# Patient Record
Sex: Male | Born: 1960
Health system: Southern US, Community
[De-identification: ages and names within clinical notes are randomized; demographics above are authoritative.]

## PROBLEM LIST (undated history)

## (undated) DIAGNOSIS — I2699 Other pulmonary embolism without acute cor pulmonale: Secondary | ICD-10-CM

## (undated) DIAGNOSIS — E119 Type 2 diabetes mellitus without complications: Secondary | ICD-10-CM

## (undated) DIAGNOSIS — M179 Osteoarthritis of knee, unspecified: Secondary | ICD-10-CM

## (undated) DIAGNOSIS — M1710 Unilateral primary osteoarthritis, unspecified knee: Secondary | ICD-10-CM

## (undated) DIAGNOSIS — Z8614 Personal history of Methicillin resistant Staphylococcus aureus infection: Secondary | ICD-10-CM

## (undated) DIAGNOSIS — I10 Essential (primary) hypertension: Secondary | ICD-10-CM

## (undated) DIAGNOSIS — R143 Flatulence: Secondary | ICD-10-CM

## (undated) DIAGNOSIS — K219 Gastro-esophageal reflux disease without esophagitis: Secondary | ICD-10-CM

## (undated) DIAGNOSIS — B20 Human immunodeficiency virus [HIV] disease: Secondary | ICD-10-CM

## (undated) DIAGNOSIS — E785 Hyperlipidemia, unspecified: Secondary | ICD-10-CM

## (undated) DIAGNOSIS — I809 Phlebitis and thrombophlebitis of unspecified site: Secondary | ICD-10-CM

## (undated) DIAGNOSIS — Z86718 Personal history of other venous thrombosis and embolism: Secondary | ICD-10-CM

## (undated) DIAGNOSIS — Z21 Asymptomatic human immunodeficiency virus [HIV] infection status: Secondary | ICD-10-CM

## (undated) DIAGNOSIS — I82409 Acute embolism and thrombosis of unspecified deep veins of unspecified lower extremity: Secondary | ICD-10-CM

## (undated) HISTORY — DX: Unilateral primary osteoarthritis, unspecified knee: M17.10

## (undated) HISTORY — PX: ENDOVENOUS ABLATION SAPHENOUS VEIN W/ LASER: SUR449

## (undated) HISTORY — DX: Osteoarthritis of knee, unspecified: M17.9

## (undated) HISTORY — DX: Flatulence: R14.3

## (undated) HISTORY — DX: Acute embolism and thrombosis of unspecified deep veins of unspecified lower extremity: I82.409

## (undated) HISTORY — DX: Asymptomatic human immunodeficiency virus (hiv) infection status: Z21

## (undated) HISTORY — DX: Hyperlipidemia, unspecified: E78.5

## (undated) HISTORY — DX: Essential (primary) hypertension: I10

## (undated) HISTORY — DX: Personal history of Methicillin resistant Staphylococcus aureus infection: Z86.14

## (undated) HISTORY — DX: Personal history of other venous thrombosis and embolism: Z86.718

## (undated) HISTORY — PX: VEIN LIGATION AND STRIPPING: SHX2653

## (undated) HISTORY — DX: Gastro-esophageal reflux disease without esophagitis: K21.9

## (undated) HISTORY — DX: Human immunodeficiency virus (HIV) disease: B20

## (undated) HISTORY — DX: Phlebitis and thrombophlebitis of unspecified site: I80.9

---

## 1996-11-02 ENCOUNTER — Encounter (INDEPENDENT_AMBULATORY_CARE_PROVIDER_SITE_OTHER): Payer: Self-pay | Admitting: *Deleted

## 1996-11-02 LAB — CONVERTED CEMR LAB
CD4 Count: 180 microliters
CD4 T Cell Abs: 180

## 1996-12-03 ENCOUNTER — Encounter (INDEPENDENT_AMBULATORY_CARE_PROVIDER_SITE_OTHER): Payer: Self-pay | Admitting: Infectious Diseases

## 1998-02-01 ENCOUNTER — Encounter: Admission: RE | Admit: 1998-02-01 | Discharge: 1998-02-01 | Payer: Self-pay | Admitting: Infectious Diseases

## 1998-02-15 ENCOUNTER — Encounter: Admission: RE | Admit: 1998-02-15 | Discharge: 1998-02-15 | Payer: Self-pay | Admitting: Infectious Diseases

## 1998-04-14 ENCOUNTER — Encounter: Admission: RE | Admit: 1998-04-14 | Discharge: 1998-04-14 | Payer: Self-pay | Admitting: *Deleted

## 1998-05-26 ENCOUNTER — Encounter: Admission: RE | Admit: 1998-05-26 | Discharge: 1998-05-26 | Payer: Self-pay | Admitting: Infectious Diseases

## 1998-06-09 ENCOUNTER — Encounter: Admission: RE | Admit: 1998-06-09 | Discharge: 1998-06-09 | Payer: Self-pay | Admitting: Infectious Diseases

## 1998-07-19 ENCOUNTER — Ambulatory Visit (HOSPITAL_COMMUNITY): Admission: RE | Admit: 1998-07-19 | Discharge: 1998-07-19 | Payer: Self-pay | Admitting: Infectious Diseases

## 1998-07-19 ENCOUNTER — Encounter: Admission: RE | Admit: 1998-07-19 | Discharge: 1998-07-19 | Payer: Self-pay | Admitting: Infectious Diseases

## 1998-09-08 ENCOUNTER — Encounter: Admission: RE | Admit: 1998-09-08 | Discharge: 1998-09-08 | Payer: Self-pay | Admitting: Infectious Diseases

## 1998-09-22 ENCOUNTER — Encounter: Admission: RE | Admit: 1998-09-22 | Discharge: 1998-09-22 | Payer: Self-pay | Admitting: Infectious Diseases

## 1998-11-08 ENCOUNTER — Encounter: Admission: RE | Admit: 1998-11-08 | Discharge: 1998-11-08 | Payer: Self-pay | Admitting: Infectious Diseases

## 1998-12-27 ENCOUNTER — Ambulatory Visit (HOSPITAL_COMMUNITY): Admission: RE | Admit: 1998-12-27 | Discharge: 1998-12-27 | Payer: Self-pay | Admitting: Infectious Diseases

## 1999-01-10 ENCOUNTER — Encounter: Admission: RE | Admit: 1999-01-10 | Discharge: 1999-01-10 | Payer: Self-pay | Admitting: Infectious Diseases

## 1999-03-14 ENCOUNTER — Ambulatory Visit (HOSPITAL_COMMUNITY): Admission: RE | Admit: 1999-03-14 | Discharge: 1999-03-14 | Payer: Self-pay | Admitting: Hematology and Oncology

## 1999-03-28 ENCOUNTER — Encounter: Admission: RE | Admit: 1999-03-28 | Discharge: 1999-03-28 | Payer: Self-pay | Admitting: Infectious Diseases

## 1999-06-13 ENCOUNTER — Encounter: Admission: RE | Admit: 1999-06-13 | Discharge: 1999-06-13 | Payer: Self-pay | Admitting: Internal Medicine

## 1999-06-13 ENCOUNTER — Ambulatory Visit (HOSPITAL_COMMUNITY): Admission: RE | Admit: 1999-06-13 | Discharge: 1999-06-13 | Payer: Self-pay | Admitting: Infectious Diseases

## 1999-06-27 ENCOUNTER — Encounter: Admission: RE | Admit: 1999-06-27 | Discharge: 1999-06-27 | Payer: Self-pay | Admitting: Infectious Diseases

## 1999-09-05 ENCOUNTER — Ambulatory Visit (HOSPITAL_COMMUNITY): Admission: RE | Admit: 1999-09-05 | Discharge: 1999-09-05 | Payer: Self-pay | Admitting: Infectious Diseases

## 1999-09-05 ENCOUNTER — Encounter: Admission: RE | Admit: 1999-09-05 | Discharge: 1999-09-05 | Payer: Self-pay | Admitting: Infectious Diseases

## 1999-09-21 ENCOUNTER — Encounter: Admission: RE | Admit: 1999-09-21 | Discharge: 1999-09-21 | Payer: Self-pay | Admitting: Infectious Diseases

## 1999-12-02 ENCOUNTER — Ambulatory Visit (HOSPITAL_COMMUNITY): Admission: RE | Admit: 1999-12-02 | Discharge: 1999-12-02 | Payer: Self-pay | Admitting: Infectious Diseases

## 1999-12-02 ENCOUNTER — Encounter: Admission: RE | Admit: 1999-12-02 | Discharge: 1999-12-02 | Payer: Self-pay | Admitting: Infectious Diseases

## 1999-12-19 ENCOUNTER — Encounter: Admission: RE | Admit: 1999-12-19 | Discharge: 1999-12-19 | Payer: Self-pay | Admitting: Infectious Diseases

## 2000-01-04 ENCOUNTER — Encounter: Admission: RE | Admit: 2000-01-04 | Discharge: 2000-01-04 | Payer: Self-pay | Admitting: Infectious Diseases

## 2000-02-06 ENCOUNTER — Ambulatory Visit (HOSPITAL_COMMUNITY): Admission: RE | Admit: 2000-02-06 | Discharge: 2000-02-06 | Payer: Self-pay | Admitting: Infectious Diseases

## 2000-02-20 ENCOUNTER — Encounter: Admission: RE | Admit: 2000-02-20 | Discharge: 2000-02-20 | Payer: Self-pay | Admitting: Infectious Diseases

## 2000-05-11 ENCOUNTER — Ambulatory Visit (HOSPITAL_COMMUNITY): Admission: RE | Admit: 2000-05-11 | Discharge: 2000-05-11 | Payer: Self-pay | Admitting: Infectious Diseases

## 2000-05-11 ENCOUNTER — Encounter: Admission: RE | Admit: 2000-05-11 | Discharge: 2000-05-11 | Payer: Self-pay | Admitting: Infectious Diseases

## 2000-05-28 ENCOUNTER — Encounter: Admission: RE | Admit: 2000-05-28 | Discharge: 2000-05-28 | Payer: Self-pay | Admitting: Infectious Diseases

## 2000-08-06 ENCOUNTER — Ambulatory Visit (HOSPITAL_COMMUNITY): Admission: RE | Admit: 2000-08-06 | Discharge: 2000-08-06 | Payer: Self-pay | Admitting: Infectious Diseases

## 2000-08-06 ENCOUNTER — Encounter: Admission: RE | Admit: 2000-08-06 | Discharge: 2000-08-06 | Payer: Self-pay | Admitting: Infectious Diseases

## 2000-08-20 ENCOUNTER — Encounter: Admission: RE | Admit: 2000-08-20 | Discharge: 2000-08-20 | Payer: Self-pay | Admitting: Infectious Diseases

## 2000-12-24 ENCOUNTER — Ambulatory Visit (HOSPITAL_COMMUNITY): Admission: RE | Admit: 2000-12-24 | Discharge: 2000-12-24 | Payer: Self-pay | Admitting: Infectious Diseases

## 2000-12-24 ENCOUNTER — Encounter: Admission: RE | Admit: 2000-12-24 | Discharge: 2000-12-24 | Payer: Self-pay | Admitting: Infectious Diseases

## 2001-01-14 ENCOUNTER — Encounter: Admission: RE | Admit: 2001-01-14 | Discharge: 2001-01-14 | Payer: Self-pay | Admitting: Infectious Diseases

## 2001-05-20 ENCOUNTER — Encounter: Admission: RE | Admit: 2001-05-20 | Discharge: 2001-05-20 | Payer: Self-pay | Admitting: Infectious Diseases

## 2001-05-20 ENCOUNTER — Ambulatory Visit (HOSPITAL_COMMUNITY): Admission: RE | Admit: 2001-05-20 | Discharge: 2001-05-20 | Payer: Self-pay | Admitting: Infectious Diseases

## 2001-06-10 ENCOUNTER — Encounter: Admission: RE | Admit: 2001-06-10 | Discharge: 2001-06-10 | Payer: Self-pay | Admitting: Infectious Diseases

## 2001-09-03 ENCOUNTER — Ambulatory Visit (HOSPITAL_COMMUNITY): Admission: RE | Admit: 2001-09-03 | Discharge: 2001-09-03 | Payer: Self-pay | Admitting: Infectious Diseases

## 2001-09-20 ENCOUNTER — Encounter: Admission: RE | Admit: 2001-09-20 | Discharge: 2001-09-20 | Payer: Self-pay | Admitting: Infectious Diseases

## 2001-12-10 ENCOUNTER — Encounter: Admission: RE | Admit: 2001-12-10 | Discharge: 2001-12-10 | Payer: Self-pay | Admitting: Internal Medicine

## 2001-12-10 ENCOUNTER — Ambulatory Visit (HOSPITAL_COMMUNITY): Admission: RE | Admit: 2001-12-10 | Discharge: 2001-12-10 | Payer: Self-pay | Admitting: Infectious Diseases

## 2001-12-23 ENCOUNTER — Encounter: Admission: RE | Admit: 2001-12-23 | Discharge: 2001-12-23 | Payer: Self-pay | Admitting: Infectious Diseases

## 2002-02-28 ENCOUNTER — Encounter: Admission: RE | Admit: 2002-02-28 | Discharge: 2002-02-28 | Payer: Self-pay | Admitting: Infectious Diseases

## 2002-03-28 ENCOUNTER — Ambulatory Visit (HOSPITAL_COMMUNITY): Admission: RE | Admit: 2002-03-28 | Discharge: 2002-03-28 | Payer: Self-pay | Admitting: Infectious Diseases

## 2002-03-28 ENCOUNTER — Encounter: Admission: RE | Admit: 2002-03-28 | Discharge: 2002-03-28 | Payer: Self-pay | Admitting: Infectious Diseases

## 2002-04-21 ENCOUNTER — Encounter: Admission: RE | Admit: 2002-04-21 | Discharge: 2002-04-21 | Payer: Self-pay | Admitting: Infectious Diseases

## 2002-07-14 ENCOUNTER — Ambulatory Visit (HOSPITAL_COMMUNITY): Admission: RE | Admit: 2002-07-14 | Discharge: 2002-07-14 | Payer: Self-pay | Admitting: Infectious Diseases

## 2002-07-14 ENCOUNTER — Encounter: Admission: RE | Admit: 2002-07-14 | Discharge: 2002-07-14 | Payer: Self-pay | Admitting: Infectious Diseases

## 2002-07-28 ENCOUNTER — Encounter: Admission: RE | Admit: 2002-07-28 | Discharge: 2002-07-28 | Payer: Self-pay | Admitting: Infectious Diseases

## 2002-11-18 ENCOUNTER — Encounter: Admission: RE | Admit: 2002-11-18 | Discharge: 2002-11-18 | Payer: Self-pay | Admitting: Infectious Diseases

## 2002-12-01 ENCOUNTER — Encounter: Admission: RE | Admit: 2002-12-01 | Discharge: 2002-12-01 | Payer: Self-pay | Admitting: Infectious Diseases

## 2003-03-31 ENCOUNTER — Ambulatory Visit (HOSPITAL_COMMUNITY): Admission: RE | Admit: 2003-03-31 | Discharge: 2003-03-31 | Payer: Self-pay | Admitting: Infectious Diseases

## 2003-03-31 ENCOUNTER — Encounter (INDEPENDENT_AMBULATORY_CARE_PROVIDER_SITE_OTHER): Payer: Self-pay | Admitting: Infectious Diseases

## 2003-03-31 ENCOUNTER — Encounter: Admission: RE | Admit: 2003-03-31 | Discharge: 2003-03-31 | Payer: Self-pay | Admitting: Infectious Diseases

## 2003-04-15 ENCOUNTER — Encounter: Admission: RE | Admit: 2003-04-15 | Discharge: 2003-04-15 | Payer: Self-pay | Admitting: Infectious Diseases

## 2003-07-14 ENCOUNTER — Ambulatory Visit (HOSPITAL_COMMUNITY): Admission: RE | Admit: 2003-07-14 | Discharge: 2003-07-14 | Payer: Self-pay | Admitting: Infectious Diseases

## 2003-07-14 ENCOUNTER — Encounter: Admission: RE | Admit: 2003-07-14 | Discharge: 2003-07-14 | Payer: Self-pay | Admitting: Infectious Diseases

## 2003-07-14 ENCOUNTER — Encounter (INDEPENDENT_AMBULATORY_CARE_PROVIDER_SITE_OTHER): Payer: Self-pay | Admitting: Infectious Diseases

## 2003-07-28 ENCOUNTER — Encounter: Admission: RE | Admit: 2003-07-28 | Discharge: 2003-07-28 | Payer: Self-pay | Admitting: Infectious Diseases

## 2003-10-26 ENCOUNTER — Encounter: Admission: RE | Admit: 2003-10-26 | Discharge: 2003-10-26 | Payer: Self-pay | Admitting: Infectious Diseases

## 2003-11-16 ENCOUNTER — Encounter: Admission: RE | Admit: 2003-11-16 | Discharge: 2003-11-16 | Payer: Self-pay | Admitting: Infectious Diseases

## 2004-02-18 ENCOUNTER — Encounter: Admission: RE | Admit: 2004-02-18 | Discharge: 2004-02-18 | Payer: Self-pay

## 2004-03-15 ENCOUNTER — Ambulatory Visit (HOSPITAL_COMMUNITY): Admission: RE | Admit: 2004-03-15 | Discharge: 2004-03-15 | Payer: Self-pay | Admitting: Infectious Diseases

## 2004-03-15 ENCOUNTER — Encounter: Admission: RE | Admit: 2004-03-15 | Discharge: 2004-03-15 | Payer: Self-pay | Admitting: Infectious Diseases

## 2004-04-11 ENCOUNTER — Encounter: Admission: RE | Admit: 2004-04-11 | Discharge: 2004-04-11 | Payer: Self-pay | Admitting: Infectious Diseases

## 2004-04-14 ENCOUNTER — Encounter: Admission: RE | Admit: 2004-04-14 | Discharge: 2004-04-14 | Payer: Self-pay | Admitting: Infectious Diseases

## 2004-04-21 ENCOUNTER — Encounter: Admission: RE | Admit: 2004-04-21 | Discharge: 2004-04-21 | Payer: Self-pay | Admitting: Diagnostic Radiology

## 2004-05-12 ENCOUNTER — Encounter: Admission: RE | Admit: 2004-05-12 | Discharge: 2004-05-12 | Payer: Self-pay | Admitting: Diagnostic Radiology

## 2004-07-14 ENCOUNTER — Encounter: Admission: RE | Admit: 2004-07-14 | Discharge: 2004-07-14 | Payer: Self-pay

## 2004-08-09 ENCOUNTER — Ambulatory Visit: Payer: Self-pay | Admitting: Infectious Diseases

## 2004-08-09 ENCOUNTER — Ambulatory Visit (HOSPITAL_COMMUNITY): Admission: RE | Admit: 2004-08-09 | Discharge: 2004-08-09 | Payer: Self-pay | Admitting: Infectious Diseases

## 2004-09-07 ENCOUNTER — Ambulatory Visit: Payer: Self-pay | Admitting: Infectious Diseases

## 2004-11-03 ENCOUNTER — Encounter: Admission: RE | Admit: 2004-11-03 | Discharge: 2004-11-03 | Payer: Self-pay

## 2005-01-12 ENCOUNTER — Ambulatory Visit (HOSPITAL_COMMUNITY): Admission: RE | Admit: 2005-01-12 | Discharge: 2005-01-12 | Payer: Self-pay | Admitting: Infectious Diseases

## 2005-01-12 ENCOUNTER — Ambulatory Visit: Payer: Self-pay | Admitting: Infectious Diseases

## 2005-01-30 ENCOUNTER — Ambulatory Visit: Payer: Self-pay | Admitting: Infectious Diseases

## 2005-05-01 ENCOUNTER — Ambulatory Visit (HOSPITAL_COMMUNITY): Admission: RE | Admit: 2005-05-01 | Discharge: 2005-05-01 | Payer: Self-pay | Admitting: Infectious Diseases

## 2005-05-01 ENCOUNTER — Ambulatory Visit: Payer: Self-pay | Admitting: Infectious Diseases

## 2005-05-29 ENCOUNTER — Ambulatory Visit: Payer: Self-pay | Admitting: Infectious Diseases

## 2005-08-28 ENCOUNTER — Ambulatory Visit (HOSPITAL_COMMUNITY): Admission: RE | Admit: 2005-08-28 | Discharge: 2005-08-28 | Payer: Self-pay | Admitting: Infectious Diseases

## 2005-08-28 ENCOUNTER — Encounter (INDEPENDENT_AMBULATORY_CARE_PROVIDER_SITE_OTHER): Payer: Self-pay | Admitting: *Deleted

## 2005-08-28 ENCOUNTER — Ambulatory Visit: Payer: Self-pay | Admitting: Infectious Diseases

## 2005-08-28 LAB — CONVERTED CEMR LAB: CD4 Count: 560 microliters

## 2005-09-04 ENCOUNTER — Ambulatory Visit: Payer: Self-pay | Admitting: Infectious Diseases

## 2005-12-15 ENCOUNTER — Encounter (INDEPENDENT_AMBULATORY_CARE_PROVIDER_SITE_OTHER): Payer: Self-pay | Admitting: *Deleted

## 2005-12-15 ENCOUNTER — Ambulatory Visit: Payer: Self-pay | Admitting: Infectious Diseases

## 2005-12-15 ENCOUNTER — Encounter: Admission: RE | Admit: 2005-12-15 | Discharge: 2005-12-15 | Payer: Self-pay | Admitting: Infectious Diseases

## 2005-12-15 LAB — CONVERTED CEMR LAB
CD4 Count: 540 microliters
HIV 1 RNA Quant: 49 copies/mL

## 2006-01-01 ENCOUNTER — Ambulatory Visit: Payer: Self-pay | Admitting: Infectious Diseases

## 2006-05-22 ENCOUNTER — Encounter (INDEPENDENT_AMBULATORY_CARE_PROVIDER_SITE_OTHER): Payer: Self-pay | Admitting: *Deleted

## 2006-05-22 ENCOUNTER — Encounter: Admission: RE | Admit: 2006-05-22 | Discharge: 2006-05-22 | Payer: Self-pay | Admitting: Infectious Diseases

## 2006-05-22 ENCOUNTER — Ambulatory Visit: Payer: Self-pay | Admitting: Infectious Diseases

## 2006-05-22 LAB — CONVERTED CEMR LAB: HIV 1 RNA Quant: 58 copies/mL

## 2006-06-11 ENCOUNTER — Ambulatory Visit: Payer: Self-pay | Admitting: Infectious Diseases

## 2006-09-15 ENCOUNTER — Emergency Department (HOSPITAL_COMMUNITY): Admission: EM | Admit: 2006-09-15 | Discharge: 2006-09-15 | Payer: Self-pay | Admitting: Emergency Medicine

## 2006-09-18 ENCOUNTER — Ambulatory Visit: Payer: Self-pay | Admitting: Infectious Diseases

## 2006-10-01 ENCOUNTER — Encounter: Admission: RE | Admit: 2006-10-01 | Discharge: 2006-10-01 | Payer: Self-pay | Admitting: Infectious Diseases

## 2006-10-01 ENCOUNTER — Encounter (INDEPENDENT_AMBULATORY_CARE_PROVIDER_SITE_OTHER): Payer: Self-pay | Admitting: *Deleted

## 2006-10-01 ENCOUNTER — Encounter (INDEPENDENT_AMBULATORY_CARE_PROVIDER_SITE_OTHER): Payer: Self-pay | Admitting: Infectious Diseases

## 2006-10-01 ENCOUNTER — Ambulatory Visit: Payer: Self-pay | Admitting: Internal Medicine

## 2006-10-01 LAB — CONVERTED CEMR LAB
CD4 Count: 640 microliters
HIV 1 RNA Quant: 49 copies/mL

## 2006-10-05 ENCOUNTER — Encounter (INDEPENDENT_AMBULATORY_CARE_PROVIDER_SITE_OTHER): Payer: Self-pay | Admitting: Infectious Diseases

## 2006-10-15 ENCOUNTER — Ambulatory Visit: Payer: Self-pay | Admitting: Infectious Diseases

## 2006-11-26 ENCOUNTER — Encounter (INDEPENDENT_AMBULATORY_CARE_PROVIDER_SITE_OTHER): Payer: Self-pay | Admitting: *Deleted

## 2006-11-26 LAB — CONVERTED CEMR LAB

## 2006-12-09 ENCOUNTER — Encounter (INDEPENDENT_AMBULATORY_CARE_PROVIDER_SITE_OTHER): Payer: Self-pay | Admitting: *Deleted

## 2007-01-31 ENCOUNTER — Ambulatory Visit: Payer: Self-pay | Admitting: Infectious Diseases

## 2007-01-31 ENCOUNTER — Encounter: Admission: RE | Admit: 2007-01-31 | Discharge: 2007-01-31 | Payer: Self-pay | Admitting: Infectious Diseases

## 2007-01-31 LAB — CONVERTED CEMR LAB
ALT: 46 units/L (ref 0–53)
AST: 35 units/L (ref 0–37)
Albumin: 4.3 g/dL (ref 3.5–5.2)
Alkaline Phosphatase: 137 units/L — ABNORMAL HIGH (ref 39–117)
BUN: 18 mg/dL (ref 6–23)
Basophils Absolute: 0 10*3/uL (ref 0.0–0.1)
Basophils Relative: 0 % (ref 0–1)
Bilirubin Urine: NEGATIVE
CD4 Count: 760 microliters
CO2: 27 meq/L (ref 19–32)
Calcium: 9 mg/dL (ref 8.4–10.5)
Chloride: 103 meq/L (ref 96–112)
Cholesterol: 197 mg/dL (ref 0–200)
Creatinine, Ser: 0.97 mg/dL (ref 0.40–1.50)
Eosinophils Absolute: 0.2 10*3/uL (ref 0.0–0.7)
Eosinophils Relative: 3 % (ref 0–5)
Glucose, Bld: 102 mg/dL — ABNORMAL HIGH (ref 70–99)
HCT: 42.1 % (ref 39.0–52.0)
HDL: 40 mg/dL (ref >39)
HIV 1 RNA Quant: <50 copies/mL (ref <50)
HIV-1 RNA Quant, Log: <1.7 (ref <1.70)
Hemoglobin, Urine: NEGATIVE
Hemoglobin: 13.9 g/dL (ref 13.0–17.0)
Ketones, ur: NEGATIVE mg/dL
LDL Cholesterol: 134 mg/dL — ABNORMAL HIGH (ref 0–99)
Leukocytes, UA: NEGATIVE
Lymphocytes Relative: 46 % (ref 12–46)
Lymphs Abs: 2.3 10*3/uL (ref 0.7–3.3)
MCHC: 33 g/dL (ref 30.0–36.0)
MCV: 84.2 fL (ref 78.0–100.0)
Monocytes Absolute: 0.3 10*3/uL (ref 0.2–0.7)
Monocytes Relative: 6 % (ref 3–11)
Neutro Abs: 2.2 10*3/uL (ref 1.7–7.7)
Neutrophils Relative %: 44 % (ref 43–77)
Nitrite: NEGATIVE
Platelets: 223 10*3/uL (ref 150–400)
Potassium: 4.2 meq/L (ref 3.5–5.3)
Protein, ur: NEGATIVE mg/dL
RBC: 5 M/uL (ref 4.22–5.81)
RDW: 13.5 % (ref 11.5–14.0)
Sodium: 139 meq/L (ref 135–145)
Specific Gravity, Urine: 1.012 (ref 1.005–1.03)
Total Bilirubin: 0.3 mg/dL (ref 0.3–1.2)
Total CHOL/HDL Ratio: 4.9
Total Protein: 6.4 g/dL (ref 6.0–8.3)
Triglycerides: 115 mg/dL (ref <150)
Urine Glucose: NEGATIVE mg/dL
Urobilinogen, UA: 0.2 (ref 0.0–1.0)
VLDL: 23 mg/dL (ref 0–40)
WBC: 5 10*3/uL (ref 4.0–10.5)
pH: 7 (ref 5.0–8.0)

## 2007-02-05 ENCOUNTER — Telehealth (INDEPENDENT_AMBULATORY_CARE_PROVIDER_SITE_OTHER): Payer: Self-pay | Admitting: Infectious Diseases

## 2007-03-01 DIAGNOSIS — K219 Gastro-esophageal reflux disease without esophagitis: Secondary | ICD-10-CM

## 2007-03-01 DIAGNOSIS — B20 Human immunodeficiency virus [HIV] disease: Secondary | ICD-10-CM

## 2007-03-01 DIAGNOSIS — I10 Essential (primary) hypertension: Secondary | ICD-10-CM

## 2007-03-04 ENCOUNTER — Ambulatory Visit: Payer: Self-pay | Admitting: Infectious Diseases

## 2007-05-16 ENCOUNTER — Telehealth: Payer: Self-pay | Admitting: Internal Medicine

## 2007-05-17 ENCOUNTER — Ambulatory Visit: Payer: Self-pay | Admitting: Internal Medicine

## 2007-05-21 ENCOUNTER — Telehealth: Payer: Self-pay | Admitting: Internal Medicine

## 2007-06-24 ENCOUNTER — Ambulatory Visit: Payer: Self-pay | Admitting: Infectious Disease

## 2007-06-24 ENCOUNTER — Encounter: Admission: RE | Admit: 2007-06-24 | Discharge: 2007-06-24 | Payer: Self-pay | Admitting: Infectious Disease

## 2007-06-24 LAB — CONVERTED CEMR LAB
ALT: 35 units/L (ref 0–53)
AST: 21 units/L (ref 0–37)
Albumin: 4.5 g/dL (ref 3.5–5.2)
Alkaline Phosphatase: 158 units/L — ABNORMAL HIGH (ref 39–117)
BUN: 20 mg/dL (ref 6–23)
Basophils Absolute: 0 10*3/uL (ref 0.0–0.1)
Basophils Relative: 1 % (ref 0–1)
CO2: 26 meq/L (ref 19–32)
Calcium: 9.4 mg/dL (ref 8.4–10.5)
Chloride: 104 meq/L (ref 96–112)
Creatinine, Ser: 0.99 mg/dL (ref 0.40–1.50)
Eosinophils Absolute: 0.1 10*3/uL (ref 0.0–0.7)
Eosinophils Relative: 3 % (ref 0–5)
Glucose, Bld: 99 mg/dL (ref 70–99)
HCT: 46.2 % (ref 39.0–52.0)
HIV 1 RNA Quant: <50 copies/mL (ref <50)
HIV-1 RNA Quant, Log: <1.7 (ref <1.70)
Hemoglobin: 15 g/dL (ref 13.0–17.0)
Lymphocytes Relative: 37 % (ref 12–46)
Lymphs Abs: 1.8 10*3/uL (ref 0.7–3.3)
MCHC: 32.5 g/dL (ref 30.0–36.0)
MCV: 85.6 fL (ref 78.0–100.0)
Monocytes Absolute: 0.4 10*3/uL (ref 0.2–0.7)
Monocytes Relative: 7 % (ref 3–11)
Neutro Abs: 2.6 10*3/uL (ref 1.7–7.7)
Neutrophils Relative %: 53 % (ref 43–77)
Platelets: 206 10*3/uL (ref 150–400)
Potassium: 4.4 meq/L (ref 3.5–5.3)
RBC: 5.4 M/uL (ref 4.22–5.81)
RDW: 13.8 % (ref 11.5–14.0)
Sodium: 141 meq/L (ref 135–145)
Total Bilirubin: 0.3 mg/dL (ref 0.3–1.2)
Total Protein: 7 g/dL (ref 6.0–8.3)
WBC: 4.9 10*3/uL (ref 4.0–10.5)

## 2007-06-26 ENCOUNTER — Encounter (INDEPENDENT_AMBULATORY_CARE_PROVIDER_SITE_OTHER): Payer: Self-pay | Admitting: Infectious Diseases

## 2007-07-03 DIAGNOSIS — Z86718 Personal history of other venous thrombosis and embolism: Secondary | ICD-10-CM

## 2007-07-03 HISTORY — DX: Personal history of other venous thrombosis and embolism: Z86.718

## 2007-07-08 ENCOUNTER — Ambulatory Visit (HOSPITAL_COMMUNITY): Admission: RE | Admit: 2007-07-08 | Discharge: 2007-07-08 | Payer: Self-pay | Admitting: Infectious Disease

## 2007-07-08 ENCOUNTER — Ambulatory Visit: Payer: Self-pay | Admitting: Infectious Disease

## 2007-07-08 ENCOUNTER — Encounter: Payer: Self-pay | Admitting: Infectious Disease

## 2007-07-08 ENCOUNTER — Ambulatory Visit: Payer: Self-pay | Admitting: *Deleted

## 2007-07-08 DIAGNOSIS — I82409 Acute embolism and thrombosis of unspecified deep veins of unspecified lower extremity: Secondary | ICD-10-CM | POA: Insufficient documentation

## 2007-07-11 ENCOUNTER — Ambulatory Visit: Payer: Self-pay | Admitting: Infectious Disease

## 2007-07-11 LAB — CONVERTED CEMR LAB: INR: 2.6

## 2007-07-12 ENCOUNTER — Telehealth: Payer: Self-pay | Admitting: Infectious Disease

## 2007-07-15 ENCOUNTER — Ambulatory Visit: Payer: Self-pay | Admitting: Infectious Diseases

## 2007-07-15 ENCOUNTER — Encounter: Payer: Self-pay | Admitting: Infectious Disease

## 2007-07-15 LAB — CONVERTED CEMR LAB
ALT: 129 units/L — ABNORMAL HIGH (ref 0–53)
AST: 47 units/L — ABNORMAL HIGH (ref 0–37)
Albumin: 4.5 g/dL (ref 3.5–5.2)
Alkaline Phosphatase: 146 units/L — ABNORMAL HIGH (ref 39–117)
BUN: 16 mg/dL (ref 6–23)
CO2: 27 meq/L (ref 19–32)
Calcium: 9.2 mg/dL (ref 8.4–10.5)
Chloride: 104 meq/L (ref 96–112)
Creatinine, Ser: 0.93 mg/dL (ref 0.40–1.50)
Glucose, Bld: 100 mg/dL — ABNORMAL HIGH (ref 70–99)
INR: 5.9
Potassium: 4.1 meq/L (ref 3.5–5.3)
Sodium: 141 meq/L (ref 135–145)
Total Bilirubin: 0.3 mg/dL (ref 0.3–1.2)
Total Protein: 7.1 g/dL (ref 6.0–8.3)

## 2007-07-22 ENCOUNTER — Ambulatory Visit: Payer: Self-pay | Admitting: Infectious Diseases

## 2007-07-22 LAB — CONVERTED CEMR LAB: INR: 2.6

## 2007-07-24 ENCOUNTER — Ambulatory Visit: Payer: Self-pay | Admitting: Infectious Disease

## 2007-07-29 ENCOUNTER — Ambulatory Visit: Payer: Self-pay | Admitting: Infectious Diseases

## 2007-07-29 LAB — CONVERTED CEMR LAB: INR: 2.6

## 2007-08-12 ENCOUNTER — Ambulatory Visit: Payer: Self-pay | Admitting: Internal Medicine

## 2007-08-26 ENCOUNTER — Ambulatory Visit: Payer: Self-pay | Admitting: Internal Medicine

## 2007-08-26 LAB — CONVERTED CEMR LAB: INR: 2.4

## 2007-09-16 ENCOUNTER — Ambulatory Visit: Payer: Self-pay | Admitting: Internal Medicine

## 2007-09-16 LAB — CONVERTED CEMR LAB: INR: 2.1

## 2007-10-02 ENCOUNTER — Encounter (INDEPENDENT_AMBULATORY_CARE_PROVIDER_SITE_OTHER): Payer: Self-pay | Admitting: *Deleted

## 2007-10-14 ENCOUNTER — Ambulatory Visit: Payer: Self-pay | Admitting: Internal Medicine

## 2007-10-14 LAB — CONVERTED CEMR LAB: INR: 4.3

## 2007-11-11 ENCOUNTER — Ambulatory Visit: Payer: Self-pay | Admitting: Hospitalist

## 2007-11-11 ENCOUNTER — Encounter: Admission: RE | Admit: 2007-11-11 | Discharge: 2007-11-11 | Payer: Self-pay | Admitting: Infectious Disease

## 2007-11-11 ENCOUNTER — Ambulatory Visit: Payer: Self-pay | Admitting: Infectious Disease

## 2007-11-11 LAB — CONVERTED CEMR LAB
ALT: 46 units/L (ref 0–53)
AST: 25 units/L (ref 0–37)
Albumin: 4.6 g/dL (ref 3.5–5.2)
Alkaline Phosphatase: 131 units/L — ABNORMAL HIGH (ref 39–117)
BUN: 25 mg/dL — ABNORMAL HIGH (ref 6–23)
Basophils Absolute: 0.1 10*3/uL (ref 0.0–0.1)
Basophils Relative: 1 % (ref 0–1)
CO2: 23 meq/L (ref 19–32)
Calcium: 9.8 mg/dL (ref 8.4–10.5)
Chloride: 104 meq/L (ref 96–112)
Cholesterol: 269 mg/dL — ABNORMAL HIGH (ref 0–200)
Creatinine, Ser: 1.1 mg/dL (ref 0.40–1.50)
Eosinophils Absolute: 0.2 10*3/uL (ref 0.0–0.7)
Eosinophils Relative: 3 % (ref 0–5)
Glucose, Bld: 174 mg/dL — ABNORMAL HIGH (ref 70–99)
HCT: 45 % (ref 39.0–52.0)
HDL: 49 mg/dL (ref >39)
HIV 1 RNA Quant: <50 copies/mL (ref <50)
HIV-1 RNA Quant, Log: <1.7 (ref <1.70)
Hemoglobin: 15 g/dL (ref 13.0–17.0)
INR: 2
LDL Cholesterol: 182 mg/dL — ABNORMAL HIGH (ref 0–99)
Lymphocytes Relative: 40 % (ref 12–46)
Lymphs Abs: 2.1 10*3/uL (ref 0.7–4.0)
MCHC: 33.3 g/dL (ref 30.0–36.0)
MCV: 82.1 fL (ref 78.0–100.0)
Monocytes Absolute: 0.5 10*3/uL (ref 0.1–1.0)
Monocytes Relative: 9 % (ref 3–12)
Neutro Abs: 2.5 10*3/uL (ref 1.7–7.7)
Neutrophils Relative %: 47 % (ref 43–77)
Platelets: 215 10*3/uL (ref 150–400)
Potassium: 4.4 meq/L (ref 3.5–5.3)
RBC: 5.48 M/uL (ref 4.22–5.81)
RDW: 13.5 % (ref 11.5–15.5)
Sodium: 139 meq/L (ref 135–145)
Total Bilirubin: 0.3 mg/dL (ref 0.3–1.2)
Total CHOL/HDL Ratio: 5.5
Total Protein: 7.2 g/dL (ref 6.0–8.3)
Triglycerides: 189 mg/dL — ABNORMAL HIGH (ref <150)
VLDL: 38 mg/dL (ref 0–40)
WBC: 5.4 10*3/uL (ref 4.0–10.5)

## 2007-11-13 ENCOUNTER — Encounter (INDEPENDENT_AMBULATORY_CARE_PROVIDER_SITE_OTHER): Payer: Self-pay | Admitting: *Deleted

## 2007-11-25 ENCOUNTER — Ambulatory Visit: Payer: Self-pay | Admitting: Infectious Disease

## 2007-11-25 DIAGNOSIS — H0019 Chalazion unspecified eye, unspecified eyelid: Secondary | ICD-10-CM | POA: Insufficient documentation

## 2007-11-25 LAB — CONVERTED CEMR LAB

## 2007-12-09 ENCOUNTER — Ambulatory Visit: Payer: Self-pay | Admitting: Infectious Disease

## 2007-12-09 ENCOUNTER — Ambulatory Visit: Payer: Self-pay | Admitting: Infectious Diseases

## 2007-12-09 ENCOUNTER — Ambulatory Visit (HOSPITAL_COMMUNITY): Admission: RE | Admit: 2007-12-09 | Discharge: 2007-12-09 | Payer: Self-pay | Admitting: Infectious Disease

## 2007-12-09 LAB — CONVERTED CEMR LAB: INR: 1.6

## 2007-12-23 ENCOUNTER — Ambulatory Visit: Payer: Self-pay | Admitting: *Deleted

## 2007-12-23 LAB — CONVERTED CEMR LAB: INR: 4.6

## 2008-01-06 ENCOUNTER — Ambulatory Visit: Payer: Self-pay | Admitting: Internal Medicine

## 2008-01-06 LAB — CONVERTED CEMR LAB: INR: 3.7

## 2008-02-20 ENCOUNTER — Ambulatory Visit: Payer: Self-pay | Admitting: Internal Medicine

## 2008-02-20 LAB — CONVERTED CEMR LAB: INR: 2.7

## 2008-03-05 ENCOUNTER — Ambulatory Visit: Payer: Self-pay | Admitting: Infectious Disease

## 2008-03-05 ENCOUNTER — Encounter: Admission: RE | Admit: 2008-03-05 | Discharge: 2008-03-05 | Payer: Self-pay | Admitting: Infectious Disease

## 2008-03-05 LAB — CONVERTED CEMR LAB
ALT: 51 units/L (ref 0–53)
Alkaline Phosphatase: 132 units/L — ABNORMAL HIGH (ref 39–117)
Basophils Absolute: 0 10*3/uL (ref 0.0–0.1)
Eosinophils Absolute: 0.2 10*3/uL (ref 0.0–0.7)
Eosinophils Relative: 4 % (ref 0–5)
HCT: 42.8 % (ref 39.0–52.0)
Lymphocytes Relative: 35 % (ref 12–46)
MCV: 82.9 fL (ref 78.0–100.0)
Neutrophils Relative %: 53 % (ref 43–77)
Platelets: 189 10*3/uL (ref 150–400)
Potassium: 4.3 meq/L (ref 3.5–5.3)
RDW: 13.8 % (ref 11.5–15.5)
Sodium: 139 meq/L (ref 135–145)
Total Bilirubin: 0.3 mg/dL (ref 0.3–1.2)
Total Protein: 7.1 g/dL (ref 6.0–8.3)

## 2008-03-16 ENCOUNTER — Ambulatory Visit: Payer: Self-pay | Admitting: *Deleted

## 2008-03-19 ENCOUNTER — Ambulatory Visit: Payer: Self-pay | Admitting: Infectious Disease

## 2008-03-19 LAB — CONVERTED CEMR LAB
Chlamydia, Swab/Urine, PCR: NEGATIVE
GC Probe Amp, Urine: NEGATIVE

## 2008-04-13 ENCOUNTER — Ambulatory Visit: Payer: Self-pay | Admitting: Infectious Diseases

## 2008-04-13 LAB — CONVERTED CEMR LAB: INR: 2.1

## 2008-04-15 ENCOUNTER — Encounter: Payer: Self-pay | Admitting: Infectious Disease

## 2008-05-11 ENCOUNTER — Ambulatory Visit: Payer: Self-pay | Admitting: Internal Medicine

## 2008-05-11 LAB — CONVERTED CEMR LAB

## 2008-05-15 ENCOUNTER — Encounter: Payer: Self-pay | Admitting: Infectious Disease

## 2008-06-02 DIAGNOSIS — E119 Type 2 diabetes mellitus without complications: Secondary | ICD-10-CM

## 2008-06-02 HISTORY — DX: Type 2 diabetes mellitus without complications: E11.9

## 2008-06-16 ENCOUNTER — Ambulatory Visit: Payer: Self-pay | Admitting: Internal Medicine

## 2008-06-25 ENCOUNTER — Ambulatory Visit: Payer: Self-pay | Admitting: Infectious Disease

## 2008-06-25 LAB — CONVERTED CEMR LAB: HIV-1 RNA Quant, Log: <1.7 (ref <1.70)

## 2008-06-26 ENCOUNTER — Emergency Department (HOSPITAL_COMMUNITY): Admission: EM | Admit: 2008-06-26 | Discharge: 2008-06-26 | Payer: Self-pay | Admitting: Emergency Medicine

## 2008-06-26 ENCOUNTER — Encounter (INDEPENDENT_AMBULATORY_CARE_PROVIDER_SITE_OTHER): Payer: Self-pay | Admitting: *Deleted

## 2008-06-26 LAB — CONVERTED CEMR LAB: C-Peptide: 1.41 ng/mL

## 2008-06-29 ENCOUNTER — Ambulatory Visit: Payer: Self-pay | Admitting: Internal Medicine

## 2008-06-29 ENCOUNTER — Encounter (INDEPENDENT_AMBULATORY_CARE_PROVIDER_SITE_OTHER): Payer: Self-pay | Admitting: *Deleted

## 2008-06-30 LAB — CONVERTED CEMR LAB
BUN: 19 mg/dL (ref 6–23)
CO2: 25 meq/L (ref 19–32)
Cholesterol: 235 mg/dL — ABNORMAL HIGH (ref 0–200)
Creatinine, Ser: 1.21 mg/dL (ref 0.40–1.50)
Glucose, Bld: 443 mg/dL — ABNORMAL HIGH (ref 70–99)
Total Bilirubin: 0.4 mg/dL (ref 0.3–1.2)
Total CHOL/HDL Ratio: 5.5
Total Protein: 6.8 g/dL (ref 6.0–8.3)
Triglycerides: 163 mg/dL — ABNORMAL HIGH (ref <150)
VLDL: 33 mg/dL (ref 0–40)

## 2008-07-03 ENCOUNTER — Encounter: Payer: Self-pay | Admitting: Infectious Disease

## 2008-07-03 ENCOUNTER — Telehealth (INDEPENDENT_AMBULATORY_CARE_PROVIDER_SITE_OTHER): Payer: Self-pay | Admitting: *Deleted

## 2008-07-03 ENCOUNTER — Ambulatory Visit: Payer: Self-pay | Admitting: Internal Medicine

## 2008-07-03 DIAGNOSIS — E1140 Type 2 diabetes mellitus with diabetic neuropathy, unspecified: Secondary | ICD-10-CM

## 2008-07-03 LAB — CONVERTED CEMR LAB: Blood Glucose, Home Monitor: 4 mg/dL

## 2008-07-09 ENCOUNTER — Ambulatory Visit: Payer: Self-pay | Admitting: Infectious Disease

## 2008-07-09 LAB — CONVERTED CEMR LAB: Creatinine, Urine: 163.3 mg/dL

## 2008-07-10 ENCOUNTER — Ambulatory Visit: Payer: Self-pay | Admitting: Infectious Disease

## 2008-07-10 LAB — CONVERTED CEMR LAB
BUN: 18 mg/dL (ref 6–23)
Chloride: 103 meq/L (ref 96–112)
Creatinine, Ser: 1.1 mg/dL (ref 0.40–1.50)

## 2008-07-13 ENCOUNTER — Encounter: Payer: Self-pay | Admitting: Pharmacist

## 2008-07-13 ENCOUNTER — Ambulatory Visit: Payer: Self-pay | Admitting: Internal Medicine

## 2008-07-13 LAB — CONVERTED CEMR LAB: INR: 2

## 2008-07-14 ENCOUNTER — Encounter: Payer: Self-pay | Admitting: Infectious Disease

## 2008-07-15 ENCOUNTER — Telehealth (INDEPENDENT_AMBULATORY_CARE_PROVIDER_SITE_OTHER): Payer: Self-pay | Admitting: *Deleted

## 2008-07-16 ENCOUNTER — Encounter: Payer: Self-pay | Admitting: Infectious Disease

## 2008-07-17 ENCOUNTER — Encounter: Payer: Self-pay | Admitting: Infectious Disease

## 2008-07-17 ENCOUNTER — Ambulatory Visit: Payer: Self-pay | Admitting: Internal Medicine

## 2008-07-24 ENCOUNTER — Ambulatory Visit: Payer: Self-pay | Admitting: Internal Medicine

## 2008-08-04 ENCOUNTER — Ambulatory Visit: Payer: Self-pay | Admitting: Internal Medicine

## 2008-08-05 ENCOUNTER — Telehealth (INDEPENDENT_AMBULATORY_CARE_PROVIDER_SITE_OTHER): Payer: Self-pay | Admitting: *Deleted

## 2008-08-18 ENCOUNTER — Encounter: Payer: Self-pay | Admitting: Infectious Disease

## 2008-08-18 ENCOUNTER — Ambulatory Visit: Payer: Self-pay | Admitting: Internal Medicine

## 2008-08-18 LAB — CONVERTED CEMR LAB
Albumin: 4.3 g/dL (ref 3.5–5.2)
CO2: 25 meq/L (ref 19–32)
Calcium: 9 mg/dL (ref 8.4–10.5)
Chloride: 104 meq/L (ref 96–112)
Cholesterol: 198 mg/dL (ref 0–200)
Glucose, Bld: 95 mg/dL (ref 70–99)
Potassium: 4.8 meq/L (ref 3.5–5.3)
Sodium: 140 meq/L (ref 135–145)
Total Protein: 6.7 g/dL (ref 6.0–8.3)
Triglycerides: 108 mg/dL (ref <150)

## 2008-09-04 ENCOUNTER — Encounter: Payer: Self-pay | Admitting: Infectious Disease

## 2008-09-04 ENCOUNTER — Ambulatory Visit: Payer: Self-pay | Admitting: Internal Medicine

## 2008-09-08 ENCOUNTER — Telehealth (INDEPENDENT_AMBULATORY_CARE_PROVIDER_SITE_OTHER): Payer: Self-pay | Admitting: *Deleted

## 2008-09-09 ENCOUNTER — Encounter (INDEPENDENT_AMBULATORY_CARE_PROVIDER_SITE_OTHER): Payer: Self-pay | Admitting: *Deleted

## 2008-09-28 ENCOUNTER — Encounter: Payer: Self-pay | Admitting: Infectious Disease

## 2008-10-05 ENCOUNTER — Ambulatory Visit: Payer: Self-pay | Admitting: Internal Medicine

## 2008-10-05 ENCOUNTER — Ambulatory Visit: Payer: Self-pay | Admitting: Infectious Disease

## 2008-10-05 LAB — CONVERTED CEMR LAB: HIV 1 RNA Quant: 116 copies/mL — ABNORMAL HIGH (ref <48)

## 2008-10-06 ENCOUNTER — Encounter: Payer: Self-pay | Admitting: Infectious Disease

## 2008-10-06 ENCOUNTER — Ambulatory Visit: Payer: Self-pay | Admitting: Infectious Disease

## 2008-10-06 ENCOUNTER — Ambulatory Visit: Admission: RE | Admit: 2008-10-06 | Discharge: 2008-10-06 | Payer: Self-pay | Admitting: Infectious Disease

## 2008-10-06 ENCOUNTER — Ambulatory Visit: Payer: Self-pay | Admitting: Surgery

## 2008-10-06 LAB — CONVERTED CEMR LAB
AST: 30 units/L (ref 0–37)
Alkaline Phosphatase: 99 units/L (ref 39–117)
BUN: 23 mg/dL (ref 6–23)
Basophils Relative: 1 % (ref 0–1)
Calcium: 9.4 mg/dL (ref 8.4–10.5)
Chloride: 102 meq/L (ref 96–112)
Creatinine, Ser: 1.2 mg/dL (ref 0.40–1.50)
Eosinophils Absolute: 0.1 10*3/uL (ref 0.0–0.7)
Glucose, Bld: 107 mg/dL — ABNORMAL HIGH (ref 70–99)
HDL: 44 mg/dL (ref >39)
Hemoglobin: 16.1 g/dL (ref 13.0–17.0)
Lymphs Abs: 2.1 10*3/uL (ref 0.7–4.0)
MCHC: 33.8 g/dL (ref 30.0–36.0)
MCV: 79.6 fL (ref 78.0–100.0)
Monocytes Absolute: 0.5 10*3/uL (ref 0.1–1.0)
Monocytes Relative: 9 % (ref 3–12)
RBC: 5.99 M/uL — ABNORMAL HIGH (ref 4.22–5.81)
Total CHOL/HDL Ratio: 5.1
Triglycerides: 140 mg/dL (ref <150)

## 2008-10-09 ENCOUNTER — Ambulatory Visit: Payer: Self-pay | Admitting: Internal Medicine

## 2008-10-09 DIAGNOSIS — R252 Cramp and spasm: Secondary | ICD-10-CM | POA: Insufficient documentation

## 2008-10-12 ENCOUNTER — Encounter: Payer: Self-pay | Admitting: Infectious Disease

## 2008-10-19 ENCOUNTER — Ambulatory Visit: Payer: Self-pay | Admitting: Infectious Disease

## 2008-11-05 ENCOUNTER — Encounter (INDEPENDENT_AMBULATORY_CARE_PROVIDER_SITE_OTHER): Payer: Self-pay | Admitting: *Deleted

## 2009-01-12 ENCOUNTER — Telehealth (INDEPENDENT_AMBULATORY_CARE_PROVIDER_SITE_OTHER): Payer: Self-pay | Admitting: Internal Medicine

## 2009-01-12 ENCOUNTER — Telehealth (INDEPENDENT_AMBULATORY_CARE_PROVIDER_SITE_OTHER): Payer: Self-pay | Admitting: *Deleted

## 2009-02-02 ENCOUNTER — Ambulatory Visit: Payer: Self-pay | Admitting: Internal Medicine

## 2009-02-02 ENCOUNTER — Encounter: Payer: Self-pay | Admitting: Infectious Disease

## 2009-02-02 ENCOUNTER — Encounter (INDEPENDENT_AMBULATORY_CARE_PROVIDER_SITE_OTHER): Payer: Self-pay | Admitting: Licensed Clinical Social Worker

## 2009-02-02 LAB — CONVERTED CEMR LAB
ALT: 78 units/L — ABNORMAL HIGH (ref 0–53)
Albumin: 4.4 g/dL (ref 3.5–5.2)
Basophils Absolute: 0 10*3/uL (ref 0.0–0.1)
Basophils Relative: 1 % (ref 0–1)
CO2: 22 meq/L (ref 19–32)
Calcium: 9.1 mg/dL (ref 8.4–10.5)
Chloride: 103 meq/L (ref 96–112)
GFR calc Af Amer: >60 mL/min (ref >60)
GFR calc non Af Amer: >60 mL/min (ref >60)
Glucose, Bld: 114 mg/dL — ABNORMAL HIGH (ref 70–99)
HIV 1 RNA Quant: <48 copies/mL (ref <48)
HIV-1 RNA Quant, Log: <1.68 (ref <1.68)
Lymphocytes Relative: 38 % (ref 12–46)
MCHC: 34.5 g/dL (ref 30.0–36.0)
Neutro Abs: 2.5 10*3/uL (ref 1.7–7.7)
Neutrophils Relative %: 50 % (ref 43–77)
Platelets: 200 10*3/uL (ref 150–400)
RDW: 13.8 % (ref 11.5–15.5)
Sodium: 138 meq/L (ref 135–145)
Total Bilirubin: 0.3 mg/dL (ref 0.3–1.2)
Total Protein: 6.9 g/dL (ref 6.0–8.3)

## 2009-02-15 ENCOUNTER — Ambulatory Visit: Payer: Self-pay | Admitting: Infectious Disease

## 2009-02-15 LAB — CONVERTED CEMR LAB
Cholesterol, target level: 200 mg/dL
LDL Goal: 100 mg/dL

## 2009-03-09 ENCOUNTER — Telehealth: Payer: Self-pay | Admitting: *Deleted

## 2009-03-12 ENCOUNTER — Encounter: Payer: Self-pay | Admitting: Infectious Disease

## 2009-03-12 ENCOUNTER — Ambulatory Visit: Payer: Self-pay | Admitting: Internal Medicine

## 2009-03-12 LAB — CONVERTED CEMR LAB
Alkaline Phosphatase: 87 units/L (ref 39–117)
Blood Glucose, Fingerstick: 81
Cholesterol: 177 mg/dL (ref 0–200)
GFR calc non Af Amer: >60 mL/min (ref >60)
Glucose, Bld: 84 mg/dL (ref 70–99)
Microalb Creat Ratio: 8.7 mg/g (ref 0.0–30.0)
Sodium: 139 meq/L (ref 135–145)
Total Bilirubin: 0.3 mg/dL (ref 0.3–1.2)
Total CHOL/HDL Ratio: 3.5
Total Protein: 7 g/dL (ref 6.0–8.3)

## 2009-03-16 ENCOUNTER — Encounter: Payer: Self-pay | Admitting: Infectious Disease

## 2009-06-08 ENCOUNTER — Ambulatory Visit: Payer: Self-pay | Admitting: Infectious Disease

## 2009-06-08 LAB — CONVERTED CEMR LAB
Albumin: 4.3 g/dL (ref 3.5–5.2)
Alkaline Phosphatase: 96 units/L (ref 39–117)
BUN: 18 mg/dL (ref 6–23)
CO2: 24 meq/L (ref 19–32)
Calcium: 9 mg/dL (ref 8.4–10.5)
Chloride: 106 meq/L (ref 96–112)
Cholesterol: 175 mg/dL (ref 0–200)
Glucose, Bld: 66 mg/dL — ABNORMAL LOW (ref 70–99)
Hemoglobin: 14.5 g/dL (ref 13.0–17.0)
Lymphocytes Relative: 48 % — ABNORMAL HIGH (ref 12–46)
Lymphs Abs: 2.4 10*3/uL (ref 0.7–4.0)
Monocytes Absolute: 0.5 10*3/uL (ref 0.1–1.0)
Monocytes Relative: 9 % (ref 3–12)
Neutro Abs: 1.9 10*3/uL (ref 1.7–7.7)
Potassium: 4.2 meq/L (ref 3.5–5.3)
RBC: 5.28 M/uL (ref 4.22–5.81)
Total CHOL/HDL Ratio: 3.8
Triglycerides: 66 mg/dL (ref <150)
VLDL: 13 mg/dL (ref 0–40)
WBC: 4.9 10*3/uL (ref 4.0–10.5)

## 2009-06-22 ENCOUNTER — Ambulatory Visit: Payer: Self-pay | Admitting: Infectious Disease

## 2009-06-22 LAB — CONVERTED CEMR LAB: GC Probe Amp, Urine: NEGATIVE

## 2009-07-19 ENCOUNTER — Encounter: Payer: Self-pay | Admitting: Infectious Disease

## 2009-07-27 ENCOUNTER — Encounter (INDEPENDENT_AMBULATORY_CARE_PROVIDER_SITE_OTHER): Payer: Self-pay | Admitting: *Deleted

## 2009-07-27 ENCOUNTER — Ambulatory Visit: Payer: Self-pay | Admitting: Internal Medicine

## 2009-07-27 LAB — CONVERTED CEMR LAB
Blood Glucose, Fingerstick: 132
Hgb A1c MFr Bld: 6.1 %

## 2009-11-20 ENCOUNTER — Encounter: Payer: Self-pay | Admitting: Infectious Disease

## 2009-11-29 ENCOUNTER — Ambulatory Visit: Payer: Self-pay | Admitting: Infectious Disease

## 2009-11-29 LAB — CONVERTED CEMR LAB
ALT: 62 units/L — ABNORMAL HIGH (ref 0–53)
AST: 57 units/L — ABNORMAL HIGH (ref 0–37)
Albumin: 4.2 g/dL (ref 3.5–5.2)
BUN: 20 mg/dL (ref 6–23)
Calcium: 9.3 mg/dL (ref 8.4–10.5)
Chloride: 105 meq/L (ref 96–112)
Eosinophils Relative: 3 % (ref 0–5)
HCT: 43.7 % (ref 39.0–52.0)
HIV 1 RNA Quant: 56 copies/mL — ABNORMAL HIGH (ref <48)
HIV-1 RNA Quant, Log: 1.75 — ABNORMAL HIGH (ref <1.68)
Hemoglobin: 14.7 g/dL (ref 13.0–17.0)
Lymphocytes Relative: 43 % (ref 12–46)
Lymphs Abs: 2.3 10*3/uL (ref 0.7–4.0)
Monocytes Relative: 10 % (ref 3–12)
Platelets: 219 10*3/uL (ref 150–400)
Potassium: 4.2 meq/L (ref 3.5–5.3)
RBC: 5.36 M/uL (ref 4.22–5.81)
WBC: 5.4 10*3/uL (ref 4.0–10.5)

## 2009-12-13 ENCOUNTER — Ambulatory Visit: Payer: Self-pay | Admitting: Vascular Surgery

## 2009-12-13 ENCOUNTER — Ambulatory Visit: Payer: Self-pay | Admitting: Infectious Disease

## 2009-12-13 ENCOUNTER — Telehealth (INDEPENDENT_AMBULATORY_CARE_PROVIDER_SITE_OTHER): Payer: Self-pay | Admitting: Internal Medicine

## 2009-12-13 ENCOUNTER — Encounter: Payer: Self-pay | Admitting: Infectious Disease

## 2009-12-13 ENCOUNTER — Ambulatory Visit: Payer: Self-pay | Admitting: Infectious Diseases

## 2009-12-13 ENCOUNTER — Ambulatory Visit (HOSPITAL_COMMUNITY): Admission: RE | Admit: 2009-12-13 | Discharge: 2009-12-13 | Payer: Self-pay | Admitting: Infectious Disease

## 2009-12-13 DIAGNOSIS — H6120 Impacted cerumen, unspecified ear: Secondary | ICD-10-CM | POA: Insufficient documentation

## 2009-12-13 DIAGNOSIS — R609 Edema, unspecified: Secondary | ICD-10-CM

## 2009-12-16 ENCOUNTER — Encounter (INDEPENDENT_AMBULATORY_CARE_PROVIDER_SITE_OTHER): Payer: Self-pay | Admitting: *Deleted

## 2009-12-17 ENCOUNTER — Encounter: Payer: Self-pay | Admitting: Infectious Disease

## 2009-12-17 ENCOUNTER — Telehealth (INDEPENDENT_AMBULATORY_CARE_PROVIDER_SITE_OTHER): Payer: Self-pay | Admitting: *Deleted

## 2009-12-20 ENCOUNTER — Ambulatory Visit: Payer: Self-pay | Admitting: Infectious Disease

## 2009-12-20 LAB — CONVERTED CEMR LAB
Potassium: 4.2 meq/L (ref 3.5–5.3)
Sodium: 138 meq/L (ref 135–145)

## 2010-01-13 ENCOUNTER — Ambulatory Visit: Payer: Self-pay | Admitting: Internal Medicine

## 2010-01-13 LAB — CONVERTED CEMR LAB
Blood Glucose, Fingerstick: 145
Hgb A1c MFr Bld: 6.2 %

## 2010-03-13 ENCOUNTER — Encounter: Payer: Self-pay | Admitting: Internal Medicine

## 2010-03-21 ENCOUNTER — Telehealth (INDEPENDENT_AMBULATORY_CARE_PROVIDER_SITE_OTHER): Payer: Self-pay | Admitting: *Deleted

## 2010-03-23 ENCOUNTER — Ambulatory Visit (HOSPITAL_COMMUNITY): Admission: RE | Admit: 2010-03-23 | Discharge: 2010-03-23 | Payer: Self-pay | Admitting: Internal Medicine

## 2010-03-23 ENCOUNTER — Ambulatory Visit: Payer: Self-pay | Admitting: Internal Medicine

## 2010-03-23 LAB — CONVERTED CEMR LAB

## 2010-04-11 ENCOUNTER — Telehealth: Payer: Self-pay | Admitting: Internal Medicine

## 2010-04-11 ENCOUNTER — Ambulatory Visit: Payer: Self-pay | Admitting: Infectious Disease

## 2010-04-11 ENCOUNTER — Encounter: Payer: Self-pay | Admitting: Internal Medicine

## 2010-04-11 ENCOUNTER — Ambulatory Visit: Payer: Self-pay | Admitting: Internal Medicine

## 2010-04-11 LAB — CONVERTED CEMR LAB: HIV-1 RNA Quant, Log: <1.68 (ref <1.68)

## 2010-04-25 ENCOUNTER — Ambulatory Visit: Payer: Self-pay | Admitting: Infectious Disease

## 2010-04-28 LAB — CONVERTED CEMR LAB
ALT: 55 units/L — ABNORMAL HIGH (ref 0–53)
Albumin: 4.6 g/dL (ref 3.5–5.2)
Alkaline Phosphatase: 110 units/L (ref 39–117)
CO2: 27 meq/L (ref 19–32)
Eosinophils Absolute: 0.2 10*3/uL (ref 0.0–0.7)
Glucose, Bld: 97 mg/dL (ref 70–99)
LDL Cholesterol: 114 mg/dL — ABNORMAL HIGH (ref 0–99)
Lymphocytes Relative: 44 % (ref 12–46)
Lymphs Abs: 1.9 10*3/uL (ref 0.7–4.0)
Neutrophils Relative %: 43 % (ref 43–77)
Platelets: 225 10*3/uL (ref 150–400)
Potassium: 4.5 meq/L (ref 3.5–5.3)
Sodium: 140 meq/L (ref 135–145)
Total Protein: 6.9 g/dL (ref 6.0–8.3)
Triglycerides: 81 mg/dL (ref <150)
WBC: 4.3 10*3/uL (ref 4.0–10.5)

## 2010-07-19 ENCOUNTER — Encounter: Payer: Self-pay | Admitting: Internal Medicine

## 2010-07-21 ENCOUNTER — Ambulatory Visit: Payer: Self-pay | Admitting: Internal Medicine

## 2010-07-21 LAB — CONVERTED CEMR LAB
Blood Glucose, Fingerstick: 154
Hgb A1c MFr Bld: 6.4 %

## 2010-07-25 ENCOUNTER — Encounter: Payer: Self-pay | Admitting: Infectious Disease

## 2010-08-23 ENCOUNTER — Encounter (INDEPENDENT_AMBULATORY_CARE_PROVIDER_SITE_OTHER): Payer: Self-pay | Admitting: *Deleted

## 2010-10-13 ENCOUNTER — Encounter: Payer: Self-pay | Admitting: Infectious Disease

## 2010-10-30 LAB — CONVERTED CEMR LAB
ALT: 54 units/L — ABNORMAL HIGH (ref 0–53)
AST: 30 units/L (ref 0–37)
Albumin: 4.5 g/dL (ref 3.5–5.2)
Alkaline Phosphatase: 135 units/L — ABNORMAL HIGH (ref 39–117)
BUN: 17 mg/dL (ref 6–23)
Basophils Absolute: 0 10*3/uL (ref 0.0–0.1)
Basophils Relative: 1 % (ref 0–1)
CO2: 27 meq/L (ref 19–32)
Calcium: 9.5 mg/dL (ref 8.4–10.5)
Chloride: 104 meq/L (ref 96–112)
Creatinine, Ser: 1 mg/dL (ref 0.40–1.50)
Eosinophils Relative: 2 % (ref 0–5)
Glucose, Bld: 103 mg/dL — ABNORMAL HIGH (ref 70–99)
HCT: 44.1 % (ref 39.0–52.0)
HIV 1 RNA Quant: <50 copies/mL (ref <50)
HIV-1 RNA Quant, Log: <1.7 (ref <1.70)
Hemoglobin: 15.3 g/dL (ref 13.0–17.0)
Lymphocytes Relative: 36 % (ref 12–46)
Lymphs Abs: 1.9 10*3/uL (ref 0.7–3.3)
MCHC: 34.7 g/dL (ref 30.0–36.0)
MCV: 80.9 fL (ref 78.0–100.0)
Monocytes Absolute: 0.4 10*3/uL (ref 0.2–0.7)
Monocytes Relative: 7 % (ref 3–11)
Neutro Abs: 3 10*3/uL (ref 1.7–7.7)
Neutrophils Relative %: 55 % (ref 43–77)
Platelets: 225 10*3/uL (ref 150–400)
Potassium: 4.4 meq/L (ref 3.5–5.3)
RBC: 5.45 M/uL (ref 4.22–5.81)
RDW: 13.1 % (ref 11.5–14.0)
Sodium: 140 meq/L (ref 135–145)
Total Bilirubin: 0.3 mg/dL (ref 0.3–1.2)
Total Protein: 7.2 g/dL (ref 6.0–8.3)
WBC: 5.5 10*3/uL (ref 4.0–10.5)

## 2010-11-01 NOTE — Assessment & Plan Note (Signed)
Summary: DIABETES- TEACHING/CFB   Vital Signs:  Patient profile:   50 year old male Weight:      222.6 pounds BMI:     28.30 Is Patient Diabetic? Yes Did you bring your meter with you today? Yes   Allergies: 1)  ! Augmentin   Complete Medication List: 1)  Triamterene-hctz 75-50 Mg Tabs (Triamterene-hctz) .... 1/2 pill daily 2)  Verapamil Hcl Cr 240 Mg Tbcr (Verapamil hcl) .... Take 1 tablet by mouth once a day 3)  Lisinopril 10 Mg Tabs (Lisinopril) .... Take 1 tablet by mouth once a day 4)  Lancets Fine 28g Misc (Lancets) .... Use to check blood sugars three times daily 5)  Accu-chek Aviva Strp (Glucose blood) .... Use to test blood glucose 4x daily 6)  Accu-chek Softclix Lancets Misc (Lancets) .... Use to test your blood sugar 4x daily 7)  Novolog Flexpen 100 Unit/ml Soln (Insulin aspart) .... Inject mealtime and snacks doses 5 units 15 minutes before meals at 7am, 12 noon, 6 pm and 12 midnight 8)  Pen Needles 31g X 8 Mm Misc (Insulin pen needle) .... Use to inject insulin 4 times daily 9)  Atripla 600-200-300 Mg Tabs (Efavirenz-emtricitab-tenofovir) .... Take 1 tablet by mouth once a day 10)  Anacin 81 Mg Tbec (Aspirin) .... Take 1 tablet by mouth once a day 11)  Fish Oil Concentrate 1000 Mg Caps (Omega-3 fatty acids) .... Take 1 tablet by mouth two times a day 12)  Lantus Solostar 100 Unit/ml Soln (Insulin glargine) .... Inject 23 units subcutaneously once daily 13)  Lipitor 20 Mg Tabs (Atorvastatin calcium) .... Take 1 tablet by mouth once a day 14)  Keto-diastix Strp (Urine glucose-ketones test) .... 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 No Anti-platelet Meds? Yes Comments: see meter download: testing  4 times a day, excellent CBGs with 96% in target, 4 % hypoglycemic- average is 93.4% testing oly before meals, latest A1C is 5.8% at [facility] per [employer]. had increased his insulin to 8-10 units with meals 4 times a day, food recall only reveals  Current Insulin Use   Rapid/Short Insulin Type:Novolog Breakfast Dose: has been 8-10 to reduce  to 5 units  Lunch Dose:had been 8-10, will reduce to 6 units Dinner Dose: had been  8-10, to reduce this to 10 units  Bedtime Dose:had been 8-10, to reduce this to 5 units  Long Acting  Insulin Type:Lantus  Bedtime Dose: 23 units   Insulin to Carb Ratio: 1 unit:10g carb Correction Factor: 1 unit:40mg /dl Meals Coverage: new meal coverage per [employer] to try and test 2 hours afterwards to see if they are appropriate:  Breakfast- 5 units,  lunch 6 units, dinner 10 units, midnight - 5 units for a total fo 26 units a day instead fo the 32-40 he has been taking  Monitoring Self monitoring blood glucose 4 times a day Name of Meter  Accucheck Aviva Measures urine ketones? No  Recent Episodes of: Requiring Help from another person  Hyperglycemia : No Hypoglycemia: Yes Severe Hypoglycemia : No   Wears Medical I.D. Yes Carrys Food for Low Blood sugar Yes Can you tell if your blood sugar is low? Yes   Estimated /Usual Carb  Intake Breakfast # of Carbs/Grams bagel counts as 30grams- suggested he check label,  juice, silk milk- 6 oz, egg and cheese Lunch # of Carbs/Grams sandwich, chips, water  Dinner # of Carbs/Grams 2 cups rice or starch, baked meal, vegetables, bread, diet soda Other # of Carbs/Grams leftovers from dinner - about half of what he has for dinner  Nutrition assessment ETOH : Yes Amount per day: on occassion- discussed incorporating into meal plan safely today What do you look at?                                                                                                                 include this onb ed today- pateint to reveiw and keep records at home na demail  them- test 6x/day before nad 2 hours after for 3 days and include this on food record if possible  Activity Limitations  Inadequate physical activity Would you  say you are physically active: Yes Diabetes Disease Process  Discussed today  Medications State insulin adjustment guidelines: Demonstrates competency    Nutritional Management  Monitoring  Complications State the causes- signs and symptoms and prevention of hypoglycemia: Demonstrates competency   Explain proper treatment of hypoglycemia: Demonstrates competency    Exercise  Lifestyle changes:Goal setting and Problem solving Identify lifestyle behaviors that need to change: Demonstrates competency   Identify risk factors that interfere with health: Demonstrates competency   Develop strategies to reduce risk factors: Demonstrates competency   Diabetes Management Education Done: 12/13/2009    BEHAVIORAL GOAL FOLLOW UP Utilizing medications if for therapeutic effectiveness: decrease mealtime insulin doses as discussed to prevent hypoglycemia and more weight gain Monitoring blood glucose levels daily: check 2 hours after meals for 3 days      will obtain most recent A1C from [facility] so we do not have to repeat.   Diabetes Self Management Support: clinic  and significant other  Follow-up:  6 months or annually per patient desire.

## 2010-11-01 NOTE — Miscellaneous (Signed)
Summary: RW Update  Clinical Lists Changes  Observations: Added new observation of HIV STATUS: CDC-defined AIDS (11/20/2009 10:31)
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
## 2010-11-01 NOTE — Letter (Signed)
Summary: ACC-CHEK  ACC-CHEK   Imported By: Margie Billet 12/15/2009 10:39:20  _____________________________________________________________________  External Attachment:    Type:   Image     Comment:   External Document

## 2010-11-01 NOTE — Progress Notes (Signed)
Summary: new Novolog doses and diabetes testing supplies/dmr  Phone Note Outgoing Call   Call placed by: Jamison Neighbor RD,CDE,  December 13, 2009 3:21 PM Summary of Call: new Novolog doses: 5 units with breakfast, 6 units with Lunch, 10 units with dinner nad 5 units with midnight meal, may adjust acording to post meal check  for a total of 26-35 units/day. also desires to check 6x/day for more precise Novolog dosing  Follow-up for Phone Call        That is fine for now but hopefully once he gets more comfortable w/ his new dose, he can do less frequent checks. Follow-up by: Joaquin Courts  MD,  December 14, 2009 1:57 PM    New/Updated Medications: ACCU-CHEK AVIVA  STRP (GLUCOSE BLOOD) use to test blood glucose 4x-6x daily-dispense 3 months ACCU-CHEK SOFTCLIX LANCETS  MISC (LANCETS) use to test your blood sugar 4x-6x daily NOVOLOG FLEXPEN 100 UNIT/ML SOLN (INSULIN ASPART) 5 units with breakfast, 6 units with Lunch, 10 units with dinner nad 5 units with midnight meal, may adjust acording to post meal check  for a total of 26-35 units/day- Prescriptions: ACCU-CHEK SOFTCLIX LANCETS  MISC (LANCETS) use to test your blood sugar 4x-6x daily  #600 x 3   Entered by:   Jamison Neighbor RD,CDE   Authorized by:   Joaquin Courts  MD   Signed by:   Joaquin Courts  MD on 12/14/2009   Method used:   Electronically to        [facility]* (retail)       [address]       [address] Shipping/mailing       [place], [place]  [address]       Ph: [phone]       Fax: [phone]   RxID:   [id number] ACCU-CHEK AVIVA  STRP (GLUCOSE BLOOD) use to test blood glucose 4x-6x daily-dispense 3 months  #600 x 3   Entered and Authorized by:   Joaquin Courts  MD   Signed by:   Joaquin Courts  MD on 12/14/2009   Method used:   Electronically to        [facility]* (retail)       [address]       [address] Shipping/mailing       [place], [place]  [address]       Ph:  [phone]       Fax: [phone]   RxID:   [id number]

## 2010-11-01 NOTE — Letter (Signed)
Summary: Case Mgt.  Case Mgt.   Imported By: Florinda Marker 05/23/2010 09:25:31  _____________________________________________________________________  External Attachment:    Type:   Image     Comment:   External Document

## 2010-11-01 NOTE — Progress Notes (Signed)
Summary: Diabetes refills/dmr  Phone Note Outgoing Call   Call placed by: Jamison Neighbor RD,CDE,  April 11, 2010 10:11 AM Summary of Call: Patient request 3 month refills be sent to [facility] pharmacy on diabetes supplies and medications    Prescriptions: LANTUS SOLOSTAR 100 UNIT/ML SOLN (INSULIN GLARGINE) inject 23 units subcutaneously once daily Brand medically necessary #2 boxes x 4   Entered by:   Jamison Neighbor RD,CDE   Authorized by:   Laren Everts MD   Signed by:   Laren Everts MD on 04/12/2010   Method used:   Electronically to        [facility]* (retail)       [address]       [address] Shipping/mailing       [place], [place]  [address]       Ph: [phone]       Fax: [phone]   RxID:   [id number] PEN NEEDLES 31G X 8 MM MISC (INSULIN PEN NEEDLE) use to inject insulin 4 times daily  #4 boxes x 4   Entered and Authorized by:   Laren Everts MD   Signed by:   Laren Everts MD on 04/12/2010   Method used:   Electronically to        [facility]* (retail)       [address]       [address] Shipping/mailing       [place], [place]  [address]       Ph: [phone]       Fax: [phone]   RxID:   [id number] NOVOLOG FLEXPEN 100 UNIT/ML SOLN (INSULIN ASPART) 5 units with breakfast, 5 units with Lunch, 5 units with dinner and 5 units with midnight meal, may adjust acording to post meal check  for a total of 26-35 units/day-  #2 x 3   Entered and Authorized by:   Laren Everts MD   Signed by:   Laren Everts MD on 04/12/2010   Method used:   Faxed to ...       [facility]* (retail)       [address]       [address] Shipping/mailing       [place], [place]  [address]       Ph: [phone]       Fax: [phone]   RxID:   [id number] ACCU-CHEK AVIVA  STRP (GLUCOSE BLOOD) use to test blood glucose 4x-6x daily-dispense 3 months   #600 x 4   Entered and Authorized by:   Laren Everts MD   Signed by:   Laren Everts MD on 04/12/2010   Method used:   Electronically to        [facility]* (retail)       [address]       [address] Shipping/mailing       [place], [place]  [address]       Ph: [phone]       Fax: [phone]   RxID:   [id number] ACCU-CHEK SOFTCLIX LANCETS  MISC (LANCETS) use to test your blood sugar 4x-6x daily  #600 x 4   Entered by:   Jamison Neighbor RD,CDE   Authorized by:   Laren Everts MD   Signed by:   Laren Everts MD on 04/12/2010   Method used:   Electronically to  [facility]* (retail)       [address]       [address] Shipping/mailing       [place], [place]  [address]       Ph: [phone]       Fax: [phone]   RxID:   [id number] LANCETS FINE 28G  MISC (LANCETS) use to check blood sugars three times daily  #300 x 4   Entered and Authorized by:   Laren Everts MD   Signed by:   Laren Everts MD on 04/12/2010   Method used:   Electronically to        [facility]* (retail)       [address]       [address] Shipping/mailing       [place], [place]  [address]       Ph: [phone]       Fax: [phone]   RxID:   [id number]   Appended Document: Diabetes refills/dmr emailed patient and informed him  that diabetes prescriptions have been sent.

## 2010-11-01 NOTE — Letter (Signed)
Summary: BLOOD GLUCOSE 03-13-2010-04-11-2010  BLOOD GLUCOSE 03-13-2010-04-11-2010   Imported By: Margie Billet 04/11/2010 11:14:20  _____________________________________________________________________  External Attachment:    Type:   Image     Comment:   External Document

## 2010-11-01 NOTE — Progress Notes (Signed)
Summary: phone/gg    Phone Note Call from Patient   Caller: Patient Summary of Call: Pt called stating  yesterday was out in yard and pulled  a muscle in back while lifting branches.  He is taking IBU with relief but pain returns. He is requesting muscle relaxent.  He denies any radiation of pain.  I told him he needs to be seen.  Scheduled for Wed. but pt advised to be evauluted if any changes in Sx. Initial call taken by: Merrie Roof RN,  March 21, 2010 10:03 AM  Follow-up for Phone Call        Agree with your rec for Wed appt.  Pls advice pt to place ice for 20 min Q HR.  Can use combo of tylenol and ibuprofen for discomfort.  Use this combo for three days only until appt.   Follow-up by: Blanch Media MD,  March 21, 2010 10:40 AM  Additional Follow-up for Phone Call Additional follow up Details #1::        Pt informed and voices understanding Additional Follow-up by: Merrie Roof RN,  March 21, 2010 10:44 AM

## 2010-11-01 NOTE — Assessment & Plan Note (Signed)
Summary: CHECKUP/SB.   Vital Signs:  Patient profile:   50 year old male Height:      74.5 inches (189.23 cm) Weight:      215.6 pounds (98.00 kg) BMI:     27.41 Temp:     97.6 degrees F (36.44 degrees C) oral Pulse rate:   75 / minute BP sitting:   132 / 85  (right arm)  Vitals Entered By: Stanton Kidney Ditzler RN (July 21, 2010 9:26 AM) Is Patient Diabetic? Yes Did you bring your meter with you today? No Pain Assessment Patient in pain? no      Nutritional Status BMI of 25 - 29 = overweight Nutritional Status Detail appetite good CBG Result 154  Have you ever been in a relationship where you felt threatened, hurt or afraid?denies   Does patient need assistance? Functional Status Self care Ambulation Normal Comments Ck-up and met Dr Cena Benton.   Primary Care Provider:  Laren Everts MD   History of Present Illness: 50 yr old man with pmhx as described below comes to the clinic for follow up. Patient has no complains.   Had Colonoscopy about a year ago and it was normal. Diabetic eye exam done 2 days ago; reports that exam was normal.   Depression History:      The patient denies a depressed mood most of the day and a diminished interest in his usual daily activities.         Preventive Screening-Counseling & Management  Alcohol-Tobacco     Alcohol type: occassional     Smoking Status: quit     Smoking Cessation Counseling: yes     Packs/Day: 5 ciggs     Year Quit: 2008     Passive Smoke Exposure: no  Caffeine-Diet-Exercise     Caffeine use/day: 0     Does Patient Exercise: yes     Type of exercise: walking     Times/week: 3-4  Problems Prior to Update: 1)  Back Pain  (ICD-724.5) 2)  Cerumen Impaction, Right  (ICD-380.4) 3)  Edema Leg  (ICD-782.3) 4)  Leg Cramps, Nocturnal  (ICD-729.82) 5)  Preventive Health Care  (ICD-V70.0) 6)  Degenerative Joint Disease, Knee  (ICD-715.96) 7)  Hyperlipidemia  (ICD-272.4) 8)  Dm  (ICD-250.00) 9)  Chalazion   (ICD-373.2) 10)  Dvt  (ICD-453.40) 11)  Mrsa Infection,probable  (ICD-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 1/2 Pill Daily 2)  Verapamil Hcl Cr 240 Mg Tbcr (Verapamil Hcl) .... Take 1 Tablet By Mouth Once A Day 3)  Lancets Fine 28g  Misc (Lancets) .... Use To Check Blood Sugars Three Times Daily 4)  Accu-Chek Aviva  Strp (Glucose Blood) .... Use To Test Blood Glucose 4x-6x Daily-Dispense 3 Months 5)  Accu-Chek Softclix Lancets  Misc (Lancets) .... Use To Test Your Blood Sugar 4x-6x Daily 6)  Novolog Flexpen 100 Unit/ml Soln (Insulin Aspart) .... 5 Units With Breakfast, 5 Units With Lunch, 5 Units With Dinner and 5 Units With Midnight Meal, May Adjust Acording To Post Meal Check  For A Total of 26-35 Units/day- 7)  Pen Needles 31g X 8 Mm Misc (Insulin Pen Needle) .... Use To Inject Insulin 4 Times Daily 8)  Atripla 600-200-300 Mg Tabs (Efavirenz-Emtricitab-Tenofovir) .... Take 1 Tablet By Mouth Once A Day 9)  Anacin 81 Mg Tbec (Aspirin) .... Take 1 Tablet By Mouth Once  A Day 10)  Fish Oil Concentrate 1000 Mg Caps (Omega-3 Fatty Acids) .... Take 1 Tablet By Mouth Two Times A Day 11)  Lantus Solostar 100 Unit/ml Soln (Insulin Glargine) .... Inject 23 Units Subcutaneously Once Daily 12)  Keto-Diastix  Strp (Urine Glucose-Ketones Test) .... Use To Check Ketones When Blood Sugar Higher Than 240 Mg/dl, Call If Ever Moderate To Large 13)  Lisinopril 20 Mg Tabs (Lisinopril) .... Take 1 Tablet By Mouth Once A Day 14)  Flexeril 10 Mg Tabs (Cyclobenzaprine Hcl) .... Take 1 Tablet 3 Times Per Day As Needed For Pain 15)  Lidoderm 5 % Ptch (Lidocaine) .... Apply Every 12 Hours As Needed For Pain 16)  Crestor 20 Mg Tabs (Rosuvastatin Calcium) .... Take 1 Tablet By Mouth Once A Day (To Replace Lipitor)  Current Medications (verified): 1)  Triamterene-Hctz 75-50 Mg  Tabs (Triamterene-Hctz) .... 1/2 Pill Daily 2)  Verapamil Hcl Cr 240 Mg Tbcr (Verapamil Hcl) .... Take 1 Tablet By Mouth Once A Day 3)  Lancets Fine 28g  Misc (Lancets) .... Use To Check Blood Sugars Three Times Daily 4)  Accu-Chek Aviva  Strp (Glucose Blood) .... Use To Test Blood Glucose 4x-6x Daily-Dispense 3 Months 5)  Accu-Chek Softclix Lancets  Misc (Lancets) .... Use To Test Your Blood Sugar 4x-6x Daily 6)  Novolog Flexpen 100 Unit/ml Soln (Insulin Aspart) .... 5 Units With Breakfast, 5 Units With Lunch, 5 Units With Dinner and 5 Units With Midnight Meal, May Adjust Acording To Post Meal Check  For A Total of 26-35 Units/day- 7)  Pen Needles 31g X 8 Mm Misc (Insulin Pen Needle) .... Use To Inject Insulin 4 Times Daily 8)  Atripla 600-200-300 Mg Tabs (Efavirenz-Emtricitab-Tenofovir) .... Take 1 Tablet By Mouth Once A Day 9)  Anacin 81 Mg Tbec (Aspirin) .... Take 1 Tablet By Mouth Once A Day 10)  Fish Oil Concentrate 1000 Mg Caps (Omega-3 Fatty Acids) .... Take 1 Tablet By Mouth Two Times A Day 11)  Lantus Solostar 100 Unit/ml Soln (Insulin Glargine) .... Inject 23 Units Subcutaneously Once Daily 12)  Keto-Diastix  Strp (Urine Glucose-Ketones Test) .... Use To Check Ketones When Blood Sugar Higher Than 240 Mg/dl, Call If Ever Moderate To Large 13)  Lisinopril 20 Mg Tabs (Lisinopril) .... Take 1 Tablet By Mouth Once A Day 14)  Flexeril 10 Mg Tabs (Cyclobenzaprine Hcl) .... Take 1 Tablet 3 Times Per Day As Needed For Pain 15)  Lidoderm 5 % Ptch (Lidocaine) .... Apply Every 12 Hours As Needed For Pain 16)  Crestor 20 Mg Tabs (Rosuvastatin Calcium) .... Take 1 Tablet By Mouth Once A Day (To Replace Lipitor)  Allergies: 1)  ! 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    Risk Factors: Caffeine Use: 0 (07/21/2010) Exercise: yes (07/21/2010)  Risk Factors: Smoking Status: quit (07/21/2010) Packs/Day: 5 ciggs (07/21/2010) Passive Smoke Exposure: no (07/21/2010)  Family History: Reviewed history from 03/19/2008 and no changes required. no early cad  Social History: Reviewed history from 03/12/2009 and no changes required.  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    Review of Systems  The patient denies fever, chest pain, dyspnea on exertion, hemoptysis, abdominal pain, melena, hematochezia, hematuria, muscle weakness, and difficulty walking.    Physical Exam  General:  NAD Mouth:  MMM Neck:  no LAD Lungs:  CTA bilaterally, normal resp effort.  Heart:  RRR, no m/r/g.  Abdomen:  +BS's, soft, NT and ND.   Msk:  normal ROM.   Extremities:  No edema Neurologic:  alert & oriented X3, strength normal in all extremities, and gait normal.     Impression & Recommendations:  Problem # 1:  DM (ICD-250.00) Foot exam performed today. Continue current regimen. Diabetic Eye exam done 2 days ago.   His updated medication list for this problem includes:    Novolog Flexpen 100 Unit/ml Soln (Insulin aspart) .Marland KitchenMarland KitchenMarland KitchenMarland Kitchen 5 units with breakfast, 5 units with lunch, 5 units with dinner and 5 units with midnight meal, may adjust acording to post meal check  for a total of 26-35 units/day-    Anacin 81 Mg Tbec (Aspirin) .Marland Kitchen... Take 1 tablet by mouth once a day    Lantus Solostar 100 Unit/ml Soln (Insulin glargine) ..... Inject 23 units  subcutaneously once daily    Lisinopril 20 Mg Tabs (Lisinopril) .Marland Kitchen... Take 1 tablet by mouth once a day  Orders: T- Capillary Blood Glucose ([address]) T-Hgb A1C (in-house) (60454UJ)  Labs Reviewed: Creat: 1.07 (04/11/2010)     Last Eye Exam: No diabetic retinopathy.    (07/19/2009) Reviewed HgBA1c results: 6.4 (07/21/2010)  6.3 (04/25/2010)  Problem # 2:  HYPERLIPIDEMIA (ICD-272.4) Continue current regimen. Recheck FLP in 6 months and reasses need to increase Crestor as LDL not at goal <100.  His updated medication list for this problem includes:    Crestor 20 Mg Tabs (Rosuvastatin calcium) .Marland Kitchen... Take 1 tablet by mouth once a day (to replace lipitor)  Labs Reviewed: SGOT: 42 (04/11/2010)   SGPT: 55 (04/11/2010)  Lipid Goals: Chol Goal: 200 (02/15/2009)   HDL Goal: 40 (02/15/2009)   LDL Goal: 100 (02/15/2009)   TG Goal: 150 (02/15/2009)  Prior 10 Yr Risk Heart Disease: 14 % (04/25/2010)   HDL:45 (04/11/2010), 45 (11/29/2009)  LDL:114 (04/11/2010), 114 (11/29/2009)  Chol:175 (04/11/2010), 174 (11/29/2009)  Trig:81 (04/11/2010), 76 (11/29/2009)  Problem # 3:  HYPERTENSION (ICD-401.9) Continue current regimen.   His updated medication list for this problem includes:    Triamterene-hctz 75-50 Mg Tabs (Triamterene-hctz) .Marland Kitchen... 1/2 pill daily    Verapamil Hcl Cr 240 Mg Tbcr (Verapamil hcl) .Marland Kitchen... Take 1 tablet by mouth once a day    Lisinopril 20 Mg Tabs (Lisinopril) .Marland Kitchen... Take 1 tablet by mouth once a day  BP today: 132/85 Prior BP: 129/86 (04/25/2010)  Prior 10 Yr Risk Heart Disease: 14 % (04/25/2010)  Labs Reviewed: K+: 4.5 (04/11/2010) Creat: : 1.07 (04/11/2010)   Chol: 175 (04/11/2010)   HDL: 45 (04/11/2010)   LDL: 114 (04/11/2010)   TG: 81 (04/11/2010)  Problem # 4:  HIV DISEASE (ICD-042) Per ID.  Complete Medication List: 1)  Triamterene-hctz 75-50 Mg Tabs (Triamterene-hctz) .... 1/2 pill daily 2)  Verapamil Hcl Cr 240 Mg Tbcr (Verapamil hcl) .... Take 1 tablet by mouth  once a day 3)  Lancets Fine 28g Misc (Lancets) .... Use to check blood sugars three times daily 4)  Accu-chek Aviva Strp (Glucose blood) .... Use to test blood glucose 4x-6x daily-dispense  3 months 5)  Accu-chek Softclix Lancets Misc (Lancets) .... Use to test your blood sugar 4x-6x daily 6)  Novolog Flexpen 100 Unit/ml Soln (Insulin aspart) .... 5 units with breakfast, 5 units with lunch, 5 units with dinner and 5 units with midnight meal, may adjust acording to post meal check  for a total of 26-35 units/day- 7)  Pen Needles 31g X 8 Mm Misc (Insulin pen needle) .... Use to inject insulin 4 times daily 8)  Atripla 600-200-300 Mg Tabs (Efavirenz-emtricitab-tenofovir) .... Take 1 tablet by mouth once a day 9)  Anacin 81 Mg Tbec (Aspirin) .... Take 1 tablet by mouth once a day 10)  Fish Oil Concentrate 1000 Mg Caps (Omega-3 fatty acids) .... Take 1 tablet by mouth two times a day 11)  Lantus Solostar 100 Unit/ml Soln (Insulin glargine) .... Inject 23 units subcutaneously once daily 12)  Keto-diastix Strp (Urine glucose-ketones test) .... Use to check ketones when blood sugar higher than 240 mg/dl, call if ever moderate to large 13)  Lisinopril 20 Mg Tabs (Lisinopril) .... Take 1 tablet by mouth once a day 14)  Flexeril 10 Mg Tabs (Cyclobenzaprine hcl) .... Take 1 tablet 3 times per day as needed for pain 15)  Lidoderm 5 % Ptch (Lidocaine) .... Apply every 12 hours as needed for pain 16)  Crestor 20 Mg Tabs (Rosuvastatin calcium) .... Take 1 tablet by mouth once a day (to replace lipitor)  Patient Instructions: 1)  Please schedule a follow-up appointment in 6 months. 2)  Take all medication as directed.    Orders Added: 1)  T- Capillary Blood Glucose [82948] 2)  T-Hgb A1C (in-house) [83036QW] 3)  Est. Patient Level III [[address]]    Prevention & Chronic Care Immunizations   Influenza vaccine: Fluvax Non-MCR  (07/08/2007)   Influenza vaccine deferral: Not indicated  (03/23/2010)   Influenza  vaccine due: 06/02/2010    Tetanus booster: Not documented   Td booster deferral: Not indicated  (03/23/2010)    Pneumococcal vaccine: Historical  (10/09/2006)  Colorectal Screening   Hemoccult: Not documented   Hemoccult action/deferral: Not indicated  (03/23/2010)    Colonoscopy: Not documented   Colonoscopy action/deferral: Not indicated  (03/23/2010)  Other Screening   PSA: Not documented   PSA action/deferral: Not indicated  (03/23/2010)   Smoking status: quit  (07/21/2010)  Diabetes Mellitus   HgbA1C: 6.4  (07/21/2010)   HgbA1C action/deferral: Ordered  (07/27/2009)    Eye exam: No diabetic retinopathy.     (07/19/2009)   Diabetic eye exam action/deferral: Ophthalmology referral  (07/21/2010)   Eye exam due: 08/2010    Foot exam: yes  (04/11/2010)   Foot exam action/deferral: Do today   High risk foot: No  (04/11/2010)   Foot care education: Done  (01/13/2010)   Foot exam due: 04/12/2011    Urine microalbumin/creatinine ratio: 8.7  (03/12/2009)    Diabetes flowsheet reviewed?: Yes   Progress toward A1C goal: At goal  Lipids   Total Cholesterol: 175  (04/11/2010)   LDL: 114  (04/11/2010)   LDL Direct: Not documented   HDL: 45  (04/11/2010)   Triglycerides: 81  (04/11/2010)    SGOT (AST): 42  (04/11/2010)   SGPT (ALT): 55  (04/11/2010)   Alkaline phosphatase: 110  (04/11/2010)   Total bilirubin: 0.4  (04/11/2010)    Lipid flowsheet reviewed?: Yes   Progress toward LDL goal: Unchanged  Hypertension   Last Blood Pressure: 132 / 85  (07/21/2010)   Serum creatinine: 1.07  (  04/11/2010)   Serum potassium 4.5  (04/11/2010)    Hypertension flowsheet reviewed?: Yes   Progress toward BP goal: Unchanged  Self-Management Support :   Personal Goals (by the next clinic visit) :     Personal A1C goal: 7  (01/13/2010)     Personal blood pressure goal: 130/80  (01/13/2010)     Personal LDL goal: 100  (01/13/2010)    Patient will work on the following items  until the next clinic visit to reach self-care goals:     Medications and monitoring: take my medicines every day, check my blood sugar, check my blood pressure, bring all of my medications to every visit, weigh myself weekly, examine my feet every day  (07/21/2010)     Eating: drink diet soda or water instead of juice or soda, eat more vegetables, use fresh or frozen vegetables, eat foods that are low in salt, eat baked foods instead of fried foods, eat fruit for snacks and desserts, limit or avoid alcohol  (07/21/2010)     Activity: take a 30 minute walk every day, park at the far end of the parking lot  (07/21/2010)    Diabetes self-management support: Written self-care plan, Education handout, Resources for patients handout  (07/21/2010)   Diabetes care plan printed   Diabetes education handout printed   Last diabetes self-management training by diabetes educator: 04/04/2010   Last medical nutrition therapy: 10/05/2008    Hypertension self-management support: Written self-care plan, Education handout, Resources for patients handout  (07/21/2010)   Hypertension self-care plan printed.   Hypertension education handout printed    Lipid self-management support: Written self-care plan, Education handout, Resources for patients handout  (07/21/2010)   Lipid self-care plan printed.   Lipid education handout printed      Resource handout printed.   Nursing Instructions:     Laboratory Results   Blood Tests   Date/Time Received: July 21, 2010 9:39 AM  Date/Time Reported: Burke Keels  July 21, 2010 9:39 AM   HGBA1C: 6.4%   (Normal Range: Non-Diabetic - 3-6%   Control Diabetic - 6-8%) CBG Random:: 154mg /dL      Last LDL:                                                 114 (04/11/2010 7:35:00 PM)      Diabetic Foot Exam Last Podiatry Exam Date: 01/13/2010 Comments: Pt left before cking feet - worked [employer]  last night. Stanton Kidney Ditzler RN

## 2010-11-01 NOTE — Miscellaneous (Signed)
  Clinical Lists Changes 

## 2010-11-01 NOTE — Assessment & Plan Note (Signed)
Summary: CHECKUP/SB.   Vital Signs:  Patient profile:   50 year old male Height:      74.5 inches (189.23 cm) Weight:      217.2 pounds (98.73 kg) BMI:     27.61 Temp:     97.1 degrees F oral Pulse rate:   68 / minute BP sitting:   123 / 78  (right arm)  Vitals Entered By: Chinita Pester RN (January 13, 2010 8:39 AM) CC: F/U visit;no c/o's. Is Patient Diabetic? Yes Did you bring your meter with you today? Yes Pain Assessment Patient in pain? no      Nutritional Status BMI of 25 - 29 = overweight CBG Result 145  Have you ever been in a relationship where you felt threatened, hurt or afraid?No   Does patient need assistance? Functional Status Self care Ambulation Normal   Diabetic Foot Exam Last Podiatry Exam Date: 01/13/2010  Foot Inspection Is there a history of a foot ulcer?              No Is there a foot ulcer now?              No Can the patient see the bottom of their feet?          Yes Are the shoes appropriate in style and fit?          Yes Is there swelling or an abnormal foot shape?          No Are the toenails long?                No Are the toenails thick?                No Are the toenails ingrown?              No Is there heavy callous build-up?              No  Diabetic Foot Care Education Patient educated on appropriate care of diabetic feet.  Pulse Check          Right Foot          Left Foot Dorsalis Pedis:        normal            normal    10-g (5.07) Semmes-Weinstein Monofilament Test Performed by: Chinita Pester RN          Right Foot          Left Foot Visual Inspection     normal          Test Control      normal         normal Site 1         normal         normal Site 2         normal         normal Site 3         normal         normal Site 4         normal         normal Site 5         normal         normal Site 6         normal         normal Site 7         normal  normal Site 8         normal         normal Site 9          normal         normal   Primary Care Provider:  Paulette Blanch Dam MD  CC:  F/U visit;no c/o's..  History of Present Illness: Pt is 50 yo male w/ past med hx below here for routine f/u.  He is overall doing very well.  He is having surgery on his R eyelid soon.  He is pleased with how well his diabetes seems to be doing.  He was having some lows before but has adjusted his insulin and doing better.  His blood pressure and HLD meds were increased at his last visist and he tolerated this without any problems.  Depression History:      The patient denies a depressed mood most of the day and a diminished interest in his usual daily activities.         Preventive Screening-Counseling & Management  Alcohol-Tobacco     Alcohol type: occassional     Smoking Status: quit     Smoking Cessation Counseling: yes     Packs/Day: 5 ciggs     Year Quit: 2008     Passive Smoke Exposure: no  Caffeine-Diet-Exercise     Caffeine use/day: 0     Does Patient Exercise: yes     Type of exercise: walking     Times/week: 3-4  Current Medications (verified): 1)  Triamterene-Hctz 75-50 Mg Tabs (Triamterene-Hctz) .... 1/2 Pill Daily 2)  Verapamil Hcl Cr 240 Mg Tbcr (Verapamil Hcl) .... Take 1 Tablet By Mouth Once A Day 3)  Lancets Fine 28g  Misc (Lancets) .... Use To Check Blood Sugars Three Times Daily 4)  Accu-Chek Aviva  Strp (Glucose Blood) .... Use To Test Blood Glucose 4x-6x Daily-Dispense 3 Months 5)  Accu-Chek Softclix Lancets  Misc (Lancets) .... Use To Test Your Blood Sugar 4x-6x Daily 6)  Novolog Flexpen 100 Unit/ml Soln (Insulin Aspart) .... 5 Units With Breakfast, 5 Units With Lunch, 5 Units With Dinner and 5 Units With Midnight Meal, May Adjust Acording To Post Meal Check  For A Total of 26-35 Units/day- 7)  Pen Needles 31g X 8 Mm Misc (Insulin Pen Needle) .... Use To Inject Insulin 4 Times Daily 8)  Atripla 600-200-300 Mg Tabs (Efavirenz-Emtricitab-Tenofovir) .... Take 1 Tablet By Mouth Once A  Day 9)  Anacin 81 Mg Tbec (Aspirin) .... Take 1 Tablet By Mouth Once A Day 10)  Fish Oil Concentrate 1000 Mg Caps (Omega-3 Fatty Acids) .... Take 1 Tablet By Mouth Two Times A Day 11)  Lantus Solostar 100 Unit/ml Soln (Insulin Glargine) .... Inject 23 Units Subcutaneously Once Daily 12)  Keto-Diastix  Strp (Urine Glucose-Ketones Test) .... Use To Check Ketones When Blood Sugar Higher Than 240 Mg/dl, Call If Ever Moderate To Large 13)  Lisinopril 20 Mg Tabs (Lisinopril) .... Take 1 Tablet By Mouth Once A Day 14)  Lipitor 40 Mg Tabs (Atorvastatin Calcium) .... Take 1 Tablet By Mouth Once A Day  Allergies (verified): 1)  ! 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    Risk Factors: Smoking Status: quit (01/13/2010) Packs/Day: 5 ciggs (01/13/2010) Passive Smoke Exposure: no (01/13/2010)  Social History: Reviewed history from 03/12/2009 and no changes required. 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    Review of Systems       as per hpi.   Physical Exam  General:  alert, oriented, appropriately groomed, appears his stated age.  Eyes:  anicteric, R eye lid w/ chalazion. Mouth:  MMM, fair dentition.  Lungs:  CTA bilaterally, normal resp effort.  Heart:  RRR, no m/r/g.  Abdomen:  +BS's, soft, NT and ND.  Quarter sized ecchymosis in the R lower quadrant at site of insulin injections.  Pulses:  2+  DP pulses bilaterally.  Extremities:  1-2 + pitting edema of the R LE-chronic, w/ chronic varicosities and venous stasis changes bilaterally.  Neurologic:  gait normal.  Cervical Nodes:  No lymphadenopathy noted Axillary Nodes:  No palpable lymphadenopathy Psych:  mood euthymic.   Impression & Recommendations:  Problem # 1:  DM (ICD-250.00) A1c at goal and he is doing a great job. Cont ACE I, lipids increased recently for goal LDL < 100 and will recheck at his f/u visit, up to date on eye exam and foot exam.  His updated medication list for this problem includes:    Novolog Flexpen 100 Unit/ml Soln (Insulin aspart) .Marland KitchenMarland KitchenMarland KitchenMarland Kitchen 5 units with breakfast, 5 units with lunch, 5 units with dinner and 5 units with midnight meal, may adjust acording to post meal check  for a total of 26-35 units/day-    Anacin 81 Mg Tbec (Aspirin) .Marland Kitchen... Take 1 tablet by mouth once a day    Lantus Solostar 100 Unit/ml Soln (Insulin glargine) ..... Inject 23 units subcutaneously once daily    Lisinopril 20 Mg Tabs (Lisinopril) .Marland Kitchen... Take 1 tablet by mouth once a day  Orders: T- Capillary Blood Glucose (16109) T-Hgb A1C (in-house) ([id number])  Labs Reviewed: Creat: 1.07 (12/20/2009)     Last Eye Exam: No diabetic retinopathy.    (07/19/2009) Reviewed HgBA1c results: 6.2 (01/13/2010)  6.1 (07/27/2009)  Problem # 2:  SPECIAL SCREENING FOR MALIGNANT NEOPLASMS COLON (ICD-V76.51) Referral placed, pt willing to go.  Orders: Gastroenterology Referral (GI)  Problem # 3:  HYPERLIPIDEMIA (ICD-272.4) Lipitor recently increased, f/u repeat LFT's and lipids at next visit.  His updated medication list for this problem includes:    Lipitor 40 Mg Tabs (Atorvastatin calcium) .Marland Kitchen... Take 1 tablet by mouth once a day  Labs Reviewed: SGOT: 57 (11/29/2009)   SGPT: 62 (11/29/2009)  Lipid Goals: Chol Goal: 200 (02/15/2009)   HDL Goal: 40 (02/15/2009)   LDL Goal: 100 (02/15/2009)   TG Goal: 150 (02/15/2009)  Prior 10 Yr Risk  Heart Disease: 11 % (06/22/2009)   HDL:45 (11/29/2009), 46 (06/08/2009)  LDL:114 (11/29/2009), 116 (06/08/2009)  Chol:174 (11/29/2009), 175 (06/08/2009)  Trig:76 (11/29/2009), 66 (06/08/2009)  Problem # 4:  CHALAZION (ICD-373.2) Surgery scheduled, not obviously infected at this time.   Problem # 5:  HIV DISEASE (ICD-042) Very well controlled, follows w/ Dr. Daiva Eves.  Problem # 6:  HYPERTENSION (ICD-401.9) ACE I recently increased, BP better.  Repeat K/cr WNL.  His updated medication list for this problem includes:    Triamterene-hctz 75-50 Mg Tabs (Triamterene-hctz) .Marland Kitchen... 1/2 pill daily    Verapamil  Hcl Cr 240 Mg Tbcr (Verapamil hcl) .Marland Kitchen... Take 1 tablet by mouth once a day    Lisinopril 20 Mg Tabs (Lisinopril) .Marland Kitchen... Take 1 tablet by mouth once a day  Complete Medication List: 1)  Triamterene-hctz 75-50 Mg Tabs (Triamterene-hctz) .... 1/2 pill daily 2)  Verapamil Hcl Cr 240 Mg Tbcr (Verapamil hcl) .... Take 1 tablet by mouth once a day 3)  Lancets Fine 28g Misc (Lancets) .... Use to check blood sugars three times daily 4)  Accu-chek Aviva Strp (Glucose blood) .... Use to test blood glucose 4x-6x daily-dispense 3 months 5)  Accu-chek Softclix Lancets Misc (Lancets) .... Use to test your blood sugar 4x-6x daily 6)  Novolog Flexpen 100 Unit/ml Soln (Insulin aspart) .... 5 units with breakfast, 5 units with lunch, 5 units with dinner and 5 units with midnight meal, may adjust acording to post meal check  for a total of 26-35 units/day- 7)  Pen Needles 31g X 8 Mm Misc (Insulin pen needle) .... Use to inject insulin 4 times daily 8)  Atripla 600-200-300 Mg Tabs (Efavirenz-emtricitab-tenofovir) .... Take 1 tablet by mouth once a day 9)  Anacin 81 Mg Tbec (Aspirin) .... Take 1 tablet by mouth once a day 10)  Fish Oil Concentrate 1000 Mg Caps (Omega-3 fatty acids) .... Take 1 tablet by mouth two times a day 11)  Lantus Solostar 100 Unit/ml Soln (Insulin glargine) .... Inject 23 units subcutaneously  once daily 12)  Keto-diastix Strp (Urine glucose-ketones test) .... Use to check ketones when blood sugar higher than 240 mg/dl, call if ever moderate to large 13)  Lisinopril 20 Mg Tabs (Lisinopril) .... Take 1 tablet by mouth once a day 14)  Lipitor 40 Mg Tabs (Atorvastatin calcium) .... Take 1 tablet by mouth once a day  Patient Instructions: 1)  Please make a followup appointment in 3 months to check on your diabetes.   2)  Please go to your GI appointment for a colonoscopy. 3)  Great job with your diabetes!  Prevention & Chronic Care Immunizations   Influenza vaccine: Fluvax Non-MCR  (07/08/2007)   Influenza vaccine due: 06/02/2010    Tetanus booster: Not documented    Pneumococcal vaccine: Historical  (10/09/2006)  Colorectal Screening   Hemoccult: Not documented    Colonoscopy: Not documented   Colonoscopy action/deferral: GI referral  (01/13/2010)  Other Screening   PSA: Not documented   PSA action/deferral: Discussion deferred  (01/13/2010)   Smoking status: quit  (01/13/2010)  Diabetes Mellitus   HgbA1C: 6.2  (01/13/2010)   HgbA1C action/deferral: Ordered  (07/27/2009)    Eye exam: No diabetic retinopathy.     (07/19/2009)   Eye exam due: 08/2010    Foot exam: yes  (03/12/2009)   Foot exam action/deferral: Do today   High risk foot: Not documented   Foot care education: Done  (01/13/2010)    Urine microalbumin/creatinine ratio: 8.7  (03/12/2009)    Diabetes flowsheet reviewed?: Yes   Progress toward A1C goal: At goal  Lipids   Total Cholesterol: 174  (11/29/2009)   LDL: 114  (11/29/2009)   LDL Direct: Not documented   HDL: 45  (11/29/2009)   Triglycerides: 76  (11/29/2009)    SGOT (AST): 57  (11/29/2009)   SGPT (ALT): 62  (11/29/2009)   Alkaline phosphatase: 99  (11/29/2009)   Total bilirubin: 0.3  (11/29/2009)    Lipid flowsheet reviewed?: Yes   Progress toward LDL goal: Unchanged  Hypertension   Last Blood Pressure: 123 / 78   (  01/13/2010)   Serum creatinine: 1.07  (12/20/2009)   Serum potassium 4.2  (12/20/2009)    Hypertension flowsheet reviewed?: Yes   Progress toward BP goal: At goal  Self-Management Support :   Personal Goals (by the next clinic visit) :     Personal A1C goal: 7  (01/13/2010)     Personal blood pressure goal: 130/80  (01/13/2010)     Personal LDL goal: 100  (01/13/2010)    Patient will work on the following items until the next clinic visit to reach self-care goals:     Medications and monitoring: bring all of my medications to every visit, examine my feet every day  (01/13/2010)     Eating: eat more vegetables, use fresh or frozen vegetables, eat foods that are low in salt, eat baked foods instead of fried foods  (01/13/2010)     Activity: take a 30 minute walk every day  (01/13/2010)    Diabetes self-management support: Education handout, Resources for patients handout, Written self-care plan  (01/13/2010)   Diabetes care plan printed   Diabetes education handout printed   Last diabetes self-management training by diabetes educator: 12/13/2009   Last medical nutrition therapy: 10/05/2008    Hypertension self-management support: Education handout, Resources for patients handout, Written self-care plan  (01/13/2010)   Hypertension self-care plan printed.   Hypertension education handout printed    Lipid self-management support: Education handout, Resources for patients handout, Written self-care plan  (01/13/2010)   Lipid self-care plan printed.   Lipid education handout printed      Resource handout printed.   Nursing Instructions: GI referral for screening colonoscopy (see order) Diabetic foot exam today     Laboratory Results   Blood Tests   Date/Time Received: .Alric Quan  January 13, 2010 9:03 AM  Date/Time Reported: .Alric Quan  January 13, 2010 9:04 AM   HGBA1C: 6.2%   (Normal Range: Non-Diabetic - 3-6%   Control Diabetic - 6-8%) CBG Random:: 145mg /dL

## 2010-11-01 NOTE — Assessment & Plan Note (Signed)
Summary: EST-CK/FU/MEDS/CFB   Primary Provider:  Paulette Blanch Dam MD  CC:  follow-up visit, c/o pressure and clogged feeling right ear, right leg swelling "does not feel right", and pt concerned of clot.  History of Present Illness: 50 yo AA male with HIV here for followup with his partner. His HIV is well controlled with vl of 56 and cd4 above 800 on recent check.   He has the following specific complaints today:  #1 acute worsening of swelling in his right leg where he had prior extensive DVT. He has noticied increased swelling pain in this leg. He has no dyspnea on exertion, no dizziness or lightheadedness.  #2 complains of fullness in his right ear which has been rx with augmentin course but which has recurred and been going on for 4-5 months. He denies fevers, chills, nausea or otorrhea  #3 He complains of gaining weight especially around his mid-section and requests counselling re losing this extra weight and adipose tissue.  #4 he wonders if his lipitor could be changed to zocor and I explained he would not likely meet goal for LDL less than 100 much less so less than 70 with zocor  Current Medications (verified): 1)  Triamterene-Hctz 75-50 Mg Tabs (Triamterene-Hctz) .... 1/2 Pill Daily 2)  Verapamil Hcl Cr 240 Mg Tbcr (Verapamil Hcl) .... Take 1 Tablet By Mouth Once A Day 3)  Lancets Fine 28g  Misc (Lancets) .... Use To Check Blood Sugars Three Times Daily 4)  Accu-Chek Aviva  Strp (Glucose Blood) .... Use To Test Blood Glucose 4x Daily 5)  Accu-Chek Softclix Lancets  Misc (Lancets) .... Use To Test Your Blood Sugar 4x Daily 6)  Novolog Flexpen 100 Unit/ml Soln (Insulin Aspart) .... Inject Mealtime and Snacks Doses 5 Units 15 Minutes Before Meals At 7am, 12 Noon, 6 Pm and 12 Midnight 7)  Pen Needles 31g X 8 Mm Misc (Insulin Pen Needle) .... Use To Inject Insulin 4 Times Daily 8)  Atripla 600-200-300 Mg Tabs (Efavirenz-Emtricitab-Tenofovir) .... Take 1 Tablet By Mouth Once A  Day 9)  Anacin 81 Mg Tbec (Aspirin) .... Take 1 Tablet By Mouth Once A Day 10)  Fish Oil Concentrate 1000 Mg Caps (Omega-3 Fatty Acids) .... Take 1 Tablet By Mouth Two Times A Day 11)  Lantus Solostar 100 Unit/ml Soln (Insulin Glargine) .... Inject 23 Units Subcutaneously Once Daily 12)  Keto-Diastix  Strp (Urine Glucose-Ketones Test) .... Use To Check Ketones When Blood Sugar Higher Than 240 Mg/dl, Call If Ever Moderate To Large 13)  Lisinopril 20 Mg Tabs (Lisinopril) .... Take 1 Tablet By Mouth Once A Day 14)  Lipitor 40 Mg Tabs (Atorvastatin Calcium) .... Take 1 Tablet By Mouth Once A Day  Allergies: 1)  ! Augmentin   Preventive Screening-Counseling & Management  Alcohol-Tobacco     Alcohol type: occassional     Smoking Status: quit     Smoking Cessation Counseling: yes     Packs/Day: 5 ciggs     Year Quit: 2008     Passive Smoke Exposure: no  Caffeine-Diet-Exercise     Caffeine use/day: 0     Does Patient Exercise: yes     Type of exercise: walking     Times/week: 3-4   Current Allergies (reviewed today): ! 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    Risk Factors: Caffeine Use: 0 (12/13/2009) Exercise: yes (12/13/2009)  Risk Factors: Smoking Status: quit (12/13/2009) Packs/Day: 5 ciggs (12/13/2009) Passive Smoke Exposure: no (12/13/2009)  Review of Systems  The patient denies anorexia, fever,  weight loss, weight gain, vision loss, decreased hearing, hoarseness, chest pain, syncope, dyspnea on exertion, peripheral edema, prolonged cough, headaches, hemoptysis, abdominal pain, melena, hematochezia, severe indigestion/heartburn, hematuria, incontinence, genital sores, muscle weakness, suspicious skin lesions, transient blindness, difficulty walking, depression, unusual weight change, abnormal bleeding, and enlarged lymph nodes.    Vital Signs:  Patient profile:   50 year old male Height:      74.5 inches (189.23 cm) Weight:      222.2 pounds (101.00 kg) BMI:     28.25 Temp:     97.6 degrees F (36.44 degrees C) oral Pulse rate:   61 / minute BP sitting:   147 / 82  (left arm)  Vitals Entered By: Wendall Mola CMA Duncan Dull) (December 13, 2009 9:43 AM) CC: follow-up visit, c/o pressure and clogged feeling right ear, right leg swelling "does not feel right", pt concerned of clot Is Patient Diabetic? Yes Did you bring your meter with you today? Yes Pain Assessment Patient in pain? yes     Location: right leg Intensity: 3 Type: aching Onset of pain  Intermittent Nutritional Status BMI of 25 - 29 = overweight Nutritional Status Detail appetite "too good"  Does patient need assistance? Functional Status Self care Ambulation Normal Comments no missed doses of meds per patient   Physical Exam  General:  alert and well-developed.   Head:  normocephalic and atraumatic.   Eyes:  chlazion on both upper and lower right eyelids, no purulence, exudate on eyes themselves Ears:  right ear with wax present copious, TM is fairly clear behind the large amt of wax, left ear TM clear no pain with manipulating tragus either ear Nose:  no external deformity and no external erythema.   Mouth:  pharynx pink and moist, no erythema, and no exudates.   Neck:  supple and full ROM.   Lungs:  normal respiratory effort, no crackles, and no wheezes.   Heart:  normal rate, regular rhythm, no murmur,  no gallop, and no rub.   Abdomen:  soft, non-tender, normal bowel sounds, no distention, and no masses.   Msk:  normal ROM.   Extremities:  right leg with 2+ edema tenderness to palpation of the calf Neurologic:  alert & oriented X3.  gait normal.   Skin:  no rashes and no petechiae.   Psych:  Oriented X3.  normally interactive and good eye contact.          Medication Adherence: 12/13/2009   Adherence to medications reviewed with patient. Counseling to provide adequate adherence provided   Prevention For Positives: 12/13/2009   Safe sex practices discussed with patient. Condoms offered.   Education Materials Provided: 12/13/2009 Safe sex practices discussed with patient. Condoms offered.                          Impression & Recommendations:  Problem # 1:  HIV DISEASE (ICD-042)  Very well controlled.  Diagnostics Reviewed:  HIV: CDC-defined  AIDS (11/20/2009)   CD4: 830 (11/30/2009)   WBC: 5.4 (11/29/2009)   Hgb: 14.7 (11/29/2009)   HCT: 43.7 (11/29/2009)   Platelets: 219 (11/29/2009) HIV-1 RNA: 56 (11/29/2009)   HBSAg: No (11/26/2006)  Orders: Est. Patient Level V (20254)  Problem # 2:  CERUMEN IMPACTION, RIGHT (ICD-380.4) Assessment: New had our RNs irrigate. Use antihistamines, decongestants. If persists worsend may need retreatment and referral to ENT Orders: Cerumen Impaction Removal (27062) Est. Patient Level V (37628)  Problem # 3:  EDEMA LEG (ICD-782.3) Assessment: New I was VERY worried for recurrence of DVT. Fortunately the STAT doppler that was ordered came back negative for DVT and negative for bakers cyst. Manage this symptomatcally. His updated medication list for this problem includes:    Triamterene-hctz 75-50 Mg Tabs (Triamterene-hctz) .Marland Kitchen... 1/2 pill daily  Orders: Vascular Other (Vascular other) Est. Patient Level V (31517)  Problem # 4:  DVT (ICD-453.40) didnt have today. If he has anothe one I would put him on lifelong  coumadin Orders: Vascular Other (Vascular other) Est. Patient Level V (61607)  Problem # 5:  HYPERTENSION (ICD-401.9) Assessment: Comment Only Was not at goal. Because I could intensify regmen without difficulty I doubled his lisinopril, will bring back in 1 wk to rehcheck bmp The following medications were removed from the medication list:    Lisinopril 10 Mg Tabs (Lisinopril) .Marland Kitchen... Take 1 tablet by mouth once a day His updated medication list for this problem includes:    Triamterene-hctz 75-50 Mg Tabs (Triamterene-hctz) .Marland Kitchen... 1/2 pill daily    Verapamil Hcl Cr 240 Mg Tbcr (Verapamil hcl) .Marland Kitchen... Take 1 tablet by mouth once a day    Lisinopril 20 Mg Tabs (Lisinopril) .Marland Kitchen... Take 1 tablet by mouth once a day  Orders: Est. Patient Level V (99215)Future Orders: T-Basic Metabolic Panel [id number]) ... 12/20/2009  BP today: 147/82 Prior BP: 128/81 (07/27/2009)  Prior 10 Yr Risk Heart Disease: 11 % (06/22/2009)  Labs Reviewed: K+: 4.2 (11/29/2009) Creat: : 1.05 (11/29/2009)   Chol: 174 (11/29/2009)   HDL: 45 (11/29/2009)   LDL: 114 (11/29/2009)   TG: 76 (11/29/2009)  Problem # 6:  HYPERLIPIDEMIA (ICD-272.4) Assessment: Comment Only INcreased his lipitor to 20mg  The following medications were removed from the medication list:    Lipitor 20 Mg Tabs (Atorvastatin calcium) .Marland Kitchen... Take 1 tablet by mouth once a day His updated medication list for this problem includes:    Lipitor 40 Mg Tabs (Atorvastatin calcium) .Marland Kitchen... Take 1 tablet by mouth once a day  Orders: Est. Patient Level V (99215)Future Orders: T-Lipid Profile ([id number]) ... 06/11/2010  Problem # 7:  CHALAZION (ICD-373.2)  He is apparently going to need further surgery again.  Orders: Est. Patient Level V (93818)  Problem # 8:  DM (ICD-250.00) Well controlled and met with Jamison Neighbor today as well. He wishes to lose weight and counselled him re diet and exercise. [name] has class for this as well. The following  medications were removed from the medication list:    Lisinopril 10 Mg Tabs (Lisinopril) .Marland Kitchen... Take 1 tablet by mouth once a day His updated medication list for this problem includes:    Novolog Flexpen 100 Unit/ml Soln (Insulin aspart) ..... Inject mealtime and snacks doses 5 units 15 minutes before meals at 7am, 12 noon, 6 pm and 12 midnight    Anacin 81 Mg Tbec (Aspirin) .Marland Kitchen... Take 1 tablet by mouth once a day    Lantus Solostar 100 Unit/ml Soln (Insulin glargine) ..... Inject 23 units  subcutaneously once daily    Lisinopril 20 Mg Tabs (Lisinopril) .Marland Kitchen... Take 1 tablet by mouth once a day  Orders: Est. Patient Level V (56433)  Medications Added to Medication List This Visit: 1)  Lisinopril 20 Mg Tabs (Lisinopril) .... Take 1 tablet by mouth once a day 2)  Lipitor 40 Mg Tabs (Atorvastatin calcium) .... Take 1 tablet by mouth once a day  Other Orders: Future Orders: T-CD4SP ([facility]) (CD4SP) ... 06/11/2010 T-HIV Viral Load [id number]) ... 06/11/2010 T-CBC w/Diff ([id number]) ... 06/11/2010 T-Comprehensive Metabolic Panel [id number]) ... 06/11/2010  Patient Instructions: 1)  We need stat dopplers of your right leg 2)  IF you have another DVT we will need to bring you back to clinic today for lovenox and coumadin 3)  Make appt with DR. Wilson 4)  Rehceck labs (bmp) in one week   Prescriptions: TRIAMTERENE-HCTZ 75-50 MG TABS (TRIAMTERENE-HCTZ) 1/2 pill daily  #45 x 4   Entered and Authorized by:   Acey Lav MD   Signed by:   Paulette Blanch Dam MD on 12/13/2009   Method used:   Electronically to        [facility]* (retail)       [address]       [address] Shipping/mailing       [place], [place]  [address]       Ph: [phone]       Fax: [phone]   RxID:   [id number] VERAPAMIL HCL CR 240 MG TBCR (VERAPAMIL HCL) Take 1 tablet by mouth once a day  #90 x 4   Entered and Authorized by:   Acey Lav MD   Signed by:   Paulette Blanch Dam MD on 12/13/2009   Method used:   Electronically to        [facility]* (retail)       [address]       [address] Shipping/mailing       [place], [place]  [address]       Ph: [phone]       Fax: [phone]   RxID:   [id number] LANCETS FINE 28G  MISC (LANCETS) use to check blood sugars three times daily  #300 x 4   Entered and Authorized by:   Acey Lav MD   Signed by:   Paulette Blanch Dam MD on 12/13/2009   Method used:   Electronically to        [facility]* (retail)       [address]       [address] Shipping/mailing       [place], [place]  [address]       Ph: [phone]       Fax: [phone]   RxID:   [id number] ACCU-CHEK AVIVA  STRP (GLUCOSE BLOOD) use to test blood glucose 4x daily  #450 x 4   Entered and Authorized by:   Acey Lav MD   Signed by:   Paulette Blanch Dam MD on 12/13/2009   Method used:   Electronically to        [facility]* (retail)       [address]       [address] Shipping/mailing       [place], [place]  [address]       Ph: [phone]       Fax: [phone]  RxID:   [id number] ACCU-CHEK SOFTCLIX LANCETS  MISC (LANCETS) use to test your blood sugar 4x daily  #400 x 4   Entered and Authorized by:   Acey Lav MD   Signed by:   Paulette Blanch Dam MD on 12/13/2009   Method used:   Electronically to        [facility]* (retail)       [address]       [address] Shipping/mailing       [place], [place]  [address]       Ph: [phone]       Fax: [phone]   RxID:   [id number] NOVOLOG FLEXPEN 100 UNIT/ML SOLN (INSULIN ASPART) inject mealtime and snacks doses 5 units 15 minutes before meals at 7Am, 12 noon, 6 Pm and 12 midnight  #9boxes x 4   Entered and Authorized by:   Acey Lav MD   Signed by:   Paulette Blanch Dam MD on 12/13/2009   Method used:   Electronically to        [facility]* (retail)       [address]       [address] Shipping/mailing       [place], [place]  [address]       Ph: [phone]       Fax: [phone]   RxID:   [id number] PEN NEEDLES 31G X 8 MM MISC (INSULIN PEN NEEDLE) use to inject insulin 4 times daily  #4 boxes x 4   Entered and Authorized by:   Acey Lav MD   Signed by:   Paulette Blanch Dam MD on 12/13/2009   Method used:   Electronically to        [facility]* (retail)       [address]       [address] Shipping/mailing       [place], [place]  [address]       Ph: [phone]       Fax: [phone]   RxID:   [id number] ATRIPLA 600-200-300 MG TABS (EFAVIRENZ-EMTRICITAB-TENOFOVIR) Take 1 tablet by mouth once a day  #90 x 3   Entered and Authorized by:   Acey Lav MD   Signed by:   Paulette Blanch Dam MD on 12/13/2009   Method used:   Electronically to        [facility]* (retail)       [address]       [address] Shipping/mailing       [place], [place]  [address]       Ph: [phone]       Fax: [phone]   RxID:   [id number] ANACIN 81 MG TBEC (ASPIRIN) Take 1 tablet by mouth once a day  #90 x 4   Entered and Authorized by:   Acey Lav MD   Signed by:   Paulette Blanch Dam MD on 12/13/2009   Method used:   Electronically to        [facility]* (retail)       [address]       [address] [place]mailing       [place], [place]  [address]       Ph: [phone]       Fax: [phone]   RxID:   [id number] FISH OIL CONCENTRATE 1000 MG  CAPS (OMEGA-3 FATTY ACIDS) Take 1 tablet by mouth two times a day  #30 x 11   Entered and Authorized by:   Acey Lav MD   Signed by:   Paulette Blanch Dam MD on 12/13/2009   Method used:   Electronically to        [facility]* (retail)       [address]       [address] Shipping/mailing       [place], [place]  [address]       Ph: [phone]        Fax: [phone]   RxID:   [id number] LANTUS SOLOSTAR 100 UNIT/ML SOLN (INSULIN GLARGINE) inject 23 units subcutaneously once daily Brand medically necessary #2 boxes x 4   Entered and Authorized by:   Acey Lav MD   Signed by:   Paulette Blanch Dam MD on 12/13/2009   Method used:   Electronically to        [facility]* (retail)       [address]       [address] Shipping/mailing       [place], [place]  [address]       Ph: [phone]       Fax: [phone]   RxID:   [id number] LIPITOR 40 MG TABS (ATORVASTATIN CALCIUM) Take 1 tablet by mouth once a day  #30 x 11   Entered and Authorized by:   Acey Lav MD   Signed by:   Paulette Blanch Dam MD on 12/13/2009   Method used:   Electronically to        [facility]* (retail)       [address]       [address] Shipping/mailing       [place], [place]  [address]       Ph: [phone]       Fax: [phone]   RxID:   [id number] LISINOPRIL 20 MG TABS (LISINOPRIL) Take 1 tablet by mouth once a day  #30 x 11   Entered and Authorized by:   Acey Lav MD   Signed by:   Paulette Blanch Dam MD on 12/13/2009   Method used:   Electronically to        [facility]* (retail)       [address]       [address] Shipping/mailing       [place], [place]  [address]       Ph: [phone]       Fax: [phone]   RxID:   [id number]  Process Orders Check Orders Results:     [facility]: ABN not required for this insurance Tests Sent for requisitioning (December 13, 2009 1:53 PM):     06/11/2010: [facility] -- T-HIV Viral Load [phone] (signed)     06/11/2010: [facility] -- T-CBC w/Diff [[id number]] (signed)     06/11/2010: [facility] -- T-Comprehensive Metabolic Panel [[id number]] (signed)     06/11/2010: [facility] -- T-Lipid Profile [id number]  (signed)     12/20/2009: [facility] -- T-Basic Metabolic Panel [id number] (signed)

## 2010-11-01 NOTE — Assessment & Plan Note (Signed)
Summary: dm teaching/ds   Vital Signs:  Patient profile:   50 year old male Weight:      219.2 pounds BMI:     27.87 Is Patient Diabetic? Yes Did you bring your meter with you today? Yes   Allergies: 1)  ! Augmentin  Diabetes Management Exam:    Foot Exam (with socks and/or shoes not present):       Sensory-Monofilament:          Left foot: normal          Right foot: normal   Complete Medication List: 1)  Triamterene-hctz 75-50 Mg Tabs (Triamterene-hctz) .... 1/2 pill daily 2)  Verapamil Hcl Cr 240 Mg Tbcr (Verapamil hcl) .... Take 1 tablet by mouth once a day 3)  Lancets Fine 28g Misc (Lancets) .... Use to check blood sugars three times daily 4)  Accu-chek Aviva Strp (Glucose blood) .... Use to test blood glucose 4x-6x daily-dispense 3 months 5)  Accu-chek Softclix Lancets Misc (Lancets) .... Use to test your blood sugar 4x-6x daily 6)  Novolog Flexpen 100 Unit/ml Soln (Insulin aspart) .... 5 units with breakfast, 5 units with lunch, 5 units with dinner and 5 units with midnight meal, may adjust acording to post meal check  for a total of 26-35 units/day- 7)  Pen Needles 31g X 8 Mm Misc (Insulin pen needle) .... Use to inject insulin 4 times daily 8)  Atripla 600-200-300 Mg Tabs (Efavirenz-emtricitab-tenofovir) .... Take 1 tablet by mouth once a day 9)  Anacin 81 Mg Tbec (Aspirin) .... Take 1 tablet by mouth once a day 10)  Fish Oil Concentrate 1000 Mg Caps (Omega-3 fatty acids) .... Take 1 tablet by mouth two times a day 11)  Lantus Solostar 100 Unit/ml Soln (Insulin glargine) .... Inject 23 units subcutaneously once daily 12)  Keto-diastix Strp (Urine glucose-ketones test) .... Use to check ketones when blood sugar higher than 240 mg/dl, call if ever moderate to large 13)  Lisinopril 20 Mg Tabs (Lisinopril) .... Take 1 tablet by mouth once a day 14)  Lipitor 40 Mg Tabs (Atorvastatin calcium) .... Take 1 tablet by mouth once a day 15)  Flexeril 10 Mg Tabs (Cyclobenzaprine hcl)  .... Take 1 tablet 3 times per day as needed for pain 16)  Lidoderm 5 % Ptch (Lidocaine) .... Apply every 12 hours as needed for pain  Other Orders: DSMT(Medicare) Individual, 30 Minutes (G0108)  Last LDL:                                                 114 (11/29/2009 6:50:00 PM)        Diabetic Foot Exam Last Podiatry Exam Date: 01/13/2010 Foot Inspection Is there a history of a foot ulcer?              No Is there a foot ulcer now?              No Can the patient see the bottom of their feet?          Yes Are the shoes appropriate in style and fit?          Yes Is there swelling or an abnormal foot shape?          No Are the toenails long?  No Are the toenails thick?                No Are the toenails ingrown?              No Is there heavy callous build-up?              No Is there a claw toe deformity?                          No Is there elevated skin temperature?            No Is there limited ankle dorsiflexion?            No Is there foot or ankle muscle weakness?            No Do you have pain in calf while walking?           No      Pulse Check          Right Foot          Left Foot Posterior Tibial:        couldn't detect            present Dorsalis Pedis:        couldn't detect            present Comments: Lower right leg swells sometimes from removaal of veins High Risk Feet? No Set Next Diabetic Foot Exam here: 04/12/2011   10-g (5.07) Semmes-Weinstein Monofilament Test Performed by: Jamison Neighbor RD          Right Foot          Left Foot Visual Inspection     normal           normal Test Control      normal         normal Site 1         normal         normal Site 2         normal         normal Site 3         normal         normal Site 4         normal         normal Site 5         normal         normal Site 6         normal         normal Site 7         normal         normal  Impression      normal         normal   Diabetes Self  Management Training  PCP: Laren Everts MD Referring MD: Paulette Blanch Dam MD Date diagnosed with diabetes: 06/26/2008 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 No Anti-platelet Meds? Yes Comments: meter downloaded- excellent control: testing 3-4 times a day, aerage is 107, range 73 to 225 with 21 standard deviation, using 5 units Novolog 4x/day before meals and one big snack, 23 units lantus one time a day     Monitoring Self monitoring blood glucose 4 times a day Name of  Meter  Accucheck Aviva Measures urine ketones? No  Recent Episodes of: Requiring Help from another person  Hyperglycemia : No Hypoglycemia: No Severe Hypoglycemia : No   Wears Medical I.D. Yes Car[employer] for Low Blood sugar Yes Can you tell if your blood sugar is low? Yes  Last Physician foot exam:  04/11/2010 Next foot exam due: 04/12/2011  Would you  say you are physically active: Yes Diabetes Disease Process  Discussed today  Medications State diabetes medication adjustments for sick days: Demonstrates competency    Nutritional Management  Monitoring  Complications State the causes-signs and symptoms and prevention of Hyperglycemia: Demonstrates competency   Explain proper treatment of hyperglycemia: Demonstrates competency   State the causes- signs and symptoms and prevention of hypoglycemia: Demonstrates competency   Explain proper treatment of hypoglycemia: Demonstrates competency   State the principles of skin-dental and foot care: Demonstrates competency   Describe symptoms of skin and foot problems and describe foot exam: Demonstrates competency   State when to seek medical advice and treatment: Demonstrates competency    Exercise Diabetes Management Education Done: 04/04/2010         request 3 month supply prescriptions per patient preference. Can decrease A1C testing to every 6 months= due 10/12-patient is fine with this. LDL 3-6 months after he has been supplementing with soluble fiber=10/11 to 1/12.   Diabetes Self Management Support: clinic and significant other  Follow-up:  6 -12 months per patient desire.

## 2010-11-01 NOTE — Miscellaneous (Signed)
Summary: clinical update/ryan white  Clinical Lists Changes  Observations: Added new observation of PCTFPL: 321.70  (12/16/2009 12:16) Added new observation of YEARLYEXPEN: 4000  (12/16/2009 12:16) Added new observation of HOUSEINCOME: 54098  (12/16/2009 12:16) Added new observation of FINASSESSDT: 11/29/2009  (12/16/2009 12:16)

## 2010-11-01 NOTE — Assessment & Plan Note (Signed)
Summary: F/U OV/VS   Visit Type:  Follow-up Primary Provider:  Laren Everts MD  CC:  hiv.  History of Present Illness: 50 yo AA pt of mine with perfect virological suppression and immune reconstitution on [place]. He has additional comorbid problems of diabetes, HTN, hyperlipidemia. He had spine films done for back pain which were unrevealing and he has since had resolution of his back pain. He asks for hga1c today and is getting eye exam this fall. He is not at goal for LDL with re to DM and we discussed options of changing to raltegravir and truvada vs chaging his statin.  Hypertension History:      Positive major cardiovascular risk factors include male age 61 years old or older, diabetes, hyperlipidemia, and hypertension.  Negative major cardiovascular risk factors include non-tobacco-user status.        Further assessment for target organ damage reveals no history of ASHD, stroke/TIA, or peripheral vascular disease.    Lipid Management History:      Positive NCEP/ATP III risk factors include male age 33 years old or older, diabetes, and hypertension.  Negative NCEP/ATP III risk factors include non-tobacco-user status, no ASHD (atherosclerotic heart disease), no prior stroke/TIA, no peripheral vascular disease, and no history of aortic aneurysm.    Problems Prior to Update: 1)  Back Pain  (ICD-724.5) 2)  Cerumen Impaction, Right  (ICD-380.4) 3)  Edema Leg  (ICD-782.3) 4)  Leg Cramps, Nocturnal  (ICD-729.82) 5)  Preventive Health Care  (ICD-V70.0) 6)  Degenerative Joint Disease, Knee  (ICD-715.96) 7)  Hyperlipidemia  (ICD-272.4) 8)  Dm  (ICD-250.00) 9)  Chalazion  (ICD-373.2) 10)  Dvt  (ICD-453.40) 11)  Mrsa Infection,probable  (ICD-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 1/2 Pill Daily 2)  Verapamil Hcl Cr 240 Mg Tbcr (Verapamil  Hcl) .... Take 1 Tablet By Mouth Once A Day 3)  Lancets Fine 28g  Misc (Lancets) .... Use To Check Blood Sugars Three Times Daily 4)  Accu-Chek Aviva  Strp (Glucose Blood) .... Use To Test Blood Glucose 4x-6x Daily-Dispense 3 Months 5)  Accu-Chek Softclix Lancets  Misc (Lancets) .... Use To Test Your Blood Sugar 4x-6x Daily 6)  Novolog Flexpen 100 Unit/ml Soln (Insulin Aspart) .... 5 Units With Breakfast, 5 Units With Lunch, 5 Units With Dinner and 5 Units With Midnight Meal, May Adjust Acording To Post Meal Check  For A Total of 26-35 Units/day- 7)  Pen Needles 31g X 8 Mm Misc (Insulin Pen Needle) .... Use To Inject Insulin 4 Times Daily 8)  Atripla 600-200-300 Mg Tabs (Efavirenz-Emtricitab-Tenofovir) .... Take 1 Tablet By Mouth Once A Day 9)  Anacin 81 Mg Tbec (Aspirin) .... Take 1 Tablet By Mouth Once A Day 10)  Fish Oil Concentrate 1000 Mg Caps (Omega-3 Fatty Acids) .... Take 1 Tablet By Mouth Two Times A Day 11)  Lantus Solostar 100 Unit/ml Soln (Insulin Glargine) .... Inject 23 Units Subcutaneously Once Daily 12)  Keto-Diastix  Strp (Urine Glucose-Ketones Test) .... Use To Check Ketones When Blood Sugar Higher Than 240 Mg/dl, Call If Ever Moderate To Large 13)  Lisinopril 20 Mg Tabs (Lisinopril) .... Take 1 Tablet By Mouth Once A Day 14)  Lipitor 40 Mg Tabs (Atorvastatin Calcium) .... Take 1 Tablet By Mouth Once A Day 15)  Flexeril 10 Mg Tabs (Cyclobenzaprine Hcl) .... Take 1 Tablet 3 Times Per  Day As Needed For Pain 16)  Lidoderm 5 % Ptch (Lidocaine) .... Apply Every 12 Hours As Needed For Pain  Current Medications (verified): 1)  Triamterene-Hctz 75-50 Mg Tabs (Triamterene-Hctz) .... 1/2 Pill Daily 2)  Verapamil Hcl Cr 240 Mg Tbcr (Verapamil Hcl) .... Take 1 Tablet By Mouth Once A Day 3)  Lancets Fine 28g  Misc (Lancets) .... Use To Check Blood Sugars Three Times Daily 4)  Accu-Chek Aviva  Strp (Glucose Blood) .... Use To Test Blood Glucose 4x-6x Daily-Dispense 3 Months 5)  Accu-Chek  Softclix Lancets  Misc (Lancets) .... Use To Test Your Blood Sugar 4x-6x Daily 6)  Novolog Flexpen 100 Unit/ml Soln (Insulin Aspart) .... 5 Units With Breakfast, 5 Units With Lunch, 5 Units With Dinner and 5 Units With Midnight Meal, May Adjust Acording To Post Meal Check  For A Total of 26-35 Units/day- 7)  Pen Needles 31g X 8 Mm Misc (Insulin Pen Needle) .... Use To Inject Insulin 4 Times Daily 8)  Atripla 600-200-300 Mg Tabs (Efavirenz-Emtricitab-Tenofovir) .... Take 1 Tablet By Mouth Once A Day 9)  Anacin 81 Mg Tbec (Aspirin) .... Take 1 Tablet By Mouth Once A Day 10)  Fish Oil Concentrate 1000 Mg Caps (Omega-3 Fatty Acids) .... Take 1 Tablet By Mouth Two Times A Day 11)  Lantus Solostar 100 Unit/ml Soln (Insulin Glargine) .... Inject 23 Units Subcutaneously Once Daily 12)  Keto-Diastix  Strp (Urine Glucose-Ketones Test) .... Use To Check Ketones When Blood Sugar Higher Than 240 Mg/dl, Call If Ever Moderate To Large 13)  Lisinopril 20 Mg Tabs (Lisinopril) .... Take 1 Tablet By Mouth Once A Day 14)  Flexeril 10 Mg Tabs (Cyclobenzaprine Hcl) .... Take 1 Tablet 3 Times Per Day As Needed For Pain 15)  Lidoderm 5 % Ptch (Lidocaine) .... Apply Every 12 Hours As Needed For Pain 16)  Crestor 20 Mg Tabs (Rosuvastatin Calcium) .... Take 1 Tablet By Mouth Once A Day (To Replace Lipitor)  Allergies (verified): 1)  ! Augmentin     Current Allergies (reviewed today): ! 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    Risk Factors: Caffeine Use: 0 (03/23/2010) Exercise: yes (03/23/2010)  Risk Factors: Smoking Status: quit (03/23/2010) Packs/Day: 5 ciggs (03/23/2010) Passive Smoke Exposure: no (03/23/2010)  Review of Systems  The patient denies anorexia, fever, weight loss, weight gain, vision loss, decreased hearing, hoarseness, chest pain, syncope, dyspnea on exertion, peripheral edema, prolonged cough, headaches, hemoptysis, abdominal pain, melena, hematochezia, severe indigestion/heartburn, hematuria, incontinence, genital sores, muscle weakness, suspicious skin lesions, transient blindness, difficulty walking, depression, unusual weight change, abnormal bleeding, and enlarged lymph nodes.    Vital Signs:  Patient profile:   50 year old male Height:      74.5 inches (189.23 cm) Weight:      215 pounds (97.73 kg) BMI:     27.33 Temp:     98.5 degrees F (36.94 degrees C) oral Pulse rate:   80 / minute BP sitting:   129 / 86  (left arm)  Vitals  Entered By: Starleen Arms CMA (April 25, 2010 8:59 AM) CC: hiv Is Patient Diabetic? Yes Did you bring your meter with you today? Yes Pain Assessment Patient in pain? no      Nutritional Status BMI of 25 - 29 = overweight  Does patient need assistance? Functional Status Self care Ambulation Normal    Physical Exam  General:  alert and well-developed.   Head:  normocephalic and atraumatic.   Eyes:  vision grossly intact, pupils equal, pupils round, and pupils reactive to light.   Ears:  no external deformities.   Nose:  no external deformity.   Mouth:  pharynx pink and moist, no erythema, and no exudates.   Lungs:  normal respiratory effort, no crackles, and no wheezes.   Heart:  normal rate, regular rhythm, no murmur, no gallop, and no rub.   Abdomen:  soft, non-tender, normal bowel sounds, no distention, and no masses.   Msk:  normal ROM.     Extremities:  right leg with 2+ edema , left 1+ Neurologic:  alert & oriented X3.  gait normal.   Skin:  no rashes and no petechiae.   Psych:  Oriented X3.  normally interactive and good eye contact.     Impression & Recommendations:  Problem # 1:  HIV DISEASE (ICD-042) Assessment Improved  Superb control! 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 Patient Level IV (97026)  Problem # 2:  HYPERLIPIDEMIA (ICD-272.4)  will not achieve goal with lipitor 40 or in 80 based on ability of tihs drug to lower lipids. Will change him to crestor 20mg  and then he will likely need titraiton up to 40mg . We could as mentioned change his ARV to raltegravir and truvada as this would be the most lipid neutral ARV option for him but will hold off on this maneuver for now The following medications were removed from the medicaation list:l     Lipitor 40 Mg Tabs (Atorvastatin calcium) .Marland Kitchen... Take 1 tablet by mouth once a day His updated medication list for this problem includes:    Crestor 20 Mg Tabs (Rosuvastatin calcium) .Marland Kitchen... Take 1 tablet by mouth once a day (to replace lipitor)  Orders: Est. Patient Level IV (37858)  Problem # 3:  EDEMA LEG (ICD-782.3)  stable His updated medication list for this problem includes:    Triamterene-hctz 75-50 Mg Tabs (Triamterene-hctz) .Marland Kitchen... 1/2 pill daily  Orders: Est. Patient Level IV (85027)  Problem # 4:  DM (ICD-250.00) excellent job with compliance, following in [facility] and with Jamison Neighbor closely His updated medication list for this problem includes:    Novolog Flexpen 100 Unit/ml Soln (Insulin aspart) .Marland KitchenMarland KitchenMarland KitchenMarland Kitchen 5 units with breakfast, 5 units with lunch, 5 units with dinner and 5 units with midnight meal, may adjust acording to  post meal check  for a total of 26-35 units/day-    Anacin 81 Mg Tbec (Aspirin) .Marland Kitchen... Take 1 tablet by mouth once a day    Lantus Solostar 100 Unit/ml Soln (Insulin glargine) ..... Inject 23 units subcutaneously once daily    Lisinopril 20 Mg Tabs (Lisinopril) .Marland Kitchen... Take 1 tablet by mouth once a day  Orders: T-Hgb A1C (in-house) (74128NO) Est. Patient Level IV (67672)  Problem # 5:  HYPERTENSION (ICD-401.9)  Reasonable control His updated medication list for this problem includes:    Triamterene-hctz 75-50 Mg Tabs (Triamterene-hctz) .Marland Kitchen... 1/2 pill daily    Verapamil Hcl Cr 240 Mg Tbcr (Verapamil hcl) .Marland Kitchen... Take 1 tablet by mouth once a day    Lisinopril 20 Mg Tabs (Lisinopril) .Marland Kitchen... Take 1 tablet by mouth once a day  BP today: 129/86 Prior BP: 117/67 (03/23/2010)  10 Yr Risk Heart Disease: 14 % Prior 10 Yr Risk Heart Disease: 11 % (06/22/2009)  Labs Reviewed: K+: 4.2 (12/20/2009) Creat: : 1.07 (12/20/2009)   Chol: 174 (11/29/2009)   HDL: 45 (11/29/2009)   LDL: 114 (11/29/2009)   TG: 76 (11/29/2009)  Orders: Est. Patient Level IV (16109)  Medications Added to Medication List This Visit: 1)  Crestor 20 Mg Tabs (Rosuvastatin calcium) .... Take 1 tablet by mouth once a day (to replace lipitor)  Other Orders: T-Lipid Profile ([id number]) Future Orders: T-CD4SP ([facility]) (CD4SP) ... 04/20/2011 T-HIV Viral Load [phone]) ... 04/20/2011 T-CBC w/Diff ([id number]) ... 04/20/2011 T-Comprehensive Metabolic Panel [phone]) ... 04/20/2011  Hypertension Assessment/Plan:      The patient's hypertensive risk group is category C: Target organ damage and/or diabetes.  His calculated 10 year risk of coronary heart disease is 14 %.  Today's blood pressure is 129/86.  His blood pressure goal is < 130/80.  Lipid Assessment/Plan:      Based on NCEP/ATP III, the patient's risk factor category is "history of diabetes".  The patient's lipid goals are as follows: Total cholesterol goal is  200; LDL cholesterol goal is 100; HDL cholesterol goal is 40; Triglyceride goal is 150.  His LDL cholesterol goal has not been met.       Patient Instructions: 1)  rtc to  clinic in one year 2)  come back fasting for your labs 3)  I will change you to crestor from lipitor and will call this in Prescriptions: CRESTOR 20 MG TABS (ROSUVASTATIN CALCIUM) Take 1 tablet by mouth once a day (to replace lipitor)  #30 x 11   Entered and Authorized by:   Acey Lav MD   Signed by:   Paulette Blanch Dam MD on 04/25/2010   Method used:   Electronically to        [facility]* (retail)       [address]       [address] Shipping/mailing       [place], [place]  [address]       Ph: [phone]       Fax: [phone]   RxID:   [id number]   Appended Document: F/U OV/VS  Laboratory Results   Blood Tests   Date/Time Received: sign Mariea Clonts  April 25, 2010 10:42 AM   Date/Time Reported: Mariea Clonts  April 25, 2010 10:42 AM   HGBA1C: 6.3%   (Normal Range: Non-Diabetic - 3-6%   Control Diabetic - 6-8%)

## 2010-11-01 NOTE — Assessment & Plan Note (Signed)
Summary: FU OV/VS   Vital Signs:  Patient Profile:   50 Years Old Male Height:     74.5 inches (189.23 cm) Weight:      212.0 pounds (96.36 kg) BMI:     26.95 Temp:     97.1 degrees F (36.17 degrees C) oral Pulse rate:   62 / minute BP sitting:   119 / 72  (right arm)  Pt. in pain?   no  Vitals Entered By: Stanton Kidney Ditzler RN (October 09, 2008 1:22 PM)              Is Patient Diabetic? Yes Did you bring your meter with you today? Yes Nutritional Status BMI of 25 - 29 = overweight Nutritional Status Detail appetite good  Have you ever been in a relationship where you felt threatened, hurt or afraid?denies   Does patient need assistance? Functional Status Self care Ambulation Normal Comments Pt prefers not to do CBG - just ate.     PCP:  Acey Lav MD  Chief Complaint:  FU from 10/03/08 severe right leg cramp and passed out. Doing better.Marland Kitchen  History of Present Illness: Pt is a very pleasant 50 yo male w/ past medical history of  Allergic rhinitis GERD HIV disease Hypertension Reduced sexual drive Probably MRSA skin infection, 12/07 Superficial thrombophlebitis Vein stripping Deep venous thrombosis-diagnosed 10/08 Chalazion Diabetes Mellitus-diagnosed 9/09 w/ initial a1c of 9.9    here accompanied by his partner here for f/u on diabetes and evaluation of leg cramps and syncope.  About two weeks ago, he had a leg cramp in bed and darted up out of bed b/c he says that when he gets them, they feel better when he walks around.  He got up quickly and then fell back onto the bed and then the floor and his partner noted he lost consciousness for about 2-3 seconds.  He thought he may have shaken his arms but not loss of bowel, bladder fxn and no tongue biting.  He had no neurological symptoms afterwards and was not confused.  He has cramps regularly in his legs, esp the leg he had venous stripping on since his surgery but has never lost consciousness before.      Prior  Medications Reviewed Using: Patient Recall  Prior Medication List:  TRIAMTERENE-HCTZ 75-50 MG TABS (TRIAMTERENE-HCTZ) 1/2 pill daily VERAPAMIL HCL CR 240 MG TBCR (VERAPAMIL HCL) Take 1 tablet by mouth once a day LISINOPRIL 10 MG  TABS (LISINOPRIL) Take 1 tablet by mouth once a day LANTUS 100 UNIT/ML SOLN (INSULIN GLARGINE) inject 23 units subcutaneously once daily LANCETS FINE 28G  MISC (LANCETS) use to check blood sugars three times daily ACCU-CHEK AVIVA  STRP (GLUCOSE BLOOD) use to test blood glucose 4x daily ACCU-CHEK SOFTCLIX LANCETS  MISC (LANCETS) use to test your blood sugar 4x daily PRAVACHOL 20 MG TABS (PRAVASTATIN SODIUM) Take one pill by mouth at bedtime. NOVOLOG FLEXPEN 100 UNIT/ML SOLN (INSULIN ASPART) inject mealtime and snacks doses 5 units 15 minutes before meals at 7Am, 12 noon, 6 Pm and 12 midnight PEN NEEDLES 31G X 8 MM MISC (INSULIN PEN NEEDLE)  ATRIPLA 600-200-300 MG TABS (EFAVIRENZ-EMTRICITAB-TENOFOVIR) Take 1 tablet by mouth once a day   Current Allergies (reviewed today): ! AUGMENTIN  Past Medical History:    Reviewed history from 08/04/2008 and no changes required:       Allergic rhinitis       GERD       HIV disease  Hypertension       Reduced sexual drive       Probably MRSA skin infection, 12/07       Superficial thrombophlebitis       Vein stripping       Deep venous thrombosis-diagnosed 10/08       Chalazion       Diabetes Mellitus-diagnosed 9/09 w/ initial a1c of 9.9   Past Surgical History:    Reviewed history from 07/08/2007 and no changes required:       Vein stripping   Social History:    Reviewed history from 03/19/2008 and no changes required:       stopped smoking   Risk Factors: Tobacco use:  quit    Year quit:  oct 2008 Passive smoke exposure:  no Drug use:  no HIV high-risk behavior:  yes Caffeine use:  0 drinks per day Alcohol use:  yes    Type:  occassional Exercise:  yes    Times per week:  3-4    Type:   walking Seatbelt use:  100 %   Review of Systems       As per HPI.     Physical Exam  General:     Alert, pleasant, well groomed, no distress. Eyes:     Anicteric, mildly injected conjunctiva bilaterally.  Lungs:     Normal respiratory effort, chest expands symmetrically. Lungs are clear to auscultation, no crackles or wheezes. Heart:     Normal rate and regular rhythm. S1 and S2 normal without gallop, murmur, click, rub or other extra sounds. Abdomen:     +BS's, soft, NT, and ND. Extremities:     R LE more edematous than the L(chronic) and venous stasis changes and varicosities noted bilaterally.  No ulcers or abnormal areas on the feet.  Neurologic:     alert & oriented X3, cranial nerves II-XII intact, strength normal in all extremities, sensation intact to light touch, and gait normal.   Psych:     Mood euthymic.    Impression & Recommendations:  Problem # 1:  SYNCOPE (ICD-780.2) Given his hx of darting up out of bed from slumber b/c of a leg cramp, feel like it was likely related to orthostatic changes.  No hx to suggest seizure.  Doubt arrhythmia or primary neurological event given hx. CBC, CMET and D dimer since event all negative.   Instructed pt to call if he has anymore similar events.    Problem # 2:  LEG CRAMPS, NOCTURNAL (ICD-729.82) Lytes recently checked and normal.  May be related to diabetes.  Could also be restless leg syndrome.  Instructed pt to do stretching exercises before bed and take benadryl before going to sleep.  If this doesn't work, could consider requip trial to help.  Pt notes this does not feel like his previous DVT and his d dimer was neg.  Problem # 3:  HYPERTENSION (ICD-401.9) BP well controlled.  Pt would like to see if he can go off some of his meds so will d/c verapamil and titrate up lisinopril as needed to see if he can get adequate control w/ just two meds.  Pt will f/u in two weeks after d/c'g verapamil.  His updated medication list  for this problem includes:    Triamterene-hctz 75-50 Mg Tabs (Triamterene-hctz) .Marland Kitchen... 1/2 pill daily    Verapamil Hcl Cr 240 Mg Tbcr (Verapamil hcl) .Marland Kitchen... Take 1 tablet by mouth once a day    Lisinopril 10 Mg Tabs (Lisinopril) .Marland KitchenMarland KitchenMarland KitchenMarland Kitchen  Take 1 tablet by mouth once a day   Problem # 4:  DM (ICD-250.00) Pt doing a great job!  Following regularly w/ [name] and doing well.  Up to date on a1c, urine microalb/cr ratio and lipids. Titrating up statin for goal LDL < 70.  Up to date on eye exam.   BP at goal and as above.  Cont current tx plan.  His updated medication list for this problem includes:    Lisinopril 10 Mg Tabs (Lisinopril) .Marland Kitchen... Take 1 tablet by mouth once a day    Lantus 100 Unit/ml Soln (Insulin glargine) ..... Inject 23 units subcutaneously once daily    Novolog Flexpen 100 Unit/ml Soln (Insulin aspart) ..... Inject mealtime and snacks doses 5 units 15 minutes before meals at 7am, 12 noon, 6 pm and 12 midnight    Anacin 81 Mg Tbec (Aspirin) .Marland Kitchen... Take 1 tablet by mouth once a day  Labs Reviewed: HgBA1c: 6.9 (10/05/2008)   Creat: 1.20 (10/05/2008)   Microalbumin: 5.53 (07/03/2008)  Last Eye Exam: No diabetic retinopathy.   Exam b y [facility] [place]  (07/17/2008)   Problem # 5:  HYPERLIPIDEMIA (ICD-272.4) Pt eating lots of bacon and eggs.  Discussed healthier options.  Increasing pravastatin to 40 w/ goal LDL < 70.  His updated medication list for this problem includes:    Pravachol 40 Mg Tabs (Pravastatin sodium) .Marland Kitchen... Take 1 tablet by mouth once a day  Labs Reviewed: Chol: 225 (10/05/2008)   HDL: 44 (10/05/2008)   LDL: 153 (10/05/2008)   TG: 140 (10/05/2008) SGOT: 30 (10/05/2008)   SGPT: 58 (10/05/2008)   Problem # 6:  PREVENTIVE HEALTH CARE (ICD-V70.0) Colonoscopy next year. Up to date on vaccinations.  Complete Medication List: 1)  Triamterene-hctz 75-50 Mg Tabs (Triamterene-hctz) .... 1/2 pill daily 2)  Verapamil Hcl Cr 240 Mg Tbcr (Verapamil hcl) .... Take 1 tablet by mouth  once a day 3)  Lisinopril 10 Mg Tabs (Lisinopril) .... Take 1 tablet by mouth once a day 4)  Lantus 100 Unit/ml Soln (Insulin glargine) .... Inject 23 units subcutaneously once daily 5)  Lancets Fine 28g Misc (Lancets) .... Use to check blood sugars three times daily 6)  Accu-chek Aviva Strp (Glucose blood) .... Use to test blood glucose 4x daily 7)  Accu-chek Softclix Lancets Misc (Lancets) .... Use to test your blood sugar 4x daily 8)  Pravachol 40 Mg Tabs (Pravastatin sodium) .... Take 1 tablet by mouth once a day 9)  Novolog Flexpen 100 Unit/ml Soln (Insulin aspart) .... Inject mealtime and snacks doses 5 units 15 minutes before meals at 7am, 12 noon, 6 pm and 12 midnight 10)  Pen Needles 31g X 8 Mm Misc (Insulin pen needle) 11)  Atripla 600-200-300 Mg Tabs (Efavirenz-emtricitab-tenofovir) .... Take 1 tablet by mouth once a day 12)  Anacin 81 Mg Tbec (Aspirin) .... Take 1 tablet by mouth once a day 13)  Fish Oil Concentrate 1000 Mg Caps (Omega-3 fatty acids) .... Take 1 tablet by mouth two times a day   Patient Instructions: 1)  Please make a followup appointment in three months. 2)  Please increase pravastatin to two pills a day. 3)  It is ok to take benadryl before bedtime and stretch to see if this helps with your leg cramps. 4)  Great job with your diabetes! 5)  Call if you need anything sooner.   Prescriptions: PRAVACHOL 40 MG TABS (PRAVASTATIN SODIUM) Take 1 tablet by mouth once a day  #90 x 2  Entered and Authorized by:   Joaquin Courts  MD   Signed by:   Joaquin Courts  MD on 10/09/2008   Method used:   Print then Give to Patient   RxID:   [id number] ANACIN 81 MG TBEC (ASPIRIN) Take 1 tablet by mouth once a day  #30 x 6   Entered and Authorized by:   Joaquin Courts  MD   Signed by:   Joaquin Courts  MD on 10/09/2008   Method used:   Print then Give to Patient   RxID:   [id number]

## 2010-11-01 NOTE — Consult Note (Signed)
Summary: [facility]  [facility]   Imported By: Louretta Parma 07/26/2010 16:44:52  _____________________________________________________________________  External Attachment:    Type:   Image     Comment:   External Document  Appended Document: [facility] No evidence of Diabetic Retinopathy.  Appended Document: [facility]   Diabetic Eye Exam  Procedure date:  07/19/2010  Findings:      No diabetic retinopathy.     Procedures Next Due Date:    Diabetic Eye Exam: 08/2011   Diabetic Eye Exam  Procedure date:  07/19/2010  Findings:      No diabetic retinopathy.     Procedures Next Due Date:    Diabetic Eye Exam: 08/2011

## 2010-11-01 NOTE — Progress Notes (Signed)
Summary: emailed CBgs and response/dmr  Phone Note Other Incoming   Summary of Call: email from patient and response form CDE. Updated Novolog doses per his email. Initial call taken by: Jamison Neighbor RD,CDE,  December 17, 2009 4:51 PM    New/Updated Medications: NOVOLOG FLEXPEN 100 UNIT/ML SOLN (INSULIN ASPART) 5 units with breakfast, 5 units with Lunch, 5 units with dinner and 5 units with midnight meal, may adjust acording to post meal check  for a total of 26-35 units/day-  Great information and good job getting this done! Excellent blood sugars  Dr. Andrey Campanile looked at these as well- hope you don't mind. We both think that because most of your after meal sugars are < 50 points higher than your pre-meal sugars that 5 units is okay if you are not having lows. We will officially change you to 5 units four times a day for now if that is okay?  That being said, I wouldn't feel like I was doing my job helping you if I didn't point out the 4 meals below the difference is <30 ( ? too much insulin or not enough food)  and 3 meals the difference is > 50 points (? too much food or not enough insulin) and ask some questions. (feel free to ignore them- your prerogative! J)   Do you remember what you ate at the meals I bolded below and underlined ?  If so, can you identify what  you may have eaten that raised sugar > 50 points?  What do you want to do about it? Nothing, Change food, medicine or physical activity?   If you had a clear trend such as > 50 points everyday at breakfast (currently only 2 of 3 are > 50) then I would suggest 6 units at that meal.    Some meals I think the 5 may have been too much insulin when you didn't go up at least 30 points like at (4 meals- dinner x2 and lunch x1 and midnight x1))   Carb counting is a bit more work, but using it to self adjust your own doses up and down according to the carbs you are going to eat rather than keeping a set dose may help with weight/insulin  skill/ blood sugars in the future.  What do you think?  If you want to work on this more now or anytime, I suggest  you set up another appointment.   Also, Dr. Andrey Campanile mentioned that she needs to see you and asked me to ask you to make an appointment.  [name]  From: Kerney Elbe [[email or web address]] Sent: Friday, December 17, 2009 7:42 AMTo: Victory Dakin, DonnaSubject: Blood glucose results before meals and 2 to 3 hours after meals. Taking 5 units of insuline.     March 15th~ Breakfast 87/150 (67)Lunch 90/173 (83)Dinner 132/133(1)Midnight 92/115~~  March 16th~Breakfast85/157(73)~ Lunch96/100(4)~ Dinner79/ 81(2)~ Midnight90/ 145~~~  March 17th~Breakfast87/120~Lunch93/141~~ Dinner99/ 144~Midnight73

## 2010-11-04 NOTE — Letter (Signed)
Summary: [place]: [facility]  [place]: [facility]   Imported By: Florinda Marker 08/02/2010 10:42:04  _____________________________________________________________________  External Attachment:    Type:   Image     Comment:   External Document

## 2010-11-12 ENCOUNTER — Encounter: Payer: Self-pay | Admitting: Infectious Disease

## 2010-11-23 NOTE — Medication Information (Signed)
Summary: Tax adviser   Imported By: Florinda Marker 11/16/2010 10:17:59  _____________________________________________________________________  External Attachment:    Type:   Image     Comment:   External Document

## 2010-11-24 ENCOUNTER — Ambulatory Visit (HOSPITAL_COMMUNITY)
Admission: RE | Admit: 2010-11-24 | Discharge: 2010-11-24 | Disposition: A | Discharge status: Home / Self Care | Payer: 59 | Source: Ambulatory Visit | Attending: Emergency Medicine | Admitting: Emergency Medicine

## 2010-11-24 ENCOUNTER — Emergency Department (HOSPITAL_COMMUNITY)
Admission: EM | Admit: 2010-11-24 | Discharge: 2010-11-24 | Disposition: A | Discharge status: Home / Self Care | Payer: 59 | Attending: Emergency Medicine | Admitting: Emergency Medicine

## 2010-11-24 ENCOUNTER — Ambulatory Visit (INDEPENDENT_AMBULATORY_CARE_PROVIDER_SITE_OTHER): Payer: 59 | Admitting: Internal Medicine

## 2010-11-24 ENCOUNTER — Encounter: Payer: Self-pay | Admitting: Internal Medicine

## 2010-11-24 ENCOUNTER — Telehealth: Payer: Self-pay | Admitting: *Deleted

## 2010-11-24 VITALS — BP 121/81 | HR 56 | Temp 98.2°F | Ht 74.0 in | Wt 219.5 lb

## 2010-11-24 DIAGNOSIS — E7800 Pure hypercholesterolemia, unspecified: Secondary | ICD-10-CM | POA: Insufficient documentation

## 2010-11-24 DIAGNOSIS — E119 Type 2 diabetes mellitus without complications: Secondary | ICD-10-CM | POA: Insufficient documentation

## 2010-11-24 DIAGNOSIS — L03119 Cellulitis of unspecified part of limb: Secondary | ICD-10-CM

## 2010-11-24 DIAGNOSIS — M7989 Other specified soft tissue disorders: Secondary | ICD-10-CM | POA: Insufficient documentation

## 2010-11-24 DIAGNOSIS — L02419 Cutaneous abscess of limb, unspecified: Secondary | ICD-10-CM

## 2010-11-24 DIAGNOSIS — L988 Other specified disorders of the skin and subcutaneous tissue: Secondary | ICD-10-CM | POA: Insufficient documentation

## 2010-11-24 DIAGNOSIS — I10 Essential (primary) hypertension: Secondary | ICD-10-CM | POA: Insufficient documentation

## 2010-11-24 DIAGNOSIS — Y929 Unspecified place or not applicable: Secondary | ICD-10-CM | POA: Insufficient documentation

## 2010-11-24 DIAGNOSIS — Z86718 Personal history of other venous thrombosis and embolism: Secondary | ICD-10-CM | POA: Insufficient documentation

## 2010-11-24 DIAGNOSIS — Z79899 Other long term (current) drug therapy: Secondary | ICD-10-CM | POA: Insufficient documentation

## 2010-11-24 DIAGNOSIS — X58XXXA Exposure to other specified factors, initial encounter: Secondary | ICD-10-CM | POA: Insufficient documentation

## 2010-11-24 DIAGNOSIS — Z21 Asymptomatic human immunodeficiency virus [HIV] infection status: Secondary | ICD-10-CM | POA: Insufficient documentation

## 2010-11-24 DIAGNOSIS — M79609 Pain in unspecified limb: Secondary | ICD-10-CM | POA: Insufficient documentation

## 2010-11-24 DIAGNOSIS — IMO0002 Reserved for concepts with insufficient information to code with codable children: Secondary | ICD-10-CM | POA: Insufficient documentation

## 2010-11-24 MED ORDER — DOXYCYCLINE HYCLATE 50 MG PO CAPS: 100.0000 mg | ORAL_CAPSULE | Freq: Two times a day (BID) | ORAL | Status: AC

## 2010-11-24 NOTE — Patient Instructions (Signed)
Please make sure to keep your legs elevated at all times to expedite the healing process. Stay hydrated by drinking lots of fluids. Let us know if you have any questions or concerns.

## 2010-11-24 NOTE — Telephone Encounter (Signed)
Vascular lab called, pt had been seen in ED for leg pain, he was sent to vas after his visit to ED, when the vas scan was negative the ED md was called and she was told to send him to ed for another ED visit, she called to see if he could bypass ED and be seen in clinic today, i spoke w/ dr Coralee Pesa, she spoke w/ dr Narda Bonds and pt will be seen this am.

## 2010-11-24 NOTE — Progress Notes (Signed)
  Subjective:    Patient ID: Trevor Fischer, male    DOB: 01-16-1961, 50 y.o.   MRN: [id number]  HPI  Patient is a 50 year old man with HIV being followed at the ID clinic, also has a history of DVT that was provoked secondary to smoking for which she was on Coumadin for one year. Who is presenting today with left medial thigh redness and pain. The patient was evaluated at the emergency department a day prior and had a left lower extremity Doppler done that was negative for DVT, superficial thrombosis or any Baker's cyst. However the patient continues to report left thigh redness that he believes is spreading as well as increased swelling and pain which he describes as a burning sensation. Patient denies any interval history of fever, chills, sweats or knowledge of trauma or insect bite to the area. patient works as a Best boy at [employer] and knows that his job in involves a lot of moving around and pushing heavy equipment.  Review of Systems  Constitutional: Negative for fever and chills.  Respiratory: Negative for shortness of breath.   Cardiovascular: Negative for chest pain and palpitations.  Gastrointestinal: Negative for nausea and vomiting.  Genitourinary: Negative for dysuria.  Neurological: Negative for weakness.       Objective:   Physical Exam  Constitutional: He is oriented to person, place, and time. He appears well-developed and well-nourished. No distress.  Cardiovascular: Normal rate, regular rhythm and normal heart sounds.  Exam reveals no gallop and no friction rub.   No murmur heard. Pulmonary/Chest: Effort normal and breath sounds normal. No respiratory distress. He has no wheezes. He has no rales.  Abdominal: Soft. Bowel sounds are normal. There is no tenderness.  Musculoskeletal: Normal range of motion.       The left medial thigh has an area of about 4cm across that is flat, slightly red, warm and tender to touch. It spreads to just above the back of his  knee. There are no rashes or vesicles present. He does have widespread superficial varicose veins. The entire left thigh appears significantly swollen when compared to the right.  Neurological: He is alert and oriented to person, place, and time.  Psychiatric: He has a normal mood and affect.          Assessment & Plan:

## 2010-11-24 NOTE — Assessment & Plan Note (Signed)
The medial left thigh is swollen and has an area of redness and very warm and very tender to touch. There is no obvious area of trauma or but there is a questionable area of possible insect bite just adjacent to the area of questionable cellulitis. Given that the patient is symptomatic and is in quite a bit of discomfort as well as with obvious signs of inflammation, it's possible that this is a cellulitis from an unknown insect bite.  -  Doxycycline to be taken over a 7 day period -  Patient instructed to keep his left thigh elevated at all times over the next 2 days and encourage bedrest as well. -  Patient encouraged to stay hydrated -  For pain the patient is instructed that he could take ibuprofen to reduce the inflammation and not to exceed 600 mg 4 times daily and to take with food at all times.

## 2010-12-08 NOTE — Medication Information (Signed)
Summary: [facility]:  [facility] Pharmacy:   Imported By: Florinda Marker 11/30/2010 15:52:42  _____________________________________________________________________  External Attachment:    Type:   Image     Comment:   External Document

## 2010-12-14 LAB — GLUCOSE, CAPILLARY: Glucose-Capillary: 154 mg/dL — ABNORMAL HIGH (ref 70–99)

## 2010-12-20 ENCOUNTER — Encounter: Payer: Self-pay | Admitting: Internal Medicine

## 2010-12-20 ENCOUNTER — Telehealth: Payer: Self-pay | Admitting: *Deleted

## 2010-12-20 ENCOUNTER — Ambulatory Visit (INDEPENDENT_AMBULATORY_CARE_PROVIDER_SITE_OTHER): Payer: 59 | Admitting: Internal Medicine

## 2010-12-20 DIAGNOSIS — E119 Type 2 diabetes mellitus without complications: Secondary | ICD-10-CM

## 2010-12-20 DIAGNOSIS — L02419 Cutaneous abscess of limb, unspecified: Secondary | ICD-10-CM

## 2010-12-20 DIAGNOSIS — L03119 Cellulitis of unspecified part of limb: Secondary | ICD-10-CM

## 2010-12-20 LAB — GLUCOSE, CAPILLARY: Glucose-Capillary: 106 mg/dL — ABNORMAL HIGH (ref 70–99)

## 2010-12-20 LAB — POCT GLYCOSYLATED HEMOGLOBIN (HGB A1C): Hemoglobin A1C: 6.2

## 2010-12-20 MED ORDER — CLINDAMYCIN HCL 300 MG PO CAPS: 300.0000 mg | ORAL_CAPSULE | Freq: Three times a day (TID) | ORAL | Status: DC

## 2010-12-20 NOTE — Telephone Encounter (Signed)
Pt calls to report that L thigh area is worse than at previous visit, it is more red, swollen and painful at 4-5/10. i spoke w/ dr Cena Benton and dr Aundria Rud, pt will be here at 1315 and will be seen by dr Cena Benton or possibly one of the attendings, pt is agreeable and understands he may have to wait. [facility] is notified to have pt placed on dr vega's schedule.

## 2010-12-20 NOTE — Assessment & Plan Note (Signed)
Unresolved cellulitis. No evidence to suggest necrotizing fasciitis. Patient will be instructed to start taking clindamycin 300 mg three times a day for 7 days. Patient was instructed to take probiotics as there is an increased incidence of C. Diff colitis with clindamycin. Close follow up in one week. Instructed to call clinic or go to the ED if symptoms worsened.

## 2010-12-20 NOTE — Patient Instructions (Signed)
Make a follow up appointment in 1 week. Start taking probiotics while using antibiotics.

## 2010-12-20 NOTE — Progress Notes (Signed)
  Subjective:    Patient ID: Trevor Fischer, male    DOB: 23-Aug-1961, 50 y.o.   MRN: [id number]  HPI  50 yr old man with  Past Medical History  Diagnosis Date  . 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 Hyperlipidemia   . Degenerative joint disease of knee   . HIV infection   . Hypertension   . GERD (gastroesophageal reflux disease)   . Hx MRSA infection   . Allergic rhinitis   . Superficial thrombophlebitis   . Diabetes mellitus 9/09    comes to the clinic complaining left thigh redness, and warm. Patient was seen in the end of February for left thigh cellulitis because of history of DVT, lower extremity dopplers where done which excluded DVT. Patient was placed on doxycycline for 10 days. Reports that cellulitis improved but never went away completely. He completed his course of antibiotics about 2 weeks ago. Reports that yesterday his thigh started to get warm, red, and swollen. Patient reports that pain is mostly felt on palpation and described to be 5/10 intensity. Denies fever/chills, shortness of breath, chest pain, palpitations, diaphoresis, n/v/d, or abdominal pain.  Review of Systems  [all other systems reviewed and are negative       Objective:   Physical Exam  [vitalsreviewed. Constitutional: He is oriented to person, place, and time. He appears well-developed and well-nourished. No distress.  Cardiovascular: Normal rate, regular rhythm and normal heart sounds.  Exam reveals no gallop and no friction rub.   No murmur heard. Pulmonary/Chest: Effort normal and breath sounds normal. No respiratory distress. He has no wheezes. He has no rales.  Abdominal: Soft. Bowel sounds are normal. There is no tenderness.  Musculoskeletal: Normal range of motion.       The left medial thigh has an area of about 3cm across that is flat, red, warm and tender to touch.There are no rashes or vesicles present. He does have widespread superficial varicose veins. The left medial thigh  appears swollen when compared to the right. No crepitus noted.  Neurological: He is alert and oriented to person, place, and time.  Psychiatric: He has a normal mood and affect.          Assessment & Plan:

## 2010-12-21 LAB — GLUCOSE, CAPILLARY: Glucose-Capillary: 146 mg/dL — ABNORMAL HIGH (ref 70–99)

## 2010-12-23 LAB — T-HELPER CELL (CD4) - (RCID CLINIC ONLY): CD4 % Helper T Cell: 37 % (ref 33–55)

## 2010-12-27 ENCOUNTER — Ambulatory Visit (INDEPENDENT_AMBULATORY_CARE_PROVIDER_SITE_OTHER): Payer: 59 | Admitting: Internal Medicine

## 2010-12-27 ENCOUNTER — Encounter: Payer: Self-pay | Admitting: Internal Medicine

## 2010-12-27 VITALS — BP 123/71 | HR 65 | Temp 98.0°F | Ht 74.0 in | Wt 218.2 lb

## 2010-12-27 DIAGNOSIS — E119 Type 2 diabetes mellitus without complications: Secondary | ICD-10-CM

## 2010-12-27 DIAGNOSIS — L03119 Cellulitis of unspecified part of limb: Secondary | ICD-10-CM

## 2010-12-27 DIAGNOSIS — L02419 Cutaneous abscess of limb, unspecified: Secondary | ICD-10-CM

## 2010-12-27 MED ORDER — CLINDAMYCIN HCL 300 MG PO CAPS: 300.0000 mg | ORAL_CAPSULE | Freq: Three times a day (TID) | ORAL | Status: AC

## 2010-12-27 NOTE — Patient Instructions (Signed)
Make a follow up appointment in 2 weeks. Continue taking Clindamycin for 5 days. Take all other medication as directed.

## 2010-12-29 ENCOUNTER — Encounter: Payer: 59 | Admitting: Internal Medicine

## 2011-01-02 NOTE — Assessment & Plan Note (Signed)
Improved but has not resolved. Will continue clindamycin for total of 10 days. No evidence on exam to suggest necrotizing fasciitis. Will follow up.

## 2011-01-02 NOTE — Progress Notes (Signed)
  Subjective:    Patient ID: Trevor Fischer, male    DOB: 1960-11-11, 50 y.o.   MRN: [id number]  HPI  50 yr old man with  Past Medical History  Diagnosis Date  . 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 Hyperlipidemia   . Degenerative joint disease of knee   . HIV infection   . Hypertension   . GERD (gastroesophageal reflux disease)   . Hx MRSA infection   . Allergic rhinitis   . Superficial thrombophlebitis   . Diabetes mellitus 9/09   comes to the clinic for follow up of left thigh cellulitis. Patient reports that redness and pain have improved but erythema has not completely resolved. Denies increased pain, fever/chills, chest pain, palpitations, lower extremity swelling, or shortness of breath.   Review of Systems  All other systems reviewed and are negative.       Objective:   Physical Exam  Vitals reviewed. Constitutional: He is oriented to person, place, and time. He appears well-developed and well-nourished. No distress.  Cardiovascular: Normal rate, regular rhythm and normal heart sounds.  Exam reveals no gallop and no friction rub.   No murmur heard. Pulmonary/Chest: Effort normal and breath sounds normal. No respiratory distress. He has no wheezes. He has no rales.  Abdominal: Soft. Bowel sounds are normal. There is no tenderness.  Musculoskeletal: Normal range of motion.       The left medial thigh has an area of about 1cm across that is flat, red, warm and tender to touch.There are no rashes or vesicles present. He does have widespread superficial varicose veins. No crepitus noted.  Neurological: He is alert and oriented to person, place, and time.  Psychiatric: He has a normal mood and affect.          Assessment & Plan:

## 2011-01-06 LAB — T-HELPER CELL (CD4) - (RCID CLINIC ONLY): CD4 % Helper T Cell: 38 % (ref 33–55)

## 2011-01-09 ENCOUNTER — Other Ambulatory Visit: Payer: Self-pay | Admitting: Infectious Disease

## 2011-01-09 DIAGNOSIS — B20 Human immunodeficiency virus [HIV] disease: Secondary | ICD-10-CM

## 2011-01-09 DIAGNOSIS — E119 Type 2 diabetes mellitus without complications: Secondary | ICD-10-CM

## 2011-01-09 LAB — GLUCOSE, CAPILLARY: Glucose-Capillary: 81 mg/dL (ref 70–99)

## 2011-01-10 LAB — T-HELPER CELL (CD4) - (RCID CLINIC ONLY): CD4 T Cell Abs: 620 uL (ref 400–2700)

## 2011-01-11 ENCOUNTER — Ambulatory Visit (INDEPENDENT_AMBULATORY_CARE_PROVIDER_SITE_OTHER): Payer: 59 | Admitting: Internal Medicine

## 2011-01-11 ENCOUNTER — Encounter: Payer: Self-pay | Admitting: Internal Medicine

## 2011-01-11 DIAGNOSIS — E119 Type 2 diabetes mellitus without complications: Secondary | ICD-10-CM

## 2011-01-11 DIAGNOSIS — L03119 Cellulitis of unspecified part of limb: Secondary | ICD-10-CM

## 2011-01-11 NOTE — Assessment & Plan Note (Signed)
Resolved. No signs of infection at this time.

## 2011-01-11 NOTE — Assessment & Plan Note (Signed)
Well-controlled.  Continue current regimen. 

## 2011-01-11 NOTE — Progress Notes (Signed)
  Subjective:    Patient ID: Trevor Fischer, male    DOB: August 14, 1961, 50 y.o.   MRN: [id number]  HPI 50 year old male here for followup. History of HIV disease, diabetes, hypertension, he had suffered from cellulitis of thigh. He was given antibiotics for that. Here for followup and reports that he has no other symptoms. The cellulitis is completely resolved.   Review of Systems  All other systems reviewed and are negative.       Objective:   Physical Exam BP 121/84  Pulse 71  Temp(Src) 98.4 F (36.9 C) (Oral)  Ht 6\' 2"  (1.88 m)  Wt 220 lb 8 oz (100.018 kg)  BMI 28.31 kg/m2  General Appearance:    Alert, cooperative, no distress, appears stated age  Head:    Normocephalic, without obvious abnormality, atraumatic  Eyes:    PERRL, conjunctiva/corneas clear, EOM's intact, fundi    benign, both eyes       Ears:    Normal TM's and external ear canals, both ears  Nose:   Nares normal, septum midline, mucosa normal, no drainage   or sinus tenderness  Throat:   Lips, mucosa, and tongue normal; teeth and gums normal  Neck:   Supple, symmetrical, trachea midline, no adenopathy;       thyroid:  No enlargement/tenderness/nodules; no carotid   bruit or JVD  Back:     Symmetric, no curvature, ROM normal, no CVA tenderness  Lungs:     Clear to auscultation bilaterally, respirations unlabored  Chest wall:    No tenderness or deformity  Heart:    Regular rate and rhythm, S1 and S2 normal, no murmur, rub   or gallop  Abdomen:     Soft, non-tender, bowel sounds active all four quadrants,    no masses, no organomegaly  Extremities:   Extremities normal, atraumatic, no cyanosis or edema  Pulses:   2+ and symmetric all extremities  Skin:   Skin color, texture, turgor normal, no rashes or lesions  Lymph nodes:   Cervical, supraclavicular, and axillary nodes normal  Neurologic:   CNII-XII intact. Normal strength, sensation and reflexes      throughout          Assessment & Plan:

## 2011-01-12 ENCOUNTER — Other Ambulatory Visit: Payer: Self-pay | Admitting: Infectious Disease

## 2011-01-12 DIAGNOSIS — I10 Essential (primary) hypertension: Secondary | ICD-10-CM

## 2011-01-16 ENCOUNTER — Other Ambulatory Visit: Payer: Self-pay | Admitting: Infectious Disease

## 2011-01-16 DIAGNOSIS — E785 Hyperlipidemia, unspecified: Secondary | ICD-10-CM

## 2011-01-16 LAB — T-HELPER CELL (CD4) - (RCID CLINIC ONLY): CD4 % Helper T Cell: 37 % (ref 33–55)

## 2011-01-19 ENCOUNTER — Encounter: Payer: Self-pay | Admitting: Internal Medicine

## 2011-02-13 ENCOUNTER — Other Ambulatory Visit: Payer: Self-pay | Admitting: Infectious Disease

## 2011-02-13 DIAGNOSIS — I10 Essential (primary) hypertension: Secondary | ICD-10-CM

## 2011-02-20 ENCOUNTER — Telehealth: Payer: Self-pay | Admitting: Infectious Disease

## 2011-02-20 NOTE — Telephone Encounter (Signed)
Tamika can you find out when Kerney Elbe had his flu shot? 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 If you find the date can you update his immunization to reflect this? Thanks

## 2011-02-21 NOTE — Telephone Encounter (Signed)
He had it done on 07/2010 with [facility] and I put it in his historical list

## 2011-03-02 ENCOUNTER — Other Ambulatory Visit: Payer: Self-pay | Admitting: *Deleted

## 2011-03-02 DIAGNOSIS — B20 Human immunodeficiency virus [HIV] disease: Secondary | ICD-10-CM

## 2011-03-02 MED ORDER — ASPIRIN 81 MG PO TABS: 81.0000 mg | ORAL_TABLET | Freq: Every day | ORAL | Status: DC

## 2011-03-09 ENCOUNTER — Encounter: Payer: Self-pay | Admitting: Internal Medicine

## 2011-03-09 ENCOUNTER — Ambulatory Visit (INDEPENDENT_AMBULATORY_CARE_PROVIDER_SITE_OTHER): Payer: 59 | Admitting: Internal Medicine

## 2011-03-09 DIAGNOSIS — E785 Hyperlipidemia, unspecified: Secondary | ICD-10-CM

## 2011-03-09 DIAGNOSIS — B20 Human immunodeficiency virus [HIV] disease: Secondary | ICD-10-CM

## 2011-03-09 DIAGNOSIS — I10 Essential (primary) hypertension: Secondary | ICD-10-CM

## 2011-03-09 DIAGNOSIS — E119 Type 2 diabetes mellitus without complications: Secondary | ICD-10-CM

## 2011-03-09 DIAGNOSIS — I809 Phlebitis and thrombophlebitis of unspecified site: Secondary | ICD-10-CM | POA: Insufficient documentation

## 2011-03-09 LAB — LIPID PANEL
Cholesterol: 166 mg/dL (ref 0–200)
Total CHOL/HDL Ratio: 3.9 Ratio
Triglycerides: 89 mg/dL (ref <150)

## 2011-03-09 LAB — COMPREHENSIVE METABOLIC PANEL
AST: 65 U/L — ABNORMAL HIGH (ref 0–37)
Albumin: 4.5 g/dL (ref 3.5–5.2)
Alkaline Phosphatase: 114 U/L (ref 39–117)
BUN: 20 mg/dL (ref 6–23)
Calcium: 8.9 mg/dL (ref 8.4–10.5)
Chloride: 101 mEq/L (ref 96–112)
Glucose, Bld: 93 mg/dL (ref 70–99)
Potassium: 4.2 mEq/L (ref 3.5–5.3)
Total Bilirubin: 0.4 mg/dL (ref 0.3–1.2)

## 2011-03-09 LAB — GLUCOSE, CAPILLARY: Glucose-Capillary: 98 mg/dL (ref 70–99)

## 2011-03-09 MED ORDER — NAPROXEN 500 MG PO TABS: 500.0000 mg | ORAL_TABLET | Freq: Two times a day (BID) | ORAL | Status: AC

## 2011-03-09 NOTE — Patient Instructions (Signed)
Follow up in 2 months. Use compression stocking, elevated left leg, use warm compresses.

## 2011-03-09 NOTE — Assessment & Plan Note (Addendum)
Recurrent. Will have him elevated left leg, use compression stockings, warm compresses, and NSAIDS. Due to recurrence will referr to Vascular surgery for possible vein excision. Instructed to return to clinic if symptoms worsened as there is a small association with DVT. No evidence to suggest DVT at this time.

## 2011-03-10 ENCOUNTER — Encounter: Payer: Self-pay | Admitting: Internal Medicine

## 2011-03-10 NOTE — Progress Notes (Signed)
  Subjective:    Patient ID: Trevor Fischer, male    DOB: Feb 28, 1961, 50 y.o.   MRN: [id number]  HPI  50 yr old man with  Past Medical History  Diagnosis Date  . 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 Hyperlipidemia   . Degenerative joint disease of knee   . HIV infection   . Hypertension   . GERD (gastroesophageal reflux disease)   . Hx MRSA infection   . Allergic rhinitis   . Superficial thrombophlebitis   . Diabetes mellitus 9/09   comes to the clinic for regular check up of Hypertension, Diabetes and Hyperlipidemia. Patient reports to be taking all medication as directed.  Complains about having pain along one of his veins on the left lower extremity. Patient has been having this problem recurrently for the last 6 months. Denies any calf pain, redness, or swelling. Area is tender to touch and located below the knee. Patient has taken ibuprofen which is helping. Denies chest pain, shortness of breath, palpitations, diaphoresis, fever/chills, trauma, immobility, recent surgery, n/v, diarrhea.   Review of Systems  All other systems reviewed and are negative.       Objective:   Physical Exam Vitals reviewed. Constitutional: He is oriented to person, place, and time. He appears well-developed and well-nourished. No distress.  Cardiovascular: Normal rate, regular rhythm and normal heart sounds.  Exam reveals no gallop and no friction rub.   No murmur heard. Pulmonary/Chest: Effort normal and breath sounds normal. No respiratory distress. He has no wheezes. He has no rales.  Abdominal: Soft. Bowel sounds are normal. There is no tenderness.  Musculoskeletal: Normal range of motion. No calf pain, or swelling bilaterally. Tenderness to palpation cordlike area on mid tibial region of left leg. Area of tenderness spans about 2 inches, no erythema or increased warmth. Tenderness to palpation on left lateral ankle measuring about 1 in, no erythema or increased warmth. He does have  widespread superficial varicose veins.  Neurological: He is alert and oriented to person, place, and time.  Psychiatric: He has a normal mood and affect.         Assessment & Plan:

## 2011-03-10 NOTE — Assessment & Plan Note (Signed)
Stable. Continue current regimen. Check renal function. 

## 2011-03-10 NOTE — Assessment & Plan Note (Signed)
Per ID 

## 2011-03-10 NOTE — Assessment & Plan Note (Addendum)
At goal. Continue current regimen. Diabetic foot exam done today.

## 2011-03-10 NOTE — Assessment & Plan Note (Signed)
At goal. Continue current regimen. 

## 2011-03-13 NOTE — Telephone Encounter (Signed)
Thanks Tamika. 

## 2011-03-20 ENCOUNTER — Encounter (INDEPENDENT_AMBULATORY_CARE_PROVIDER_SITE_OTHER): Payer: 59 | Admitting: Vascular Surgery

## 2011-03-20 ENCOUNTER — Encounter: Payer: Self-pay | Admitting: Vascular Surgery

## 2011-03-20 DIAGNOSIS — I8000 Phlebitis and thrombophlebitis of superficial vessels of unspecified lower extremity: Secondary | ICD-10-CM

## 2011-03-20 NOTE — Consult Note (Signed)
NEW PATIENT CONSULTATION  Trevor Fischer, Trevor Fischer DOB:  [birth year]                                       03/20/2011 CHART#:[id number]  The patient is a 50 year old male with a history of having a laser ablation procedure done on his right lower extremity about 10 years ago at [facility].  He has a remote history of some type of clot in his left leg in the past, which did require Coumadin therapy for 1 year, but he does not know for sure that this was a DVT.  Recently he has been having episodes of superficial thrombophlebitis where he develops pain and a dark streak in his leg on the left side between the knee and the ankle.  He also has aching, throbbing, and burning discomfort and has been wearing a long-leg elastic compression stocking (20 mm - 30 mm) prescribed by Dr. Cena Benton a few weeks ago.  This has improved his symptoms but not relieved them completely.  He has no history of stasis ulcers, bleeding, or other complications.  He does state that symptoms are worsening.  CHRONIC MEDICAL PROBLEMS: 1. Hyperlipidemia. 2. A history of HIV infection. 3. Hypertension. 4. GERD. 5. Diabetes mellitus type 1. 6. Negative for coronary artery disease, COPD or stroke.  SOCIAL HISTORY:  He is single, works as a Agricultural engineer.  Does not use tobacco, quit 5 years ago.  Does not use alcohol.  FAMILY HISTORY:  Positive for diabetes in his father, renal failure in his father.  Negative for stroke for coronary artery disease.  REVIEW OF SYSTEMS:  Positive for his lower extremity symptoms as noted above.  Denies any chest pain, dyspnea on exertion, PND, orthopnea.  No claudication.  Denies any GI or GU symptoms.  All systems are negative in complete review of systems.  PHYSICAL EXAM:  Blood pressure 136/75, heart rate 68, respirations 16. General:  He is a well-developed, well-nourished male in no apparent distress, alert and oriented x3.  HEENT:  Exam normal for  age.  EOMs intact.  Lungs:  Clear to auscultation.  No rhonchi or wheezing. Cardiovascular:  Regular rhythm.  No murmurs.  Carotid pulses 3+ no bruits.  Abdomen:  Soft, nontender with no masses.  Musculoskeletal: Exam is free of major deformities.  Neurologic:  Normal.  Skin:  Exam reveals superficial varicosities in the left leg in the pretibial region and also in the popliteal fossa.  He has some reticular and spider veins in the medial and lateral ankle areas where some of his discomfort has occurred.  He has 1+ edema.  Right leg is free of varicosities.  This patient appears to have venous insufficiency of the left leg with a history of recurrent thrombophlebitis.  Schedule him for 3 months of long-leg elastic compression stockings (20 mm - 30 mm gradient) as well as elevation and ibuprofen, and in 3 months when he returns, will obtain a formal venous duplex exam to look for reflux in the left leg.  He did have a venous study performed in February at [facility] which revealed no DVT but it was not a complete reflux study.    Quita Skye Hart [place], M.D. Electronically Signed  JDL/MEDQ  D:  03/20/2011  T:  03/20/2011  Job:  [id number]  cc:   Danne Harbor, MD

## 2011-04-10 ENCOUNTER — Other Ambulatory Visit: Payer: Self-pay | Admitting: Infectious Disease

## 2011-05-04 ENCOUNTER — Encounter: Payer: Self-pay | Admitting: Vascular Surgery

## 2011-05-23 ENCOUNTER — Encounter: Payer: Self-pay | Admitting: Vascular Surgery

## 2011-05-25 ENCOUNTER — Encounter: Payer: Self-pay | Admitting: Vascular Surgery

## 2011-06-12 ENCOUNTER — Other Ambulatory Visit (INDEPENDENT_AMBULATORY_CARE_PROVIDER_SITE_OTHER): Payer: 59

## 2011-06-12 DIAGNOSIS — B20 Human immunodeficiency virus [HIV] disease: Secondary | ICD-10-CM

## 2011-06-12 DIAGNOSIS — E785 Hyperlipidemia, unspecified: Secondary | ICD-10-CM

## 2011-06-12 DIAGNOSIS — E119 Type 2 diabetes mellitus without complications: Secondary | ICD-10-CM

## 2011-06-13 ENCOUNTER — Other Ambulatory Visit: Payer: Self-pay | Admitting: *Deleted

## 2011-06-13 LAB — CBC WITH DIFFERENTIAL/PLATELET
Basophils Absolute: 0 10*3/uL (ref 0.0–0.1)
Basophils Relative: 1 % (ref 0–1)
MCHC: 32.8 g/dL (ref 30.0–36.0)
Neutro Abs: 3 10*3/uL (ref 1.7–7.7)
Neutrophils Relative %: 59 % (ref 43–77)
RDW: 13.4 % (ref 11.5–15.5)

## 2011-06-13 LAB — COMPREHENSIVE METABOLIC PANEL
ALT: 58 U/L — ABNORMAL HIGH (ref 0–53)
AST: 34 U/L (ref 0–37)
Albumin: 4.4 g/dL (ref 3.5–5.2)
BUN: 22 mg/dL (ref 6–23)
Calcium: 9.4 mg/dL (ref 8.4–10.5)
Chloride: 102 mEq/L (ref 96–112)
Potassium: 4.3 mEq/L (ref 3.5–5.3)

## 2011-06-13 LAB — HEMOGLOBIN A1C: Mean Plasma Glucose: 146 mg/dL — ABNORMAL HIGH (ref <117)

## 2011-06-13 LAB — T-HELPER CELL (CD4) - (RCID CLINIC ONLY): CD4 T Cell Abs: 480 uL (ref 400–2700)

## 2011-06-13 LAB — HIV-1 RNA QUANT-NO REFLEX-BLD
HIV 1 RNA Quant: <20 copies/mL (ref <20)
HIV-1 RNA Quant, Log: <1.3 {Log} (ref <1.30)

## 2011-06-13 MED ORDER — INSULIN GLARGINE 100 UNIT/ML ~~LOC~~ SOLN: 23.0000 [IU] | Freq: Every day | SUBCUTANEOUS | Status: DC

## 2011-06-13 MED ORDER — GLUCOSE BLOOD VI STRP: ORAL_STRIP | Status: DC

## 2011-06-13 NOTE — Telephone Encounter (Signed)
Request is for accu-chek aviva test strips

## 2011-06-19 ENCOUNTER — Ambulatory Visit: Payer: 59 | Admitting: Vascular Surgery

## 2011-06-26 ENCOUNTER — Encounter: Payer: Self-pay | Admitting: Infectious Disease

## 2011-06-26 ENCOUNTER — Encounter: Payer: Self-pay | Admitting: Vascular Surgery

## 2011-06-26 ENCOUNTER — Ambulatory Visit (INDEPENDENT_AMBULATORY_CARE_PROVIDER_SITE_OTHER): Payer: 59 | Admitting: Infectious Disease

## 2011-06-26 VITALS — BP 132/83 | HR 77 | Temp 98.5°F | Ht 74.0 in | Wt 217.0 lb

## 2011-06-26 DIAGNOSIS — B20 Human immunodeficiency virus [HIV] disease: Secondary | ICD-10-CM

## 2011-06-26 DIAGNOSIS — E119 Type 2 diabetes mellitus without complications: Secondary | ICD-10-CM

## 2011-06-26 DIAGNOSIS — E785 Hyperlipidemia, unspecified: Secondary | ICD-10-CM

## 2011-06-26 MED ORDER — ROSUVASTATIN CALCIUM 40 MG PO TABS: 40.0000 mg | ORAL_TABLET | Freq: Every day | ORAL | Status: DC

## 2011-06-26 NOTE — Assessment & Plan Note (Signed)
Continue to followup in [facility] and with Jamison Neighbor

## 2011-06-26 NOTE — Progress Notes (Signed)
  Subjective:    Patient ID: Trevor Fischer, male    DOB: 1961-02-21, 50 y.o.   MRN: [id number]  HPI  50 yo [place] American male with HIV perfectly suppressed on atripla also with diabetes returns for followup in clinic.  He is doing well. He is accompanied by his partner who was also a patient in clinic today.  We addressed several concnerns today  #1 lipodystrophy. I am not eager to rx egrifta due to his being diabetic and potential for increasing IGF and furthermore because this is not a permanent solution to this issue. I instead encourage diet (low carbohydrate) and exercise  #2 Hyperlipidemia: He is not at goal and so therefore will increase the statin  Otherwise he is doing well We spent greater than 45 minutes with Cristal Deer including greater than 50% of time in face to face counselling.  Review of Systems  Constitutional: Negative for fever, chills, diaphoresis, activity change, appetite change, fatigue and unexpected weight change.  HENT: Negative for congestion, sore throat, rhinorrhea, sneezing, trouble swallowing and sinus pressure.   Eyes: Negative for photophobia and visual disturbance.  Respiratory: Negative for cough, chest tightness, shortness of breath, wheezing and stridor.   Cardiovascular: Negative for chest pain, palpitations and leg swelling.  Gastrointestinal: Negative for nausea, vomiting, abdominal pain, diarrhea, constipation, blood in stool, abdominal distention and anal bleeding.  Genitourinary: Negative for dysuria, hematuria, flank pain and difficulty urinating.  Musculoskeletal: Negative for myalgias, back pain, joint swelling, arthralgias and gait problem.  Skin: Negative for color change, pallor, rash and wound.  Neurological: Negative for dizziness, tremors, weakness and light-headedness.  Hematological: Negative for adenopathy. Does not bruise/bleed easily.  Psychiatric/Behavioral: Negative for behavioral problems, confusion, sleep disturbance,  dysphoric mood, decreased concentration and agitation.       Objective:   Physical Exam  Constitutional: He is oriented to person, place, and time. He appears well-developed and well-nourished. No distress.  HENT:  Head: Normocephalic and atraumatic.  Mouth/Throat: Oropharynx is clear and moist. No oropharyngeal exudate.  Eyes: Conjunctivae and EOM are normal. Pupils are equal, round, and reactive to light. No scleral icterus.  Neck: Normal range of motion. Neck supple. No JVD present.  Cardiovascular: Normal rate, regular rhythm and normal heart sounds.  Exam reveals no gallop and no friction rub.   No murmur heard. Pulmonary/Chest: Effort normal and breath sounds normal. No respiratory distress. He has no wheezes. He has no rales. He exhibits no tenderness.  Abdominal: He exhibits no distension and no mass. There is no tenderness. There is no rebound and no guarding.  Musculoskeletal: He exhibits no edema and no tenderness.  Lymphadenopathy:    He has no cervical adenopathy.  Neurological: He is alert and oriented to person, place, and time. He has normal reflexes. He exhibits normal muscle tone. Coordination normal.  Skin: Skin is warm and dry. He is not diaphoretic. No erythema. No pallor.  Psychiatric: He has a normal mood and affect. His behavior is normal. Judgment and thought content normal.          Assessment & Plan:  HIV DISEASE Continue atripla. Stribild is another viable option for him  DM Continue to followup in [facility] and with Jamison Neighbor  HYPERLIPIDEMIA Increase crestor

## 2011-06-26 NOTE — Assessment & Plan Note (Signed)
Continue atripla. Stribild is another viable option for him

## 2011-06-26 NOTE — Assessment & Plan Note (Signed)
Increase crestor

## 2011-06-27 ENCOUNTER — Ambulatory Visit (INDEPENDENT_AMBULATORY_CARE_PROVIDER_SITE_OTHER): Payer: 59 | Admitting: Vascular Surgery

## 2011-06-27 ENCOUNTER — Encounter (INDEPENDENT_AMBULATORY_CARE_PROVIDER_SITE_OTHER): Payer: 59 | Admitting: *Deleted

## 2011-06-27 ENCOUNTER — Encounter: Payer: Self-pay | Admitting: Vascular Surgery

## 2011-06-27 VITALS — BP 134/87 | HR 67 | Resp 20 | Ht 74.0 in | Wt 217.0 lb

## 2011-06-27 DIAGNOSIS — I83893 Varicose veins of bilateral lower extremities with other complications: Secondary | ICD-10-CM

## 2011-06-27 DIAGNOSIS — I8310 Varicose veins of unspecified lower extremity with inflammation: Secondary | ICD-10-CM

## 2011-06-27 NOTE — Progress Notes (Signed)
3 month fu vv. Duplex today.

## 2011-06-27 NOTE — Progress Notes (Signed)
Subjective:     Patient ID: Trevor Fischer, male   DOB: 10-Jan-1961, 50 y.o.   MRN: [id number]  HPI this 50 year old male patient returns today for further followup regarding his saphenous insufficiency of the left leg. He has a history of thrombophlebitis in the left leg between the knee and ankle aching throbbing and burning discomfort. He has a remote history of thrombophlebitis in the right leg and underwent laser ablation of the right great saphenous vein about 10 years ago. He has tried long elastic compression stockings (20 mm-30 mm) as well as elevation and ibuprofen without success. He continues to be symptomatic.  Today I ordered a lower extremity venous duplex exam of the left leg which are reviewed and interpreted. Has no DVT in the left leg. He has gross reflux in the left great saphenous vein from the junction to the knee supplying the area of GSv thrombosed between the knee and ankle. Next   Review of Systems     Objective:   Physical Exam blood pressure 134 bradycardia 7 heart rate 67 respirations 20 General he is alert and oriented x3 no apparent distress Left lower extremity exam reveals diffuse spider and reticular veins from the knee to the ankle with 1+ edema. He has a very prominent varicosity across the patella. He has evidence of previous superficial thrombophlebitis in the left great saphenous vein from the knee to the ankle.     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

## 2011-06-28 ENCOUNTER — Encounter: Payer: 59 | Admitting: Internal Medicine

## 2011-06-29 LAB — T-HELPER CELL (CD4) - (RCID CLINIC ONLY): CD4 % Helper T Cell: 39

## 2011-07-03 LAB — BASIC METABOLIC PANEL
CO2: 27
Chloride: 101
Creatinine, Ser: 1.04
GFR calc Af Amer: >60

## 2011-07-03 LAB — URINALYSIS, ROUTINE W REFLEX MICROSCOPIC
Bilirubin Urine: NEGATIVE
Glucose, UA: >1000 — AB
Hgb urine dipstick: NEGATIVE
Ketones, ur: NEGATIVE
Leukocytes, UA: NEGATIVE
Protein, ur: NEGATIVE
pH: 7.5

## 2011-07-03 LAB — GLUCOSE, CAPILLARY: Glucose-Capillary: 303 — ABNORMAL HIGH

## 2011-07-03 LAB — T-HELPER CELL (CD4) - (RCID CLINIC ONLY): CD4 T Cell Abs: 690

## 2011-07-03 LAB — INSULIN, RANDOM: Insulin: 5

## 2011-07-03 NOTE — Procedures (Unsigned)
LOWER EXTREMITY VENOUS REFLUX EXAM  INDICATION:  Left varicose vein.  EXAM:  Using color-flow imaging and pulse Doppler spectral analysis, the left common femoral, superficial femoral, popliteal, posterior tibial, greater and lesser saphenous veins are evaluated.  There is evidence suggesting deep venous insufficiency in the left lower extremity at the level of the common femoral vein.  The left saphenofemoral junction is not competent with reflux of >500 milliseconds. The left GSV is not competent with Reflux of >543milliseconds with the caliber as described below.  The left proximal short saphenous vein demonstrates competency.  GSV Diameter (used if found to be incompetent only)                                           Right    Left Proximal Greater Saphenous Vein           cm       0.62 cm Proximal-to-mid-thigh                     cm       0.36 cm Mid thigh                                 cm       0.38 cm Mid-distal thigh                          cm       cm Distal thigh                              cm       0.30 cm Knee                                      cm       0.36 cm  IMPRESSION: 1. The left greater saphenous vein is not competent with reflux     >532milliseconds. 2. The left greater saphenous vein is not tortuous. 3. The deep venous system is not competent with Reflux of     >544milliseconds at the level of the common femoral vein. 4. The left lesser saphenous vein is competent. 5. Thrombus noted in the proximal to mid left small saphenous vein. 6. Thrombus noted in the left great saphenous vein from the knee to     the ankle.        ___________________________________________ Quita Skye. Hart Rochester, M.D.  EM/MEDQ  D:  06/27/2011  T:  06/27/2011  Job:  [id number]

## 2011-07-11 ENCOUNTER — Other Ambulatory Visit: Payer: Self-pay | Admitting: *Deleted

## 2011-07-11 DIAGNOSIS — I83893 Varicose veins of bilateral lower extremities with other complications: Secondary | ICD-10-CM

## 2011-07-13 LAB — T-HELPER CELL (CD4) - (RCID CLINIC ONLY): CD4 T Cell Abs: 580

## 2011-07-17 ENCOUNTER — Encounter: Payer: Self-pay | Admitting: Vascular Surgery

## 2011-07-17 ENCOUNTER — Ambulatory Visit (INDEPENDENT_AMBULATORY_CARE_PROVIDER_SITE_OTHER): Payer: 59 | Admitting: Vascular Surgery

## 2011-07-17 VITALS — BP 133/84 | HR 60 | Resp 16 | Ht 74.0 in | Wt 218.0 lb

## 2011-07-17 DIAGNOSIS — I83893 Varicose veins of bilateral lower extremities with other complications: Secondary | ICD-10-CM

## 2011-07-17 NOTE — Progress Notes (Signed)
Subjective:     Patient ID: Trevor Fischer, male   DOB: [birth year], 50 y.o.   MRN: [id number]  HPI this 50 year old male patient had a history of recurrent thrombophlebitis in the left leg below the knee over the last few years. He had aching throbbing and burning discomfort from venous hypertension due to valvular incompetence with bulging varicosities. Today he underwent laser ablation of the left great saphenous vein under local tumescent anesthesia. Initially the guidewire was inserted in the proximal calf into the GSV but it would only traversed the vein up to the mid to proximal third of the thigh. There was an area of partial obstruction in this area which would not allow the wire to traverse it. He therefore had 2 laser ablation procedures--- #1 the proximal G. SVG to the saphenofemoral junction using 690 J #2 the distal GSV to the mid to proximal thigh using 1406 J. This was all done under local tumescent anesthesia. He then had between 10 and 20 stab phlebectomy of secondary varicosities in the thigh and calf and pretibial areas as well as the ankle. He tolerated these procedures well. A local compression dressing from the foot to the groin was then applied .  Review of Systems     Objective:   Physical Exam blood pressure 133 range for heart rate 60 respirations 16     Assessment:    successful completion of 2 laser ablation procedures left GSV. #1 proximal GSV----#2 distal GSV----#310-20 stab phlebectomy left leg    Plan:    return in one week for venous duplex exam to confirm closure of left great saphenous vein Long-leg elastic compression stockings for 48 hours and then daytime only for 2 weeks Elevate leg as necessary Ibuprofen 200 mg 3 tablets 3 times a day for 7 day

## 2011-07-18 ENCOUNTER — Telehealth: Payer: Self-pay | Admitting: *Deleted

## 2011-07-18 NOTE — Telephone Encounter (Signed)
Mr. Tapp states he is doing extremely well.  No complaints of pain, swelling, or bleeding/oozing form left leg.  Reminded him to wear compression dressing for 48 hours and then compression hose daytime for 2 weeks. Reminded him of follow up appointment with Dr. Hart Rochester and ultrasound on 07-24-2011.  Rankin, Neena Rhymes

## 2011-07-19 ENCOUNTER — Telehealth: Payer: Self-pay | Admitting: Vascular Surgery

## 2011-07-19 ENCOUNTER — Other Ambulatory Visit: Payer: Self-pay | Admitting: *Deleted

## 2011-07-19 ENCOUNTER — Telehealth: Payer: Self-pay | Admitting: *Deleted

## 2011-07-19 NOTE — Telephone Encounter (Signed)
Spoke with Desma Mcgregor RN on 07-19-2011 at [phone] ext. [phone] regarding updating original UMR/Care Management  Authorization # [id number].  Desma Mcgregor added CPT code 21308 to original authorization # [id number] with dates of service 07-07-11--08-06-2011.  Nelson Julson, Neena Rhymes

## 2011-07-24 ENCOUNTER — Encounter: Payer: Self-pay | Admitting: Internal Medicine

## 2011-07-24 ENCOUNTER — Ambulatory Visit (INDEPENDENT_AMBULATORY_CARE_PROVIDER_SITE_OTHER): Payer: 59 | Admitting: Vascular Surgery

## 2011-07-24 ENCOUNTER — Ambulatory Visit (INDEPENDENT_AMBULATORY_CARE_PROVIDER_SITE_OTHER): Payer: 59 | Admitting: Pharmacist

## 2011-07-24 ENCOUNTER — Encounter: Payer: Self-pay | Admitting: Vascular Surgery

## 2011-07-24 ENCOUNTER — Ambulatory Visit (INDEPENDENT_AMBULATORY_CARE_PROVIDER_SITE_OTHER): Payer: 59 | Admitting: Internal Medicine

## 2011-07-24 ENCOUNTER — Other Ambulatory Visit: Payer: Self-pay | Admitting: *Deleted

## 2011-07-24 VITALS — BP 149/89 | HR 57 | Resp 16 | Ht 74.0 in | Wt 219.8 lb

## 2011-07-24 VITALS — BP 121/78 | HR 59 | Temp 97.5°F | Ht 74.0 in | Wt 224.6 lb

## 2011-07-24 DIAGNOSIS — I83893 Varicose veins of bilateral lower extremities with other complications: Secondary | ICD-10-CM

## 2011-07-24 DIAGNOSIS — I82409 Acute embolism and thrombosis of unspecified deep veins of unspecified lower extremity: Secondary | ICD-10-CM

## 2011-07-24 DIAGNOSIS — E119 Type 2 diabetes mellitus without complications: Secondary | ICD-10-CM

## 2011-07-24 DIAGNOSIS — I824Z9 Acute embolism and thrombosis of unspecified deep veins of unspecified distal lower extremity: Secondary | ICD-10-CM

## 2011-07-24 DIAGNOSIS — Z48812 Encounter for surgical aftercare following surgery on the circulatory system: Secondary | ICD-10-CM

## 2011-07-24 DIAGNOSIS — R252 Cramp and spasm: Secondary | ICD-10-CM

## 2011-07-24 DIAGNOSIS — Z23 Encounter for immunization: Secondary | ICD-10-CM

## 2011-07-24 DIAGNOSIS — I803 Phlebitis and thrombophlebitis of lower extremities, unspecified: Secondary | ICD-10-CM

## 2011-07-24 DIAGNOSIS — I10 Essential (primary) hypertension: Secondary | ICD-10-CM

## 2011-07-24 DIAGNOSIS — Z7901 Long term (current) use of anticoagulants: Secondary | ICD-10-CM | POA: Insufficient documentation

## 2011-07-24 NOTE — Assessment & Plan Note (Signed)
Lab Results  Component Value Date   NA 137 06/12/2011   K 4.3 06/12/2011   CL 102 06/12/2011   CO2 28 06/12/2011   BUN 22 06/12/2011   CREATININE 1.08 06/12/2011   CREATININE 1.07 04/11/2010    BP Readings from Last 3 Encounters:  07/24/11 121/78  07/24/11 149/89  07/17/11 133/84    Assessment: Hypertension control:  controlled  Progress toward goals:  at goal Barriers to meeting goals:  no barriers identified  Plan: Hypertension treatment:  Continue current medications.

## 2011-07-24 NOTE — Patient Instructions (Signed)
Patient instructed to take medications as defined in the Anti-coagulation Track section of this encounter.  Patient instructed to take today's dose.  Patient verbalized understanding of these instructions.    

## 2011-07-24 NOTE — Assessment & Plan Note (Signed)
Acute DVT in gastrocnemius veins left leg-no involvement of popliteal or tibial veins   Plan:    3 months of anti-coagulation with Coumadin.  Keep INR between 2 and 2.5.  Continue long-leg elastic compression stocking and return to see Dr. Hart Rochester in 3 months for venous duplex exam  Hold ASA for now, while on coumadin. Patient has no known hx of CAD/MI.

## 2011-07-24 NOTE — Progress Notes (Signed)
Subjective:     Patient ID: Trevor Fischer, male   DOB: 1960-11-21, 50 y.o.   MRN: [id number]  HPI this 50 year old male returns 1 week post laser ablation of the left great saphenous vein with multiple stab phlebectomy appear he denies any swelling over the last week. He had some mild to moderate discomfort along the course of the great saphenous vein which has rapidly improved he has worn his long-leg elastic compression stocking. He has had no chest pain, shortness of breath, hemoptysis, dyspnea on exertion, or other pulmonary symptoms.  Review of Systems     Objective:   Physical Exam blood pressure 149 of brain and heart rate 57 respirations 16 Left leg has 3+ femoral dorsalis pedis pulse palpable Stab phlebectomy sites have healed nicely He has mild tenderness along the course of the great saphenous vein to the mid calf level There is no distal edema.  Today I ordered a venous duplex exam which I reviewed and interpreted. He has no DVT in the common femoral plan or main tibial veins. There is evidence of DVT in 2 sets of gastrocnemius veins in the popliteal fossa area and right saphenous vein is totally occluded with no clot at the saphenofemoral junction.    Assessment:    successful laser ablation left great saphenous vein with multiple stab phlebectomy Acute DVT in gastrocnemius veins left leg-no involvement of popliteal or tibial veins    Plan:     3 months of anti-coagulation with Coumadin. Will notify count outpatient clinic to follow this to keep INR between 2 and 2.5. Continue long-leg elastic compression stocking and return to see me in 3 months with venous duplex exam

## 2011-07-24 NOTE — Patient Instructions (Signed)
Please take Warfarin as directed, and you can hold the aspirin until you're off warfarin.  Please follow up for regular INR checks. Please take all other medications as prescribed.

## 2011-07-24 NOTE — Progress Notes (Signed)
  Subjective:    Patient ID: Trevor Fischer, male    DOB: 1961-07-23, 50 y.o.   MRN: [id number]  HPI  [name] is a 50 year old male with pmh significant for HIV (last 15 years, well-controlled), HLD, HTN, and DM. He saw Dr. Hart Fischer where he returned for 1 week post-op visit where he had laser ablation of the left great saphenous vein with multiple stab phlebectomy. He denies any swelling over the last week. He had some mild to moderate discomfort along the course of the great saphenous vein which has rapidly improved he has worn his long-leg elastic compression stocking. He has had no chest pain, shortness of breath, hemoptysis, dyspnea on exertion, or other pulmonary symptoms.  Started on Warfarin and instructed to take for 3 months.   Review of Systems  All other systems reviewed and are negative.       Objective:   Physical Exam  Constitutional: He is oriented to person, place, and time. He appears well-developed.  HENT:  Head: Normocephalic.  Eyes: Pupils are equal, round, and reactive to light.  Neck: Normal range of motion. Neck supple.  Cardiovascular: Normal rate and regular rhythm.   Pulmonary/Chest: Effort normal.  Abdominal: Soft. Bowel sounds are normal.  Musculoskeletal: Normal range of motion.  Neurological: He is alert and oriented to person, place, and time.  Psychiatric: He has a normal mood and affect. His behavior is normal.          Assessment & Plan:

## 2011-07-24 NOTE — Progress Notes (Signed)
Anti-Coagulation Progress Note  Trevor Fischer is a 50 y.o. male who is currently on an anti-coagulation regimen.    RECENT RESULTS: Recent results are below, the most recent result is correlated with a dose of having just commenced warfarin TODAY. Patient comes to [facility] having seen Dr. Jerilee Field with new diagnosis of thrombophlebitis of left lower leg isolated to gastrocnemius muscle area. Dr. Hart Rochester has stipulated an INR of 2.0 - 2.5 for THREE MONTHS DURATION. No bridging required due to it being a superficial thrombophlebitis episode caused by KNOWN PROVOKING CAUSE (ablation of veins).  Lab Results  Component Value Date   INR 2.0 07/13/2008   INR 2.3 06/16/2008   INR 2.3 05/11/2008    ANTI-COAG DOSE:   Latest dosing instructions   Total Sun Mon Tue Wed Thu Fri Sat   50 7.5 mg 5 mg 7.5 mg 7.5 mg 7.5 mg 7.5 mg 7.5 mg    (5 mg1.5) (5 mg1) (5 mg1.5) (5 mg1.5) (5 mg1.5) (5 mg1.5) (5 mg1.5)         ANTICOAG SUMMARY: Anticoagulation Episode Summary              Current INR goal 2.0-2.5 Next INR check 07/31/2011   INR from last check 2.0 (07/13/2008)     Weekly max dose (mg)  Target end date 10/24/2011   Indications Thrombophlebitis leg, Encounter for long-term (current) use of anticoagulants   INR check location [facility] Preferred lab    Send INR reminders to    Comments Per request of Dr. Babette Relic INR 2.0 - 2.5 for THREE MONTHS DURATION ONLY for this known provoking cause of thrombphlebitis (NOT DVT) per doppler exam. Accounting for NO LMWH bridge therapy.            ANTICOAG TODAY: Anticoagulation Summary as of 07/24/2011              INR goal 2.0-2.5     Selected INR 2.0 (07/13/2008) Next INR check 07/31/2011   Weekly max dose (mg)  Target end date 10/24/2011   Indications Thrombophlebitis leg, Encounter for long-term (current) use of anticoagulants    Anticoagulation Episode Summary              INR check location [facility] Preferred  lab    Send INR reminders to    Comments Per request of Dr. Babette Relic INR 2.0 - 2.5 for THREE MONTHS DURATION ONLY for this known provoking cause of thrombphlebitis (NOT DVT) per doppler exam. Accounting for NO LMWH bridge therapy.            PATIENT INSTRUCTIONS: Patient Instructions  Patient instructed to take medications as defined in the Anti-coagulation Track section of this encounter.  Patient instructed to take today's dose.  Patient verbalized understanding of these instructions.        FOLLOW-UP Return in 7 days (on 07/31/2011) for Follow up INR.  Hulen Luster, III Pharm.D., CACP

## 2011-07-31 ENCOUNTER — Ambulatory Visit (INDEPENDENT_AMBULATORY_CARE_PROVIDER_SITE_OTHER): Payer: 59 | Admitting: Pharmacist

## 2011-07-31 DIAGNOSIS — I803 Phlebitis and thrombophlebitis of lower extremities, unspecified: Secondary | ICD-10-CM

## 2011-07-31 DIAGNOSIS — Z7901 Long term (current) use of anticoagulants: Secondary | ICD-10-CM

## 2011-07-31 DIAGNOSIS — I809 Phlebitis and thrombophlebitis of unspecified site: Secondary | ICD-10-CM

## 2011-07-31 LAB — POCT INR: INR: 2.3

## 2011-07-31 NOTE — Patient Instructions (Signed)
Patient instructed to take medications as defined in the Anti-coagulation Track section of this encounter.  Patient instructed to take today's dose.  Patient verbalized understanding of these instructions.    

## 2011-07-31 NOTE — Procedures (Unsigned)
DUPLEX DEEP VENOUS EXAM - LOWER EXTREMITY  INDICATION:  Varicose veins, followup endovenous laser ablation.  HISTORY:  Edema:  Yes. Trauma/Surgery:  Endovenous laser ablation of the left great saphenous vein on 07/17/2011. Pain:  Yes. PE:  No. Previous DVT:  No. Anticoagulants:  No. Other:  DUPLEX EXAM:               CFV   SFV   PopV  PTV    GSV               R  L  R  L  R  L  R   L  R  L Thrombosis    o  o     o     o      o     + Spontaneous   +  +     +     +      +     o Phasic        +  +     +     +      +     o Augmentation  +  +     +     +      +     o Compressible  +  +     +     +      +     o Competent     +  +     o     o      +  Legend:  + - yes  o - no  p - partial  D - decreased  IMPRESSION: 1. Acute deep venous thrombosis involving 2 sets of gastrocnemius     veins in the left popliteal fossa/proximal calf segment. 2. Remainder of the left lower extremity deep venous system appears     patent. 3. The left great saphenous vein presents with good post ablation     results the length of the great saphenous vein ablated. 4. Superficial thrombus present involving the left lesser saphenous     vein from the distal thigh to the mid/distal calf segment. 5. Good compressibility and spontaneous phasic flow present involving     the right common femoral vein.   _____________________________ Trevor Fischer. Hart Rochester, M.D.  SH/MEDQ  D:  07/24/2011  T:  07/24/2011  Job:  [id number]

## 2011-07-31 NOTE — Progress Notes (Signed)
Anti-Coagulation Progress Note  Trevor Fischer is a 50 y.o. male who is currently on an anti-coagulation regimen.    RECENT RESULTS: Recent results are below, the most recent result is correlated with a dose of 50 mg. per week: Lab Results  Component Value Date   INR 2.3 07/31/2011   INR 2.0 07/13/2008   INR 2.3 06/16/2008    ANTI-COAG DOSE:   Latest dosing instructions   Total Sun Mon Tue Wed Thu Fri Sat   52.5 7.5 mg 7.5 mg 7.5 mg 7.5 mg 7.5 mg 7.5 mg 7.5 mg    (5 mg1.5) (5 mg1.5) (5 mg1.5) (5 mg1.5) (5 mg1.5) (5 mg1.5) (5 mg1.5)         ANTICOAG SUMMARY: Anticoagulation Episode Summary              Current INR goal 2.0-2.5 Next INR check 08/14/2011   INR from last check 2.3 (07/31/2011)     Weekly max dose (mg)  Target end date 10/24/2011   Indications Thrombophlebitis/phlebitis, superficial, Encounter for long-term (current) use of anticoagulants   INR check location [facility] Preferred lab    Send INR reminders to    Comments Sent from [facility] Dr. Jerilee Field on 22-OCT-12 with diagnosis of superficial thrombphlebitis confirmed in the vascular suite of [facility]. Dr. Hart Rochester stipulated 3 months duration of VKA because of the nature of the VTE--which is consistent with current guidelines published in CHEST. He also stipulated INR range of 2.0 - 2.5.            ANTICOAG TODAY: Anticoagulation Summary as of 07/31/2011              INR goal 2.0-2.5     Selected INR 2.3 (07/31/2011) Next INR check 08/14/2011   Weekly max dose (mg)  Target end date 10/24/2011   Indications Thrombophlebitis/phlebitis, superficial, Encounter for long-term (current) use of anticoagulants    Anticoagulation Episode Summary              INR check location [facility] Preferred lab    Send INR reminders to    Comments Sent from [facility] Dr. Jerilee Field on 22-OCT-12 with diagnosis of superficial thrombphlebitis confirmed in the vascular suite of [facility]. Dr. Hart Rochester  stipulated 3 months duration of VKA because of the nature of the VTE--which is consistent with current guidelines published in CHEST. He also stipulated INR range of 2.0 - 2.5.            PATIENT INSTRUCTIONS: Patient Instructions  Patient instructed to take medications as defined in the Anti-coagulation Track section of this encounter.  Patient instructed to take today's dose.  Patient verbalized understanding of these instructions.        FOLLOW-UP Return in 2 weeks (on 08/14/2011) for Follow up INR.  Hulen Luster, III Pharm.D., CACP

## 2011-08-11 ENCOUNTER — Encounter: Payer: Self-pay | Admitting: Internal Medicine

## 2011-08-11 ENCOUNTER — Ambulatory Visit (INDEPENDENT_AMBULATORY_CARE_PROVIDER_SITE_OTHER): Payer: 59 | Admitting: Internal Medicine

## 2011-08-11 ENCOUNTER — Other Ambulatory Visit: Payer: Self-pay | Admitting: Internal Medicine

## 2011-08-11 ENCOUNTER — Other Ambulatory Visit: Payer: Self-pay | Admitting: *Deleted

## 2011-08-11 VITALS — BP 112/70 | HR 61 | Temp 98.6°F | Ht 74.0 in | Wt 223.4 lb

## 2011-08-11 DIAGNOSIS — E119 Type 2 diabetes mellitus without complications: Secondary | ICD-10-CM

## 2011-08-11 DIAGNOSIS — I82409 Acute embolism and thrombosis of unspecified deep veins of unspecified lower extremity: Secondary | ICD-10-CM

## 2011-08-11 LAB — GLUCOSE, CAPILLARY: Glucose-Capillary: 94 mg/dL (ref 70–99)

## 2011-08-11 MED ORDER — WARFARIN SODIUM 5 MG PO TABS: 7.5000 mg | ORAL_TABLET | Freq: Every day | ORAL | Status: DC

## 2011-08-11 NOTE — Assessment & Plan Note (Signed)
Pt returns to clinic today to obtain refill of Coumadin.  He takes 7.mg everyday per Dr. Alexandria Lodge.  He has no complaints on exam today.  Site of laser ablation to left great saphenous vein with multiple stab phlebectomy is well held without tenderness on palpation.  Pt to return to clinic in 4 months after f/u with Dr. Hart Rochester.

## 2011-08-11 NOTE — Telephone Encounter (Signed)
Please check pt's pharmacy

## 2011-08-11 NOTE — Progress Notes (Signed)
  Subjective:    Patient ID: Trevor Fischer, male    DOB: 09-14-1961, 50 y.o.   MRN: [id number]  HPI Presents to clinic for refill of coumadin.  No complaints today.  Currently on 7.5 mg daily of Coumadin with management and INR checks per Dr. Alexandria Lodge.   Review of Systems As per HPI    Objective:   Physical Exam  Constitutional: He is oriented to person, place, and time. He appears well-developed and well-nourished. No distress.  HENT:  Head: Normocephalic and atraumatic.  Musculoskeletal: Normal range of motion. He exhibits no edema and no tenderness.       Legs: Neurological: He is alert and oriented to person, place, and time.  Skin: Skin is warm and dry.          Assessment & Plan:

## 2011-08-11 NOTE — Patient Instructions (Addendum)
It was nice to meet you today.  Please continue your coumadin as prescribed per Dr. Alexandria Lodge and return for INR checks as scheduled.  Refills of the coumadin have been sent to your pharmacy.  Return to see me in 4 months after your appointment with Dr. Hart Rochester.  As a reminder, please schedule an appointment with Dr. Daiva Eves as discussed.

## 2011-08-14 ENCOUNTER — Ambulatory Visit (INDEPENDENT_AMBULATORY_CARE_PROVIDER_SITE_OTHER): Payer: 59 | Admitting: Pharmacist

## 2011-08-14 DIAGNOSIS — Z7901 Long term (current) use of anticoagulants: Secondary | ICD-10-CM

## 2011-08-14 DIAGNOSIS — I809 Phlebitis and thrombophlebitis of unspecified site: Secondary | ICD-10-CM

## 2011-08-14 DIAGNOSIS — I803 Phlebitis and thrombophlebitis of lower extremities, unspecified: Secondary | ICD-10-CM

## 2011-08-14 LAB — POCT INR: INR: 2.7

## 2011-08-14 MED ORDER — WARFARIN SODIUM 5 MG PO TABS: 5.0000 mg | ORAL_TABLET | Freq: Every day | ORAL | Status: DC

## 2011-08-14 NOTE — Progress Notes (Signed)
Anti-Coagulation Progress Note  Trevor Fischer is a 50 y.o. male who is currently on an anti-coagulation regimen.    RECENT RESULTS: Recent results are below, the most recent result is correlated with a dose of 45 mg. per week: Lab Results  Component Value Date   INR 2.70 08/14/2011   INR 2.3 07/31/2011   INR 2.0 07/13/2008    ANTI-COAG DOSE:   Latest dosing instructions   Total Sun Mon Tue Wed Thu Fri Sat   47.5 7.5 mg 7.5 mg 5 mg 7.5 mg 7.5 mg 5 mg 7.5 mg    (5 mg1.5) (5 mg1.5) (5 mg1) (5 mg1.5) (5 mg1.5) (5 mg1) (5 mg1.5)         ANTICOAG SUMMARY: Anticoagulation Episode Summary              Current INR goal 2.0-2.5 Next INR check 08/28/2011   INR from last check 2.70! (08/14/2011)     Weekly max dose (mg)  Target end date 10/24/2011   Indications Thrombophlebitis/phlebitis, superficial, Encounter for long-term (current) use of anticoagulants   INR check location [facility] Preferred lab    Send INR reminders to    Comments Sent from [facility] Dr. Jerilee Field on 22-OCT-12 with diagnosis of superficial thrombphlebitis confirmed in the vascular suite of [facility]. Dr. Hart Rochester stipulated 3 months duration of VKA because of the nature of the VTE--which is consistent with current guidelines published in CHEST. He also stipulated INR range of 2.0 - 2.5.            ANTICOAG TODAY: Anticoagulation Summary as of 08/14/2011              INR goal 2.0-2.5     Selected INR 2.70! (08/14/2011) Next INR check 08/28/2011   Weekly max dose (mg)  Target end date 10/24/2011   Indications Thrombophlebitis/phlebitis, superficial, Encounter for long-term (current) use of anticoagulants    Anticoagulation Episode Summary              INR check location [facility] Preferred lab    Send INR reminders to    Comments Sent from [facility] Dr. Jerilee Field on 22-OCT-12 with diagnosis of superficial thrombphlebitis confirmed in the vascular suite of [facility]. Dr. Hart Rochester  stipulated 3 months duration of VKA because of the nature of the VTE--which is consistent with current guidelines published in CHEST. He also stipulated INR range of 2.0 - 2.5.            PATIENT INSTRUCTIONS: Patient Instructions  Patient instructed to take medications as defined in the Anti-coagulation Track section of this encounter.  Patient instructed to take today's dose.  Patient verbalized understanding of these instructions.        FOLLOW-UP Return in 2 weeks (on 08/28/2011) for Follow up INR.  Hulen Luster, III Pharm.D., CACP

## 2011-08-14 NOTE — Patient Instructions (Signed)
Patient instructed to take medications as defined in the Anti-coagulation Track section of this encounter.  Patient instructed to take today's dose.  Patient verbalized understanding of these instructions.    

## 2011-08-28 ENCOUNTER — Ambulatory Visit (INDEPENDENT_AMBULATORY_CARE_PROVIDER_SITE_OTHER): Payer: 59 | Admitting: Pharmacist

## 2011-08-28 DIAGNOSIS — I803 Phlebitis and thrombophlebitis of lower extremities, unspecified: Secondary | ICD-10-CM

## 2011-08-28 DIAGNOSIS — I809 Phlebitis and thrombophlebitis of unspecified site: Secondary | ICD-10-CM

## 2011-08-28 DIAGNOSIS — Z7901 Long term (current) use of anticoagulants: Secondary | ICD-10-CM

## 2011-08-28 LAB — POCT INR: INR: 2

## 2011-08-28 NOTE — Progress Notes (Signed)
Anti-Coagulation Progress Note  Trevor Fischer is a 50 y.o. male who is currently on an anti-coagulation regimen.    RECENT RESULTS: Recent results are below, the most recent result is correlated with a dose of 47.5 mg. per week: Lab Results  Component Value Date   INR 2.00 08/28/2011   INR 2.70 08/14/2011   INR 2.3 07/31/2011    ANTI-COAG DOSE:   Latest dosing instructions   Total Sun Mon Tue Wed Thu Fri Sat   52.5 7.5 mg 7.5 mg 7.5 mg 7.5 mg 7.5 mg 7.5 mg 7.5 mg    (5 mg1.5) (5 mg1.5) (5 mg1.5) (5 mg1.5) (5 mg1.5) (5 mg1.5) (5 mg1.5)         ANTICOAG SUMMARY: Anticoagulation Episode Summary              Current INR goal 2.0-2.5 Next INR check 09/18/2011   INR from last check 2.00 (08/28/2011)     Weekly max dose (mg)  Target end date 10/24/2011   Indications Thrombophlebitis/phlebitis, superficial, Encounter for long-term (current) use of anticoagulants   [facility] check location [facility] Preferred lab    Send INR reminders to    Comments Sent from [facility] Dr. Jerilee Field on 22-OCT-12 with diagnosis of superficial thrombphlebitis confirmed in the vascular suite of [facility]. Dr. Hart Rochester stipulated 3 months duration of VKA because of the nature of the VTE--which is consistent with current guidelines published in CHEST. He also stipulated INR range of 2.0 - 2.5.            ANTICOAG TODAY: Anticoagulation Summary as of 08/28/2011              INR goal 2.0-2.5     Selected INR 2.00 (08/28/2011) Next INR check 09/18/2011   Weekly max dose (mg)  Target end date 10/24/2011   Indications Thrombophlebitis/phlebitis, superficial, Encounter for long-term (current) use of anticoagulants    Anticoagulation Episode Summary              INR check location [facility] Preferred lab    Send INR reminders to    Comments Sent from [facility] Dr. Jerilee Field on 22-OCT-12 with diagnosis of superficial thrombphlebitis confirmed in the vascular suite of [facility]. Dr.  Hart Rochester stipulated 3 months duration of VKA because of the nature of the VTE--which is consistent with current guidelines published in CHEST. He also stipulated INR range of 2.0 - 2.5.            PATIENT INSTRUCTIONS: Patient Instructions  Patient instructed to take medications as defined in the Anti-coagulation Track section of this encounter.  Patient instructed to take today's dose.  Patient verbalized understanding of these instructions.  Patient instructed to take 1 and 1/2 x 5mg  (7.5mg  warfarin) by mouth daily until seen at next visit on 7-Jan-13.      FOLLOW-UP Return in 3 weeks (on 09/18/2011) for Follow up INR.  Hulen Luster, III Pharm.D., CACP

## 2011-08-28 NOTE — Patient Instructions (Signed)
Patient instructed to take medications as defined in the Anti-coagulation Track section of this encounter.  Patient instructed to take today's dose.  Patient verbalized understanding of these instructions.  Patient instructed to take 1 and 1/2 x 5mg  (7.5mg  warfarin) by mouth daily until seen at next visit on 7-Jan-13.

## 2011-09-11 ENCOUNTER — Encounter: Payer: Self-pay | Admitting: Vascular Surgery

## 2011-09-18 ENCOUNTER — Ambulatory Visit (INDEPENDENT_AMBULATORY_CARE_PROVIDER_SITE_OTHER): Payer: 59 | Admitting: Pharmacist

## 2011-09-18 DIAGNOSIS — I809 Phlebitis and thrombophlebitis of unspecified site: Secondary | ICD-10-CM

## 2011-09-18 DIAGNOSIS — I803 Phlebitis and thrombophlebitis of lower extremities, unspecified: Secondary | ICD-10-CM

## 2011-09-18 DIAGNOSIS — Z7901 Long term (current) use of anticoagulants: Secondary | ICD-10-CM

## 2011-09-18 NOTE — Patient Instructions (Signed)
Patient instructed to take medications as defined in the Anti-coagulation Track section of this encounter.  Patient instructed to take today's dose.  Patient verbalized understanding of these instructions.    

## 2011-09-18 NOTE — Progress Notes (Signed)
Anti-Coagulation Progress Note  Khamani Fairley is a 50 y.o. male who is currently on an anti-coagulation regimen.    RECENT RESULTS: Recent results are below, the most recent result is correlated with a dose of 52.5 mg. per week: Lab Results  Component Value Date   INR 2.40 09/18/2011   INR 2.00 08/28/2011   INR 2.70 08/14/2011    ANTI-COAG DOSE:   Latest dosing instructions   Total Sun Mon Tue Wed Thu Fri Sat   52.5 7.5 mg 7.5 mg 7.5 mg 7.5 mg 7.5 mg 7.5 mg 7.5 mg    (5 mg1.5) (5 mg1.5) (5 mg1.5) (5 mg1.5) (5 mg1.5) (5 mg1.5) (5 mg1.5)         ANTICOAG SUMMARY: Anticoagulation Episode Summary              Current INR goal 2.0-2.5 Next INR check 10/16/2011   INR from last check 2.40 (09/18/2011)     Weekly max dose (mg)  Target end date 10/24/2011   Indications Thrombophlebitis/phlebitis, superficial, Encounter for long-term (current) use of anticoagulants   INR check location [facility] Pref[facility] lab    Send INR reminders to    Comments Sent from [facility] Dr. Jerilee Field on 22-OCT-12 with diagnosis of superficial thrombphlebitis confirmed in the vascular suite of [facility]. Dr. Hart Rochester stipulated 3 months duration of VKA because of the nature of the VTE--which is consistent with current guidelines published in CHEST. He also stipulated INR range of 2.0 - 2.5.            ANTICOAG TODAY: Anticoagulation Summary as of 09/18/2011              INR goal 2.0-2.5     Selected INR 2.40 (09/18/2011) Next INR check 10/16/2011   Weekly max dose (mg)  Target end date 10/24/2011   Indications Thrombophlebitis/phlebitis, superficial, Encounter for long-term (current) use of anticoagulants    Anticoagulation Episode Summary              INR check location [facility] Pref[facility]    Send INR reminders to    Comments Sent from [facility] Dr. Jerilee Field on 22-OCT-12 with diagnosis of superficial thrombphlebitis confirmed in the vascular suite of [facility]. Dr.  Hart Rochester stipulated 3 months duration of VKA because of the nature of the VTE--which is consistent with current guidelines published in CHEST. He also stipulated INR range of 2.0 - 2.5.            PATIENT INSTRUCTIONS: Patient Instructions  Patient instructed to take medications as defined in the Anti-coagulation Track section of this encounter.  Patient instructed to take today's dose.  Patient verbalized understanding of these instructions.        FOLLOW-UP Return in 4 weeks (on 10/16/2011) for Follow up INR.  Hulen Luster, III Pharm.D., CACP

## 2011-09-25 ENCOUNTER — Encounter (HOSPITAL_COMMUNITY): Payer: Self-pay | Admitting: *Deleted

## 2011-09-25 ENCOUNTER — Emergency Department (HOSPITAL_COMMUNITY)
Admission: EM | Admit: 2011-09-25 | Discharge: 2011-09-25 | Disposition: A | Discharge status: Home / Self Care | Payer: 59 | Attending: Emergency Medicine | Admitting: Emergency Medicine

## 2011-09-25 DIAGNOSIS — Z794 Long term (current) use of insulin: Secondary | ICD-10-CM | POA: Insufficient documentation

## 2011-09-25 DIAGNOSIS — I10 Essential (primary) hypertension: Secondary | ICD-10-CM | POA: Insufficient documentation

## 2011-09-25 DIAGNOSIS — A4902 Methicillin resistant Staphylococcus aureus infection, unspecified site: Secondary | ICD-10-CM | POA: Insufficient documentation

## 2011-09-25 DIAGNOSIS — E119 Type 2 diabetes mellitus without complications: Secondary | ICD-10-CM | POA: Insufficient documentation

## 2011-09-25 DIAGNOSIS — L989 Disorder of the skin and subcutaneous tissue, unspecified: Secondary | ICD-10-CM | POA: Insufficient documentation

## 2011-09-25 MED ORDER — CLINDAMYCIN HCL 150 MG PO CAPS: 300.0000 mg | ORAL_CAPSULE | Freq: Four times a day (QID) | ORAL | Status: AC

## 2011-09-25 MED ORDER — DOXYCYCLINE HYCLATE 100 MG PO CAPS: 100.0000 mg | ORAL_CAPSULE | Freq: Two times a day (BID) | ORAL | Status: DC

## 2011-09-25 NOTE — ED Provider Notes (Signed)
History     CSN: [id number]  Arrival date & time 09/25/11  4098   First MD Initiated Contact with Patient 09/25/11 [phone]      Chief Complaint  Patient presents with  . Wound Infection    MRSA exposure    (Consider location/radiation/quality/duration/timing/severity/associated sxs/prior treatment) The history is provided by the patient.   patient presents with rash to his left lower extremity x3 days. History of MRSA in the past and this is similar to that. Denies any fever or severe pain to the extremity. No distal numbness or tingling. Has been on doxycycline for this before in the past. No medications taken prior to arrival  Past Medical History  Diagnosis Date  . 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 Hyperlipidemia   . Degenerative joint disease of knee   . HIV infection   . Hypertension   . GERD (gastroesophageal reflux disease)   . Hx MRSA infection   . Allergic rhinitis   . Superficial thrombophlebitis   . Diabetes mellitus 9/09  . DVT (deep venous thrombosis)     Past Surgical History  Procedure Date  . Vein ligation and stripping   . Endovenous ablation saphenous vein w/ laser 07-17-2011 LEFT GRERATER SAPHENOUS VEIN AND STAB PHLEBECTOMIES   10-20   LEFT LEG    Family History  Problem Relation Age of Onset  . Diabetes Father   . Kidney disease Father   . Heart failure Father   . Hyperlipidemia Father   . Hypertension Father   . Osteoarthritis Mother     History  Substance Use Topics  . Smoking status: Former Smoker    Types: Cigarettes    Quit date: 07/02/2005  . Smokeless tobacco: Not on file  . Alcohol Use: No      Review of Systems  All other systems reviewed and are negative.    Allergies  Augmentin and YNW:GNFAOZHYQMV+HQIONGEXB+MWUXLKGMWN acid+aspartame  Home Medications   Current Outpatient Rx  Name Route Sig Dispense Refill  . ATRIPLA 600-200-300 MG PO TABS  TAKE 1 TABLET BY MOUTH ONCE A DAY 90 tablet 3  . CYCLOBENZAPRINE  HCL 10 MG PO TABS Oral Take 10 mg by mouth 3 (three) times daily as needed.      Marland Kitchen DIPHENHYDRAMINE HCL (SLEEP) 25 MG PO TABS Oral Take 25 mg by mouth at bedtime as needed.      . INSULIN GLARGINE 100 UNIT/ML [place] SOLN Subcutaneous Inject 23 Units into the skin daily. Inject 23 units under the skin once daily 10 mL 3  . LISINOPRIL 20 MG PO TABS  TAKE 1 TABLET BY MOUTH ONCE A DAY 30 tablet 11  . NAPROXEN 500 MG PO TABS Oral Take 1 tablet (500 mg total) by mouth 2 (two) times daily with a meal. 30 tablet 1  . NOVOLOG FLEXPEN 100 UNIT/ML [place] SOLN  INJECT MEALTIME AND SNACKS-5 UNITS 15 MINUTES BEFORE MEALS AT 7AM, 12 NOON, 6 PM AND 12 MIDNIGHT 21 mL 4    Please supply the pt with enough cartridges for a  ...  . FISH OIL 1000 MG PO CAPS Oral Take by mouth daily.      Marland Kitchen ROSUVASTATIN CALCIUM 40 MG PO TABS Oral Take 1 tablet (40 mg total) by mouth daily. 30 tablet 11  . TRIAMTERENE-HCTZ 75-50 MG PO TABS  TAKE 1/2 TABLET BY MOUTH ONCE DAILY 45 tablet 4  . VERAPAMIL HCL ER 240 MG PO TBCR  TAKE 1 TABLET BY MOUTH ONCE A  DAY 90 tablet 4  . WARFARIN SODIUM 5 MG PO TABS Oral Take 1.5 tablets (7.5 mg total) by mouth daily. 45 tablet 4  . GLUCOSE BLOOD VI STRP  Use as instructed 100 each 3  . INSULIN PEN NEEDLE 31G X 8 MM MISC Does not apply by Does not apply route. Use to inject insulin 4 times daily     . LANCETS 28G MISC Does not apply by Does not apply route. Use to test blood glucose 4 to 6 x daily       BP 142/83  Pulse 82  Temp(Src) 98 F (36.7 C) (Oral)  Resp 20  SpO2 98%  Physical Exam  Nursing note and vitals reviewed. Constitutional: He is oriented to person, place, and time. Vital signs are normal. He appears well-developed and well-nourished.  Non-toxic appearance. No distress.  HENT:  Head: Normocephalic and atraumatic.  Eyes: Conjunctivae, EOM and lids are normal. Pupils are equal, round, and reactive to light.  Neck: Normal range of motion. Neck supple. No tracheal deviation present. No mass  present.  Cardiovascular: Normal rate, regular rhythm and normal heart sounds.  Exam reveals no gallop.   No murmur heard. Pulmonary/Chest: Effort normal and breath sounds normal. No stridor. No respiratory distress. He has no decreased breath sounds. He has no wheezes. He has no rhonchi. He has no rales.  Abdominal: Soft. Normal appearance and bowel sounds are normal. He exhibits no distension. There is no tenderness. There is no rebound and no CVA tenderness.  Musculoskeletal: Normal range of motion. He exhibits no edema and no tenderness.       Legs: Neurological: He is alert and oriented to person, place, and time. He has normal strength. No cranial nerve deficit or sensory deficit. GCS eye subscore is 4. GCS verbal subscore is 5. GCS motor subscore is 6.  Skin: Skin is warm and dry. No abrasion and no rash noted.  Psychiatric: He has a normal mood and affect. His speech is normal and behavior is normal.    ED Course  Procedures (including critical care time)  Labs Reviewed - No data to display No results found.   No diagnosis found.    MDM  Patient to be treated for MRSA with clindamycin and he will followup with his Dr. as needed        Toy Baker, MD 09/25/11 [phone]

## 2011-09-25 NOTE — ED Notes (Signed)
Starting Friday, the pt noticed a bite like bump on his left lower leg.  Pt now has 3  Bumps on his LLL.  One anteriorly, and two posteriorly.  Pt has a hx of MRSA.

## 2011-09-28 ENCOUNTER — Telehealth: Payer: Self-pay | Admitting: *Deleted

## 2011-10-12 ENCOUNTER — Ambulatory Visit (INDEPENDENT_AMBULATORY_CARE_PROVIDER_SITE_OTHER): Payer: 59 | Admitting: Internal Medicine

## 2011-10-12 ENCOUNTER — Other Ambulatory Visit (INDEPENDENT_AMBULATORY_CARE_PROVIDER_SITE_OTHER): Payer: 59

## 2011-10-12 ENCOUNTER — Encounter: Payer: Self-pay | Admitting: *Deleted

## 2011-10-12 ENCOUNTER — Telehealth: Payer: Self-pay | Admitting: *Deleted

## 2011-10-12 VITALS — BP 134/78 | HR 71 | Temp 97.3°F | Resp 20 | Wt 221.7 lb

## 2011-10-12 DIAGNOSIS — Z113 Encounter for screening for infections with a predominantly sexual mode of transmission: Secondary | ICD-10-CM

## 2011-10-12 DIAGNOSIS — Z79899 Other long term (current) drug therapy: Secondary | ICD-10-CM

## 2011-10-12 DIAGNOSIS — B20 Human immunodeficiency virus [HIV] disease: Secondary | ICD-10-CM

## 2011-10-12 DIAGNOSIS — L0390 Cellulitis, unspecified: Secondary | ICD-10-CM

## 2011-10-12 DIAGNOSIS — I82409 Acute embolism and thrombosis of unspecified deep veins of unspecified lower extremity: Secondary | ICD-10-CM

## 2011-10-12 MED ORDER — SULFAMETHOXAZOLE-TRIMETHOPRIM 800-160 MG PO TABS: 1.0000 | ORAL_TABLET | Freq: Two times a day (BID) | ORAL | Status: AC

## 2011-10-12 NOTE — Progress Notes (Unsigned)
Mr. Broz will be worked in.

## 2011-10-12 NOTE — Progress Notes (Unsigned)
Pt walked into clinic  stating he was seen in ED on 12/24 for MRSA infection on left lower leg.  He was put on antibiotics and resolved.  He has been off antibiotics for a week and  Redness and swelling has returned to same area. Pt had labs done in [facility] today and was told to come here to be seen .  ID can not see him until next week.  Please advise

## 2011-10-12 NOTE — Assessment & Plan Note (Addendum)
Patient was evaluated in 09/25/2011 for cellulitis and was prescribed doxycycline and clindamycin for 10 days she has completed. With that he has experienced some diarrhea which has resolved after completing the therapy. Patient was noted to have new lesions. I will prescribe a 10 day course of Bactrim since doxycycline is on back order. I had a discussion with the patient and his partner about precaution and prevention. Reviewed up to date there is no role of decolonization for MRSA especially in health care worker  recolonization is reported . I recommended the patient had hygiene, avoiding sharing personal items including towels wash closes and if using the gym walking down equipment.

## 2011-10-12 NOTE — Telephone Encounter (Signed)
States he was treated a few weeks ago at urgent care for mrsa. Finished the doxy but states it has come back. Showed me spots on his leg that were red . States they itch . We do not have any appts left until Tuesday next week. He is going to his pcp at [facility] to see if she will write another rx for him. I urged him to make an appt here as well. States he has an appt in 2 weeks with md here. I apologized that we could not see him today

## 2011-10-13 LAB — COMPLETE METABOLIC PANEL WITH GFR
Alkaline Phosphatase: 106 U/L (ref 39–117)
BUN: 24 mg/dL — ABNORMAL HIGH (ref 6–23)
CO2: 29 mEq/L (ref 19–32)
Creat: 1.09 mg/dL (ref 0.50–1.35)
GFR, Est African American: >89 mL/min
GFR, Est Non African American: 78 mL/min
Glucose, Bld: 108 mg/dL — ABNORMAL HIGH (ref 70–99)
Total Bilirubin: 0.3 mg/dL (ref 0.3–1.2)

## 2011-10-13 LAB — CBC WITH DIFFERENTIAL/PLATELET
Basophils Relative: 1 % (ref 0–1)
Eosinophils Absolute: 0.3 10*3/uL (ref 0.0–0.7)
Eosinophils Relative: 7 % — ABNORMAL HIGH (ref 0–5)
Hemoglobin: 14.2 g/dL (ref 13.0–17.0)
Lymphs Abs: 1.6 10*3/uL (ref 0.7–4.0)
MCH: 27.4 pg (ref 26.0–34.0)
MCHC: 34.2 g/dL (ref 30.0–36.0)
MCV: 80.1 fL (ref 78.0–100.0)
Monocytes Relative: 9 % (ref 3–12)
Platelets: 212 10*3/uL (ref 150–400)
RBC: 5.18 MIL/uL (ref 4.22–5.81)

## 2011-10-13 LAB — LIPID PANEL
Cholesterol: 165 mg/dL (ref 0–200)
VLDL: 16 mg/dL (ref 0–40)

## 2011-10-13 LAB — T-HELPER CELL (CD4) - (RCID CLINIC ONLY)
CD4 % Helper T Cell: 34 % (ref 33–55)
CD4 T Cell Abs: 530 uL (ref 400–2700)

## 2011-10-13 LAB — GC/CHLAMYDIA PROBE AMP, URINE: Chlamydia, Swab/Urine, PCR: NEGATIVE

## 2011-10-13 NOTE — Progress Notes (Signed)
Subjective:   Patient ID: Trevor Fischer male   DOB: 1961/04/18 51 y.o.   MRN: [id number]  HPI: Trevor Fischer is a 51 y.o. male with past medical history significant as outlined below who presented to the clinic with a lesion on his left shin. The patient reported that it started a couple of days ago. It is red and mildly tender and it looked like cellulitis which she had been evaluated in December of 2012. He reports that it started similar and spread out over his leg. In the emergency room he was prescribed doxycycline and clindamycin which he completed on 10/05/2010. Due to antibiotic therapy has experienced some diarrhea which completely resolves after cleaning antibiotic therapy. Patient denies any fevers or chills. Denies any trauma, insect bite, recent travel. He works at [employer]. Patient would like to know a this will be in usual state where he would have recurrent infection on his leg or if anything to prevent infection to occur.    Past Medical History  Diagnosis Date  . 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 Hyperlipidemia   . Degenerative joint disease of knee   . HIV infection   . Hypertension   . GERD (gastroesophageal reflux disease)   . Hx MRSA infection   . Allergic rhinitis   . Superficial thrombophlebitis   . Diabetes mellitus 9/09  . DVT (deep venous thrombosis)    Patient did not bring his medication with him Current Outpatient Prescriptions  Medication Sig Dispense Refill  . ATRIPLA 600-200-300 MG per tablet TAKE 1 TABLET BY MOUTH ONCE A DAY  90 tablet  3  . cyclobenzaprine (FLEXERIL) 10 MG tablet Take 10 mg by mouth 3 (three) times daily as needed.        . diphenhydrAMINE (SOMINEX) 25 MG tablet Take 25 mg by mouth at bedtime as needed.        Marland Kitchen glucose blood test strip Use as instructed  100 each  3  . insulin glargine (LANTUS) 100 UNIT/ML injection Inject 23 Units into the skin daily. Inject 23 units under the skin once daily  10 mL   3  . Insulin Pen Needle 31G X 8 MM MISC by Does not apply route. Use to inject insulin 4 times daily       . Lancets 28G MISC by Does not apply route. Use to test blood glucose 4 to 6 x daily       . lisinopril (PRINIVIL,ZESTRIL) 20 MG tablet TAKE 1 TABLET BY MOUTH ONCE A DAY  30 tablet  11  . naproxen (NAPROSYN) 500 MG tablet Take 1 tablet (500 mg total) by mouth 2 (two) times daily with a meal.  30 tablet  1  . NOVOLOG FLEXPEN 100 UNIT/ML injection INJECT MEALTIME AND SNACKS-5 UNITS 15 MINUTES BEFORE MEALS AT 7AM, 12 NOON, 6 PM AND 12 MIDNIGHT  21 mL  4  . Omega-3 Fatty Acids (FISH OIL) 1000 MG CAPS Take by mouth daily.        . rosuvastatin (CRESTOR) 40 MG tablet Take 1 tablet (40 mg total) by mouth daily.  30 tablet  11  . sulfamethoxazole-trimethoprim (BACTRIM DS,SEPTRA DS) 800-160 MG per tablet Take 1 tablet by mouth 2 (two) times daily.  20 tablet  0  . triamterene-hydrochlorothiazide (MAXZIDE) 75-50 MG per tablet TAKE 1/2 TABLET BY MOUTH ONCE DAILY  45 tablet  4  . verapamil (CALAN-SR) 240 MG CR tablet TAKE 1 TABLET BY MOUTH ONCE A DAY  90 tablet  4   Review of Systems: Constitutional: Denies fever, chills, diaphoresis, appetite change and fatigue.  Respiratory: Denies SOB, DOE, cough, chest tightness,  and wheezing.   Cardiovascular: Denies chest pain, palpitations and leg swelling.  Gastrointestinal: Denies nausea, vomiting, abdominal pain, diarrhea, constipation Skin: rash on his left leg.     Objective:  Physical Exam: Filed Vitals:   10/12/11 1508  BP: 134/78  Pulse: 71  Temp: 97.3 F (36.3 C)  TempSrc: Oral  Resp: 20  Weight: 221 lb 11.2 oz (100.562 kg)   Constitutional: Vital signs reviewed.  Patient is a well-developed and well-nourished in no acute distress and cooperative with exam. Alert and oriented x3.  Cardiovascular: RRR, S1 normal, S2 normal, no MRG, pulses symmetric and intact bilaterally Pulmonary/Chest: CTAB, no wheezes, rales, or rhonchi Abdominal:  Soft. Non-tender, non-distended, bowel sounds are normal, Neurological: A&O x3, sensory intact to light touch bilaterally.  Skin: Left shin: 2 inch size erythematous area, warm to touch, tender to palpation. Non-fluctuant. No other lesion noted.

## 2011-10-16 ENCOUNTER — Ambulatory Visit: Payer: 59 | Admitting: Pharmacist

## 2011-10-16 LAB — HIV-1 RNA QUANT-NO REFLEX-BLD
HIV 1 RNA Quant: <20 copies/mL (ref <20)
HIV-1 RNA Quant, Log: <1.3 {Log} (ref <1.30)

## 2011-10-16 NOTE — Progress Notes (Signed)
Patient has completed requisite treatment course as stipulated by [facility] Dr. Jerilee Field. Will discontinue warfarin.

## 2011-10-20 ENCOUNTER — Encounter: Payer: Self-pay | Admitting: Vascular Surgery

## 2011-10-23 ENCOUNTER — Other Ambulatory Visit (INDEPENDENT_AMBULATORY_CARE_PROVIDER_SITE_OTHER): Payer: 59 | Admitting: *Deleted

## 2011-10-23 ENCOUNTER — Ambulatory Visit (INDEPENDENT_AMBULATORY_CARE_PROVIDER_SITE_OTHER): Payer: 59 | Admitting: Vascular Surgery

## 2011-10-23 ENCOUNTER — Encounter: Payer: Self-pay | Admitting: Vascular Surgery

## 2011-10-23 VITALS — BP 130/69 | HR 83 | Resp 20 | Ht 74.0 in | Wt 219.0 lb

## 2011-10-23 DIAGNOSIS — I83893 Varicose veins of bilateral lower extremities with other complications: Secondary | ICD-10-CM

## 2011-10-23 DIAGNOSIS — I80299 Phlebitis and thrombophlebitis of other deep vessels of unspecified lower extremity: Secondary | ICD-10-CM

## 2011-10-23 DIAGNOSIS — I824Z9 Acute embolism and thrombosis of unspecified deep veins of unspecified distal lower extremity: Secondary | ICD-10-CM

## 2011-10-23 NOTE — Progress Notes (Signed)
Subjective:     Patient ID: Trevor Fischer, male   DOB: 1961-02-11, 51 y.o.   MRN: [id number]  HPI this 51 year old male returns for further followup regarding his venous insufficiency. He underwent laser ablation of the left great saphenous vein 3 months ago and his followup duplex scan revealed some thrombus in the gastrocnemius veins of the left leg but no DVT and complete closure of the left great saphenous vein. He had previously had treatments that interventional radiology on the right leg 10 years previously. Over the past 3 months he has had no significant swelling, pain, ulceration, or other specific complaints. He did develop some MRSA cultured from the skin and a few areas of his left leg which was treated a few weeks ago his Coumadin has been discontinued about 10 days ago. He is currently not on antibiotics and having no symptoms.   Past Medical History  Diagnosis Date  . 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 Hyperlipidemia   . Degenerative joint disease of knee   . HIV infection   . Hypertension   . GERD (gastroesophageal reflux disease)   . Hx MRSA infection   . Allergic rhinitis   . Superficial thrombophlebitis   . Diabetes mellitus 9/09  . DVT (deep venous thrombosis)     History  Substance Use Topics  . Smoking status: Former Smoker -- 20 years    Types: Cigarettes    Quit date: 07/02/2005  . Smokeless tobacco: Never Used  . Alcohol Use: No    Family History  Problem Relation Age of Onset  . Diabetes Father   . Kidney disease Father   . Heart failure Father   . Hyperlipidemia Father   . Hypertension Father   . Osteoarthritis Mother     Allergies  Allergen Reactions  . Augmentin   . WUJ:[id number]+AOZHYQMVH+QIONGEXBMW Acid+Aspartame     REACTION: rash    Current outpatient prescriptions:ATRIPLA 600-200-300 MG per tablet, TAKE 1 TABLET BY MOUTH ONCE A DAY, Disp: 90 tablet, Rfl: 3;  cyclobenzaprine (FLEXERIL) 10 MG tablet, Take 10 mg by mouth 3  (three) times daily as needed.  , Disp: , Rfl: ;  diphenhydrAMINE (SOMINEX) 25 MG tablet, Take 25 mg by mouth at bedtime as needed.  , Disp: , Rfl: ;  glucose blood test strip, Use as instructed, Disp: 100 each, Rfl: 3 insulin glargine (LANTUS) 100 UNIT/ML injection, Inject 23 Units into the skin daily. Inject 23 units under the skin once daily, Disp: 10 mL, Rfl: 3;  Insulin Pen Needle 31G X 8 MM MISC, by Does not apply route. Use to inject insulin 4 times daily , Disp: , Rfl: ;  Lancets 28G MISC, by Does not apply route. Use to test blood glucose 4 to 6 x daily , Disp: , Rfl:  lisinopril (PRINIVIL,ZESTRIL) 20 MG tablet, TAKE 1 TABLET BY MOUTH ONCE A DAY, Disp: 30 tablet, Rfl: 11;  naproxen (NAPROSYN) 500 MG tablet, Take 1 tablet (500 mg total) by mouth 2 (two) times daily with a meal., Disp: 30 tablet, Rfl: 1;  NOVOLOG FLEXPEN 100 UNIT/ML injection, INJECT MEALTIME AND SNACKS-5 UNITS 15 MINUTES BEFORE MEALS AT 7AM, 12 NOON, 6 PM AND 12 MIDNIGHT, Disp: 21 mL, Rfl: 4 Omega-3 Fatty Acids (FISH OIL) 1000 MG CAPS, Take by mouth daily.  , Disp: , Rfl: ;  rosuvastatin (CRESTOR) 40 MG tablet, Take 1 tablet (40 mg total) by mouth daily., Disp: 30 tablet, Rfl: 11;  triamterene-hydrochlorothiazide (MAXZIDE) 75-50 MG per tablet,  TAKE 1/2 TABLET BY MOUTH ONCE DAILY, Disp: 45 tablet, Rfl: 4;  verapamil (CALAN-SR) 240 MG CR tablet, TAKE 1 TABLET BY MOUTH ONCE A DAY, Disp: 90 tablet, Rfl: 4 sulfamethoxazole-trimethoprim (BACTRIM DS,SEPTRA DS) 800-160 MG per tablet, Take 1 tablet by mouth 2 (two) times daily., Disp: 20 tablet, Rfl: 0  BP 130/69  Pulse 83  Resp 20  Ht 6\' 2"  (1.88 m)  Wt 219 lb (99.338 kg)  BMI 28.12 kg/m2  Body mass index is 28.12 kg/(m^2).         Review of Systems denies chest pain, dyspnea on exertion, PND, orthopnea, masses, claudication, distal edema there     Objective:   Physical Exam blood pressure 130/69 heart rate 83 respirations 20 General well-developed well-nourished male no  apparent distress alert and oriented x3 HEENT normal for age Lungs rhonchi or wheezing Cardiovascular regular rhythm no murmurs carotid pulses 3+ audible bruits Left leg feels 3+ femoral popliteal dorsalis pedis pulse palpable. There are no bulging varicosities noted. There is no distal edema or skin lesions noted.   Today I ordered a venous duplex exam of the left leg which are viewed and interpret. There is no DVT. 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 Left great saphenous vein remained totally occluded.    Assessment:     Doing well post laser ablation left great saphenous vein with multiple stab phlebectomy is with episode of some gastrocnemius thrombus 3 months ago-has been on Coumadin therapy which has now been discontinued-patient remains asymptomatic    Plan:     Return to see [name] on a when necessary basis and continue daily aspirin

## 2011-10-24 ENCOUNTER — Encounter: Payer: Self-pay | Admitting: Vascular Surgery

## 2011-10-26 ENCOUNTER — Other Ambulatory Visit: Payer: Self-pay | Admitting: *Deleted

## 2011-10-26 ENCOUNTER — Encounter: Payer: Self-pay | Admitting: Infectious Disease

## 2011-10-26 ENCOUNTER — Ambulatory Visit (INDEPENDENT_AMBULATORY_CARE_PROVIDER_SITE_OTHER): Payer: 59 | Admitting: Infectious Disease

## 2011-10-26 VITALS — BP 130/85 | HR 84 | Temp 97.9°F | Ht 74.0 in | Wt 220.0 lb

## 2011-10-26 DIAGNOSIS — B20 Human immunodeficiency virus [HIV] disease: Secondary | ICD-10-CM

## 2011-10-26 DIAGNOSIS — I10 Essential (primary) hypertension: Secondary | ICD-10-CM

## 2011-10-26 DIAGNOSIS — E785 Hyperlipidemia, unspecified: Secondary | ICD-10-CM

## 2011-10-26 MED ORDER — ETRAVIRINE 200 MG PO TABS: 400.0000 mg | ORAL_TABLET | Freq: Every day | ORAL | Status: DC

## 2011-10-26 MED ORDER — EMTRICITABINE-TENOFOVIR DF 200-300 MG PO TABS: 1.0000 | ORAL_TABLET | Freq: Every day | ORAL | Status: DC

## 2011-10-26 MED ORDER — GLUCOSE BLOOD VI STRP: ORAL_STRIP | Status: DC

## 2011-10-26 NOTE — Assessment & Plan Note (Signed)
reasonble control

## 2011-10-26 NOTE — Progress Notes (Signed)
  Subjective:    Patient ID: Trevor Fischer, male    DOB: 1961-05-22, 51 y.o.   MRN: [id number]  HPI  Trevor Fischer is a 51 y.o. male who is doing superbly well on their his regimen, atripla, with undetectable viral load and health cd4 count. He has had problems with recent apparent MRSA infeciton with "spider bite" area on distal right leg sp rx with doxy, clinda then TMP/SMX in [facility]. He has finished therapy with coumadin for DVT. He is following with [facility] re his overall medical care and in particular his DM. WE reviewed his lipids on 40mg  of crestor, I proposed change to more lipid friendly  Regimen of etravrine 400mg  daily with truvada daily.    Review of Systems  Constitutional: Negative for fever, chills, diaphoresis, activity change, appetite change, fatigue and unexpected weight change.  HENT: Negative for congestion, sore throat, rhinorrhea, sneezing, trouble swallowing and sinus pressure.   Eyes: Negative for photophobia and visual disturbance.  Respiratory: Negative for cough, chest tightness, shortness of breath, wheezing and stridor.   Cardiovascular: Negative for chest pain, palpitations and leg swelling.  Gastrointestinal: Negative for nausea, vomiting, abdominal pain, diarrhea, constipation, blood in stool, abdominal distention and anal bleeding.  Genitourinary: Negative for dysuria, hematuria, flank pain and difficulty urinating.  Musculoskeletal: Negative for myalgias, back pain, joint swelling, arthralgias and gait problem.  Skin: Negative for color change, pallor, rash and wound.  Neurological: Negative for dizziness, tremors, weakness and light-headedness.  Hematological: Negative for adenopathy. Does not bruise/bleed easily.  Psychiatric/Behavioral: Negative for behavioral problems, confusion, sleep disturbance, dysphoric mood, decreased concentration and agitation.       Objective:   Physical Exam  Constitutional: He is oriented to person, place, and time. He  appears well-developed and well-nourished. No distress.  HENT:  Head: Normocephalic and atraumatic.  Mouth/Throat: Oropharynx is clear and moist. No oropharyngeal exudate.  Eyes: Conjunctivae and EOM are normal. Pupils are equal, round, and reactive to light. No scleral icterus.  Neck: Normal range of motion. Neck supple. No JVD present.  Cardiovascular: Normal rate, regular rhythm and normal heart sounds.  Exam reveals no gallop and no friction rub.   No murmur heard. Pulmonary/Chest: Effort normal and breath sounds normal. No respiratory distress. He has no wheezes. He has no rales. He exhibits no tenderness.  Abdominal: He exhibits no distension and no mass. There is no tenderness. There is no rebound and no guarding.  Musculoskeletal: He exhibits no edema and no tenderness.  Lymphadenopathy:    He has no cervical adenopathy.  Neurological: He is alert and oriented to person, place, and time. He has normal reflexes. He exhibits normal muscle tone. Coordination normal.  Skin: Skin is warm and dry. He is not diaphoretic. No erythema. No pallor.   .pro  Psychiatric: He has a normal mood and affect. His behavior is normal. Judgment and thought content normal.          Assessment & Plan:  HIV DISEASE Will change to 400mg  of etravrine daily with truvada  HYPERLIPIDEMIA NOT at goal. I am hpoing with change in ARV we can get him below 100 on his LDL  HYPERTENSION reasonble control

## 2011-10-26 NOTE — Assessment & Plan Note (Signed)
Will change to 400mg  of etravrine daily with truvada

## 2011-10-26 NOTE — Assessment & Plan Note (Signed)
NOT at goal. I am hpoing with change in ARV we can get him below 100 on his LDL

## 2011-10-30 NOTE — Procedures (Unsigned)
DUPLEX DEEP VENOUS EXAM - LOWER EXTREMITY  INDICATION:  Calf DVT  HISTORY:  Edema:  No Trauma/Surgery:  Left great saphenous vein laser ablation on 07/17/2011 Pain:  No PE:  No Previous DVT:  History of left gastrocnemius vein thrombus in 07/2011 Anticoagulants:  Yes Other:  DUPLEX EXAM:               CFV   SFV   PopV  PTV    GSV               R  L  R  L  R  L  R   L  R  L Thrombosis    o  o     o     o      o     + Spontaneous   +  +     +     +      +     o Phasic        +  +     +     +      +     o Augmentation  +  +     +     +      +     o Compressible  +  +     +     +      +     o Competent     o  o     +     +      +     o  Legend:  + - yes  o - no  p - partial  D - decreased  IMPRESSION: 1. No evidence of acute deep or superficial vein thrombosis noted in     the left lower extremity. 2. Reflux of >500 milliseconds noted in the bilateral common femoral     veins and focally in the left saphenofemoral junction. 3. The left proximal calf lesser saphenous and gastrocnemius veins     appear partially occluded with hyperechoic debris noted in the     nondilated vessels.  This appears consistent with history of     thrombosis.   _____________________________ Trevor Fischer [place], M.D.  CH/MEDQ  D:  10/23/2011  T:  10/23/2011  Job:  [id number]

## 2012-01-10 ENCOUNTER — Other Ambulatory Visit: Payer: Self-pay | Admitting: Infectious Diseases

## 2012-01-15 ENCOUNTER — Other Ambulatory Visit: Payer: Self-pay | Admitting: *Deleted

## 2012-01-15 DIAGNOSIS — I10 Essential (primary) hypertension: Secondary | ICD-10-CM

## 2012-01-16 ENCOUNTER — Other Ambulatory Visit: Payer: Self-pay | Admitting: Licensed Clinical Social Worker

## 2012-01-16 DIAGNOSIS — I10 Essential (primary) hypertension: Secondary | ICD-10-CM

## 2012-01-16 MED ORDER — LISINOPRIL 20 MG PO TABS: 20.0000 mg | ORAL_TABLET | Freq: Every day | ORAL | Status: DC

## 2012-01-23 ENCOUNTER — Other Ambulatory Visit: Payer: Self-pay | Admitting: *Deleted

## 2012-01-23 ENCOUNTER — Other Ambulatory Visit: Payer: Self-pay | Admitting: Infectious Disease

## 2012-01-23 DIAGNOSIS — E119 Type 2 diabetes mellitus without complications: Secondary | ICD-10-CM

## 2012-01-24 MED ORDER — INSULIN ASPART 100 UNIT/ML ~~LOC~~ SOLN: 5.0000 [IU] | Freq: Three times a day (TID) | SUBCUTANEOUS | Status: DC

## 2012-03-06 ENCOUNTER — Other Ambulatory Visit: Payer: Self-pay | Admitting: Internal Medicine

## 2012-04-18 ENCOUNTER — Other Ambulatory Visit: Payer: Self-pay | Admitting: Infectious Disease

## 2012-05-07 ENCOUNTER — Other Ambulatory Visit: Payer: Self-pay | Admitting: Licensed Clinical Social Worker

## 2012-05-07 DIAGNOSIS — I10 Essential (primary) hypertension: Secondary | ICD-10-CM

## 2012-05-07 MED ORDER — TRIAMTERENE-HCTZ 75-50 MG PO TABS: 1.0000 | ORAL_TABLET | Freq: Every day | ORAL | Status: DC

## 2012-05-08 ENCOUNTER — Ambulatory Visit (INDEPENDENT_AMBULATORY_CARE_PROVIDER_SITE_OTHER): Payer: 59 | Admitting: Sports Medicine

## 2012-05-08 ENCOUNTER — Encounter: Payer: Self-pay | Admitting: Sports Medicine

## 2012-05-08 VITALS — BP 136/61 | HR 63 | Resp 18 | Wt 224.0 lb

## 2012-05-08 DIAGNOSIS — M6530 Trigger finger, unspecified finger: Secondary | ICD-10-CM

## 2012-05-08 DIAGNOSIS — R252 Cramp and spasm: Secondary | ICD-10-CM

## 2012-05-08 DIAGNOSIS — M65311 Trigger thumb, right thumb: Secondary | ICD-10-CM | POA: Insufficient documentation

## 2012-05-08 NOTE — Assessment & Plan Note (Signed)
Today I injected the flexor pollicis longus tendon sheath under ultrasound guidance. He noted immediate decrease in pain, however triggering continued as expected. I will enroll him into hand physical therapy, and I like to see him back in 3-4 weeks to see how he is doing. Should his pain and triggering recur, I would attempt a single additional injection before sending him to hand surgery.

## 2012-05-08 NOTE — Progress Notes (Signed)
Patient ID: Trevor Fischer, male   DOB: [birth year], 51 y.o.   MRN: [id number] Subjective:   CC: Right-sided trigger thumb  HPI: Trevor Fischer is an extremely pleasant patient, he is a 51 year old male who is in pain he localizes on the volar aspect of his right thumb for a long time now. Even using some oral analgesics, and trying to work with him to the range of motion, however this has not worked. He desires an injection today. The pain does not radiate, it triggers on a daily basis, there is no associated numbness, or tingling. He denies any associated trauma.  Past medical history, Surgical history, Family history, Social history, Allergies, and medications have been entered into the medical record, reviewed, and no changes needed.  Review of Systems: No fevers, chills, night sweats, weight loss, chest pain, or shortness of breath.    Objective:  General:  Well Developed, well nourished, and in no acute distress. Neuro:  Alert and oriented x3, extra-ocular muscles intact. HEENT: Normocephalic, atraumatic, pupils equal round reactive to light, neck supple, no masses, no lymphadenopathy, thyroid nonpalpable. Skin: Warm and dry, no rashes noted. Cardiac: Regular rate and rhythm, no murmurs rubs or gallops. Respiratory:  Clear to auscultation bilaterally. Not using accessory muscles, speaking in full sentences. Abdominal: Soft, nontender, nondistended, positive bowel sounds, no masses, no organomegaly. Musculoskeletal: Shoulder, elbow, wrist, hip, knee, ankle stable, and with full range of motion. Right thumb shows tenderness to palpation along the flexor pollicis longus tendon at the metacarpophalangeal joint. I can palpate a nodule and I can palpate catching at the A1 pulley.  Real-time Ultrasound Guided Injection of: Right flexor pollicis longus tendon sheath. Ultrasound guided injection is preferred based studies that show increased duration, increased effect, greater accuracy, decreased  procedural pain, increased response rate, and decreased cost with ultrasound guided versus blind injection. Verbal informed consent obtained. Time-out conducted. Noted no overlying erythema, induration, or other signs of local infection. Skin prepped in a sterile fashion. Local anesthesia: Topical Ethyl chloride. With sterile technique and under real time ultrasound guidance: Needle events to the short axis, and seen entering the tendon sheath of the flexor pollicis longus. 1/2 cc of Kenalog 40, and 1 cc of lidocaine injected easily into the sheet, which was seen distending under real-time ultrasound guidance. Completed without difficulty Pain immediately resolved suggesting accurate placement of the medication. Advised to call if fevers/chills, erythema, induration, drainage, or persistent bleeding. Images permanently stored and available for review in the ultrasound unit.  Assessment & Plan:

## 2012-05-08 NOTE — Patient Instructions (Signed)
Trigger Finger Trigger finger (digital tendinitis and stenosing tenosynovitis) is a common disorder that causes an often painful catching of the fingers or thumb. It occurs as a clicking, snapping or locking of a finger in the palm of the hand. The reason for this is that there is a problem with the tendons which flex the fingers sliding smoothly through their sheaths. The cause of this may be inflammation of the tendon and sheath, or from a thickening or nodule in the tendon. The condition may occur in any finger or a couple fingers at the same time. The cause may be overuse while doing the same activity over and over again with your hands.  Tendons are the tough cords that connect the muscles to bones. Muscles and tendons are part of the system which allows your body to move. When muscles contract in the forearm on the palm side, they pull the tendons toward the elbow and cause the fingers and thumb to bend (flex) toward the palm. These are the flexor tendons. The tendons slide through a slippery smooth membrane (synovium) which is called the tendon sheath. The sheaths have areas of tough fibrous tissues surrounding them which hold the tendons close to the bone. These are called pulleys because they work like a pulley. The first pulley is in the palm of the hand near the crease which runs across your palm. If the area of the tendon thickening is near the pulley, the tendon cannot slide smoothly through the pulley and this causes the trigger finger. The finger may lock with the finger curled or suddenly straighten out with a snap. This is more common in patients with rheumatoid arthritis and diabetes. Left untreated, the condition may get worse to the point where the finger becomes locked in flexion, like making a fist, or less commonly locked with the finger straightened out. DIAGNOSIS  Your caregiver will easily make this diagnosis on examination. TREATMENT   Splinting for 6 to 8 weeks of time may be  helpful. Use the splints as your caregiver suggests.   Heat used for twenty minutes at least four times a day followed by ice packs for twenty minutes unless directed otherwise by your caregiver may be helpful. If you find either heat or cold seems to be making the problem worse, quit using them and ask your caregiver for directions.   Cortisone injections along with splinting may speed up recovery. Several injections may be required. Cortisone may give relief after one injection.   Only take over-the-counter or prescription medicines for pain, discomfort, or fever as directed by your caregiver.   Surgery is another treatment that may be used if conservative treatments using injection and splinting does not work. Surgery can be minor without incisions (a cut does not have to be made) and can be done with a needle through the skin. No stitches are needed and most patients may return to work the same day.   Other surgical choices involve an open procedure where the surgeon opens the hand through a small incision (cut) and cuts the pulley so the tendon can again slide smoothly. Your hand will still work fine. This small operation requires stitches and the recovery will be a little longer and the incisions will need to be protected until completely healed. You may have to limit your activities for up to 6 months.   Occupational or hand therapy may be required if there is stiffness remaining in the finger.  RISKS AND COMPLICATIONS Complications are uncommon but   some problems that may occur are:  Recurrence of the trigger finger. This does not mean that the surgery was not well done. It simply means that you may have formed scar tissue following surgery that causes the problem to reoccur.   Infection which could ruin the results of the surgery and can result in a finger which is frozen and can not move normally.   Nerve injury is possible which could result in permanent numbness of one or more fingers.   CARE AFTER SURGERY  Elevate your hand above your heart and use ice as instructed.   Follow instructions regarding finger motion/exercise.   Keep the surgical wound dry for at least 48 hrs or longer if instructed.   Keep your follow-up appointments.   Return to work and normal activities as instructed.  SEEK IMMEDIATE MEDICAL CARE IF:  Your problems are getting worse or you do not obtain relief from the treatment. Document Released: 07/08/2004 Document Revised: 09/07/2011 Document Reviewed: 03/02/2009 [employer] Patient Information 2012 [employer]. 

## 2012-05-13 NOTE — Addendum Note (Signed)
Addended by: Monica Becton on: 05/13/2012 02:41 PM   Modules accepted: Orders

## 2012-05-23 ENCOUNTER — Other Ambulatory Visit: Payer: Self-pay | Admitting: Adult Health

## 2012-05-23 ENCOUNTER — Telehealth: Payer: Self-pay | Admitting: *Deleted

## 2012-05-27 ENCOUNTER — Other Ambulatory Visit: Payer: Self-pay | Admitting: Internal Medicine

## 2012-05-27 ENCOUNTER — Other Ambulatory Visit: Payer: Self-pay | Admitting: Infectious Disease

## 2012-05-27 ENCOUNTER — Other Ambulatory Visit (INDEPENDENT_AMBULATORY_CARE_PROVIDER_SITE_OTHER): Payer: 59

## 2012-05-27 DIAGNOSIS — B20 Human immunodeficiency virus [HIV] disease: Secondary | ICD-10-CM

## 2012-05-27 DIAGNOSIS — Z113 Encounter for screening for infections with a predominantly sexual mode of transmission: Secondary | ICD-10-CM

## 2012-05-27 MED ORDER — ASPIRIN 81 MG PO TABS: 81.0000 mg | ORAL_TABLET | Freq: Every day | ORAL | Status: DC

## 2012-05-27 NOTE — Telephone Encounter (Signed)
Aspirin 81 mg reordered.

## 2012-05-27 NOTE — Telephone Encounter (Signed)
Received request to fill ASA 81 mg.  Not currently on pt's med list. Please advise.

## 2012-05-27 NOTE — Telephone Encounter (Signed)
Received request to fill r

## 2012-05-28 LAB — CBC WITH DIFFERENTIAL/PLATELET
Eosinophils Absolute: 0.3 10*3/uL (ref 0.0–0.7)
Eosinophils Relative: 5 % (ref 0–5)
Hemoglobin: 14.5 g/dL (ref 13.0–17.0)
Lymphocytes Relative: 38 % (ref 12–46)
Lymphs Abs: 2.1 10*3/uL (ref 0.7–4.0)
MCH: 26.9 pg (ref 26.0–34.0)
MCV: 78.8 fL (ref 78.0–100.0)
Monocytes Relative: 8 % (ref 3–12)
Platelets: 208 10*3/uL (ref 150–400)
RBC: 5.39 MIL/uL (ref 4.22–5.81)
WBC: 5.5 10*3/uL (ref 4.0–10.5)

## 2012-05-28 LAB — COMPLETE METABOLIC PANEL WITH GFR
ALT: 35 U/L (ref 0–53)
AST: 31 U/L (ref 0–37)
Alkaline Phosphatase: 82 U/L (ref 39–117)
BUN: 23 mg/dL (ref 6–23)
Calcium: 9.9 mg/dL (ref 8.4–10.5)
Creat: 1.22 mg/dL (ref 0.50–1.35)
Total Bilirubin: 0.5 mg/dL (ref 0.3–1.2)

## 2012-05-28 LAB — HIV-1 RNA QUANT-NO REFLEX-BLD
HIV 1 RNA Quant: <20 copies/mL (ref <20)
HIV-1 RNA Quant, Log: <1.3 {Log} (ref <1.30)

## 2012-05-28 LAB — LIPID PANEL
Cholesterol: 146 mg/dL (ref 0–200)
HDL: 39 mg/dL — ABNORMAL LOW (ref >39)
Total CHOL/HDL Ratio: 3.7 Ratio
VLDL: 12 mg/dL (ref 0–40)

## 2012-05-28 LAB — T-HELPER CELL (CD4) - (RCID CLINIC ONLY): CD4 % Helper T Cell: 30 % — ABNORMAL LOW (ref 33–55)

## 2012-05-28 LAB — RPR

## 2012-05-30 ENCOUNTER — Ambulatory Visit: Payer: 59 | Attending: Sports Medicine | Admitting: *Deleted

## 2012-05-30 DIAGNOSIS — IMO0001 Reserved for inherently not codable concepts without codable children: Secondary | ICD-10-CM | POA: Insufficient documentation

## 2012-05-30 DIAGNOSIS — M25549 Pain in joints of unspecified hand: Secondary | ICD-10-CM | POA: Insufficient documentation

## 2012-06-04 ENCOUNTER — Ambulatory Visit: Payer: 59 | Attending: Sports Medicine | Admitting: Occupational Therapy

## 2012-06-04 DIAGNOSIS — IMO0001 Reserved for inherently not codable concepts without codable children: Secondary | ICD-10-CM | POA: Insufficient documentation

## 2012-06-04 DIAGNOSIS — M25549 Pain in joints of unspecified hand: Secondary | ICD-10-CM | POA: Insufficient documentation

## 2012-06-05 ENCOUNTER — Ambulatory Visit (INDEPENDENT_AMBULATORY_CARE_PROVIDER_SITE_OTHER): Payer: 59 | Admitting: Sports Medicine

## 2012-06-05 ENCOUNTER — Encounter: Payer: Self-pay | Admitting: Sports Medicine

## 2012-06-05 VITALS — BP 130/63 | HR 71 | Temp 97.1°F | Resp 16 | Wt 222.0 lb

## 2012-06-05 DIAGNOSIS — M65311 Trigger thumb, right thumb: Secondary | ICD-10-CM

## 2012-06-05 DIAGNOSIS — M6530 Trigger finger, unspecified finger: Secondary | ICD-10-CM

## 2012-06-05 NOTE — Progress Notes (Signed)
Patient ID: Shawnmichael Fischer, male   DOB: 1961/02/19, 51 y.o.   MRN: [id number] Subjective:    CC: Followup trigger finger right thumb  HPI: Trevor Fischer is a pleasant 51 year old male who I saw approximately one month ago for right-sided trigger thumb of the flexor pollicis longus tendon. I did ultrasound guided injection into the tendon sheath, and send him to hand therapy. He returns completely pain-free, and with about 80% of his triggering episodes resolved. He does desire a single additional injection in the hopes of treating the remaining 20%. Overall he is very happy with the results. Hand therapy did give him a custom molded thumb splint which he does not like to use. He would rather use a short arm thumb spica brace.  Past medical history, Surgical history, Family history, Social history, Allergies, and medications have been entered into the medical record, reviewed, and no changes needed.   Review of Systems: No fevers, chills, night sweats, weight loss, chest pain, or shortness of breath.   Objective:    General: Well Developed, well nourished, and in no acute distress.  Neuro: Alert and oriented x3, extra-ocular muscles intact.  HEENT: Normocephalic, atraumatic, pupils equal round reactive to light, neck supple, no masses, no lymphadenopathy, thyroid nonpalpable.  Skin: Warm and dry, no rashes. Musculoskeletal: Shoulder, elbow, wrist, hip, knee, ankle stable, and with full range of motion. Right thumb shows tenderness to palpation along the flexor pollicis longus tendon at the metacarpophalangeal joint. I can palpate a nodule and I can palpate catching at the A1 pulley.  Real-time Ultrasound Guided Injection of: Right flexor pollicis longus tendon sheath. Ultrasound guided injection is preferred based studies that show increased duration, increased effect, greater accuracy, decreased procedural pain, increased response rate, and decreased cost with ultrasound guided versus blind  injection. Verbal informed consent obtained. Time-out conducted. Noted no overlying erythema, induration, or other signs of local infection. Skin prepped in a sterile fashion. Local anesthesia: Topical Ethyl chloride. With sterile technique and under real time ultrasound guidance: Needle events to the short axis, and seen entering the tendon sheath of the flexor pollicis longus. 1/2 cc of Kenalog 40, and 1 cc of lidocaine injected easily into the sheet, which was seen distending under real-time ultrasound guidance. Completed without difficulty Pain immediately resolved suggesting accurate placement of the medication. Advised to call if fevers/chills, erythema, induration, drainage, or persistent bleeding. Images permanently stored and available for review in the ultrasound unit.  Impression and Recommendations:

## 2012-06-05 NOTE — Assessment & Plan Note (Signed)
I performed a single additional flexor pollicis longus tendon sheath injection under ultrasound guidance. He'll continue doing home rehabilitation, and wearing his thumb spica brace. We'll see him back in approximately 6 weeks.

## 2012-06-06 ENCOUNTER — Encounter: Payer: 59 | Admitting: Occupational Therapy

## 2012-06-10 ENCOUNTER — Encounter: Payer: Self-pay | Admitting: Infectious Disease

## 2012-06-10 ENCOUNTER — Ambulatory Visit (INDEPENDENT_AMBULATORY_CARE_PROVIDER_SITE_OTHER): Payer: 59 | Admitting: Infectious Disease

## 2012-06-10 VITALS — BP 127/77 | HR 61 | Temp 98.1°F | Ht 75.0 in | Wt 228.0 lb

## 2012-06-10 DIAGNOSIS — B20 Human immunodeficiency virus [HIV] disease: Secondary | ICD-10-CM

## 2012-06-10 DIAGNOSIS — E785 Hyperlipidemia, unspecified: Secondary | ICD-10-CM

## 2012-06-10 DIAGNOSIS — E881 Lipodystrophy, not elsewhere classified: Secondary | ICD-10-CM

## 2012-06-10 DIAGNOSIS — Z23 Encounter for immunization: Secondary | ICD-10-CM

## 2012-06-10 DIAGNOSIS — N289 Disorder of kidney and ureter, unspecified: Secondary | ICD-10-CM

## 2012-06-10 DIAGNOSIS — E119 Type 2 diabetes mellitus without complications: Secondary | ICD-10-CM

## 2012-06-10 NOTE — Assessment & Plan Note (Signed)
Followed in  Internal medicine clinic

## 2012-06-10 NOTE — Assessment & Plan Note (Signed)
Perfect control continue current regimen.

## 2012-06-10 NOTE — Assessment & Plan Note (Signed)
At goal.  

## 2012-06-10 NOTE — Assessment & Plan Note (Signed)
Serum creatinine is slightly elevated we'll check urine sodium creatinine as well as urine microalbumin creatinine ratio.

## 2012-06-10 NOTE — Assessment & Plan Note (Signed)
All check a serum testosterone level. I am willing to prescribe human growth hormone to him. There is some risk of exacerbation of diabetes and concern for risk of retinopathy. 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

## 2012-06-10 NOTE — Progress Notes (Signed)
Subjective:    Patient ID: Trevor Fischer, male    DOB: Jan 13, 1961, 51 y.o.   MRN: [id number]  HPI  Trevor Fischer is a 51 y.o. male who is doing superbly well on his  antiviral regimen, intelence and truvada  with undetectable viral load and health cd4 count. Both he and his partner are here today are concerned about their gain of body fat in her abdomen and are asking could they be treated with EGRIFTA. I reviewed Will's labs and as his creatinine had jumped up slightly to 1.2 to do some lab work for that. Otherwise he is doing relatively well. He was recently seen by sports medicine for a trigger finger. I spent greater than 45 minutes with the patient including greater than 50% of time in face to face counsel of the patient and in coordination of their care.    Review of Systems  Constitutional: Negative for fever, chills, diaphoresis, activity change, appetite change, fatigue and unexpected weight change.  HENT: Negative for congestion, sore throat, rhinorrhea, sneezing, trouble swallowing and sinus pressure.   Eyes: Negative for photophobia and visual disturbance.  Respiratory: Negative for cough, chest tightness, shortness of breath, wheezing and stridor.   Cardiovascular: Negative for chest pain, palpitations and leg swelling.  Gastrointestinal: Negative for nausea, vomiting, abdominal pain, diarrhea, constipation, blood in stool, abdominal distention and anal bleeding.  Genitourinary: Negative for dysuria, hematuria, flank pain and difficulty urinating.  Musculoskeletal: Negative for myalgias, back pain, joint swelling, arthralgias and gait problem.  Skin: Negative for color change, pallor, rash and wound.  Neurological: Negative for dizziness, tremors, weakness and light-headedness.  Hematological: Negative for adenopathy. Does not bruise/bleed easily.  Psychiatric/Behavioral: Negative for behavioral problems, confusion, disturbed wake/sleep cycle, dysphoric mood,  decreased concentration and agitation.       Objective:   Physical Exam  Constitutional: He is oriented to person, place, and time. He appears well-developed and well-nourished. No distress.  HENT:  Head: Normocephalic and atraumatic.  Mouth/Throat: Oropharynx is clear and moist. No oropharyngeal exudate.  Eyes: Conjunctivae and EOM are normal. Pupils are equal, round, and reactive to light. No scleral icterus.  Neck: Normal range of motion. Neck supple. No JVD present.  Cardiovascular: Normal rate, regular rhythm and normal heart sounds.  Exam reveals no gallop and no friction rub.   No murmur heard. Pulmonary/Chest: Effort normal and breath sounds normal. No respiratory distress. He has no wheezes. He has no rales. He exhibits no tenderness.  Abdominal: He exhibits no distension and no mass. There is no tenderness. There is no rebound and no guarding.  Musculoskeletal: He exhibits no edema and no tenderness.  Lymphadenopathy:    He has no cervical adenopathy.  Neurological: He is alert and oriented to person, place, and time. He has normal reflexes. He exhibits normal muscle tone. Coordination normal.  Skin: Skin is warm and dry. He is not diaphoretic. No erythema. No pallor.  Psychiatric: He has a normal mood and affect. His behavior is normal. Judgment and thought content normal.          Assessment & Plan:  HIV DISEASE Perfect control continue current regimen.  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 I am willing to prescribe human growth hormone to him. There is some risk of exacerbation of diabetes and concern for risk of retinopathy. 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 He is failed attempts to lose that weight through diet and exercise and he does have body fat changes highly  consistent with HIV lipodystrophy syndrome.  Renal insufficiency Serum creatinine is slightly elevated we'll check urine sodium creatinine as well as urine microalbumin creatinine ratio.  DM Followed in  Internal medicine clinic  HYPERLIPIDEMIA At goal

## 2012-06-11 LAB — URINALYSIS, ROUTINE W REFLEX MICROSCOPIC
Hgb urine dipstick: NEGATIVE
Leukocytes, UA: NEGATIVE
Nitrite: NEGATIVE
Protein, ur: NEGATIVE mg/dL
Urobilinogen, UA: 0.2 mg/dL (ref 0.0–1.0)
pH: 6.5 (ref 5.0–8.0)

## 2012-06-11 LAB — BASIC METABOLIC PANEL WITH GFR
Chloride: 103 mEq/L (ref 96–112)
Creat: 1.13 mg/dL (ref 0.50–1.35)
GFR, Est Non African American: 74 mL/min
Potassium: 4.3 mEq/L (ref 3.5–5.3)
Sodium: 138 mEq/L (ref 135–145)

## 2012-06-11 LAB — TESTOSTERONE: Testosterone: 353.48 ng/dL (ref 300–890)

## 2012-06-12 ENCOUNTER — Encounter: Payer: 59 | Admitting: Occupational Therapy

## 2012-06-14 ENCOUNTER — Encounter: Payer: 59 | Admitting: *Deleted

## 2012-06-19 ENCOUNTER — Encounter: Payer: 59 | Admitting: *Deleted

## 2012-06-21 ENCOUNTER — Encounter: Payer: 59 | Admitting: *Deleted

## 2012-06-25 ENCOUNTER — Encounter: Payer: 59 | Admitting: Occupational Therapy

## 2012-06-26 ENCOUNTER — Other Ambulatory Visit: Payer: Self-pay | Admitting: Infectious Disease

## 2012-06-27 ENCOUNTER — Encounter: Payer: 59 | Admitting: Occupational Therapy

## 2012-06-28 ENCOUNTER — Telehealth: Payer: Self-pay | Admitting: Infectious Disease

## 2012-06-28 MED ORDER — TESAMORELIN ACETATE 2 MG ~~LOC~~ SOLR: 2.0000 mg | Freq: Every day | SUBCUTANEOUS | Status: DC

## 2012-06-28 NOTE — Telephone Encounter (Signed)
Pt can have egrifta. Needs to watch blood sugars

## 2012-07-03 ENCOUNTER — Ambulatory Visit (INDEPENDENT_AMBULATORY_CARE_PROVIDER_SITE_OTHER): Payer: 59 | Admitting: Sports Medicine

## 2012-07-03 ENCOUNTER — Encounter: Payer: Self-pay | Admitting: Sports Medicine

## 2012-07-03 VITALS — BP 133/64 | HR 65 | Wt 224.0 lb

## 2012-07-03 DIAGNOSIS — M65311 Trigger thumb, right thumb: Secondary | ICD-10-CM

## 2012-07-03 DIAGNOSIS — M6530 Trigger finger, unspecified finger: Secondary | ICD-10-CM

## 2012-07-03 NOTE — Progress Notes (Signed)
Subjective:    CC: Followup right trigger thumb  HPI: Pritesh returns to see me after 2 ultrasound-guided flexor pollicis longus tendon sheath injection. The first got him approximately 80% better, and the second to him the rest of the 20%. His thumb has no pain, and no longer triggers.  Past medical history, Surgical history, Family history, Social history, Allergies, and medications have been entered into the medical record, reviewed, and no changes needed.   Review of Systems: No fevers, chills, night sweats, weight loss, chest pain, or shortness of breath.   Objective:    General: Well Developed, well nourished, and in no acute distress.  Hand is unremarkable to inspection, nodule is no longer palpable, range of motion is full at the first interphalangeal and metacarpophalangeal joints.   Impression and Recommendations:

## 2012-07-03 NOTE — Assessment & Plan Note (Signed)
Pain and mechanical symptoms completely resolved status post 2 ultrasound-guided flexor pollicis longus tendon sheath injections. He may come back to see me on an as-needed basis for this.

## 2012-07-05 ENCOUNTER — Telehealth: Payer: Self-pay | Admitting: Dietician

## 2012-07-09 NOTE — Telephone Encounter (Signed)
Patient scheduled with CDE for 08-19-12 for his annual review.

## 2012-07-15 ENCOUNTER — Telehealth: Payer: Self-pay | Admitting: Infectious Disease

## 2012-07-15 NOTE — Telephone Encounter (Signed)
Received message to call Marcy Siren about Egrifta.  Gave what information I had.  Printed application for medical necessity.  Got what information I could from Mr. Ladona Ridgel and put on application.  Will discuss with Angelique Blonder when she returns to work.

## 2012-07-24 ENCOUNTER — Telehealth: Payer: Self-pay | Admitting: *Deleted

## 2012-07-24 ENCOUNTER — Encounter: Payer: Self-pay | Admitting: *Deleted

## 2012-07-24 NOTE — Telephone Encounter (Signed)
Pt called to ask where in the process was his application for Egrifta.  Rinaldo Cloud is working on the application.

## 2012-07-24 NOTE — Telephone Encounter (Signed)
Error

## 2012-08-19 ENCOUNTER — Ambulatory Visit (INDEPENDENT_AMBULATORY_CARE_PROVIDER_SITE_OTHER): Payer: 59 | Admitting: Dietician

## 2012-08-19 ENCOUNTER — Ambulatory Visit (INDEPENDENT_AMBULATORY_CARE_PROVIDER_SITE_OTHER): Payer: 59 | Admitting: Internal Medicine

## 2012-08-19 ENCOUNTER — Encounter: Payer: Self-pay | Admitting: Internal Medicine

## 2012-08-19 VITALS — BP 124/76 | HR 76 | Temp 97.7°F | Ht 74.0 in | Wt 226.0 lb

## 2012-08-19 DIAGNOSIS — Z79899 Other long term (current) drug therapy: Secondary | ICD-10-CM

## 2012-08-19 DIAGNOSIS — B20 Human immunodeficiency virus [HIV] disease: Secondary | ICD-10-CM

## 2012-08-19 DIAGNOSIS — R609 Edema, unspecified: Secondary | ICD-10-CM

## 2012-08-19 DIAGNOSIS — E119 Type 2 diabetes mellitus without complications: Secondary | ICD-10-CM

## 2012-08-19 DIAGNOSIS — E881 Lipodystrophy, not elsewhere classified: Secondary | ICD-10-CM

## 2012-08-19 DIAGNOSIS — I10 Essential (primary) hypertension: Secondary | ICD-10-CM

## 2012-08-19 DIAGNOSIS — R5381 Other malaise: Secondary | ICD-10-CM

## 2012-08-19 DIAGNOSIS — E785 Hyperlipidemia, unspecified: Secondary | ICD-10-CM

## 2012-08-19 NOTE — Progress Notes (Signed)
  Subjective:    Patient ID: Trevor Fischer, male    DOB: Jan 26, 1961, 51 y.o.   MRN: [id number]  HPI  Pt with hx significant for well controlled HIV, lipodystrophy due to HIV, hypertension, Diabetes Mellitus and hyperlipidemia who presents for annual check up.  He is followed closely by Dr. Daiva Eves and states that he is awaiting approval for [facility] which is a new drug for excess fat in the abdomen in HIV patients. Complaints of chronic bilateral upper extremity weakness. No other complaints.   Review of Systems  Constitutional: Negative for appetite change and fatigue.  Respiratory: Negative for shortness of breath.   Cardiovascular: Negative for chest pain.  Genitourinary: Negative for dysuria.  Neurological:       Bilateral upper extremity weakness.       Objective:   Physical Exam  Constitutional: He is oriented to person, place, and time. He appears well-developed and well-nourished. No distress.  HENT:  Head: Normocephalic and atraumatic.  Eyes: Pupils are equal, round, and reactive to light.       Injected sclera bilaterally  Neck: Normal range of motion. Neck supple.  Cardiovascular: Normal rate, regular rhythm, normal heart sounds and intact distal pulses.   Pulmonary/Chest: Effort normal and breath sounds normal.  Abdominal: Soft. Bowel sounds are normal. He exhibits distension. He exhibits no mass. There is no tenderness. There is no rebound.  Musculoskeletal: He exhibits edema.       Bilateral LE edema, nontender  Neurological: He is alert and oriented to person, place, and time. He has normal strength and normal reflexes. No cranial nerve deficit or sensory deficit.  Skin: Skin is warm and dry.  Psychiatric: He has a normal mood and affect. His behavior is normal. Judgment and thought content normal.          Assessment & Plan:  1. HIV: well controlled, cont current therapy of Etravirine and Truvada per ID 2. Htn: well controlled on lisinopril 20 mg qd,  triamterene-hctz 75-50mg  qd and verapamil 240 mg qd 3. Hyperlipidemia: on Crestor and Fish Oil 4. 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 LE edema: advised to resume compression hose, pt works on his feet for many hours at night 6. HIV lipodystrophy: awaiting EGRIFTA approval, encouraged healthy diet and exercise to strengthen abdominal muscles

## 2012-08-19 NOTE — Patient Instructions (Signed)
Hopefully the EGRIFTA will be approved.  Until, then continue to eat healthy and exercise. It will help your leg edema to resume wearing the compression stockings (TED hose). We will not make any changes to your medication regimen today. Follow-up with me in 6 months.

## 2012-08-19 NOTE — Patient Instructions (Addendum)
Goal: better blood sugars and stop weight gain/ maybe get some weight loss  Plan:  1-  Limit bedtime snack to about 200 calories - write down what you eat after dinner for two nights and compare to this.  You might want to make a list for yourself of comonly eaten foods after dinner  2- You can consider checking blood sugar a few times 2 hours after dinner to know how well the amount of insulin matched your food.   3- Can try splitting Lantus in to twice a day - 12 units and 11 units  To get insuracne quote about pump : Medtronic Minimed Insulin Pump- Give them a call to get it started  [phone]  Please make a follow up appointment for 4 weeks and bring list. .  Remember SICK DAY RULES if needed.

## 2012-08-20 NOTE — Progress Notes (Signed)
Diabetes Self-Management Training (DSMT)  Follow-Up 4 Visit- last seen in 11/2009 and 04/2010  08/20/2012 Trevor Fischer, identified by name and date of birth, is a 51 y.o. male with patient unsure- C peptide was 1.41 at dx in 2009 which is low normal limits. Year of diabetes diagnosis: 2009 Other persons present: spouse/SO  ASSESSMENT Patient concerns are Glycemic control. And weight increasing  There were no vitals taken for this visit. There is no height or weight on file to calculate BMI. Lab Results  Component Value Date   LDLCALC 95 05/27/2012   Lab Results  Component Value Date   HGBA1C 7.6 08/19/2012   Family history of diabetes: No Support systems: spouse Special needs: None Prior DM Education: Yes Patients belief/attitude about diabetes: Diabetes can be controlled. Self foot exams daily: Yes Diabetes Complications: None    Medications See Medications list.  Has adequate knowledge and Is interested in learning more   Exercise Plan Doing walking for 60 minutesa day.   Self-Monitoring  Monitor: accu check aviva Frequency of testing: 4 times/day Breakfast: 150-190 Lunch: 100-150 Supper: 100-50 Bedtime: 100-150  Hyperglycemia: Yes Weekly Hypoglycemia: No   Meal Planning Limited knowledge and Interested in improving   Assessment comments: patient concerned about weight increasing in abdomen and higher than usual blood sugars. He reports that HIV meds have been changed. We discussed options today to improve for both blood sugars and weight. Attempting to obtain anti-islet cell antibody & GAD labs from centricity chart.    INDIVIDUAL DIABETES EDUCATION PLAN:  Nutrition management Medication Monitoring Goal setting _______________________________________________________________  Intervention TOPICS COVERED TODAY:  Nutrition management  Role of diet in the treatment of diabetes and the relationship between the three main macronutritents and blood  glucose control. Reviewed blood glucose goals for pre and post meals and how to evaluate the patients' food intake on their blood glucose level. Medication  Reviewed patients medication for diabetes, action, purpose, timing of dose and side effects.  PATIENTS GOALS/PLAN (copy and paste in patient instructions so patient receives a copy): 1.  Learning Objective:       State  2.  Behavioral Objective:         Nutrition: To improve blood glucose control I will limit bedtime snack to 200 calories Never 0%  Personalized Follow-Up Plan for Ongoing Self Management Support:  Doctor's Office, family, CDE visits and [place] pharmacy visits and medlink ______________________________________________________________________   Outcomes Expected outcomes: Demonstrated interest in learning.Expect positive changes in lifestyle. Self-care Barriers: None Education material provided: yes Patient to contact team via Phone if problems or questions. Time in: 1430     Time out: 1530  Future DSMT - 4 wks   Jenelle Drennon, [name]

## 2012-08-21 ENCOUNTER — Encounter: Payer: Self-pay | Admitting: Dietician

## 2012-09-06 ENCOUNTER — Ambulatory Visit (INDEPENDENT_AMBULATORY_CARE_PROVIDER_SITE_OTHER): Payer: Self-pay | Admitting: Family Medicine

## 2012-09-06 DIAGNOSIS — E119 Type 2 diabetes mellitus without complications: Secondary | ICD-10-CM

## 2012-09-06 NOTE — Progress Notes (Signed)
Patient presents today for 3 month DM follow-up as part of the employer-sponsored Link to Wellness program. Medications, insulin regimen, and glucose readings have been reviewed. I have also discussed with patient lifestyle interventions such as diet and physical activity. Details of this visit can be found in [facility] documenting program through [facility] Patient has set a series of personal goals and will follow-up in 3 months for further review of DM.  

## 2012-09-13 ENCOUNTER — Encounter: Payer: Self-pay | Admitting: Family Medicine

## 2012-09-13 NOTE — Progress Notes (Signed)
Patient ID: Trevor Fischer, male   DOB: [birth year], 51 y.o.   MRN: [id number] Reviewed and agree with this documentation and management.

## 2012-09-16 ENCOUNTER — Ambulatory Visit (INDEPENDENT_AMBULATORY_CARE_PROVIDER_SITE_OTHER): Payer: 59 | Admitting: Dietician

## 2012-09-16 ENCOUNTER — Other Ambulatory Visit: Payer: Self-pay | Admitting: Internal Medicine

## 2012-09-16 ENCOUNTER — Encounter: Payer: Self-pay | Admitting: Dietician

## 2012-09-16 VITALS — Ht 74.0 in | Wt 225.7 lb

## 2012-09-16 DIAGNOSIS — E119 Type 2 diabetes mellitus without complications: Secondary | ICD-10-CM

## 2012-09-16 MED ORDER — METFORMIN HCL 500 MG PO TABS: ORAL_TABLET | ORAL | Status: DC

## 2012-09-16 MED ORDER — INSULIN GLARGINE 100 UNIT/ML ~~LOC~~ SOLN: SUBCUTANEOUS | Status: DC

## 2012-09-16 NOTE — Patient Instructions (Addendum)
Increase metformin for the next few weeks as instructed.  Then please check a before and 2 hour after blood sugar for 3 meals a week in the next few weeks. Pleas write down what you eat for those meals.   [name] [phone]

## 2012-09-16 NOTE — Progress Notes (Signed)
Diabetes Self-Management Training (DSMT)   Visit 5- last seen in 11/2009 and 04/2010  09/16/2012 Mr. Deric Bocock, identified by name and date of birth, is a 51 y.o. male with diabetes -  Year of diabetes diagnosis: 2009 Other persons present: spouse/SO  ASSESSMENT Patient concerns are Glycemic control. And weight increasing  Height 6\' 2"  (1.88 m), weight 225 lb 11.2 oz (102.377 kg). Body mass index is 28.98 kg/(m^2). Lab Results  Component Value Date   LDLCALC 95 05/27/2012   Lab Results  Component Value Date   HGBA1C 7.6 08/19/2012     Medications See Medications list.  Has adequate knowledge and Is interested in learning more. Starting on metformin to assist with insulin sensitivity   Exercise Plan Doing walking  At work most days and and yard work for 60 minutesa day once in a while.   Self-Monitoring  Monitor: accu check aviva Frequency of testing: 4 times/day Meal 1 : 149, 175, 173,116 Meal 2: 136,   90, 147, 158 After 2: 275, 106 Meal 3 172, 120, 116,108 Hyperglycemia: Yes Weekly Hypoglycemia: No   Meal Planning Limited knowledge and Interested in improving   Assessment comments: patient kept 3 day food, insulin, CBG record: showed above blood sugars and ~1600 calories &   100-150  grams carb  INDIVIDUAL DIABETES EDUCATION PLAN:  Nutrition management Medication Monitoring Goal setting _______________________________________________________________  Intervention TOPICS COVERED TODAY:  Nutrition management  Role of diet in the treatment of diabetes and the relationship between the three main macronutritents and blood glucose control. Reviewed blood glucose goals for pre and post meals and how to evaluate the patients' food intake on their blood glucose level. Medication  Reviewed patients medication for diabetes, action, purpose, timing of dose and side effects.  PATIENTS GOALS/PLAN (copy and paste in patient instructions so patient receives a  copy): 1.  Learning Objective:       State acceptable rise in blood sugar after meal 2.  Behavioral Objective:         Nutrition: To improve blood glucose control I will limit bedtime snack to 200 calories Never 100 %  Self monitoring- I will check 3 post meal blood sugars to see if I still need therapy adjustment after metformin is increased.  25%  Personalized Follow-Up Plan for Ongoing Self Management Support:  Doctor's Office, family, CDE visits and [place] pharmacy visits and medlink ______________________________________________________________________   Outcomes Expected outcomes: Demonstrated interest in learning.Expect positive changes in lifestyle. Self-care Barriers: None Education material provided: yes Patient to contact team via Phone if problems or questions. Time in: 1330     Time out: 1400  Future DSMT - by email or phone in 2 months, then  6 months- 1 year   P[place], [name]

## 2012-09-17 ENCOUNTER — Other Ambulatory Visit: Payer: Self-pay | Admitting: Internal Medicine

## 2012-09-17 DIAGNOSIS — E119 Type 2 diabetes mellitus without complications: Secondary | ICD-10-CM

## 2012-09-17 MED ORDER — INSULIN GLARGINE 100 UNIT/ML ~~LOC~~ SOLN: 23.0000 [IU] | Freq: Every day | SUBCUTANEOUS | Status: DC

## 2012-09-17 NOTE — Progress Notes (Signed)
Patient tried splitting lantus and found it did not help blood sugars very much if at all, so he went back to 23 units once daily.

## 2012-10-14 ENCOUNTER — Telehealth: Payer: Self-pay | Admitting: Infectious Disease

## 2012-10-14 NOTE — Telephone Encounter (Signed)
Called and checked status on his application for Egrifta.  It is being reviewed by a case manager now.  Will check back next week to see if approved.

## 2012-10-18 ENCOUNTER — Telehealth: Payer: Self-pay | Admitting: Dietician

## 2012-10-18 NOTE — Telephone Encounter (Signed)
Trevor Fischer and partner have been approved for Egrifta medicine for HIV belly fat and the nurse who called told hi to contact his diabetes care team because this medicine often worsens or caused diabetes.   Suggested Trevor Fischer be started on correction insulin when he starts Egrifta and make a follow up appointment with Dr. Saverio Danker ~ 4 weeks after starting new medicine if he notes problems with blood sugars. His metformin also has room for titration. Partner reports they should be getting  The medicine in the next 2 weeks.    Will route this note to Dr. Bosie Clos for her input.

## 2012-10-30 NOTE — Telephone Encounter (Signed)
Spoke with Dr. Bosie Clos who advises patient to call if Egrifta raises blood sugars. Spoke to patient and he verbalized understanding and reports the metformin is really helping his blood sugar- his 30 day average is 116.

## 2012-11-04 ENCOUNTER — Other Ambulatory Visit: Payer: Self-pay | Admitting: *Deleted

## 2012-11-04 DIAGNOSIS — B20 Human immunodeficiency virus [HIV] disease: Secondary | ICD-10-CM

## 2012-11-04 MED ORDER — TESAMORELIN ACETATE 2 MG ~~LOC~~ SOLR: 2.0000 mg | Freq: Every day | SUBCUTANEOUS | Status: DC

## 2012-11-04 NOTE — Telephone Encounter (Signed)
Patient was approved effective 10/28/12-04/27/13 just needed to send RX to pharmacy.

## 2012-11-18 ENCOUNTER — Other Ambulatory Visit (INDEPENDENT_AMBULATORY_CARE_PROVIDER_SITE_OTHER): Payer: 59

## 2012-11-18 ENCOUNTER — Other Ambulatory Visit: Payer: Self-pay | Admitting: *Deleted

## 2012-11-18 DIAGNOSIS — B20 Human immunodeficiency virus [HIV] disease: Secondary | ICD-10-CM

## 2012-11-18 MED ORDER — ETRAVIRINE 200 MG PO TABS: 400.0000 mg | ORAL_TABLET | Freq: Every day | ORAL | Status: DC

## 2012-11-18 MED ORDER — EMTRICITABINE-TENOFOVIR DF 200-300 MG PO TABS: 1.0000 | ORAL_TABLET | Freq: Every day | ORAL | Status: DC

## 2012-11-19 LAB — COMPLETE METABOLIC PANEL WITH GFR
ALT: 27 U/L (ref 0–53)
AST: 28 U/L (ref 0–37)
Albumin: 4.4 g/dL (ref 3.5–5.2)
CO2: 25 mEq/L (ref 19–32)
Calcium: 9.3 mg/dL (ref 8.4–10.5)
Chloride: 103 mEq/L (ref 96–112)
Potassium: 4.3 mEq/L (ref 3.5–5.3)
Sodium: 137 mEq/L (ref 135–145)
Total Protein: 6.7 g/dL (ref 6.0–8.3)

## 2012-11-19 LAB — CBC WITH DIFFERENTIAL/PLATELET
Basophils Absolute: 0 10*3/uL (ref 0.0–0.1)
HCT: 41.9 % (ref 39.0–52.0)
Lymphocytes Relative: 38 % (ref 12–46)
Lymphs Abs: 2.3 10*3/uL (ref 0.7–4.0)
Neutro Abs: 3 10*3/uL (ref 1.7–7.7)
Platelets: 221 10*3/uL (ref 150–400)
RBC: 5.29 MIL/uL (ref 4.22–5.81)
RDW: 13.9 % (ref 11.5–15.5)
WBC: 6.2 10*3/uL (ref 4.0–10.5)

## 2012-11-19 LAB — RPR

## 2012-11-19 LAB — LIPID PANEL
LDL Cholesterol: 81 mg/dL (ref 0–99)
Triglycerides: 99 mg/dL (ref <150)
VLDL: 20 mg/dL (ref 0–40)

## 2012-11-19 LAB — HIV-1 RNA QUANT-NO REFLEX-BLD: HIV-1 RNA Quant, Log: <1.3 {Log} (ref <1.30)

## 2012-11-21 MED ORDER — INSULIN PEN NEEDLE 31G X 8 MM MISC: 1.0000 [IU] | Status: DC | PRN

## 2012-11-21 MED ORDER — LANCETS 28G MISC: 1.0000 [IU] | Status: DC | PRN

## 2012-11-21 MED ORDER — GLUCOSE BLOOD VI STRP: ORAL_STRIP | Status: DC

## 2012-11-29 ENCOUNTER — Other Ambulatory Visit: Payer: Self-pay | Admitting: Infectious Disease

## 2012-11-29 ENCOUNTER — Other Ambulatory Visit: Payer: Self-pay | Admitting: Internal Medicine

## 2012-12-09 ENCOUNTER — Ambulatory Visit (INDEPENDENT_AMBULATORY_CARE_PROVIDER_SITE_OTHER): Payer: Self-pay | Admitting: Family Medicine

## 2012-12-09 ENCOUNTER — Ambulatory Visit: Payer: 59 | Admitting: Infectious Disease

## 2012-12-09 DIAGNOSIS — E119 Type 2 diabetes mellitus without complications: Secondary | ICD-10-CM

## 2012-12-09 NOTE — Progress Notes (Signed)
Patient presents today for 3 month DM follow-up as part of the employer-sponsored link to [facility] program. Medications, insulin regimen, and glucose readings have been reviewed. I have also discussed with patient lifestyle interventions such as diet and exercise. Details of the visit can be found in [employer] documenting program through [employer] [facility]). Patient has set a series of personal goals and will follow-up in 3 months for further review of DM.  Patient seen by Drue Stager PharmD resident

## 2012-12-09 NOTE — Assessment & Plan Note (Signed)
DM is slightly above goal of less than 7% with an A1C of 7.6%. Patient seemed suprised with this result and wants. Encouraged patient to carefully watch BG and checking at different times to figure out when BG is high  Patient still eats a large snack/small meal before he goes to bed, but does usually limit bedtime snack to 15 g CHO. Also encouraged patient to continue physical activity   Follow up with patient in 3 months.  Patient seen by Drue Stager, PharmD Resident

## 2012-12-12 ENCOUNTER — Ambulatory Visit (INDEPENDENT_AMBULATORY_CARE_PROVIDER_SITE_OTHER): Payer: 59 | Admitting: Infectious Disease

## 2012-12-12 ENCOUNTER — Encounter: Payer: Self-pay | Admitting: Infectious Disease

## 2012-12-12 VITALS — BP 115/74 | HR 71 | Temp 98.5°F | Wt 217.1 lb

## 2012-12-12 DIAGNOSIS — B20 Human immunodeficiency virus [HIV] disease: Secondary | ICD-10-CM

## 2012-12-12 DIAGNOSIS — E119 Type 2 diabetes mellitus without complications: Secondary | ICD-10-CM

## 2012-12-12 MED ORDER — EMTRICITAB-RILPIVIR-TENOFOV DF 200-25-300 MG PO TABS: 1.0000 | ORAL_TABLET | Freq: Every day | ORAL | Status: DC

## 2012-12-12 NOTE — Progress Notes (Signed)
  Subjective:    Patient ID: Trevor Fischer, male    DOB: September 22, 1961, 52 y.o.   MRN: [id number]  HPI  Trevor Fischer is a 52 y.o. male who is doing superbly well on his  antiviral regimen, intelence and truvada  with undetectable viral load and health cd4 count.   Both he and his partner are here today again.  Apparently Trevor Fischer has had problems with diarrhea that he attributes to intelence (but he is certainly also on metformin).   I proposed change to Complera with 400-500 kcal of food with fat and avoidance completley of PPI, and no co-conmittant H2 blockers or antacids.   Rx written for 90d though he will not mak switch until late May   Review of Systems  Constitutional: Negative for fever, chills, diaphoresis, activity change, appetite change, fatigue and unexpected weight change.  HENT: Negative for congestion, sore throat, rhinorrhea, sneezing, trouble swallowing and sinus pressure.   Eyes: Negative for photophobia and visual disturbance.  Respiratory: Negative for cough, chest tightness, shortness of breath, wheezing and stridor.   Cardiovascular: Negative for chest pain, palpitations and leg swelling.  Gastrointestinal: Negative for nausea, vomiting, abdominal pain, diarrhea, constipation, blood in stool, abdominal distention and anal bleeding.  Genitourinary: Negative for dysuria, hematuria, flank pain and difficulty urinating.  Musculoskeletal: Negative for myalgias, back pain, joint swelling, arthralgias and gait problem.  Skin: Negative for color change, pallor, rash and wound.  Neurological: Negative for dizziness, tremors, weakness and light-headedness.  Hematological: Negative for adenopathy. Does not bruise/bleed easily.  Psychiatric/Behavioral: Negative for behavioral problems, confusion, sleep disturbance, dysphoric mood, decreased concentration and agitation.       Objective:   Physical Exam  Constitutional: He is oriented to person, place, and time.  He appears well-developed and well-nourished. No distress.  HENT:  Head: Normocephalic and atraumatic.  Mouth/Throat: Oropharynx is clear and moist. No oropharyngeal exudate.  Eyes: Conjunctivae and EOM are normal. Pupils are equal, round, and reactive to light. No scleral icterus.  Neck: Normal range of motion. Neck supple. No JVD present.  Cardiovascular: Normal rate, regular rhythm and normal heart sounds.  Exam reveals no gallop and no friction rub.   No murmur heard. Pulmonary/Chest: Effort normal and breath sounds normal. No respiratory distress. He has no wheezes. He has no rales. He exhibits no tenderness.  Abdominal: He exhibits no distension and no mass. There is no tenderness. There is no rebound and no guarding.  Musculoskeletal: He exhibits no edema and no tenderness.  Lymphadenopathy:    He has no cervical adenopathy.  Neurological: He is alert and oriented to person, place, and time. He has normal reflexes. He exhibits normal muscle tone. Coordination normal.  Skin: Skin is warm and dry. He is not diaphoretic. No erythema. No pallor.  Psychiatric: He has a normal mood and affect. His behavior is normal. Judgment and thought content normal.          Assessment & Plan:  HIV: he will finish 3 months of etravirine and truvada then change to complera with meal daily, recheck VL in month post change  Lipodytrophy due to HIV: egrifta supply on backorder due to manfacturer problem  DM: followed by Dr. Bosie Clos

## 2012-12-31 NOTE — Progress Notes (Signed)
Patient ID: Trevor Fischer, male   DOB: 10/22/1960, 51 y.o.   MRN: [id number] ATTENDING PHYSICIAN NOTE: I have reviewed the chart and agree with the plan as detailed above. Itzamara Casas MD Pager [phone]  

## 2013-01-07 ENCOUNTER — Other Ambulatory Visit: Payer: Self-pay | Admitting: Internal Medicine

## 2013-01-16 ENCOUNTER — Other Ambulatory Visit: Payer: Self-pay | Admitting: Internal Medicine

## 2013-02-04 ENCOUNTER — Other Ambulatory Visit: Payer: Self-pay | Admitting: Internal Medicine

## 2013-02-10 ENCOUNTER — Encounter: Payer: Self-pay | Admitting: *Deleted

## 2013-03-03 ENCOUNTER — Ambulatory Visit (INDEPENDENT_AMBULATORY_CARE_PROVIDER_SITE_OTHER): Payer: 59 | Admitting: Internal Medicine

## 2013-03-03 ENCOUNTER — Encounter: Payer: Self-pay | Admitting: Internal Medicine

## 2013-03-03 VITALS — BP 112/65 | HR 67 | Temp 97.7°F | Ht 74.0 in | Wt 220.4 lb

## 2013-03-03 DIAGNOSIS — I8390 Asymptomatic varicose veins of unspecified lower extremity: Secondary | ICD-10-CM

## 2013-03-03 DIAGNOSIS — R05 Cough: Secondary | ICD-10-CM

## 2013-03-03 DIAGNOSIS — R058 Other specified cough: Secondary | ICD-10-CM | POA: Insufficient documentation

## 2013-03-03 DIAGNOSIS — B20 Human immunodeficiency virus [HIV] disease: Secondary | ICD-10-CM

## 2013-03-03 DIAGNOSIS — E881 Lipodystrophy, not elsewhere classified: Secondary | ICD-10-CM

## 2013-03-03 DIAGNOSIS — I10 Essential (primary) hypertension: Secondary | ICD-10-CM

## 2013-03-03 DIAGNOSIS — J069 Acute upper respiratory infection, unspecified: Secondary | ICD-10-CM

## 2013-03-03 DIAGNOSIS — E119 Type 2 diabetes mellitus without complications: Secondary | ICD-10-CM

## 2013-03-03 DIAGNOSIS — I83893 Varicose veins of bilateral lower extremities with other complications: Secondary | ICD-10-CM

## 2013-03-03 LAB — GLUCOSE, CAPILLARY: Glucose-Capillary: 117 mg/dL — ABNORMAL HIGH (ref 70–99)

## 2013-03-03 LAB — POCT GLYCOSYLATED HEMOGLOBIN (HGB A1C): Hemoglobin A1C: 6.4

## 2013-03-03 MED ORDER — CHLORPHENIRAMINE MALEATE 4 MG PO TABS: 4.0000 mg | ORAL_TABLET | Freq: Two times a day (BID) | ORAL | Status: DC | PRN

## 2013-03-03 MED ORDER — GUAIFENESIN-CODEINE 100-10 MG/5ML PO SYRP: 5.0000 mL | ORAL_SOLUTION | Freq: Three times a day (TID) | ORAL | Status: DC | PRN

## 2013-03-03 NOTE — Progress Notes (Addendum)
  Subjective:    Patient ID: Trevor Fischer, male    DOB: [birth year], 52 y.o.   MRN: [id number]  HPI  Trevor Fischer is a 52 year old African American male with history significant for well-controlled HIV with undetectable viral load, diabetes mellitus type 2 with fair control hemoglobin A1c 7.6 (08/2012), lipodystrophy of the abdomen, hypertension, echo lipidemia, and varicose veins of the lower extremities. He presents today with complaints of runny nose and cough for approximately 2 weeks. States that then drainage is clear and a cough often worsens at night when he lays down. He reports that he doesn't normally have seasonal allergies and has not tried any over the counter remedies. Of note he has been participating in the [facility] and gets his diabetes managed through [facility]. Denies fever, chest pain, shortness of breath, headaches, sneezing, purulent nasal drainage or phlegm production.   Review of Systems As per history of present illness otherwise negative    Objective:   Physical Exam  Constitutional: He is oriented to person, place, and time. He appears well-developed and well-nourished. No distress.  HENT:  Head: Normocephalic and atraumatic.  Right Ear: External ear normal.  Left Ear: External ear normal.  Nose: Mucosal edema and rhinorrhea present. No sinus tenderness.  Mouth/Throat: Uvula is midline, oropharynx is clear and moist and mucous membranes are normal. No oropharyngeal exudate or posterior oropharyngeal erythema.    Clear post nasal drainage webbing noted in posterior pharynx  Eyes: EOM are normal. Pupils are equal, round, and reactive to light.  Conjunctive with mild injection bilaterally  Cardiovascular: Normal rate, regular rhythm, normal heart sounds and intact distal pulses.   No murmur heard. Pulmonary/Chest: Effort normal and breath sounds normal. No respiratory distress.  Abdominal: Soft. Bowel sounds are normal. He  exhibits no distension. There is no tenderness.  Musculoskeletal: Normal range of motion. He exhibits no edema.  Neurological: He is alert and oriented to person, place, and time.  Skin: Skin is warm and dry.  Psychiatric: He has a normal mood and affect.          Assessment & Plan:  1. Seasonal Allergies vs URI: will give a course of 1st generation anti-histamine to help dry nasal secretions which are likely causing Upper Airway Cough Syndrome.  If continues or worsens consider antibiotic for bacterial URI  2. Upper Airway Cough Syndrome: try chlorphenarime and cough syrup  3. Diabetes Mellitus: fair controlled on last HgbA1c check, [facility]; glucometer with 100% reading in range -check HgbA1c today--->6.4 -cont current regimen Metformin 500 mg bid and Lantus 23 units qd -Eye appt with Trevor Fischer in Oct 2014  4. Hypertension: controlled on verapamil 240 mg qd , triamterene-HCT 7550 qd and lisinopril 20 mg qd  5. 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

## 2013-03-03 NOTE — Patient Instructions (Signed)
General Instructions: Follow-up with the [facility] as scheduled. I have prescribed an anti-histamine for your nasal congestion and post-nasal drip.  This may be contributing to Upper Airway Cough Syndrome. I have also given you a prescription for a cough syrup.  If you develop fevers or worsening in your nasal discharge, contact the clinic.   Treatment Goals:  Goals (1 Years of Data) as of 03/03/13         As of Today 12/12/12 11/18/12 08/19/12 07/03/12     Blood Pressure    . Blood Pressure < 140/90  112/65 115/74  124/76 133/64     Result Component    . HEMOGLOBIN A1C < 7.0     7.6     . LDL CALC < 100    81        Progress Toward Treatment Goals:  Treatment Goal 03/03/2013  Hemoglobin A1C at goal  Blood pressure at goal    Self Care Goals & Plans:  Self Care Goal 03/03/2013  Manage my medications take my medicines as prescribed; bring my medications to every visit; refill my medications on time  Eat healthy foods drink diet soda or water instead of juice or soda; eat more vegetables; eat foods that are low in salt    Home Blood Glucose Monitoring 03/03/2013  Check my blood sugar 3 times a day     Care Management & Community Referrals:    Chlorpheniramine tablets What is this medicine? CHLORPHENIRAMINE (klor fen IR a meen) is an antihistamine. It is used to treat a runny nose from allergies or a cold. It is also used to treat the symptoms an allergic reaction. This medicine will not treat an infection. This medicine may be used for other purposes; ask your health care provider or pharmacist if you have questions. What should I tell my health care provider before I take this medicine? They need to know if you have any of these conditions: -glaucoma -heart disease -high blood pressure -lung or breathing disease, like asthma -pain or difficulty passing urine -prostate trouble -ulcers or other stomach problems -an unusual or allergic reaction to chlorpheniramine, other  medicines, foods, dyes, or preservatives -pregnant or trying to get pregnant -breast-feeding How should I use this medicine? Take this medicine by mouth with a full glass of water. Follow the directions on the prescription label. Take your doses at regular intervals. Do not take your medicine more often than directed. Talk to your pediatrician regarding the use of this medicine in children. While this drug may be prescribed for selected conditions, precautions do apply. Patients over 77 years old may have a stronger reaction and need a smaller dose. Overdosage: If you think you have taken too much of this medicine contact a poison control center or emergency room at once. NOTE: This medicine is only for you. Do not share this medicine with others. What if I miss a dose? If you miss a dose, take it as soon as you can. If it is almost time for your next dose, take only that dose. Do not take double or extra doses. What may interact with this medicine? -alcohol -barbiturate medicines for sleep or treating seizures -MAOIs like Carbex, Eldepryl, Marplan, Nardil, and Parnate -medicines for allergies -medicines for depression, anxiety, or psychotic disturbances -medicines for sleep -some antibiotics This list may not describe all possible interactions. Give your health care provider a list of all the medicines, herbs, non-prescription drugs, or dietary supplements you use. Also tell them  if you smoke, drink alcohol, or use illegal drugs. Some items may interact with your medicine. What should I watch for while using this medicine? Visit your doctor or health care professional for regular check ups. Tell your doctor if your symptoms do not improve or if they get worse. Your mouth may get dry. Chewing sugarless gum or sucking hard candy, and drinking plenty of water may help. Contact your doctor if the problem does not go away or is severe. This medicine may cause dry eyes and blurred vision. If you  wear contact lenses you may feel some discomfort. Lubricating drops may help. See your eye doctor if the problem does not go away or is severe. You may get drowsy or dizzy. Do not drive, use machinery, or do anything that needs mental alertness until you know how this medicine affects you. Do not stand or sit up quickly, especially if you are an older patient. This reduces the risk of dizzy or fainting spells. Alcohol may interfere with the effect of this medicine. Avoid alcoholic drinks. This medicine can make you more sensitive to the sun. Keep out of the sun. If you cannot avoid being in the sun, wear protective clothing and use sunscreen. Do not use sun lamps or tanning beds/booths. What side effects may I notice from receiving this medicine? Side effects that you should report to your doctor or health care professional as soon as possible: -allergic reactions like skin rash, itching or hives, swelling of the face, lips, or tongue -breathing problems -changes in vision -confused, agitated, nervous -fast, irregular heartbeat -feeling faint, dizzy -seizures -tremor -trouble passing urine or change in the amount of urine -unusual sweating -unusually weak or tired Side effects that usually do not require medical attention (report to your doctor or health care professional if they continue or are bothersome): -constipation or diarrhea -drowsy -dry mouth, nose, throat -headache -loss of appetite -stomach upset, vomiting -trouble sleeping This list may not describe all possible side effects. Call your doctor for medical advice about side effects. You may report side effects to FDA at 1-800-FDA-1088. Where should I keep my medicine? Keep out of the reach of children. Store at room temperature between 15 and 30 degrees C (59 and 86 degrees F). Keep container tightly closed. Throw away any unused medicine after the expiration date. NOTE: This sheet is a summary. It may not cover all possible  information. If you have questions about this medicine, talk to your doctor, pharmacist, or health care provider.  2013, [employer]vier/Gold Standard. (01/01/2008 5:37:35 PM)

## 2013-03-03 NOTE — Assessment & Plan Note (Signed)
BP Readings from Last 3 Encounters:  03/03/13 112/65  12/12/12 115/74  08/19/12 124/76    Lab Results  Component Value Date   NA 137 11/18/2012   K 4.3 11/18/2012   CREATININE 1.10 11/18/2012    Assessment: Blood pressure control: controlled Progress toward BP goal:  at goal Comments: on triamterene-HCT 7550 mg qd, verapamil 240 mg qd, lisinopril 20 mg qd  Plan: Medications:  continue current medications Educational resources provided: brochure Self management tools provided:   Other plans: cont current regimen

## 2013-03-03 NOTE — Assessment & Plan Note (Signed)
Lab Results  Component Value Date   HGBA1C 7.6 08/19/2012   HGBA1C 6.7* 06/12/2011   HGBA1C 6.2 03/09/2011     Assessment: Diabetes control: good control (HgbA1C at goal) Progress toward A1C goal:  at goal Comments: [facility]  Plan: Medications:  continue current medications Home glucose monitoring: Frequency: 3 times a day Timing:   Instruction/counseling given: reminded to get eye exam Educational resources provided: brochure Self management tools provided: copy of home glucose meter download;home glucose logbook Other plans: will check HgbA1c today, pt [facility]

## 2013-03-03 NOTE — Assessment & Plan Note (Signed)
Prescription for TED hose given today

## 2013-03-07 NOTE — Progress Notes (Signed)
TEACHING ATTENDING ADDENDUM: I discussed this case with Dr. Schooler at the time of the patient visit. I agree with the HPI, exam findings and have read the documentation provided by the resident,  and I concur with the plan of care. Please see the resident note for details of management.    

## 2013-03-14 ENCOUNTER — Ambulatory Visit (INDEPENDENT_AMBULATORY_CARE_PROVIDER_SITE_OTHER): Payer: Self-pay | Admitting: Family Medicine

## 2013-03-14 VITALS — BP 115/66 | HR 60 | Ht 74.0 in | Wt 217.4 lb

## 2013-03-14 DIAGNOSIS — E119 Type 2 diabetes mellitus without complications: Secondary | ICD-10-CM

## 2013-03-14 NOTE — Progress Notes (Signed)
Subjective:  Patient presents today for 3 month diabetes follow-up as part of the employer-sponsored [facility] program. Current diabetes regimen includes Metformin, Novolog, and Lantus. Patient also continues on daily ASA, ACEi, and statin. Most recent MD follow-up was with Dr. Bosie Clos ~ 1week prior.  He has had some changes to his ART regimen since his last visit by Dr. Algis Liming. No major health changes at this time. Overall doing well, but tired. He had allergy/sinus issues that have made him feel worn down. Dr. Bosie Clos is aware and helping him with this, he feels his new allergy medication does help.    Disease Assessments:  Diabetes:  Type of Diabetes: Type 2; Year of diagnosis 2010; MD managing Diabetes Dr. Kristie Cowman; Sees Diabetes provider 1 time a year; checks blood glucose 3-4 times a day; checks feet daily; uses glucometer; takes medications as prescribed; takes an aspirin a day; hypoglycemia frequency <1x/week;   7 day CBG average 115; 14 day CBG average 112; 30 day CBG average 113; Lowest CBG 80; Highest CBG 154;   Other Diabetes History: has been checking BG frequently (4x day) with an average reading of 115. Lowest seen was ~80, highest of 154. Dinner is largest meal of the day with same Basal dose, but nighttime reading is not high. Has not had any hypoglycemic events since last visit.    Exercise- yard work, walking 1-3x per week   Diet- Not counting carbs but using portion control. Trying to eat a salad every day-every other day during the week. Still eating some bread. Rice and pasta are weakness. Doesn't feel like sweets are his weakness.     Testing:  Blood Sugar Tests:  Hemoglobin A1c: 6.5   Assessment/Plan: DM is at goal of less than 7% with an A1C of 6.5%.   Patient still eats a large snack/small meal before he goes to bed, but does usually limit bedtime snack to 15 g CHO. He is doing much better overall at limiting his sweets and overall CHO intake. Also  encouraged patient to continue physical activity which he admits has been more infrequent recently.  Follow up with patient in 3 months.  Patient seen by resident, Drue Stager, PharmD.   Goals for Next Visit-  You have been doing great, your goals to work on are: 1. Try to walk more with a goal of 3-4x a week for 40 minute periods. 2. Continue your good dietary habits eating more fresh fruits and vegetables and less sweets 3. Continue your current medications and frequent blood sugar checks  We will see you back in 3 months 06/09/13 at 2:00

## 2013-03-24 NOTE — Progress Notes (Signed)
Patient ID: Trevor Fischer, male   DOB: 01-27-1961, 52 y.o.   MRN: [id number] ATTENDING PHYSICIAN NOTE: I have reviewed the chart and agree with the plan as detailed above. Denny Levy MD Pager [phone]

## 2013-03-27 ENCOUNTER — Other Ambulatory Visit: Payer: 59

## 2013-03-27 DIAGNOSIS — B20 Human immunodeficiency virus [HIV] disease: Secondary | ICD-10-CM

## 2013-03-28 LAB — T-HELPER CELL (CD4) - (RCID CLINIC ONLY): CD4 % Helper T Cell: 36 % (ref 33–55)

## 2013-04-07 ENCOUNTER — Other Ambulatory Visit: Payer: Self-pay | Admitting: Internal Medicine

## 2013-04-10 ENCOUNTER — Other Ambulatory Visit: Payer: Self-pay

## 2013-04-10 ENCOUNTER — Ambulatory Visit: Payer: 59 | Admitting: Infectious Disease

## 2013-04-16 ENCOUNTER — Ambulatory Visit (INDEPENDENT_AMBULATORY_CARE_PROVIDER_SITE_OTHER): Payer: 59 | Admitting: Infectious Disease

## 2013-04-16 ENCOUNTER — Encounter: Payer: Self-pay | Admitting: Infectious Disease

## 2013-04-16 VITALS — BP 125/79 | HR 65 | Temp 98.1°F | Ht 74.0 in | Wt 214.0 lb

## 2013-04-16 DIAGNOSIS — E119 Type 2 diabetes mellitus without complications: Secondary | ICD-10-CM

## 2013-04-16 DIAGNOSIS — E881 Lipodystrophy, not elsewhere classified: Secondary | ICD-10-CM

## 2013-04-16 DIAGNOSIS — B20 Human immunodeficiency virus [HIV] disease: Secondary | ICD-10-CM

## 2013-04-16 DIAGNOSIS — M25519 Pain in unspecified shoulder: Secondary | ICD-10-CM

## 2013-04-16 DIAGNOSIS — M25511 Pain in right shoulder: Secondary | ICD-10-CM

## 2013-04-16 DIAGNOSIS — E785 Hyperlipidemia, unspecified: Secondary | ICD-10-CM

## 2013-04-16 MED ORDER — ATORVASTATIN CALCIUM 80 MG PO TABS
80.0000 mg | ORAL_TABLET | Freq: Every day | ORAL | Status: DC
Start: 2013-04-16 — End: 2013-04-16

## 2013-04-16 MED ORDER — ATORVASTATIN CALCIUM 80 MG PO TABS: 80.0000 mg | ORAL_TABLET | Freq: Every day | ORAL | Status: DC

## 2013-04-16 NOTE — Progress Notes (Signed)
  Subjective:    Patient ID: Trevor Fischer, male    DOB: [birth year], 52 y.o.   MRN: [id number]  HPI Trevor Fischer is a 52 y.o. male who is doing superbly well on his  antiviral regimen complera with undetectable viral load and health cd4 count.   Both he and his partner are here today again.   Rashad  had problems with higher co-pay with the Crestor 40 mg which she's currently taking and which has dropped his LDL into the 80s. We explored other options I've written a prescription for Lipitor at 80 mg per day. We'll recheck his fasting lipids in 3 months time to see if this medicine will keep him at goal of less than 100.  He has also noted some pains in his shoulders bilaterally with exertion but no chronic pain at rest. I warned him if he developed this would need to worry about statin-induced myositis.   Review of Systems  Constitutional: Negative for fever, chills, diaphoresis, activity change, appetite change, fatigue and unexpected weight change.  HENT: Negative for congestion, sore throat, rhinorrhea, sneezing, trouble swallowing and sinus pressure.   Eyes: Negative for photophobia and visual disturbance.  Respiratory: Negative for cough, chest tightness, shortness of breath, wheezing and stridor.   Cardiovascular: Negative for palpitations and leg swelling.  Gastrointestinal: Negative for nausea, vomiting, abdominal pain, diarrhea, constipation, blood in stool, abdominal distention and anal bleeding.  Genitourinary: Negative for dysuria, hematuria, flank pain and difficulty urinating.  Musculoskeletal: Negative for myalgias, back pain, joint swelling, arthralgias and gait problem.  Skin: Negative for color change, pallor, rash and wound.  Neurological: Negative for dizziness, tremors, weakness and light-headedness.  Hematological: Negative for adenopathy. Does not bruise/bleed easily.  Psychiatric/Behavioral: Negative for behavioral problems, confusion, sleep  disturbance, dysphoric mood, decreased concentration and agitation.       Objective:   Physical Exam  Constitutional: He is oriented to person, place, and time. He appears well-developed and well-nourished. No distress.  HENT:  Head: Normocephalic and atraumatic.  Mouth/Throat: Oropharynx is clear and moist. No oropharyngeal exudate.  Eyes: Conjunctivae and EOM are normal. Pupils are equal, round, and reactive to light. No scleral icterus.  Neck: Normal range of motion. Neck supple. No JVD present.  Cardiovascular: Normal rate, regular rhythm and normal heart sounds.  Exam reveals no gallop and no friction rub.   No murmur heard. Pulmonary/Chest: Effort normal and breath sounds normal. No respiratory distress. He has no wheezes. He has no rales. He exhibits no tenderness.  Abdominal: He exhibits no distension and no mass. There is no tenderness. There is no rebound and no guarding.  Musculoskeletal: He exhibits no edema and no tenderness.  Lymphadenopathy:    He has no cervical adenopathy.  Neurological: He is alert and oriented to person, place, and time. He has normal reflexes. He exhibits normal muscle tone. Coordination normal.  Skin: Skin is warm and dry. He is not diaphoretic. No erythema. No pallor.  Psychiatric: He has a normal mood and affect. His behavior is normal. Judgment and thought content normal.          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 Bosie Clos  Hyperlipidemia: try 53 of lipitor if it is cheaper and can keep him <100, i worry it will NOT  SHoulder pain bilateral: sounds exertional

## 2013-05-23 ENCOUNTER — Other Ambulatory Visit: Payer: Self-pay | Admitting: *Deleted

## 2013-05-23 MED ORDER — METFORMIN HCL 500 MG PO TABS: 500.0000 mg | ORAL_TABLET | Freq: Two times a day (BID) | ORAL | Status: DC

## 2013-06-13 ENCOUNTER — Ambulatory Visit (INDEPENDENT_AMBULATORY_CARE_PROVIDER_SITE_OTHER): Payer: Self-pay | Admitting: Family Medicine

## 2013-06-13 VITALS — BP 135/70 | HR 69 | Ht 74.0 in | Wt 209.0 lb

## 2013-06-13 DIAGNOSIS — E119 Type 2 diabetes mellitus without complications: Secondary | ICD-10-CM

## 2013-06-13 NOTE — Progress Notes (Signed)
Subjective:  Patient presents today for 3 month diabetes follow-up as part of the employer-sponsored [facility] program. Current diabetes regimen includes Metformin 500 mg BID, Novolog 5 units with meals and Lantus 23 units once daily. Patient also continues on daily ASA, ACEi, and statin. Most recent MD follow-up was in June. No major health changes at this time.   Patient has lost 9 pounds since his last appointment with me and he is doing very well. A few med changes- added 3 dermatological preps for Acne and dry skin.    Disease Assessments:  Diabetes:  Type of Diabetes: Type 2; Year of diagnosis 2010; MD managing Diabetes Dr. Kristie Cowman; Sees Diabetes provider 1 time a year; checks blood glucose 3-4 times a day; checks feet daily; uses glucometer; takes medications as prescribed; takes an aspirin a day;   Highest CBG 226; Lowest CBG 63; hypoglycemia frequency <1x/week; 7 day CBG average 102; 14 day CBG average 105; 30 day CBG average 100;   Other Diabetes History: He is checking his blood sugar 3-4 times daily. Fasting, before meals and then again at bedtime.  The majority of readings on his meter are around 90s-100s.   A1C today was 6.3% and is meeting his goal.    A1C today was 6.3%- this is consistent with his last A1C of 6.4% in June 2014.       Physical Activity-   He reports walking 5 times a week for 90 minutes. He is also doing yard work.   Nutrition-  He is cutting back on sweets and overall portion sizes. He states that he is making an effort to eat healthier. He has cut back on snacking and is trying to eat fruits and vegetables when he needs a snack.         Preventive Care:    Hemoglobin A1c: 03/03/2013    Dilated Eye Exam: 07/17/2012  Foot Exam: 08/06/2012       Vital Signs:  06/13/2013 10:21 AM (EST) Blood Pressure 135 / 70 mm/HgBMI 26.8; Height 6 ft 2 in; Pulse Rate 69 bpm; Weight 209 lbs      Assessment/Plan: Patient is a 52 year old  male with DM2. A1C today was 6.3% and is meeting glycemic goal of less than 7%. Patient has made significant changes to his lifestyle and diet since his last appointment and has lost 9 pounds. This represents a 5% loss in body weight as compared to his last appointment with me. I congratulated patient on his success and encouraged him to keep up with these changes. He reports that he feels so much better now that he has been exercising on a regular basis. He wants to continue losing weight and is hoping to be down to 200 pounds by the end of the year.   Patient reported his is occasionally experiencing hypoglycemia. I explained to him that as he loses weight he may not need as much insulin. I advised him to monitor for hypoglycemia and that if he was experiencing frequent hypoglycemia his Lantus dose would likely need to be decreased.   I will fax A1C results to his PCP, Dr. Bosie Clos. Follow up with patient in 3 months..    Goals for Next Visit-  1. Continue physical activity.  2. Continue making healthy eating choices.  3. Monitor for low blood sugars as you continue to lose weight. You may need to drop back on your Lantus if you continue to have low blood sugars (<60 mg/dL).  Next appointment to see me is Friday December 12th at 10 AM.

## 2013-07-02 ENCOUNTER — Other Ambulatory Visit: Payer: Self-pay | Admitting: Internal Medicine

## 2013-07-08 ENCOUNTER — Other Ambulatory Visit: Payer: Self-pay | Admitting: *Deleted

## 2013-07-08 ENCOUNTER — Other Ambulatory Visit: Payer: Self-pay | Admitting: Infectious Disease

## 2013-07-08 DIAGNOSIS — I10 Essential (primary) hypertension: Secondary | ICD-10-CM

## 2013-07-08 MED ORDER — VERAPAMIL HCL ER 240 MG PO TBCR: EXTENDED_RELEASE_TABLET | ORAL | Status: DC

## 2013-07-22 ENCOUNTER — Other Ambulatory Visit: Payer: Self-pay | Admitting: Infectious Disease

## 2013-08-07 ENCOUNTER — Other Ambulatory Visit: Payer: Self-pay

## 2013-08-15 ENCOUNTER — Telehealth: Payer: Self-pay | Admitting: Dietician

## 2013-08-15 ENCOUNTER — Encounter: Payer: Self-pay | Admitting: Internal Medicine

## 2013-08-15 ENCOUNTER — Ambulatory Visit (INDEPENDENT_AMBULATORY_CARE_PROVIDER_SITE_OTHER): Payer: 59 | Admitting: Internal Medicine

## 2013-08-15 VITALS — BP 133/73 | HR 68 | Wt 214.0 lb

## 2013-08-15 DIAGNOSIS — B20 Human immunodeficiency virus [HIV] disease: Secondary | ICD-10-CM

## 2013-08-15 DIAGNOSIS — I10 Essential (primary) hypertension: Secondary | ICD-10-CM

## 2013-08-15 DIAGNOSIS — E1149 Type 2 diabetes mellitus with other diabetic neurological complication: Secondary | ICD-10-CM

## 2013-08-15 DIAGNOSIS — E1140 Type 2 diabetes mellitus with diabetic neuropathy, unspecified: Secondary | ICD-10-CM

## 2013-08-15 DIAGNOSIS — J069 Acute upper respiratory infection, unspecified: Secondary | ICD-10-CM

## 2013-08-15 DIAGNOSIS — E785 Hyperlipidemia, unspecified: Secondary | ICD-10-CM

## 2013-08-15 DIAGNOSIS — E1142 Type 2 diabetes mellitus with diabetic polyneuropathy: Secondary | ICD-10-CM

## 2013-08-15 MED ORDER — GABAPENTIN 300 MG PO CAPS: 300.0000 mg | ORAL_CAPSULE | Freq: Three times a day (TID) | ORAL | Status: DC

## 2013-08-15 NOTE — Progress Notes (Signed)
  Subjective:    Patient ID: Trevor Fischer, male    DOB: Sep 08, 1961, 52 y.o.   MRN: [id number]  HPI  Mr. Goodwyn is a 52 year old African American male with history significant for well-controlled HIV with undetectable viral load, diabetes mellitus type 2 with fair control hemoglobin A1c 6.3 (Sept 2014), lipodystrophy of the abdomen, hypertension, hyperlipidemia, and varicose veins of the lower extremities.  Of note he has been participating in the [facility] and gets his diabetes managed through [facility]. Denies fever, chest pain, shortness of breath, headaches, sneezing, purulent nasal drainage or phlegm production. States that he has had bilateral feet tingling. Reports that TED hose has been working very well for his lower extremity swelling on his long shifts days at work.   Review of Systems  Constitutional: Negative for fever and fatigue.  HENT: Negative.   Eyes: Negative.   Respiratory: Negative.   Cardiovascular: Negative.   Gastrointestinal: Negative.   Endocrine: Negative.   Genitourinary: Negative.   Musculoskeletal: Negative.   Skin: Negative.   Allergic/Immunologic: Negative.   Neurological: Positive for numbness.       Bilateral feet with tingling  Hematological: Negative.   Psychiatric/Behavioral: Negative.        Objective:   Physical Exam  Constitutional: He is oriented to person, place, and time. He appears well-developed and well-nourished. No distress.  HENT:  Head: Normocephalic and atraumatic.  Eyes: Conjunctivae and EOM are normal. Pupils are equal, round, and reactive to light.  Neck: Normal range of motion. Neck supple. No thyromegaly present.  Cardiovascular: Normal rate, regular rhythm, normal heart sounds and intact distal pulses.   No murmur heard. Pulmonary/Chest: Effort normal and breath sounds normal.  Abdominal: Soft. Bowel sounds are normal. There is no tenderness.  Musculoskeletal: He exhibits no edema.   Neurological: He is alert and oriented to person, place, and time. He has normal strength and normal reflexes. No cranial nerve deficit or sensory deficit.  Skin: Skin is warm and dry.  Psychiatric: He has a normal mood and affect.          Assessment & Plan:  See Separate problem list charting:  #1 diabetes mellitus, controlled with neuropathy: Most recent hemoglobin A1c 6.3 on metformin 500 mg twice a day and Lantus 23 units qhs -Continue current regimen -and gabapentin 300 mg 3 times a day and titrate upwards as needed -Continue [facility] for diabetes  #2 hypertension: Controlled on lisinopril 20 mg daily, triamterene-hydrochlorothiazide 75-50 mg daily and verapamil 240 mg daily  #3 hyperlipidemia: Recently increased Lipitor 80 mg daily per infectious disease provider -Patient scheduled for lipid panel at [facility]

## 2013-08-15 NOTE — Assessment & Plan Note (Addendum)
Lab Results  Component Value Date   HGBA1C 6.4 03/03/2013   HGBA1C 7.6 08/19/2012   HGBA1C 6.7* 06/12/2011     Assessment: Diabetes control: good control (HgbA1C at goal) Progress toward A1C goal:  at goal Comments: follows with [facility], c/o feet tingling  Plan: Medications:  continue current medications Home glucose monitoring: Frequency:   Timing:   Instruction/counseling given: discussed foot care Educational resources provided: brochure Self management tools provided:   Other plans: cont metformin 500 mg bid and Lantus 23 units qhs, cont to follow with [facility] -foot exam today -eye exam Oct 2104 -start Gabapentin 300 mg tid

## 2013-08-15 NOTE — Assessment & Plan Note (Signed)
Lipitor increased to 80 mg, lipid panel at visit with Dr. Daiva Eves

## 2013-08-15 NOTE — Assessment & Plan Note (Signed)
BP Readings from Last 3 Encounters:  08/15/13 133/73  06/13/13 135/70  04/16/13 125/79    Lab Results  Component Value Date   NA 137 11/18/2012   K 4.3 11/18/2012   CREATININE 1.10 11/18/2012    Assessment: Blood pressure control: controlled Progress toward BP goal:  at goal Comments: on triamterene-HCT 75-50  Plan: Medications:  continue current medications Educational resources provided: brochure Self management tools provided:   Other plans: no changes today

## 2013-08-15 NOTE — Telephone Encounter (Signed)
Patient was there 07-17-13. They are faxing the results

## 2013-08-19 ENCOUNTER — Other Ambulatory Visit: Payer: 59

## 2013-08-19 DIAGNOSIS — Z113 Encounter for screening for infections with a predominantly sexual mode of transmission: Secondary | ICD-10-CM

## 2013-08-19 DIAGNOSIS — E119 Type 2 diabetes mellitus without complications: Secondary | ICD-10-CM

## 2013-08-19 DIAGNOSIS — B20 Human immunodeficiency virus [HIV] disease: Secondary | ICD-10-CM

## 2013-08-19 LAB — COMPLETE METABOLIC PANEL WITH GFR
ALT: 36 U/L (ref 0–53)
Albumin: 4.1 g/dL (ref 3.5–5.2)
Alkaline Phosphatase: 86 U/L (ref 39–117)
BUN: 18 mg/dL (ref 6–23)
CO2: 30 mEq/L (ref 19–32)
Calcium: 9.2 mg/dL (ref 8.4–10.5)
Chloride: 102 mEq/L (ref 96–112)
GFR, Est African American: 89 mL/min
GFR, Est Non African American: 77 mL/min
Glucose, Bld: 90 mg/dL (ref 70–99)
Potassium: 4.1 mEq/L (ref 3.5–5.3)
Total Protein: 6.4 g/dL (ref 6.0–8.3)

## 2013-08-19 LAB — CBC WITH DIFFERENTIAL/PLATELET
Basophils Relative: 1 % (ref 0–1)
Eosinophils Relative: 7 % — ABNORMAL HIGH (ref 0–5)
HCT: 39.5 % (ref 39.0–52.0)
Hemoglobin: 13.5 g/dL (ref 13.0–17.0)
MCHC: 34.2 g/dL (ref 30.0–36.0)
Monocytes Absolute: 0.5 10*3/uL (ref 0.1–1.0)
Monocytes Relative: 10 % (ref 3–12)
Neutro Abs: 2.5 10*3/uL (ref 1.7–7.7)
RBC: 4.92 MIL/uL (ref 4.22–5.81)

## 2013-08-19 LAB — LIPID PANEL
Cholesterol: 124 mg/dL (ref 0–200)
LDL Cholesterol: 80 mg/dL (ref 0–99)

## 2013-08-19 NOTE — Progress Notes (Signed)
Case discussed with Dr. Schooler soon after the resident saw the patient.  We reviewed the resident's history and exam and pertinent patient test results.  I agree with the assessment, diagnosis, and plan of care documented in the resident's note. 

## 2013-08-20 LAB — HIV-1 RNA QUANT-NO REFLEX-BLD: HIV-1 RNA Quant, Log: <1.3 {Log} (ref <1.30)

## 2013-08-20 LAB — T-HELPER CELL (CD4) - (RCID CLINIC ONLY): CD4 % Helper T Cell: 34 % (ref 33–55)

## 2013-09-02 ENCOUNTER — Ambulatory Visit (INDEPENDENT_AMBULATORY_CARE_PROVIDER_SITE_OTHER): Payer: 59 | Admitting: Infectious Disease

## 2013-09-02 ENCOUNTER — Encounter: Payer: Self-pay | Admitting: Infectious Disease

## 2013-09-02 VITALS — BP 122/76 | HR 62 | Temp 98.1°F | Ht 74.0 in | Wt 214.0 lb

## 2013-09-02 DIAGNOSIS — I10 Essential (primary) hypertension: Secondary | ICD-10-CM

## 2013-09-02 DIAGNOSIS — E785 Hyperlipidemia, unspecified: Secondary | ICD-10-CM

## 2013-09-02 DIAGNOSIS — B20 Human immunodeficiency virus [HIV] disease: Secondary | ICD-10-CM

## 2013-09-02 DIAGNOSIS — E109 Type 1 diabetes mellitus without complications: Secondary | ICD-10-CM

## 2013-09-02 DIAGNOSIS — Z23 Encounter for immunization: Secondary | ICD-10-CM

## 2013-09-02 NOTE — Progress Notes (Signed)
  Subjective:    Patient ID: Trevor Fischer, male    DOB: Oct 23, 1960, 52 y.o.   MRN: [id number]  HPI  Trevor Fischer is a 52 y.o. male who is doing superbly well on his  antiviral regimen complera with undetectable viral load and health cd4 count.   Both he and his partner are here today again.   Loki  had problems with higher co-pay with the Crestor 40 mg which she's currently taking and which has dropped his LDL into the 80s. We changed to  other options  Lipitor at 80 mg per day and he is at goal  Also offered him the possibility of being enrolled into an AIDS clinical trial group trial number ACTG [id number] a randomized trial comparing placebo versus low-dose methotrexate, both with folic acid supplementation and with escalation of the methotrexate dose through a 24 week course and a total of 36 weeks and the study.  Review of Systems  Constitutional: Negative for fever, chills, diaphoresis, activity change, appetite change, fatigue and unexpected weight change.  HENT: Negative for congestion, rhinorrhea, sinus pressure, sneezing, sore throat and trouble swallowing.   Eyes: Negative for photophobia and visual disturbance.  Respiratory: Negative for cough, chest tightness, shortness of breath, wheezing and stridor.   Cardiovascular: Negative for palpitations and leg swelling.  Gastrointestinal: Negative for nausea, vomiting, abdominal pain, diarrhea, constipation, blood in stool, abdominal distention and anal bleeding.  Genitourinary: Negative for dysuria, hematuria, flank pain and difficulty urinating.  Musculoskeletal: Negative for arthralgias, back pain, gait problem, joint swelling and myalgias.  Skin: Negative for color change, pallor, rash and wound.  Neurological: Negative for dizziness, tremors, weakness and light-headedness.  Hematological: Negative for adenopathy. Does not bruise/bleed easily.  Psychiatric/Behavioral: Negative for behavioral problems, confusion, sleep  disturbance, dysphoric mood, decreased concentration and agitation.       Objective:   Physical Exam  Constitutional: He is oriented to person, place, and time. He appears well-developed and well-nourished. No distress.  HENT:  Head: Normocephalic and atraumatic.  Mouth/Throat: Oropharynx is clear and moist. No oropharyngeal exudate.  Eyes: Conjunctivae and EOM are normal. Pupils are equal, round, and reactive to light. No scleral icterus.  Neck: Normal range of motion. Neck supple. No JVD present.  Cardiovascular: Normal rate, regular rhythm and normal heart sounds.  Exam reveals no gallop and no friction rub.   No murmur heard. Pulmonary/Chest: Effort normal and breath sounds normal. No respiratory distress. He has no wheezes. He has no rales. He exhibits no tenderness.  Abdominal: He exhibits no distension and no mass. There is no tenderness. There is no rebound and no guarding.  Musculoskeletal: He exhibits no edema and no tenderness.  Lymphadenopathy:    He has no cervical adenopathy.  Neurological: He is alert and oriented to person, place, and time. He has normal reflexes. He exhibits normal muscle tone. Coordination normal.  Skin: Skin is warm and dry. He is not diaphoretic. No erythema. No pallor.  Psychiatric: He has a normal mood and affect. His behavior is normal. Judgment and thought content normal.          Assessment & Plan:  HIV: Continue complera with meal daily  He will consider MTX study ACTG 5314   DM: followed by Dr. Bosie Clos, and better controlled  Hyperlipidemia: on 80 of lipitor and LDL is at goal  Need for flu shot: has had flu vaccine

## 2013-09-10 ENCOUNTER — Other Ambulatory Visit: Payer: Self-pay | Admitting: Internal Medicine

## 2013-09-15 ENCOUNTER — Ambulatory Visit (INDEPENDENT_AMBULATORY_CARE_PROVIDER_SITE_OTHER): Payer: Self-pay | Admitting: Family Medicine

## 2013-09-15 DIAGNOSIS — E119 Type 2 diabetes mellitus without complications: Secondary | ICD-10-CM

## 2013-09-15 NOTE — Progress Notes (Signed)
Subjective:  Patient presents today for 3 month diabetes follow-up as part of the employer-sponsored Link to Wellness program. Current diabetes regimen includes Metformin 500 mg BID (patient hasn't been taking for the past few weeks secondary to GI side effects), Lantus 23 units once daily and Novolog 5 units SQ with meals. Patient also continues on daily ASA, ACEi, and statin.   Patient states that everything has been going pretty good since his last appointment.   He has gained 4 pounds since his last appointment with me. He reports that he has been eating more with the holidays and he isn't walking as much because it has been colder and the days have been shorter. He has been doing more yard work lately because his house has a lot of trees.  He reports that he has been having diarrhea 3-4 times daily. He stopped taking metformin and the diarrhea has improved. He was taking the IR metformin and hasn't tried the ER metformin. He is willing to try it. I will fax to Dr. Marge Duncans office to see if we can make the change.    Disease Assessments:  Diabetes:  Type of Diabetes: Type 2; Year of diagnosis 2010; MD managing Diabetes Dr. Kristie Cowman; Sees Diabetes provider 1 time a year; checks blood glucose 3-4 times a day; checks feet daily; uses glucometer; takes medications as prescribed; takes an aspirin a day; 7 day CBG average 118; 14 day CBG average 111; 30 day CBG average 112; Highest CBG 178; Lowest CBG 70; hypoglycemia frequency <1x/week; Other Diabetes History: He is checking his blood sugar 3-4 times daily. Fasting, before meals and then again at bedtime.  The majority of the readings on his meter are between 90-120.   A1C today was 6.1%.      Tobacco Assessment:  Smoking Status: Former smoker; Last Reviewed: 09/12/2013  smoked for 25 years; Date quit smoking: 07/03/2007    Physical Activity-   He has cut back on walking and is only getting out three times a week. He is walking for about  90 minutes for about 2.5 miles. He is also doing yard work.   Nutrition-  He reports that he is ","eating whatever I want",". He is no longer counting carbs. He admits that his eating has increased with the holiday season. He has been trying to follow the plate method at dinner. His partner was recently diagnosed with diabetes and they have both been trying to be better at dinner.       Preventive Care:    Hemoglobin A1c: 09/12/2013 6.1    Dilated Eye Exam: 07/14/2013  Flu vaccine: 06/09/2013  Foot Exam: 08/06/2012  Other Preventive Care Notes: Dental- September 2014       Overall Health Assessments:  Vision:  Dilated Eye Exam: 07/14/2013      Vital Signs:  09/12/2013 10:32 AM (EST) Blood Pressure 129 / 74 mm/HgBMI 27.4; Height 6 ft 2 in; Pulse Rate 70 bpm; Weight 213.6 lbs    Testing:  Blood Sugar Tests:  Hemoglobin A1c: 6.1 resulted on 09/12/2013        Assessment/Plan: Patient is a 52 year old male with DM2. A1C today was 6.1% and is meeting A1C goal of less than 6.5%. Fasting blood sugars continue to meet goal with 7, 14, 30 day averages all around 110-120.   Patient has gained some weight since his last appointment with me. He attributes this to decreased physical activity and also eating more over the holidays. Patient voiced that  he wants to lose weight and doesn't want to gain anymore. We discussed strategies to lose weight, including increased physical activity and limiting portion sizes.   Patient reports diarrhea 3-4 times daily. He is taking regular release metformin. I will send a fax to Dr. Marge Duncans office requesting a change to the ER metformin which is usually better tolerated and doesn't cause as much diarrhea. I will also fax A1C result to her office.   Follow up with patient in 3 months..    Goals for Next Visit-  1. Continue physical activity despite weather conditions. Try to get physical activity most days of the week (4-5 days a week).  2.  Increase vegetable intake- keep following the plate method.  3. For holiday treats- have a small sample and then move on.  Next appointment to see me is Friday March 13th at 10 AM.

## 2013-09-17 ENCOUNTER — Encounter: Payer: Self-pay | Admitting: Internal Medicine

## 2013-09-18 ENCOUNTER — Other Ambulatory Visit: Payer: Self-pay | Admitting: Internal Medicine

## 2013-09-19 ENCOUNTER — Encounter: Payer: Self-pay | Admitting: Internal Medicine

## 2013-09-29 ENCOUNTER — Telehealth: Payer: Self-pay | Admitting: *Deleted

## 2013-09-29 ENCOUNTER — Other Ambulatory Visit: Payer: Self-pay | Admitting: Internal Medicine

## 2013-09-29 DIAGNOSIS — E1140 Type 2 diabetes mellitus with diabetic neuropathy, unspecified: Secondary | ICD-10-CM

## 2013-09-29 MED ORDER — METFORMIN HCL ER (MOD) 500 MG PO TB24: 500.0000 mg | ORAL_TABLET | Freq: Every day | ORAL | Status: DC

## 2013-09-29 MED ORDER — INSULIN PEN NEEDLE 31G X 8 MM MISC: Status: DC

## 2013-09-29 NOTE — Telephone Encounter (Signed)
States Metformin is causing upset stomach; requesting something else. Also requesting pen needles- check directions.  Thanks

## 2013-09-29 NOTE — Telephone Encounter (Signed)
I have discontinued old metformin and prescribed Metformin Extended Release 500 mg qd. Hopefully this will help with the diarrhea.  I have also refilled pens.

## 2013-09-29 NOTE — Telephone Encounter (Signed)
Dr Bosie Clos can u fill rx for pen needles  Thanks

## 2013-09-30 ENCOUNTER — Other Ambulatory Visit: Payer: Self-pay | Admitting: Internal Medicine

## 2013-09-30 DIAGNOSIS — E1140 Type 2 diabetes mellitus with diabetic neuropathy, unspecified: Secondary | ICD-10-CM

## 2013-09-30 MED ORDER — METFORMIN HCL ER 500 MG PO TB24: 500.0000 mg | ORAL_TABLET | Freq: Every day | ORAL | Status: DC

## 2013-09-30 NOTE — Telephone Encounter (Signed)
I did.  Sorry that I left that fact out of my previous message.

## 2013-10-06 ENCOUNTER — Other Ambulatory Visit: Payer: Self-pay | Admitting: *Deleted

## 2013-10-06 MED ORDER — LANCETS 28G MISC: 1.0000 [IU] | Status: DC | PRN

## 2013-11-10 ENCOUNTER — Ambulatory Visit (INDEPENDENT_AMBULATORY_CARE_PROVIDER_SITE_OTHER): Payer: 59 | Admitting: Internal Medicine

## 2013-11-10 ENCOUNTER — Encounter: Payer: Self-pay | Admitting: Internal Medicine

## 2013-11-10 VITALS — BP 130/70 | HR 67 | Temp 98.2°F | Ht 74.0 in | Wt 218.1 lb

## 2013-11-10 DIAGNOSIS — L84 Corns and callosities: Secondary | ICD-10-CM

## 2013-11-10 DIAGNOSIS — I10 Essential (primary) hypertension: Secondary | ICD-10-CM

## 2013-11-10 DIAGNOSIS — E1142 Type 2 diabetes mellitus with diabetic polyneuropathy: Secondary | ICD-10-CM

## 2013-11-10 DIAGNOSIS — E1149 Type 2 diabetes mellitus with other diabetic neurological complication: Secondary | ICD-10-CM

## 2013-11-10 DIAGNOSIS — E1140 Type 2 diabetes mellitus with diabetic neuropathy, unspecified: Secondary | ICD-10-CM

## 2013-11-10 NOTE — Assessment & Plan Note (Signed)
Patient presents with foot pain due to corn on plantar surface of right foot.  We discussed that these are usually from pressure or friction in affected area.  Instructed patient to purchase corn cushions as well as new shoe inserts and avoid ill-fitting shoes.  He will let us know if these measures are not effective, and we will refer to sports medicine for orthotics if that is the case.

## 2013-11-10 NOTE — Assessment & Plan Note (Signed)
BP Readings from Last 3 Encounters:  11/10/13 130/70  09/02/13 122/76  08/15/13 133/73    Lab Results  Component Value Date   NA 139 08/19/2013   K 4.1 08/19/2013   CREATININE 1.08 08/19/2013    Assessment: Blood pressure control:  controlled Progress toward BP goal:   stable  Plan: Medications:  continue current medications

## 2013-11-10 NOTE — Assessment & Plan Note (Addendum)
Normal foot exam today (with exception of corn), patient has good feeling in his feet.

## 2013-11-10 NOTE — Progress Notes (Signed)
Patient ID: Trevor Fischer, male   DOB: 10/04/1960, 53 y.o.   MRN: [id number]   Subjective:   Patient ID: Trevor Fischer male   DOB: 17-Oct-1960 53 y.o.   MRN: [id number]  HPI: [name] is a 53 y.o. man with history of HIV, controlled DM2, HTN, HL who presents for right foot pain.   Patient states the ball of his right foot has been painful for approximately 3 weeks.  He has good feeling in his feet and does not remember injuring it but does spend a lot of time outdoors gardening and walking; he always wears shoes during these activities.  He has noticed that there is a small dark area with thickened skin where the foot is hurting.  He has been having trouble walking due to pain.  No redness, swelling, or drainage from the area, no fever.    Past Medical History  Diagnosis Date  . 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 Hyperlipidemia   . Degenerative joint disease of knee   . HIV infection   . Hypertension   . GERD (gastroesophageal reflux disease)   . Hx MRSA infection   . Allergic rhinitis   . Superficial thrombophlebitis   . Diabetes mellitus 9/09  . DVT (deep venous thrombosis)    Current Outpatient Prescriptions  Medication Sig Dispense Refill  . ACCU-CHEK AVIVA PLUS test strip USE AS INSTRUCTED  100 each  11  . ACCU-CHEK AVIVA PLUS test strip USE AS INSTRUCTED  100 each  PRN  . ASPIR-LOW 81 MG EC tablet TAKE 1 TABLET BY MOUTH DAILY.  100 tablet  3  . atorvastatin (LIPITOR) 80 MG tablet Take 1 tablet (80 mg total) by mouth daily.  90 tablet  4  . chlorpheniramine (CHLOR-TRIMETON) 4 MG tablet Take 4 mg by mouth every 6 (six) hours as needed for allergies.      . Clindamycin Phos-Benzoyl Perox (ACANYA) gel Apply 1 application topically 2 (two) times daily.      . Emtricitab-Rilpivir-Tenofovir 200-25-300 MG TABS Take 1 tablet by mouth daily.  90 tablet  4  . gabapentin (NEURONTIN) 300 MG capsule Take 1 capsule (300 mg total) by mouth 3 (three) times daily.   90 capsule  3  . insulin glargine (LANTUS SOLOSTAR) 100 UNIT/ML injection Inject 23 Units into the skin daily.  15 mL  11  . Insulin Pen Needle (UNIFINE PENTIPS) 31G X 8 MM MISC USE TO INJECT INSULIN 4 TIMES DAILY  100 each  6  . Lancets 28G MISC Inject 1 Units into the skin as needed. Use to test blood glucose 4 to 6 x daily  100 each  6  . lisinopril (PRINIVIL,ZESTRIL) 20 MG tablet TAKE 1 TABLET BY MOUTH ONCE DAILY  30 tablet  11  . Multiple Vitamins-Minerals (MULTIVITAMIN WITH MINERALS) tablet Take 1 tablet by mouth daily.      Marland Kitchen NOVOLOG FLEXPEN 100 UNIT/ML SOPN FlexPen INJECT 5 UNITS INTO THE SKIN 4 TIMES DAILY - BEFORE MEALS AND AT BEDTIME.  15 mL  12  . Omega-3 Fatty Acids (FISH OIL) 1000 MG CAPS Take 1 capsule by mouth 2 (two) times daily.       Marland Kitchen Tesamorelin Acetate 2 MG SOLR Inject 2 mg into the skin daily.  30 each  4  . tretinoin (RETIN-A) 0.025 % cream Apply topically at bedtime. Apply to the face every night at bedtime      . triamterene-hydrochlorothiazide (MAXZIDE) 75-50 MG per tablet TAKE 1 TABLET  BY MOUTH ONCE DAILY  45 tablet  4  . urea (CARMOL) 40 % CREA Apply topically 2 (two) times daily.      . verapamil (CALAN-SR) 240 MG CR tablet TAKE 1 TABLET BY MOUTH ONCE A DAY  90 tablet  PRN   No current facility-administered medications for this visit.   Family History  Problem Relation Age of Onset  . Diabetes Father   . Kidney disease Father   . Heart failure Father   . Hyperlipidemia Father   . Hypertension Father   . Osteoarthritis Mother    History   Social History  . Marital Status: Significant Other    Spouse Name: N/A    Number of Children: N/A  . Years of Education: N/A   Social History Main Topics  . Smoking status: Former Smoker -- 20 years    Types: Cigarettes    Quit date: 07/02/2005  . Smokeless tobacco: Never Used  . Alcohol Use: None  . Drug Use: No  . Sexual Activity: None     Comment: declined condoms   Other Topics Concern  . None   Social  History Narrative   Lives in [place], with partner of 62 years    Works as Chartered certified accountant at [employer]   Has [employer]   Review of Systems: Review of Systems  Constitutional: Negative for fever and chills.  Eyes: Negative for blurred vision.  Respiratory: Negative for cough and shortness of breath.   Cardiovascular: Negative for chest pain, palpitations and leg swelling.  Gastrointestinal: Negative for nausea, vomiting, abdominal pain, diarrhea, constipation and blood in stool.  Genitourinary: Negative for dysuria.  Musculoskeletal: Negative for falls and joint pain.  Neurological: Negative for dizziness, loss of consciousness, weakness and headaches.    Objective:  Physical Exam: Filed Vitals:   11/10/13 1420  BP: 130/70  Pulse: 67  Temp: 98.2 F (36.8 C)  TempSrc: Oral  Height: 6\' 2"  (1.88 m)  Weight: 218 lb 1.6 oz (98.93 kg)  SpO2: 96%   General: alert, cooperative, and in no apparent distress HEENT: NCAT, vision grossly intact, oropharynx clear and non-erythematous  Neck: supple, no lymphadenopathy Lungs: clear to ascultation bilaterally, normal work of respiration, no wheezes, rales, ronchi Heart: regular rate and rhythm, no murmurs, gallops, or rubs Abdomen: soft, non-tender, non-distended, normal bowel sounds Extremities: 2+ DP/PT pulses bilaterally, no cyanosis, clubbing, or edema Neurologic: alert & oriented X3, cranial nerves II-XII intact, strength grossly intact, sensation intact to light touch  Assessment & Plan:  Patient discussed with Dr. Lynnae January.  Please see problem-based assessment and plan.

## 2013-11-10 NOTE — Patient Instructions (Signed)
Follow-up as needed if the following treatments for your corn are not successful.  (If that is the case, we can refer you to have special shoes made and/or for a podiatry appointment.)  1. Purchase some corn cushions to apply to affected area (see print out). 2. Purchase some new shoe inserts to provide support to your feet and prevent friction and pressure.  3. Avoid ill-fitting shoes.   Keep up the good work taking all of your medicines! Below you will find further information on corns.   Corns and Calluses Corns are small areas of thickened skin that usually occur on the top, sides, or tip of a toe. They contain a cone-shaped core with a point that can press on a nerve below. This causes pain. Calluses are areas of thickened skin that usually develop on hands, fingers, palms, soles of the feet, and heels. These are areas that experience frequent friction or pressure. CAUSES  Corns are usually the result of rubbing (friction) or pressure from shoes that are too tight or do not fit properly. Calluses are caused by repeated friction and pressure on the affected areas. SYMPTOMS  A hard growth on the skin.  Pain or tenderness under the skin.  Sometimes, redness and swelling.  Increased discomfort while wearing tight-fitting shoes. DIAGNOSIS  Your caregiver can usually tell what the problem is by doing a physical exam. TREATMENT  Removing the cause of the friction or pressure is usually the only treatment needed. However, sometimes medicines can be used to help soften the hardened, thickened areas. These medicines include salicylic acid plasters and 12% ammonium lactate lotion. These medicines should only be used under the direction of your caregiver. HOME CARE INSTRUCTIONS   Try to remove pressure from the affected area.  You may wear donut-shaped corn pads to protect your skin.  You may use a pumice stone or nonmetallic nail file to gently reduce the thickness of a corn.  Wear  properly fitted footwear.  If you have calluses on the hands, wear gloves during activities that cause friction.  If you have diabetes, you should regularly examine your feet. Tell your caregiver if you notice any problems with your feet. SEEK IMMEDIATE MEDICAL CARE IF:   You have increased pain, swelling, redness, or warmth in the affected area.  Your corn or callus starts to drain fluid or bleeds.  You are not getting better, even with treatment. Document Released: 06/24/2004 Document Revised: 12/11/2011 Document Reviewed: 05/16/2011 [facility] Patient Information 2014 [place], [place].

## 2013-11-11 LAB — GLUCOSE, CAPILLARY: Glucose-Capillary: 126 mg/dL — ABNORMAL HIGH (ref 70–99)

## 2013-11-11 NOTE — Progress Notes (Signed)
I saw and evaluated the patient.  I personally confirmed the key portions of the history and exam documented by Dr. Rogers and I reviewed pertinent patient test results.  The assessment, diagnosis, and plan were formulated together and I agree with the documentation in the resident's note. 

## 2013-12-09 NOTE — Progress Notes (Signed)
Patient ID: Trevor Fischer, male   DOB: December 21, 1960, 53 y.o.   MRN: [id number] ATTENDING PHYSICIAN NOTE: I have reviewed the chart and agree with the plan as detailed above. Dorcas Mcmurray MD Pager [phone]

## 2013-12-09 NOTE — Progress Notes (Signed)
Patient ID: Audrey Curran, male   DOB: 11/11/1960, 52 y.o.   MRN: [id number] ATTENDING PHYSICIAN NOTE: I have reviewed the chart and agree with the plan as detailed above. Grayling Schranz MD Pager [phone]  

## 2013-12-11 ENCOUNTER — Other Ambulatory Visit: Payer: Self-pay | Admitting: Internal Medicine

## 2013-12-26 ENCOUNTER — Encounter: Payer: Self-pay | Admitting: Internal Medicine

## 2013-12-29 ENCOUNTER — Encounter: Payer: Self-pay | Admitting: Internal Medicine

## 2013-12-29 ENCOUNTER — Ambulatory Visit (INDEPENDENT_AMBULATORY_CARE_PROVIDER_SITE_OTHER): Payer: Self-pay | Admitting: Family Medicine

## 2013-12-29 VITALS — BP 124/71 | HR 59 | Ht 74.0 in | Wt 217.0 lb

## 2013-12-29 DIAGNOSIS — E119 Type 2 diabetes mellitus without complications: Secondary | ICD-10-CM

## 2013-12-29 NOTE — Progress Notes (Signed)
Subjective:   Patient presents today for 3 month diabetes follow-up as part of the employer-sponsored Link to Wellness program. Current diabetes regimen includes Lantus 23 units SQ once daily & Novolog 5 units SQ with meals. Patient also continues on daily ASA, ACEi, and statin.   Patient reports no major changes since his last appointment with me.   He stopped taking metformin because of the constant diarrhea. He was having difficulty controlling when he was having "squirts". This was about 2 months. He reports that his blood sugars have been higher since he stopped taking the metformin.   Last appointment with Dr. Michail Sermon was 2 months ago to evaluate a corn on his foot. He has no appointments pending with her.   Patient is seen today with his partner who is also active in the Link to [employer].    Disease Assessments:  Diabetes:  Type of Diabetes: Type 2; Year of diagnosis 2010; MD managing Diabetes Dr. Dorian Heckle; Sees Diabetes provider 1 time a year; checks blood glucose 3-4 times a day; checks feet daily; uses glucometer; takes medications as prescribed; takes an aspirin a day; hypoglycemia frequency <1x/week;   Highest CBG 242; Lowest CBG 57;   7 day CBG average 123; 14 day CBG average 113; 30 day CBG average 133; Other Diabetes History: He is checking his blood sugar 3-4 times daily. Fasting, before meals and then again at bedtime.  A1C has increased to 8.0%. This is likely secondary to stopping metformin. However, CBGS in the past few weeks have been improved, likely because patient has been exercising more. Recent month's averages do not appear to agree with A1C of 8%.     Physical Activity-   He states that he has increased yard work. He states that he is doing something every day- he reports he is out there ","all day",", usually 3-4 hours. He is walking some on top of this.   Nutrition-  He reports that he has shopping [place] [employer] and has been  getting more produce. He thinks he is eating more fruits and vegetables. He reports that he isn't couting carbs. He is usually doing 5 units Novolog with each meal. Larger carb meals he is giving more insulin.     Preventive Care:    Hemoglobin A1c: 12/26/2013 8.0    Dilated Eye Exam: 07/14/2013  Flu vaccine: 06/09/2013  Foot Exam: 11/10/2013  Other Preventive Care Notes: Dental- March 2015       Vital Signs:  12/26/2013 10:41 AM (EST) Blood Pressure 124 / 71 mm/HgBMI 27.9; Height 6 ft 2 in; Pulse Rate 59 bpm; Weight 217 lbs    Testing:  Blood Sugar Tests:  Hemoglobin A1c: 8.0 via POCT testing resulted on 12/26/2013      Assessment/Plan: Patient is a 53 year old male with DM2. A1C today was 8.0% and is above goal of less than 7%. This jump in A1C is likely secondary to patient stopping metformin 2 months ago. Patient has also gained 4 pounds since his last appointment with me. CBG averages from the last month are between 113-133 and are lower than I would have expected with an A1C of 8%. However, patient's physical activity has increased lately and he reports that CBGs immediately after stopping metformin were higher than they have been recently. I encouraged patient to call and make an appointment with Dr. Michail Sermon for potential insulin titration. I asked patient to start making notes in his CBG log for when CBGs are elevated.  If fasting is consistently elevated then Lantus needs to be increased. If pre-meal CBGs are elevated he likely needs to increase Novolog dose.   Patient agreed to increase physical activity over the summer.   I will send a copy of A1C results to Dr. Michail Sermon. Follow up with patient when I return from maternity leave..    Goals for Next Visit-  1. Make an appointment to see Dr. Michail Sermon.  2. Start counting carbohydrates again.  3. Start looking for patterns- when is your blood sugar high? If fasting is high, you may need more Lantus. If blood sugar is high  before meals, you may need for Novolog.  4. Increase physical activity. Start walking again. This will help bring your blood sugar down.  Next appointment to see me is Friday August 7th at 10 AM.

## 2014-01-06 ENCOUNTER — Other Ambulatory Visit: Payer: Self-pay | Admitting: Internal Medicine

## 2014-01-06 DIAGNOSIS — I10 Essential (primary) hypertension: Secondary | ICD-10-CM

## 2014-01-08 ENCOUNTER — Other Ambulatory Visit: Payer: Self-pay

## 2014-01-22 ENCOUNTER — Telehealth: Payer: Self-pay | Admitting: Dietician

## 2014-01-22 NOTE — Telephone Encounter (Signed)
Hi Trevor Fischer, I am not sure of the method of which you would like me to put in the order to program his carb ratio, correction factor and target blood sugar.  If possible please take this as a verbal order with the parameters you suggested in the note and I will cosign. Otherwise, I will address issue on Monday during clinic.  Thanks in advance.

## 2014-01-22 NOTE — Telephone Encounter (Signed)
Told patient his new meter is here- the accu chek aviva expert and that i need an order form his doctor to program his carb ratio, correction factor and target blood sugar range before I can give it to him Using a TDD of 46 units a day,  his carb ratio would be 500/46=1:11 His correction factor would be 1: 40 Suggest a target range of 80- 160

## 2014-01-23 ENCOUNTER — Other Ambulatory Visit: Payer: Self-pay | Admitting: Internal Medicine

## 2014-01-26 NOTE — Telephone Encounter (Signed)
Told patient that his Aviva expert has been set up and is ready for pick up. He made an appointment for 02/03/14 to be trained.

## 2014-02-02 ENCOUNTER — Other Ambulatory Visit: Payer: Self-pay | Admitting: Infectious Disease

## 2014-02-03 ENCOUNTER — Ambulatory Visit: Payer: 59 | Admitting: Dietician

## 2014-02-05 ENCOUNTER — Ambulatory Visit (INDEPENDENT_AMBULATORY_CARE_PROVIDER_SITE_OTHER): Payer: 59 | Admitting: Dietician

## 2014-02-05 VITALS — Wt 215.5 lb

## 2014-02-05 DIAGNOSIS — E1140 Type 2 diabetes mellitus with diabetic neuropathy, unspecified: Secondary | ICD-10-CM

## 2014-02-05 DIAGNOSIS — E1149 Type 2 diabetes mellitus with other diabetic neurological complication: Secondary | ICD-10-CM

## 2014-02-05 DIAGNOSIS — E1142 Type 2 diabetes mellitus with diabetic polyneuropathy: Secondary | ICD-10-CM

## 2014-02-05 NOTE — Progress Notes (Signed)
Diabetes Self-Management Education  Visit Number: Other (Comment) Last visit 09/2012 02/05/2014 [name], identified by name and date of birth, is a 53 y.o. male with Type 1 diabetes  .  Other people present during visit: husband, Trevor Fischer     ASSESSMENT  Patient Concerns:     Weight 215 lb 8 oz (97.75 kg).( Decreased 10 # since last visit) Body mass index is 27.66 kg/(m^2).  Lab Results: LDL Cholesterol  Date Value Ref Range Status  08/19/2013 80  0 - 99 mg/dL Final                 Hemoglobin A1C  Date Value Ref Range Status  03/03/2013 6.4   Final  06/12/2011 6.7* <5.7 % Final                                                                                Family History  Problem Relation Age of Onset  . Diabetes Father   . Kidney disease Father   . Heart failure Father   . Hyperlipidemia Father   . Hypertension Father   . Osteoarthritis Mother    History  Substance Use Topics  . Smoking status: Former Smoker -- 20 years    Types: Cigarettes    Quit date: 07/02/2005  . Smokeless tobacco: Never Used  . Alcohol Use: Not on file    Assessment comments: TDD is 43 units a day(23 basal and 20 bolus) meter settings: 6 time blocks set up according to patient's schedule- all have same settings right now: ICR is 1:12, Correction/sensitivity factor 1:42, target 70-130, meal rise is 80, snack size is 15 grams, acting time is 4 hours and offset time is 2 hours, exercise 1 is -3%, exercise 2 is -5%, stress is +3%, illness is +5%. Patient proficient at carb counting, taught back how to use meter successfully today   Diet Recall: eats 6 small meals a day, 8 am, noon, 6 PM, 9 Pm, 12am, 3- 4 am   Individualized Plan for Diabetes Self-Management Training:  Patient individualized diabetes plan discussed today with patient and includes:  Monitoring Education Topics Reviewed with Patient Today:  Topic Points Discussed  Disease State    Nutrition Management    Physical  Activity and Exercise    Medications    Monitoring Patient taught to use accu check aviva Expert meter to suggest appropriate bolus insulin.   Acute Complications    Chronic Complications    Psychosocial Adjustment    Goal Setting    Preconception Care (if applicable)      PATIENTS GOALS   Learning Objective(s):     Goal The patient agrees to:  Nutrition    Physical Activity    Medications    Monitoring  check blood sugar with new meter and enter carb grams and basal insulin in meter for tracking  Problem Solving    Reducing Risk    Health Coping     Patient Self-Evaluation of Goals (Subsequent Visits)  Goal The patient rates self as meeting goals (% of time)  Nutrition   >75%  Physical Activity    Medications >75%  Monitoring >75%  Problem Solving  Reducing Risk  >75%  Health Coping   >75%     PERSONALIZED PLAN / SUPPORT  Self-Management Support:  Doctor's office;Friends;Family;CDE visits ______________________________________________________________________  Outcomes  Expected Outcomes:    Self-Care Barriers:  None Education material provided: yes If problems or questions, patient to contact team via:  Phone and Email Time in: 1530     Time out: 1600 Future DSME appointment: - PRN   Trevor Fischer 02/05/2014 4:26 PM

## 2014-02-18 ENCOUNTER — Other Ambulatory Visit: Payer: 59

## 2014-02-25 ENCOUNTER — Other Ambulatory Visit: Payer: Self-pay | Admitting: Internal Medicine

## 2014-03-04 ENCOUNTER — Ambulatory Visit: Payer: 59 | Admitting: Infectious Disease

## 2014-03-04 ENCOUNTER — Ambulatory Visit (INDEPENDENT_AMBULATORY_CARE_PROVIDER_SITE_OTHER): Payer: 59 | Admitting: Internal Medicine

## 2014-03-04 ENCOUNTER — Encounter: Payer: Self-pay | Admitting: Internal Medicine

## 2014-03-04 VITALS — BP 113/64 | HR 66 | Temp 97.9°F | Resp 20 | Ht 74.0 in | Wt 207.4 lb

## 2014-03-04 DIAGNOSIS — B20 Human immunodeficiency virus [HIV] disease: Secondary | ICD-10-CM

## 2014-03-04 DIAGNOSIS — E1140 Type 2 diabetes mellitus with diabetic neuropathy, unspecified: Secondary | ICD-10-CM

## 2014-03-04 DIAGNOSIS — E1142 Type 2 diabetes mellitus with diabetic polyneuropathy: Secondary | ICD-10-CM

## 2014-03-04 DIAGNOSIS — E1149 Type 2 diabetes mellitus with other diabetic neurological complication: Secondary | ICD-10-CM

## 2014-03-04 DIAGNOSIS — I10 Essential (primary) hypertension: Secondary | ICD-10-CM

## 2014-03-04 LAB — GLUCOSE, CAPILLARY: Glucose-Capillary: 131 mg/dL — ABNORMAL HIGH (ref 70–99)

## 2014-03-04 LAB — POCT GLYCOSYLATED HEMOGLOBIN (HGB A1C): Hemoglobin A1C: 6.7

## 2014-03-04 NOTE — Progress Notes (Signed)
Attending physician note: Presenting problems, physical findings, medications, and management plan discussed with resident physician Dr. Eula Fried and I agree with her plan. Murriel Hopper, MD, [place]  Hematology-Oncology/Internal Medicine

## 2014-03-04 NOTE — Assessment & Plan Note (Signed)
BP Readings from Last 3 Encounters:  03/04/14 113/64  12/29/13 124/71  11/10/13 130/70   Lab Results  Component Value Date   NA 139 08/19/2013   K 4.1 08/19/2013   CREATININE 1.08 08/19/2013   Assessment: Blood pressure control: controlled Progress toward BP goal:  at goal  Plan: Medications:  continue current medications lisinopril 20mg  and maxzide 75-50mg , and verapamil 240mg 

## 2014-03-04 NOTE — Progress Notes (Signed)
Subjective:   Patient ID: Trevor Fischer male   DOB: 31-Oct-1960 53 y.o.   MRN: [id number]  HPI: [name] is a 53 y.o. male with PMH of HIV (CD4 620 08/2013), DM2 (well controlled), HTN, and HL who presents to opc today for diabetes follow up visit. His last A1C was 6.4 03/2013.  However, per patient email to pcp in March 2015, he reports his A1C to be 8 and he stopped taking metformin around that time due to GI side effects.  He was last seen by family medicine in the link to wellness program.  His current regimen is Lantus 23 units and Novolog on carb counting scale qid. We will recheck A1C today. Since his new meter and his carb counting, he reports tremendous improvement in his blood sugars and he is very happy with his management at this time.  He has no other complaints and is eager to see his A1C. On review of his blood sugars, he has one low cbg of 59, but otherwise average of 104.  Highest cbg was 246.  He is working on weight loss and is on a specific diet plan called rapid metabolic diet that has been working very well for him. His goal weight is 200, and today he is down to 207lb's.  He has cut down on his fast food and greasy food and makes more meals at home.   His BP is well controlled and he is compliant with his medications. Also takes a daily ASA and statin. He will follow up with Dr. Tommy Fischer next week.   Past Medical History  Diagnosis Date  . 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 Hyperlipidemia   . Degenerative joint disease of knee   . HIV infection   . Hypertension   . GERD (gastroesophageal reflux disease)   . Hx MRSA infection   . Allergic rhinitis   . Superficial thrombophlebitis   . Diabetes mellitus 9/09  . DVT (deep venous thrombosis)    Current Outpatient Prescriptions  Medication Sig Dispense Refill  . ACCU-CHEK AVIVA PLUS test strip USE AS INSTRUCTED  100 each  11  . ACCU-CHEK AVIVA PLUS test strip USE AS INSTRUCTED  100 each  PRN  .  ASPIR-LOW 81 MG EC tablet TAKE 1 TABLET BY MOUTH DAILY.  100 tablet  3  . atorvastatin (LIPITOR) 80 MG tablet Take 1 tablet (80 mg total) by mouth daily.  90 tablet  4  . chlorpheniramine (CHLOR-TRIMETON) 4 MG tablet Take 4 mg by mouth every 6 (six) hours as needed for allergies.      . Clindamycin Phos-Benzoyl Perox (ACANYA) gel Apply 1 application topically 2 (two) times daily.      . COMPLERA 200-25-300 MG TABS TAKE 1 TABLET BY MOUTH DAILY.  90 tablet  PRN  . gabapentin (NEURONTIN) 300 MG capsule TAKE 1 CAPSULE BY MOUTH 3 TIMES DAILY  90 capsule  PRN  . Lancets 28G MISC Inject 1 Units into the skin as needed. Use to test blood glucose 4 to 6 x daily  100 each  6  . LANTUS SOLOSTAR 100 UNIT/ML Solostar Pen INJECT 23 UNITS INTO THE SKIN DAILY.  15 mL  11  . lisinopril (PRINIVIL,ZESTRIL) 20 MG tablet Take 1 tablet (20 mg total) by mouth daily.  90 tablet  3  . Multiple Vitamins-Minerals (MULTIVITAMIN WITH MINERALS) tablet Take 1 tablet by mouth daily.      Marland Kitchen NOVOLOG FLEXPEN 100 UNIT/ML SOPN FlexPen INJECT  5 UNITS INTO THE SKIN 4 TIMES DAILY - BEFORE MEALS AND AT BEDTIME.  15 mL  12  . Omega-3 Fatty Acids (FISH OIL) 1000 MG CAPS Take 1 capsule by mouth 2 (two) times daily.       Marland Kitchen Tesamorelin Acetate 2 MG SOLR Inject 2 mg into the skin daily.  30 each  4  . tretinoin (RETIN-A) 0.025 % cream Apply topically at bedtime. Apply to the face every night at bedtime      . triamterene-hydrochlorothiazide (MAXZIDE) 75-50 MG per tablet TAKE 1 TABLET BY MOUTH ONCE DAILY  45 tablet  4  . UNIFINE PENTIPS 31G X 8 MM MISC USE TO INJECT INSULIN 4 TIMES DAILY  100 each  11  . urea (CARMOL) 40 % CREA Apply topically 2 (two) times daily.      . verapamil (CALAN-SR) 240 MG CR tablet TAKE 1 TABLET BY MOUTH ONCE A DAY  90 tablet  PRN   No current facility-administered medications for this visit.   Family History  Problem Relation Age of Onset  . Diabetes Father   . Kidney disease Father   . Heart failure Father     . Hyperlipidemia Father   . Hypertension Father   . Osteoarthritis Mother    History   Social History  . Marital Status: Significant Other    Spouse Name: N/A    Number of Children: N/A  . Years of Education: N/A   Social History Main Topics  . Smoking status: Former Smoker -- 20 years    Types: Cigarettes    Quit date: 07/02/2005  . Smokeless tobacco: Never Used  . Alcohol Use: None  . Drug Use: No  . Sexual Activity: None     Comment: declined condoms   Other Topics Concern  . None   Social History Narrative   Lives in [place], with partner of 40 years    Works as Chartered certified accountant at [employer]   Has [employer]   Review of Systems:  Constitutional:  Denies appetite change  HEENT:  Denies congestion, sore throat  Respiratory:  Denies SOB  Cardiovascular:  Denies chest pain   Gastrointestinal:  Denies nausea, vomiting, abdominal pain  Genitourinary:  Denies dysuria   Musculoskeletal:  Denies gait problem, occasional leg swelling.  Skin:  skin discoloration.   Neurological:  Denies seizures   Objective:  Physical Exam: Filed Vitals:   03/04/14 1400  BP: 113/64  Pulse: 66  Temp: 97.9 F (36.6 C)  TempSrc: Oral  Resp: 20  Height: 6\' 2"  (1.88 m)  Weight: 207 lb 6.4 oz (94.076 kg)  SpO2: 98%   Vitals reviewed. General: sitting in chair, NAD HEENT: EOMI wears glasses Cardiac: RRR Pulm: clear to auscultation bilaterally, no wheezes, rales, or rhonchi Abd: soft, nontender, nondistended, BS present Ext: warm and well perfused, no pedal edema Neuro: alert and oriented X3, strength and sensation to light touch equal in bilateral upper and lower extremities  Assessment & Plan:  Discussed with Trevor Fischer

## 2014-03-04 NOTE — Patient Instructions (Addendum)
General Instructions:  Dear [name],   Thank you for coming in today! Super proud of you on your weight control and diabetes. Your A1C is down to 6.7 today! Congratulations. We will make no changes today and keep working on your diet and blood sugar control as is.  Please bring your medicines with you each time you come to clinic.  Medicines may include prescription medications, over-the-counter medications, herbal remedies, eye drops, vitamins, or other pills. but thank you for bringing in your meter today!   Progress Toward Treatment Goals:  Treatment Goal 03/04/2014  Hemoglobin A1C at goal  Blood pressure at goal    Self Care Goals & Plans:  Self Care Goal 03/04/2014  Manage my medications take my medicines as prescribed; refill my medications on time  Monitor my health bring my glucose meter and log to each visit; keep track of my blood glucose  Eat healthy foods eat baked foods instead of fried foods; eat foods that are low in salt; eat more vegetables; eat fruit for snacks and desserts; drink diet soda or water instead of juice or soda  Be physically active find an activity I enjoy    Home Blood Glucose Monitoring 03/03/2013  Check my blood sugar 3 times a day     Care Management & Community Referrals:  Referral 08/15/2013  Referrals made for care management support none needed    Treatment Goals:  Goals (1 Years of Data) as of 03/04/14         As of Today 12/29/13 11/10/13 09/02/13 08/19/13     Blood Pressure    . Blood Pressure < 140/90  113/64 124/71 130/70 122/76      Result Component    . HEMOGLOBIN A1C < 7.0  6.7        . LDL CALC < 100      80      Progress Toward Treatment Goals:  Treatment Goal 03/04/2014  Hemoglobin A1C at goal  Blood pressure at goal    Self Care Goals & Plans:  Self Care Goal 03/04/2014  Manage my medications take my medicines as prescribed; refill my medications on time  Monitor my health bring my glucose meter and log to each visit;  keep track of my blood glucose  Eat healthy foods eat baked foods instead of fried foods; eat foods that are low in salt; eat more vegetables; eat fruit for snacks and desserts; drink diet soda or water instead of juice or soda  Be physically active find an activity I enjoy    Home Blood Glucose Monitoring 03/03/2013  Check my blood sugar 3 times a day     Care Management & Community Referrals:  Referral 08/15/2013  Referrals made for care management support none needed

## 2014-03-04 NOTE — Assessment & Plan Note (Signed)
Will follow with Dr. Tommy Medal next week

## 2014-03-04 NOTE — Assessment & Plan Note (Addendum)
Lab Results  Component Value Date   HGBA1C 6.7 03/04/2014   HGBA1C 6.4 03/03/2013   HGBA1C 7.6 08/19/2012    Assessment: Diabetes control: good control (HgbA1C at goal) Progress toward A1C goal:  at goal Comments: improved control with new meter and carb counting. A1C 6.7 today.   Plan: Medications:  continue current medications lantus 23 units and sliding scale novolog Home glucose monitoring: Frequency:   Timing:   Instruction/counseling given: reminded to get eye exam, reminded to bring blood glucose meter & log to each visit and reminded to bring medications to each visit Educational resources provided:   Self management tools provided: copy of home glucose meter download

## 2014-03-05 ENCOUNTER — Other Ambulatory Visit (INDEPENDENT_AMBULATORY_CARE_PROVIDER_SITE_OTHER): Payer: 59

## 2014-03-05 DIAGNOSIS — Z79899 Other long term (current) drug therapy: Secondary | ICD-10-CM

## 2014-03-05 DIAGNOSIS — Z113 Encounter for screening for infections with a predominantly sexual mode of transmission: Secondary | ICD-10-CM

## 2014-03-05 DIAGNOSIS — B20 Human immunodeficiency virus [HIV] disease: Secondary | ICD-10-CM

## 2014-03-05 LAB — CBC WITH DIFFERENTIAL/PLATELET
Basophils Absolute: 0.1 10*3/uL (ref 0.0–0.1)
Basophils Relative: 2 % — ABNORMAL HIGH (ref 0–1)
Eosinophils Absolute: 0.4 10*3/uL (ref 0.0–0.7)
Eosinophils Relative: 9 % — ABNORMAL HIGH (ref 0–5)
HEMATOCRIT: 41.3 % (ref 39.0–52.0)
Hemoglobin: 14.2 g/dL (ref 13.0–17.0)
LYMPHS ABS: 1.5 10*3/uL (ref 0.7–4.0)
Lymphocytes Relative: 37 % (ref 12–46)
MCH: 27.1 pg (ref 26.0–34.0)
MCHC: 34.4 g/dL (ref 30.0–36.0)
MCV: 78.8 fL (ref 78.0–100.0)
MONO ABS: 0.3 10*3/uL (ref 0.1–1.0)
MONOS PCT: 8 % (ref 3–12)
NEUTROS ABS: 1.8 10*3/uL (ref 1.7–7.7)
NEUTROS PCT: 44 % (ref 43–77)
Platelets: 232 10*3/uL (ref 150–400)
RBC: 5.24 MIL/uL (ref 4.22–5.81)
RDW: 14.2 % (ref 11.5–15.5)
WBC: 4 10*3/uL (ref 4.0–10.5)

## 2014-03-06 LAB — LIPID PANEL
CHOLESTEROL: 120 mg/dL (ref 0–200)
HDL: 29 mg/dL — ABNORMAL LOW (ref >39)
LDL Cholesterol: 78 mg/dL (ref 0–99)
Total CHOL/HDL Ratio: 4.1 Ratio
Triglycerides: 63 mg/dL (ref <150)
VLDL: 13 mg/dL (ref 0–40)

## 2014-03-06 LAB — COMPLETE METABOLIC PANEL WITH GFR
ALBUMIN: 4.2 g/dL (ref 3.5–5.2)
ALT: 44 U/L (ref 0–53)
AST: 34 U/L (ref 0–37)
Alkaline Phosphatase: 87 U/L (ref 39–117)
BUN: 24 mg/dL — AB (ref 6–23)
CALCIUM: 9.2 mg/dL (ref 8.4–10.5)
CO2: 28 mEq/L (ref 19–32)
Chloride: 102 mEq/L (ref 96–112)
Creat: 1.37 mg/dL — ABNORMAL HIGH (ref 0.50–1.35)
GFR, EST NON AFRICAN AMERICAN: 58 mL/min — AB
GFR, Est African American: 67 mL/min
GLUCOSE: 140 mg/dL — AB (ref 70–99)
Potassium: 4.5 mEq/L (ref 3.5–5.3)
Sodium: 136 mEq/L (ref 135–145)
Total Bilirubin: 0.5 mg/dL (ref 0.2–1.2)
Total Protein: 6.7 g/dL (ref 6.0–8.3)

## 2014-03-06 LAB — T-HELPER CELL (CD4) - (RCID CLINIC ONLY)
CD4 T CELL HELPER: 37 % (ref 33–55)
CD4 T Cell Abs: 540 /uL (ref 400–2700)

## 2014-03-06 LAB — HIV-1 RNA QUANT-NO REFLEX-BLD

## 2014-03-06 LAB — RPR

## 2014-03-06 LAB — MICROALBUMIN / CREATININE URINE RATIO
CREATININE, URINE: 134.7 mg/dL
Microalb Creat Ratio: 3.7 mg/g (ref 0.0–30.0)
Microalb, Ur: 0.5 mg/dL (ref 0.00–1.89)

## 2014-03-25 ENCOUNTER — Other Ambulatory Visit: Payer: Self-pay | Admitting: Internal Medicine

## 2014-03-30 ENCOUNTER — Ambulatory Visit (INDEPENDENT_AMBULATORY_CARE_PROVIDER_SITE_OTHER): Payer: 59 | Admitting: Infectious Disease

## 2014-03-30 ENCOUNTER — Encounter: Payer: Self-pay | Admitting: Infectious Disease

## 2014-03-30 VITALS — BP 127/70 | HR 71 | Temp 97.8°F | Wt 212.0 lb

## 2014-03-30 DIAGNOSIS — E109 Type 1 diabetes mellitus without complications: Secondary | ICD-10-CM

## 2014-03-30 DIAGNOSIS — E785 Hyperlipidemia, unspecified: Secondary | ICD-10-CM

## 2014-03-30 DIAGNOSIS — N289 Disorder of kidney and ureter, unspecified: Secondary | ICD-10-CM | POA: Insufficient documentation

## 2014-03-30 DIAGNOSIS — B20 Human immunodeficiency virus [HIV] disease: Secondary | ICD-10-CM

## 2014-03-30 LAB — PHOSPHORUS: PHOSPHORUS: 3.3 mg/dL (ref 2.3–4.6)

## 2014-03-30 LAB — BASIC METABOLIC PANEL WITH GFR
BUN: 23 mg/dL (ref 6–23)
CHLORIDE: 102 meq/L (ref 96–112)
CO2: 29 meq/L (ref 19–32)
Calcium: 9 mg/dL (ref 8.4–10.5)
Creat: 1.13 mg/dL (ref 0.50–1.35)
GFR, EST NON AFRICAN AMERICAN: 73 mL/min
GFR, Est African American: 85 mL/min
Glucose, Bld: 87 mg/dL (ref 70–99)
POTASSIUM: 4.1 meq/L (ref 3.5–5.3)
Sodium: 139 mEq/L (ref 135–145)

## 2014-03-30 LAB — URINALYSIS, ROUTINE W REFLEX MICROSCOPIC
Bilirubin Urine: NEGATIVE
Glucose, UA: NEGATIVE mg/dL
Hgb urine dipstick: NEGATIVE
KETONES UR: NEGATIVE mg/dL
Leukocytes, UA: NEGATIVE
Nitrite: NEGATIVE
Protein, ur: NEGATIVE mg/dL
SPECIFIC GRAVITY, URINE: 1.022 (ref 1.005–1.030)
UROBILINOGEN UA: 0.2 mg/dL (ref 0.0–1.0)
pH: 6.5 (ref 5.0–8.0)

## 2014-03-30 LAB — CREATININE, URINE, RANDOM: Creatinine, Urine: 170.3 mg/dL

## 2014-03-30 LAB — SODIUM, URINE, RANDOM: SODIUM UR: 199 meq/L

## 2014-03-30 NOTE — Progress Notes (Signed)
  Subjective:    Patient ID: Trevor Fischer, male    DOB: 23-Feb-1961, 53 y.o.   MRN: [id number]  HPI  Trevor Fischer is a 53 y.o. male who is doing superbly well on his  antiviral regimen complera with undetectable viral load and health cd4 count.   Both he and his husband are here today again.  He had already reviewed recent labs and had also noted the elevation in his serum creatinine. He did not know of any episodes of diarrhea, vomiting or other cause for deyhdration. He is on HCZT triamterene and ACEI. He has been on chronic TNF in his complera and in [place] before that. He has had some nocturia  Review of Systems  Constitutional: Negative for fever, chills, diaphoresis, activity change, appetite change, fatigue and unexpected weight change.  HENT: Negative for congestion, rhinorrhea, sinus pressure, sneezing, sore throat and trouble swallowing.   Eyes: Negative for photophobia and visual disturbance.  Respiratory: Negative for cough, chest tightness, shortness of breath, wheezing and stridor.   Cardiovascular: Negative for palpitations and leg swelling.  Gastrointestinal: Negative for nausea, vomiting, abdominal pain, diarrhea, constipation, blood in stool, abdominal distention and anal bleeding.  Endocrine: Negative for heat intolerance, polydipsia and polyuria.  Genitourinary: Positive for decreased urine volume. Negative for dysuria, hematuria, flank pain and difficulty urinating.  Musculoskeletal: Negative for gait problem, joint swelling and myalgias.  Skin: Negative for color change, pallor, rash and wound.  Neurological: Negative for dizziness, tremors, weakness and light-headedness.  Hematological: Negative for adenopathy. Does not bruise/bleed easily.  Psychiatric/Behavioral: Negative for behavioral problems, confusion, sleep disturbance, dysphoric mood, decreased concentration and agitation.       Objective:   Physical Exam  Constitutional: He is oriented to  person, place, and time. He appears well-developed and well-nourished. No distress.  HENT:  Head: Normocephalic and atraumatic.  Mouth/Throat: Oropharynx is clear and moist. No oropharyngeal exudate.  Eyes: Conjunctivae and EOM are normal. Pupils are equal, round, and reactive to light. No scleral icterus.  Neck: Normal range of motion. Neck supple. No JVD present.  Cardiovascular: Normal rate, regular rhythm and normal heart sounds.  Exam reveals no gallop and no friction rub.   No murmur heard. Pulmonary/Chest: Effort normal and breath sounds normal. No respiratory distress. He has no wheezes. He has no rales. He exhibits no tenderness.  Abdominal: He exhibits no distension and no mass. There is no tenderness. There is no rebound and no guarding.  Musculoskeletal: He exhibits no edema and no tenderness.  Lymphadenopathy:    He has no cervical adenopathy.  Neurological: He is alert and oriented to person, place, and time. He has normal reflexes. He exhibits normal muscle tone. Coordination normal.  Skin: Skin is warm and dry. He is not diaphoretic. No erythema. No pallor.  Psychiatric: He has a normal mood and affect. His behavior is normal. Judgment and thought content normal.          Assessment & Plan:   Acute worsening renal insuffiency: recheck bmp w gfr, phosphorus, UA I spent greater than 25 minutes with the patient including greater than 50% of time in face to face counsel of the patient and in coordination of their care.  HIV: Continue complera with meal daily   DM: followed by Dr. Michail Sermon, and better controlled  Hyperlipidemia: on 80 of lipitor and LDL is at goal

## 2014-04-01 ENCOUNTER — Other Ambulatory Visit: Payer: Self-pay | Admitting: Infectious Disease

## 2014-04-15 ENCOUNTER — Encounter: Payer: Self-pay | Admitting: Dietician

## 2014-05-04 ENCOUNTER — Encounter: Payer: Self-pay | Admitting: Family Medicine

## 2014-05-04 NOTE — Progress Notes (Signed)
Patient ID: Trevor Fischer, male   DOB: 01-17-1961, 53 y.o.   MRN: [id number] Reviewed: Agree with our Pharmacologist's documentation and management.

## 2014-05-08 ENCOUNTER — Ambulatory Visit (INDEPENDENT_AMBULATORY_CARE_PROVIDER_SITE_OTHER): Payer: Self-pay | Admitting: Family Medicine

## 2014-05-08 VITALS — BP 122/86 | Ht 74.0 in | Wt 214.0 lb

## 2014-05-08 DIAGNOSIS — E119 Type 2 diabetes mellitus without complications: Secondary | ICD-10-CM

## 2014-05-08 NOTE — Progress Notes (Signed)
Subjective:  Patient presents today for 3 month diabetes follow-up as part of the employer-sponsored Link to Wellness program. Current diabetes regimen includes Lantus 23 units SQ daily, Novolog 5 units SQ before meals and at bedtime. Patient also continues on daily ASA, ACEi, and statin. Last visit with PCP was in June. A1C at that time 6.7. No changes were made. He was previously seeing a resident with the [place] at [facility]. She has graduated from the program and is no longer at the center. He will need to get a new PCP. He has been doing the rapid metabolic diet. He has cut out dairy, cheese. He is avoiding white foods and eating whole grains. Although he states that he has been eating more desserts lately and hasn't been as strict with the diet. Scr was elevated at the beginning of June. Recheck several weeks later showed SCR WNL.   Disease Assessments:  Diabetes: Type of Diabetes: Type 2; Year of diagnosis 2010; MD managing Diabetes Dr. Dorian Heckle; Sees Diabetes provider 1 time a year;   checks blood glucose 3-4 times a day; checks feet daily; uses glucometer; takes medications as prescribed; takes an aspirin a day; hypoglycemia frequency <1x/week;   Highest CBG 168; Lowest CBG 67; 7 day CBG average 112; 14 day CBG average 113; 30 day CBG average 110; Other Diabetes History:  He is checking CBG four times daily. He has a new CBG meter that Accu Chek Aviva plus expert. He states that this has been helpful to titrate insulin doses. He is checking several times each day. For a while he was going low a few times a week. This hasn't been an issue in the past few weeks. He states that it is usually on days that he is not eating as many carbohydrates (2 days a week) with the new diet. Highest CBG is lower this time as compared to his last visit with me. 7,14, 30 day averages are about 10 points better than last time.   Tobacco Assessment: Smoking Status: Former smoker; Last Reviewed:  05/08/2014  smoked for 25 years; Date quit smoking: 07/03/2007   Social History:   Medication adherence adherent; Patient can afford medications; Patient knows the purpose/use of medications; Exercise adherence 1-2 days a week Exercise consists of walking, cutting grass; 45 minutes of exercise per week.  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 He has been doing yard work. He estimates that he is getting out at least once a week.  Nutrition- He states that he is counting carbs more consistently now because the new meter that he is using requires him to enter his carbohydrates to calculate insulin dose. He has lost 3 pounds since his last appointment with me. He estiamtes that he is trying to stay between 75-85 grams of carbs with each meal. He is following the rapid metabolism diet. Some days during the week he is not eating as many carbohydrates and sometimes he is going low on his blood sugar. Those days he isn't taking as much Novolog.  He states that he has been eating more fruits and vegetables.    Family History:  There is a family history of Diabetes noted in father.   Preventive Care:      Dilated Eye Exam: 07/14/2013  Flu vaccine: 06/09/2013  Foot Exam: 11/10/2013  Other Preventive Care Notes:  Dental- august 2015      Vital Signs:  05/08/2014 10:20  AM (EST)Blood Pressure 122 / 86 mm/HgBMI 27.5; Height 6 ft 2 in; Weight 214 lbs   Testing:  Blood Sugar Tests: Hemoglobin A1c: 6.7 via epic resulted on 03/05/2014              Assessment/Plan: Patient is a 53 year old male with DM2. Most recent A1C with the internal medicine clinic was 6.7% and is meeting goal of less than 7%. This is an improvement from the last A1C with [name] which was 8% in March. Patient states that he has been using his new AccuChek Aviva expert meter and this has helped him fine tune his meal time insulin dosage. He has also been  trying a new diet (Rapid Metabolism Diet) that has limited his carbohydrate intake. He was having issues with hypoglycemia but that was really only when he was on the low carb days of the diet. He hasn't been doing those days as often lately and the low blood sugar hasn't been a problem. I cautioned him to monitor CBGs on the days when his carbohydrate intake is lower as he may not need as many units of Novolog. If hypoglycemia persists on those days he may need to drop Lantus by 1-2 units. Patient has not been engaging in physical activity as much as he was previously because of the heat. I encouraged him to increase physical activity now that the weather is cooling down somewhat. Follow up with patient in 3 months. Follow up to see if he has a new PCP at that visit.   Teaching Goals:  Diabetes Mellitus: Other  Goals for Next Time- 1. Increase physical activity. Aim to walk first thing in the morning after you get off work. Goal is every other day for 40 minutes. 2. Continue making healthy eating choices. 3. On the days that you don't eat as many carbohydrates watch for low blood sugars. You may need to back down on Lantus dose by1-2 units to avoid hypoglycemia. Next visit to see me is Friday November 13th at 10 AM.

## 2014-05-11 ENCOUNTER — Encounter: Payer: Self-pay | Admitting: *Deleted

## 2014-06-23 ENCOUNTER — Telehealth: Payer: Self-pay | Admitting: Dietician

## 2014-06-23 NOTE — Telephone Encounter (Signed)
Wants to get A1C done next week when he comes to our office with his husband. He agreed to schedule an appointment with his new doctor on that day.

## 2014-06-24 ENCOUNTER — Other Ambulatory Visit: Payer: Self-pay | Admitting: *Deleted

## 2014-06-24 MED ORDER — ASPIRIN 81 MG PO TBEC: 81.0000 mg | DELAYED_RELEASE_TABLET | Freq: Every day | ORAL | Status: DC

## 2014-07-01 ENCOUNTER — Other Ambulatory Visit (INDEPENDENT_AMBULATORY_CARE_PROVIDER_SITE_OTHER): Payer: 59

## 2014-07-01 ENCOUNTER — Other Ambulatory Visit: Payer: Self-pay | Admitting: Dietician

## 2014-07-01 ENCOUNTER — Encounter: Payer: 59 | Admitting: Dietician

## 2014-07-01 DIAGNOSIS — E1149 Type 2 diabetes mellitus with other diabetic neurological complication: Secondary | ICD-10-CM

## 2014-07-01 DIAGNOSIS — E1140 Type 2 diabetes mellitus with diabetic neuropathy, unspecified: Secondary | ICD-10-CM

## 2014-07-01 LAB — GLUCOSE, CAPILLARY: Glucose-Capillary: 115 mg/dL — ABNORMAL HIGH (ref 70–99)

## 2014-07-01 LAB — POCT GLYCOSYLATED HEMOGLOBIN (HGB A1C): HEMOGLOBIN A1C: 6.6

## 2014-07-01 NOTE — Addendum Note (Signed)
Addended by: Truddie Crumble on: 07/01/2014 11:14 AM   Modules accepted: Orders

## 2014-07-01 NOTE — Progress Notes (Signed)
Patient requests A1C be done today and agrees to schedule an appointment with his new PCP, Dr. Raelene Bott. Discussed with Dr. Raelene Bott.

## 2014-07-10 ENCOUNTER — Telehealth: Payer: Self-pay | Admitting: *Deleted

## 2014-07-10 NOTE — Telephone Encounter (Signed)
Agree that he needs seen today. If he refuses, [facility] appt

## 2014-07-10 NOTE — Telephone Encounter (Signed)
Patient called, reporting 2 weeks of bloody stools, diarrhea.  Per patient, it is bright red, does not fill the toilet bowl, appears as streaks around his stool.  Patient denies any shortness of breath, light headedness, tachycardia.  Patient feels he is not in danger at this time, is just looking for advice.  RN advised patient to contact his primary care physician, as we do not have a doctor in clinic this morning.  He agreed.  Landis Gandy, RN

## 2014-07-10 NOTE — Telephone Encounter (Signed)
Pt calls and states he has rectal bleeding and diarrhea x 3 weeks, he has at least 5 urges for bm daily with 2 being completely blood and 3 mixed w/ loose stool. He denies h/a, chest pain, shortness of breath, abd pain, weakness, dizziness. He would like to be seen Tuesday 10/13 and is encouraged to go to ED now but refuses, states he does not feel this is emergent and will wait until Tuesday. He is ask to call 911 or have someone drive him to ED if his condition changes, he is agreeable

## 2014-07-14 ENCOUNTER — Encounter: Payer: Self-pay | Admitting: Internal Medicine

## 2014-07-14 ENCOUNTER — Ambulatory Visit (INDEPENDENT_AMBULATORY_CARE_PROVIDER_SITE_OTHER): Payer: 59 | Admitting: Internal Medicine

## 2014-07-14 VITALS — BP 137/82 | HR 60 | Temp 97.0°F | Ht 74.0 in | Wt 216.2 lb

## 2014-07-14 DIAGNOSIS — K625 Hemorrhage of anus and rectum: Secondary | ICD-10-CM | POA: Insufficient documentation

## 2014-07-14 DIAGNOSIS — I10 Essential (primary) hypertension: Secondary | ICD-10-CM

## 2014-07-14 LAB — PROTIME-INR
INR: 1.07 (ref <1.50)
Prothrombin Time: 13.9 seconds (ref 11.6–15.2)

## 2014-07-14 LAB — CBC WITH DIFFERENTIAL/PLATELET
BASOS PCT: 1 % (ref 0–1)
Basophils Absolute: 0.1 10*3/uL (ref 0.0–0.1)
Eosinophils Absolute: 0.8 10*3/uL — ABNORMAL HIGH (ref 0.0–0.7)
Eosinophils Relative: 14 % — ABNORMAL HIGH (ref 0–5)
HCT: 41 % (ref 39.0–52.0)
Hemoglobin: 13.9 g/dL (ref 13.0–17.0)
Lymphocytes Relative: 30 % (ref 12–46)
Lymphs Abs: 1.6 10*3/uL (ref 0.7–4.0)
MCH: 27.9 pg (ref 26.0–34.0)
MCHC: 33.9 g/dL (ref 30.0–36.0)
MCV: 82.2 fL (ref 78.0–100.0)
Monocytes Absolute: 0.5 10*3/uL (ref 0.1–1.0)
Monocytes Relative: 10 % (ref 3–12)
NEUTROS PCT: 45 % (ref 43–77)
Neutro Abs: 2.4 10*3/uL (ref 1.7–7.7)
PLATELETS: 237 10*3/uL (ref 150–400)
RBC: 4.99 MIL/uL (ref 4.22–5.81)
RDW: 13.4 % (ref 11.5–15.5)
WBC: 5.4 10*3/uL (ref 4.0–10.5)

## 2014-07-14 LAB — COMPLETE METABOLIC PANEL WITH GFR
ALT: 67 U/L — AB (ref 0–53)
AST: 57 U/L — ABNORMAL HIGH (ref 0–37)
Albumin: 4 g/dL (ref 3.5–5.2)
Alkaline Phosphatase: 117 U/L (ref 39–117)
BUN: 21 mg/dL (ref 6–23)
CHLORIDE: 102 meq/L (ref 96–112)
CO2: 26 mEq/L (ref 19–32)
Calcium: 9.2 mg/dL (ref 8.4–10.5)
Creat: 1.05 mg/dL (ref 0.50–1.35)
GFR, Est African American: >89 mL/min
GFR, Est Non African American: 80 mL/min
Glucose, Bld: 118 mg/dL — ABNORMAL HIGH (ref 70–99)
Potassium: 4.4 mEq/L (ref 3.5–5.3)
Sodium: 138 mEq/L (ref 135–145)
Total Bilirubin: 0.3 mg/dL (ref 0.3–1.2)
Total Protein: 7.3 g/dL (ref 6.0–8.3)

## 2014-07-14 LAB — LIPASE: LIPASE: 67 U/L — AB (ref 11–59)

## 2014-07-14 NOTE — Assessment & Plan Note (Addendum)
Assessment: Most likely diagnosis is a internal hemorrhoids bleeding versus hemorrhagic enteritis. Most likely his bleeding is lower GI. Ddx: I do not suspect malignancy given the duration of his rectal bleeding and a normal screening colonoscopy 4 years ago per the patient. He is hemodynamically stable. FOBT was performed, but the rectum was empty, which made it difficult to determine whether he actually has bloody stools.  Plan: 1. Labs/imaging: CBC with diff, CMP, lipase and INR. 2. Therapy: Referral to GI, encouraged to hold ASA until the etiology of his bleeding is determine. 3. Follow up: As needed for persistent symptoms or if they worsen.

## 2014-07-14 NOTE — Patient Instructions (Addendum)
General Instructions: We will check some basic blood work today  Please hold Aspirin until we have have figured out the source of your bleeding I will refer your to gastroenterology  I will call you with results Otherwise please folllow up here as needed or in 2-3 weeks with your regular doctor  Please bring your medicines with you each time you come to clinic.  Medicines may include prescription medications, over-the-counter medications, herbal remedies, eye drops, vitamins, or other pills.   Progress Toward Treatment Goals:  Treatment Goal 07/14/2014  Hemoglobin A1C at goal  Blood pressure at goal    Self Care Goals & Plans:  Self Care Goal 07/14/2014  Manage my medications take my medicines as prescribed; bring my medications to every visit; refill my medications on time  Monitor my health -  Eat healthy foods drink diet soda or water instead of juice or soda; eat more vegetables; eat foods that are low in salt; eat baked foods instead of fried foods; eat fruit for snacks and desserts  Be physically active -    Home Blood Glucose Monitoring 07/14/2014  Check my blood sugar once a day  When to check my blood sugar before breakfast     Care Management & Community Referrals:  Referral 07/14/2014  Referrals made for care management support none needed

## 2014-07-14 NOTE — Progress Notes (Signed)
Patient ID: Trevor Fischer, male   DOB: 08-22-1961, 53 y.o.   MRN: [id number]   Subjective:   HPI: [name] is a 53 y.o. man with PMH of HIV (CD4 620 08/2013), DM2 (well controlled), HTN, and HL   Reason(s) for this visit: 1. Rectal bleeding: Patient reports a 6 weeks history of rectal bleeding. He gets several bowel movements a day of mostly loose stools, with blood. He has sometimes noticed clots on top of the stool. The symptoms have been nonprogressive. The rectal bleeding is painless. He has no history of melena. He has no history of hematemesis. This is the first episode. He denies palpitations, lightheadedness, or shortness of breath chest pain. No associated fevers, increased fatigue, nausea, or vomiting. His appetite has been normal. He has no history of weight loss. His states that last screening colonoscopy was 4 years ago was normal. Since one day ago, he reports history of feeling diffuse abdominal pain, which was mild in nature. He has no history of constipation, and he denies hemorrhoids. Due to ongoing bleeding, patient has stopped using his aspirin a couple weeks ago. He has no history of is a bruising on control bleeding from any other orifices. No hematuria. Patient is a Psychologist, counselling and works night shifts. He is gay but he reports that he has not had sexual intercourse in 4-5 years.    ROS: Constitutional: Denies fever, chills, diaphoresis, appetite change and fatigue.  Respiratory: Denies SOB, DOE, cough, chest tightness, and wheezing. Denies chest pain. CVS: No chest pain, palpitations and leg swelling.  GU: No dysuria, frequency, or flank pain.  MSK: No myalgias, back pain, joint swelling, arthralgias  Psych: No depression symptoms. No SI or SA.    Objective:  Physical Exam: Filed Vitals:   07/14/14 1013 07/14/14 1015  BP: 145/73 137/82  Pulse: 60   Temp: 97 F (36.1 C)   TempSrc: Oral   Height: 6\' 2"  (1.88 m)   Weight: 216 lb 3.2 oz (98.068  kg)   SpO2: 100%    General: pleasant. Well nourished. No acute distress.  HEENT: Normal oral mucosa. MMM.  Lungs: CTA bilaterally. Heart: RRR; no extra sounds or murmurs  Abdomen: Non-distended, normal bowel sounds, soft, nontender to palpation; no hepatosplenomegaly: Rectal exam: Normal external hemorrhoids or anal tags. Anal verge appears normal with good sphincter tone. Digital examination does not reveal any rectal masses. The rectal vault is empty and clean. No stool on examining finger. (Patient says that he had a "thorough rectal clean out" just before his clinic appt.) FOBT negative >> yield is low with clean and empty rectum. Extremities: No pedal edema. No joint swelling or tenderness. Neurologic: Normal EOM,  Alert and oriented x3. No obvious neurologic/cranial nerve deficits.  Assessment & Plan:  Discussed case with my attending in the clinic, Dr. Daryll Drown.  See problem based charting.

## 2014-07-14 NOTE — Assessment & Plan Note (Signed)
BP Readings from Last 3 Encounters:  07/14/14 137/82  05/08/14 122/86  03/30/14 127/70    Lab Results  Component Value Date   NA 138 07/14/2014   K 4.4 07/14/2014   CREATININE 1.05 07/14/2014    Assessment: Blood pressure control: controlled Progress toward BP goal:  at goal Comments: On lisinopril, 20 mg daily, and verapamil 240 mg daily.  Plan: Medications:  continue current medications Educational resources provided:   Self management tools provided:   Other plans: Return to followup with PCP

## 2014-07-16 ENCOUNTER — Other Ambulatory Visit: Payer: Self-pay | Admitting: *Deleted

## 2014-07-16 MED ORDER — ACCU-CHEK MULTICLIX LANCETS MISC: Status: DC

## 2014-07-17 LAB — HM DIABETES EYE EXAM

## 2014-07-19 ENCOUNTER — Encounter: Payer: Self-pay | Admitting: Internal Medicine

## 2014-07-23 ENCOUNTER — Encounter: Payer: Self-pay | Admitting: *Deleted

## 2014-07-25 NOTE — Progress Notes (Signed)
Internal Medicine Clinic Attending  Case discussed with Dr. Kazibwe soon after the resident saw the patient.  We reviewed the resident's history and exam and pertinent patient test results.  I agree with the assessment, diagnosis, and plan of care documented in the resident's note. 

## 2014-07-29 ENCOUNTER — Encounter: Payer: Self-pay | Admitting: Family Medicine

## 2014-07-29 NOTE — Progress Notes (Signed)
Patient ID: Trevor Fischer, male   DOB: 12/18/1960, 53 y.o.   MRN: [id number] Reviewed: Agree with the documentation and management of our [place] Pharmacologist. 

## 2014-08-05 ENCOUNTER — Other Ambulatory Visit: Payer: Self-pay | Admitting: Gastroenterology

## 2014-08-14 ENCOUNTER — Ambulatory Visit (INDEPENDENT_AMBULATORY_CARE_PROVIDER_SITE_OTHER): Payer: Self-pay | Admitting: Family Medicine

## 2014-08-14 VITALS — BP 108/70 | Wt 213.6 lb

## 2014-08-14 DIAGNOSIS — E118 Type 2 diabetes mellitus with unspecified complications: Secondary | ICD-10-CM

## 2014-08-14 NOTE — Assessment & Plan Note (Signed)
Subjective:  Patient presents today for 3 month diabetes follow-up as part of the employer-sponsored [employer] to Well[employer] program. Current diabetes regimen includes Novolog 5 units before meals and at bedtime, Lantus 23 units SQ once daily. Patient also continues on daily ACEi, and statin.   Patient had a colonoscopy last week. ASA was d/c due to GI bleeding. He hasn't heard the results of the colonoscopy- he was told that he either has Crohn's disease or Colitis. Since he stopped ASA the rectal bleeding has improved. He was having bright red blood in the stools previously.  He has changed PCP providers. He will see Dr. Raelene Bott next week for the first time. He will have labwork done at this appointment so I will defer A1C testing to that visit. Last A1C was 07/01/14, 6.6%.   Disease Assessments:  Diabetes: Type of Diabetes: Type 2; Year of diagnosis 2010; MD managing Diabetes Dr. Dorian Heckle; Sees Diabetes provider 1 time a year;  checks blood glucose 3-4 times a day; checks feet daily; uses glucometer; takes medications as prescribed; takes an aspirin a day; hypoglycemia frequency rarely;   Highest CBG 198; Lowest CBG 83; 7 day CBG average 125; 14 day CBG average 120; 30 day CBG average 123;   Other Diabetes History:   Checking CBGs four times daily. He states that he is not having as many issue with hypoglycemia lately. Most recent A1C was 6.6%. Weight is stable since last visit. Averages are about 10 points higher since his last visit with me, but are still within goal range.   Social History:  Caffeine use: coffee tea1-2 cups coffee/wk; Alcohol use: 1 - 2 drinks per year; Medication adherence adherent; Patient can afford medications; Patient knows the purpose/use of medications; 45 minutes of exercise per week; Exercise adherence 3-4 days a week; Diet adherence 50-75% of the time.  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 He  states that he is getting out several days during the week for yard work. He estimates that he is walking for almost 2 hours a day with work (working as a Chartered certified accountant). He has a new app on his phone that tracks his steps.  Nutrition- Since Halloween patient states that things have been worse because he is eating more candy. He ate an entire pumpkin pie this past week over the course of a few days.  He is still counting carbs with meals, aiming for 80-100 carbs with meals, but patient admits that he has been going over. He does most of the cooking at home. He states that he wants to make better choices over the Holidays.    Preventive Care:      Colonoscopy: 08/07/2014  Dilated Eye Exam: 07/02/2014  Flu vaccine: 06/02/2014  Foot Exam: 11/10/2013  Other Preventive Care Notes:  Dental- august 2015      Overall Health Assessments:  Vision:  Dilated Eye Exam: 07/02/2014   Vital Signs:  08/14/2014 11:48 AM (EST)Blood Pressure 108 / 70 mm/Hg; Height 6 ft 2 in; Weight 213.6 lbs          Care Planning:  Learning Preference Assessment:  Learner: Patient  Readiness to Learn Barriers: None  Teaching Method: Explanation  Evaluation of Learning: Can function independently and verbalize knowledge  Readiness to Change:  How important is your health to you? 10  How confident are you in working to improve your health? 10  How ready are you to change to  improve your health? 10  Total Score: 10  Care Plan:  08/14/2014 11:49 AM (EST) (3)  Problem: Inadequate consumption of fruits and vegetabels  Role: Clinical Pharmacist  Long Term Goal Increase fruit and vegetable intake to at least 1 daily serving. Cut back on concentrated sweets.  Date Started: 08/14/2014  08/14/2014 11:48 AM (EST) (1)  Problem: Overindulging on concentrated sweets  Role: Clinical Pharmacist  Long Term Goal When buying a pie, cut the pie into indivudal pieces and freeze for eating later.  Date Started: 08/14/2014   08/14/2014 11:48 AM (EST) (2)  Problem: Physical Inactivity  Role: Clinical Pharmacist  Long Term Goal Increase physical activity- aim for 5 days a week for 30 minutes.  Date Started: 08/14/2014     Assessment/Plan: Patient is a 53 year old male with DM2. Most recent A1C was 6.6% and is meeting goal of less than 7%. Weight is stable since last visit. Patient has been physically active since his last visit with me and is completing yard work and is walking. He does admit that he has had an issue with snacking on sweets lately. He is worried that it will be worse over the holidays. Along with his husband who was also here for a visit, we came up with a plan for avoiding holiday weight gain. Counseled patient to not buy concentrated sweets and to not bring them into the house. During holiday parties or at work, try to fill your plate with fruits and vegetables first. Limit any concentrated sweets to 1 small serving. If you do buy a pie or a cake, freeze the majority of it for eating later or take it into work for your coworkers to eat. Follow up with patient in 3 months. Goals for Next Visit- 1. The next time that you want to buy a pie or cake, cut it into individual sections and freeze individually. This will prevent you from eating the whole pie in a few days time. 2. Eat more fruits and vegetables. Eliminate concentrated sweets. 3. Increase physical activity- aim for 5 days a week for 30 minutes, combination of yard work and walking. Next visit to see me is Monday February 15th at 7:45 AM.

## 2014-08-17 ENCOUNTER — Ambulatory Visit: Payer: 59 | Admitting: Internal Medicine

## 2014-08-20 ENCOUNTER — Encounter: Payer: Self-pay | Admitting: Internal Medicine

## 2014-08-20 ENCOUNTER — Ambulatory Visit (INDEPENDENT_AMBULATORY_CARE_PROVIDER_SITE_OTHER): Payer: 59 | Admitting: Internal Medicine

## 2014-08-20 VITALS — BP 128/74 | HR 74 | Temp 98.3°F | Ht 74.0 in | Wt 215.3 lb

## 2014-08-20 DIAGNOSIS — E785 Hyperlipidemia, unspecified: Secondary | ICD-10-CM

## 2014-08-20 DIAGNOSIS — K625 Hemorrhage of anus and rectum: Secondary | ICD-10-CM

## 2014-08-20 DIAGNOSIS — E1140 Type 2 diabetes mellitus with diabetic neuropathy, unspecified: Secondary | ICD-10-CM

## 2014-08-20 DIAGNOSIS — I10 Essential (primary) hypertension: Secondary | ICD-10-CM

## 2014-08-20 DIAGNOSIS — B20 Human immunodeficiency virus [HIV] disease: Secondary | ICD-10-CM

## 2014-08-20 MED ORDER — ATORVASTATIN CALCIUM 40 MG PO TABS: 40.0000 mg | ORAL_TABLET | Freq: Every day | ORAL | Status: DC

## 2014-08-20 NOTE — Progress Notes (Signed)
Internal Medicine Clinic Attending  I saw and evaluated the patient.  I personally confirmed the key portions of the history and exam documented by Dr. Raelene Bott and I reviewed pertinent patient test results.  The assessment, diagnosis, and plan were formulated together and I agree with the documentation in the resident's note.

## 2014-08-20 NOTE — Assessment & Plan Note (Signed)
BP Readings from Last 3 Encounters:  08/20/14 128/74  08/14/14 108/70  07/14/14 137/82    Lab Results  Component Value Date   NA 138 07/14/2014   K 4.4 07/14/2014   CREATININE 1.05 07/14/2014    Assessment: Blood pressure control: controlled Progress toward BP goal:    Comments: Patient has historically been well-controlled all the way back to at least 2010.  Plan: Medications:  continue current medications: Lisinopril 20 mg, Maxide 75-50 mg, verapamil 240 mg

## 2014-08-20 NOTE — Patient Instructions (Signed)
Please continue doing a great job with all of your exercise and medications! We have decreased one of your medications from atorvastatin from 80 mg to 40 mg and will follow up your lipids in about 6 months.   General Instructions:   Please bring your medicines with you each time you come to clinic.  Medicines may include prescription medications, over-the-counter medications, herbal remedies, eye drops, vitamins, or other pills.   Progress Toward Treatment Goals:  Treatment Goal 08/20/2014  Hemoglobin A1C at goal  Blood pressure -    Self Care Goals & Plans:  Self Care Goal 08/20/2014  Manage my medications take my medicines as prescribed; bring my medications to every visit; refill my medications on time  Monitor my health -  Eat healthy foods drink diet soda or water instead of juice or soda; eat more vegetables; eat foods that are low in salt; eat baked foods instead of fried foods; eat fruit for snacks and desserts  Be physically active -    Home Blood Glucose Monitoring 07/14/2014  Check my blood sugar once a day  When to check my blood sugar before breakfast     Care Management & Community Referrals:  Referral 07/14/2014  Referrals made for care management support none needed

## 2014-08-20 NOTE — Assessment & Plan Note (Addendum)
Patient continues to be well-controlled, followed by Dr. Tommy Medal in [facility]. Patient's last CD4 in June 2015 was 540, viral load undetectable. Patient on complera.   - Patient to follow-up in ID clinic.

## 2014-08-20 NOTE — Progress Notes (Signed)
   Subjective:    Patient ID: Trevor Fischer, male    DOB: December 17, 1960, 53 y.o.   MRN: [id number]  HPI  Mr. Sizemore is a 53 year old gentleman with a history of HIV (CD4 540,viral load undetectable from June 2015), hyperlipidemia, type 2 diabetes, hypertension, rectal bleeding,GERD who presents to clinic for follow-up of his rectal bleeding.    Please refer to separate problem-list charting for more details.  Review of Systems  Constitutional: Negative for fever and chills.  HENT: Negative for sore throat.   Respiratory: Negative for shortness of breath.   Cardiovascular: Negative for chest pain and palpitations.  Gastrointestinal: Positive for anal bleeding. Negative for nausea, vomiting, abdominal pain, diarrhea, constipation and blood in stool.  Genitourinary: Negative for dysuria and hematuria.  Skin: Negative for rash.  Neurological: Negative for syncope.  Psychiatric/Behavioral: Negative for suicidal ideas.      Objective:   Physical Exam  Constitutional: He is oriented to person, place, and time. He appears well-developed and well-nourished. No distress.  HENT:  Head: Normocephalic and atraumatic.  Eyes: EOM are normal. Pupils are equal, round, and reactive to light. No scleral icterus.  Neck: Normal range of motion. Neck supple. No thyromegaly present.  Cardiovascular: Normal rate and regular rhythm.  Exam reveals no gallop and no friction rub.   No murmur heard. Pulmonary/Chest: Effort normal and breath sounds normal. No respiratory distress. He has no wheezes. He has no rales.  Abdominal: Soft. Bowel sounds are normal. He exhibits no distension. There is no tenderness. There is no rebound.  Musculoskeletal: Normal range of motion. He exhibits edema ( Trace bilateral lower extremity edema).  Neurological: He is alert and oriented to person, place, and time. No cranial nerve deficit.  Skin: No rash noted.      Assessment & Plan:  Please refer to separate problem-list  charting for more details.

## 2014-08-20 NOTE — Assessment & Plan Note (Addendum)
Lab Results  Component Value Date   HGBA1C 6.6 07/01/2014   HGBA1C 6.7 03/04/2014   HGBA1C 6.4 03/03/2013     Assessment: Diabetes control: good control (HgbA1C at goal) Progress toward A1C goal:  at goal   Plan: Medications:  continue current medications: Lantus 23 units daily at bedtime and NovoLog 5 units before meals and at night

## 2014-08-20 NOTE — Assessment & Plan Note (Addendum)
Patient was recently seen in clinic for a history of rectal bleeding, and referred to GI. Patient now status post colonoscopy not concerning for any malignancy but with some mild chronic colitis. Patient was prescribed hemorrhoid cream and states that the frequency of his rectal bleeding has decreased. He reports that before, he was having episodes almost every day, and now it is around once a week.   - Patient will follow-up with GI within the month.  - Patient counseled on the importance of having a high fiber diet, minimizing time on the toilet, and maintaining an active lifestyle.

## 2014-08-20 NOTE — Assessment & Plan Note (Addendum)
Lipid Panel     Component Value Date/Time   CHOL 120 03/05/2014 1534   TRIG 63 03/05/2014 1534   HDL 29* 03/05/2014 1534   CHOLHDL 4.1 03/05/2014 1534   VLDL 13 03/05/2014 1534   LDLCALC 78 03/05/2014 1534    Patient's last lipid panel in June 2015 all within normal limits except for a low HDL of 29. Patient's ten-year ASCVD risk is 6%. Patient was transitioned from rosuvastatin 40 mg to atorvastatin 80 mg in July 2014 by Dr. Michail Sermon with no significant change in LDL (has remained below 85 since that transition).  - Will trial patient on 40 mg atorvastatin given very well controlled lipids in an effort to minimize risk of adverse effects.

## 2014-09-21 ENCOUNTER — Other Ambulatory Visit: Payer: Self-pay | Admitting: Infectious Disease

## 2014-09-21 NOTE — Progress Notes (Signed)
Patient ID: [name], male   DOB: [birth year], 53 y.o.   MRN: [id number] Reviewed: Agree with the documentation and management of our [name].

## 2014-09-22 ENCOUNTER — Other Ambulatory Visit: Payer: 59

## 2014-09-22 DIAGNOSIS — Z79899 Other long term (current) drug therapy: Secondary | ICD-10-CM

## 2014-09-22 DIAGNOSIS — B20 Human immunodeficiency virus [HIV] disease: Secondary | ICD-10-CM

## 2014-09-22 DIAGNOSIS — Z113 Encounter for screening for infections with a predominantly sexual mode of transmission: Secondary | ICD-10-CM

## 2014-09-22 LAB — COMPLETE METABOLIC PANEL WITH GFR
ALT: 74 U/L — ABNORMAL HIGH (ref 0–53)
AST: 77 U/L — AB (ref 0–37)
Albumin: 4.1 g/dL (ref 3.5–5.2)
Alkaline Phosphatase: 110 U/L (ref 39–117)
BUN: 22 mg/dL (ref 6–23)
CO2: 27 mEq/L (ref 19–32)
CREATININE: 1.1 mg/dL (ref 0.50–1.35)
Calcium: 9.3 mg/dL (ref 8.4–10.5)
Chloride: 102 mEq/L (ref 96–112)
GFR, EST AFRICAN AMERICAN: 87 mL/min
GFR, Est Non African American: 75 mL/min
Glucose, Bld: 87 mg/dL (ref 70–99)
Potassium: 4.3 mEq/L (ref 3.5–5.3)
Sodium: 136 mEq/L (ref 135–145)
Total Bilirubin: 0.5 mg/dL (ref 0.2–1.2)
Total Protein: 6.8 g/dL (ref 6.0–8.3)

## 2014-09-22 LAB — CBC WITH DIFFERENTIAL/PLATELET
BASOS ABS: 0.1 10*3/uL (ref 0.0–0.1)
Basophils Relative: 1 % (ref 0–1)
EOS PCT: 18 % — AB (ref 0–5)
Eosinophils Absolute: 1 10*3/uL — ABNORMAL HIGH (ref 0.0–0.7)
HCT: 41.7 % (ref 39.0–52.0)
Hemoglobin: 13.8 g/dL (ref 13.0–17.0)
LYMPHS PCT: 38 % (ref 12–46)
Lymphs Abs: 2.1 10*3/uL (ref 0.7–4.0)
MCH: 26.7 pg (ref 26.0–34.0)
MCHC: 33.1 g/dL (ref 30.0–36.0)
MCV: 80.8 fL (ref 78.0–100.0)
MONO ABS: 0.4 10*3/uL (ref 0.1–1.0)
MPV: 8.7 fL — ABNORMAL LOW (ref 9.4–12.4)
Monocytes Relative: 8 % (ref 3–12)
Neutro Abs: 2 10*3/uL (ref 1.7–7.7)
Neutrophils Relative %: 35 % — ABNORMAL LOW (ref 43–77)
PLATELETS: 243 10*3/uL (ref 150–400)
RBC: 5.16 MIL/uL (ref 4.22–5.81)
RDW: 14 % (ref 11.5–15.5)
WBC: 5.6 10*3/uL (ref 4.0–10.5)

## 2014-09-22 LAB — LIPID PANEL
CHOLESTEROL: 152 mg/dL (ref 0–200)
HDL: 36 mg/dL — ABNORMAL LOW (ref >39)
LDL Cholesterol: 104 mg/dL — ABNORMAL HIGH (ref 0–99)
TRIGLYCERIDES: 62 mg/dL (ref <150)
Total CHOL/HDL Ratio: 4.2 Ratio
VLDL: 12 mg/dL (ref 0–40)

## 2014-09-23 LAB — URINE CYTOLOGY ANCILLARY ONLY
Chlamydia: NEGATIVE
Neisseria Gonorrhea: NEGATIVE

## 2014-09-23 LAB — MICROALBUMIN / CREATININE URINE RATIO
CREATININE, URINE: 75.5 mg/dL
Microalb Creat Ratio: 5.3 mg/g (ref 0.0–30.0)
Microalb, Ur: 0.4 mg/dL (ref <2.0)

## 2014-09-23 LAB — T-HELPER CELL (CD4) - (RCID CLINIC ONLY)
CD4 % Helper T Cell: 35 % (ref 33–55)
CD4 T Cell Abs: 750 /uL (ref 400–2700)

## 2014-09-23 LAB — RPR

## 2014-09-26 LAB — HIV-1 RNA QUANT-NO REFLEX-BLD
HIV 1 RNA Quant: <20 copies/mL (ref <20)
HIV-1 RNA Quant, Log: <1.3 {Log} (ref <1.30)

## 2014-09-28 ENCOUNTER — Other Ambulatory Visit: Payer: Self-pay | Admitting: Infectious Disease

## 2014-09-28 ENCOUNTER — Other Ambulatory Visit: Payer: Self-pay | Admitting: *Deleted

## 2014-10-04 MED ORDER — ALCOHOL PREPS PADS: 1.0000 | MEDICATED_PAD | Freq: Three times a day (TID) | Status: DC

## 2014-10-06 NOTE — Telephone Encounter (Signed)
Rx called in to pharmacy. Hilda Blades Darold Miley RN 10/06/14 2PM

## 2014-10-13 ENCOUNTER — Other Ambulatory Visit: Payer: Self-pay | Admitting: Infectious Disease

## 2014-10-14 ENCOUNTER — Ambulatory Visit (INDEPENDENT_AMBULATORY_CARE_PROVIDER_SITE_OTHER): Payer: 59 | Admitting: Infectious Disease

## 2014-10-14 ENCOUNTER — Encounter: Payer: Self-pay | Admitting: Infectious Disease

## 2014-10-14 VITALS — BP 131/80 | HR 66 | Temp 98.4°F | Wt 216.0 lb

## 2014-10-14 DIAGNOSIS — E1040 Type 1 diabetes mellitus with diabetic neuropathy, unspecified: Secondary | ICD-10-CM

## 2014-10-14 DIAGNOSIS — E785 Hyperlipidemia, unspecified: Secondary | ICD-10-CM

## 2014-10-14 DIAGNOSIS — R7401 Elevation of levels of liver transaminase levels: Secondary | ICD-10-CM | POA: Insufficient documentation

## 2014-10-14 DIAGNOSIS — R935 Abnormal findings on diagnostic imaging of other abdominal regions, including retroperitoneum: Secondary | ICD-10-CM

## 2014-10-14 DIAGNOSIS — B20 Human immunodeficiency virus [HIV] disease: Secondary | ICD-10-CM

## 2014-10-14 DIAGNOSIS — R740 Nonspecific elevation of levels of transaminase and lactic acid dehydrogenase [LDH]: Secondary | ICD-10-CM

## 2014-10-14 DIAGNOSIS — I10 Essential (primary) hypertension: Secondary | ICD-10-CM

## 2014-10-14 HISTORY — DX: Elevation of levels of liver transaminase levels: R74.01

## 2014-10-14 NOTE — Progress Notes (Signed)
  Subjective:    Patient ID: Trevor Fischer, male    DOB: 27-Oct-1960, 54 y.o.   MRN: [id number]  HPI  Trevor Fischer is a 54 y.o. male who is doing superbly well on his  antiviral regimen complera with undetectable viral load and health cd4 count.   Both he and his husband are here today again.  He asked me about idea of coming off of ARVs and I immediately told him that I thought that this would be a very BAD idea, based on available evidence for his health both in terms of HIV related and not classically HIV related pathology including CAD. He has had elevated LFts that in particular bothered him. 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  Review of Systems  Constitutional: Negative for fever, chills, diaphoresis, activity change, appetite change, fatigue and unexpected weight change.  HENT: Negative for congestion, rhinorrhea, sinus pressure, sneezing, sore throat and trouble swallowing.   Eyes: Negative for photophobia and visual disturbance.  Respiratory: Negative for cough, chest tightness, shortness of breath, wheezing and stridor.   Cardiovascular: Negative for palpitations and leg swelling.  Gastrointestinal: Negative for nausea, vomiting, abdominal pain, diarrhea, constipation, blood in stool, abdominal distention and anal bleeding.  Endocrine: Negative for heat intolerance, polydipsia and polyuria.  Genitourinary: Negative for dysuria, hematuria, flank pain and difficulty urinating.  Musculoskeletal: Negative for myalgias, joint swelling and gait problem.  Skin: Negative for color change, pallor, rash and wound.  Neurological: Negative for dizziness, tremors, weakness and light-headedness.  Hematological: Negative for adenopathy. Does not bruise/bleed easily.  Psychiatric/Behavioral: Negative for behavioral problems, confusion, sleep disturbance, dysphoric mood, decreased concentration and agitation.       Objective:   Physical Exam  Constitutional: He  is oriented to person, place, and time. He appears well-developed and well-nourished. No distress.  HENT:  Head: Normocephalic and atraumatic.  Mouth/Throat: Oropharynx is clear and moist. No oropharyngeal exudate.  Eyes: Conjunctivae and EOM are normal. Pupils are equal, round, and reactive to light. No scleral icterus.  Neck: Normal range of motion. Neck supple. No JVD present.  Cardiovascular: Normal rate, regular rhythm and normal heart sounds.  Exam reveals no gallop and no friction rub.   No murmur heard. Pulmonary/Chest: Effort normal and breath sounds normal. No respiratory distress. He has no wheezes. He has no rales. He exhibits no tenderness.  Abdominal: He exhibits no distension and no mass. There is no tenderness. There is no rebound and no guarding.  Musculoskeletal: He exhibits no edema or tenderness.  Lymphadenopathy:    He has no cervical adenopathy.  Neurological: He is alert and oriented to person, place, and time. He has normal reflexes. He exhibits normal muscle tone. Coordination normal.  Skin: Skin is warm and dry. He is not diaphoretic. No erythema. No pallor.  Psychiatric: He has a normal mood and affect. His behavior is normal. Judgment and thought content normal.          Assessment & Plan:   HIV: Continue complera with meal daily   DM: followed by Dr. Michail Sermon, and better controlled  Hyperlipidemia: on 80 of lipitor and LDL is at goal  Transaminitis: will check hep panel and order [name]

## 2014-10-15 LAB — HEPATITIS PANEL, ACUTE
HCV AB: NEGATIVE
HEP A IGM: NONREACTIVE
Hep B C IgM: NONREACTIVE
Hepatitis B Surface Ag: NEGATIVE

## 2014-11-05 ENCOUNTER — Encounter: Payer: Self-pay | Admitting: Internal Medicine

## 2014-11-05 ENCOUNTER — Ambulatory Visit (INDEPENDENT_AMBULATORY_CARE_PROVIDER_SITE_OTHER): Payer: 59 | Admitting: Internal Medicine

## 2014-11-05 VITALS — BP 123/82 | HR 85 | Temp 98.2°F | Wt 219.0 lb

## 2014-11-05 DIAGNOSIS — R740 Nonspecific elevation of levels of transaminase and lactic acid dehydrogenase [LDH]: Secondary | ICD-10-CM

## 2014-11-05 DIAGNOSIS — I10 Essential (primary) hypertension: Secondary | ICD-10-CM

## 2014-11-05 DIAGNOSIS — R7401 Elevation of levels of liver transaminase levels: Secondary | ICD-10-CM

## 2014-11-05 DIAGNOSIS — E1140 Type 2 diabetes mellitus with diabetic neuropathy, unspecified: Secondary | ICD-10-CM

## 2014-11-05 DIAGNOSIS — B20 Human immunodeficiency virus [HIV] disease: Secondary | ICD-10-CM

## 2014-11-05 DIAGNOSIS — E785 Hyperlipidemia, unspecified: Secondary | ICD-10-CM

## 2014-11-05 DIAGNOSIS — Z794 Long term (current) use of insulin: Secondary | ICD-10-CM

## 2014-11-05 LAB — POCT GLYCOSYLATED HEMOGLOBIN (HGB A1C): Hemoglobin A1C: 7.4

## 2014-11-05 LAB — GLUCOSE, CAPILLARY: Glucose-Capillary: 219 mg/dL — ABNORMAL HIGH (ref 70–99)

## 2014-11-05 MED ORDER — LOSARTAN POTASSIUM 50 MG PO TABS: 50.0000 mg | ORAL_TABLET | Freq: Every day | ORAL | Status: DC

## 2014-11-05 MED ORDER — ACIDOPHILUS PROBIOTIC 10 MG PO TABS: 10.0000 mg | ORAL_TABLET | Freq: Three times a day (TID) | ORAL | Status: DC

## 2014-11-05 NOTE — Assessment & Plan Note (Addendum)
Patient has very well controlled disease and follows up regularly with infectious disease clinic. CD4 count of 750 and viral load undetectable one month ago. -Continue with complera and management per infectious disease clinic.

## 2014-11-05 NOTE — Patient Instructions (Addendum)
Please try to skip her insulin if you are owing to skip a meal. Please also try to focus on diet and exercise as your A1c has increased to 7.4 this time. I'll suggest that we keep your insulin regimen the same at this point. I have also changed your lisinopril over to a different medication that also lowers her blood pressure but has a lower incidence of cough called losartan. Please continue doing a great job controlling her blood pressure. I have also prescribed a probiotic to see whether that would be cost effective for you. Please continue following up with infectious disease clinic for management of your HIV. We will be in contact with you regarding your ultrasound.  General Instructions:   Thank you for bringing your medicines today. This helps [name] keep you safe from mistakes.   Progress Toward Treatment Goals:  Treatment Goal 08/20/2014  Hemoglobin A1C at goal  Blood pressure -    Self Care Goals & Plans:  Self Care Goal 08/20/2014  Manage my medications take my medicines as prescribed; bring my medications to every visit; refill my medications on time  Monitor my health -  Eat healthy foods drink diet soda or water instead of juice or soda; eat more vegetables; eat foods that are low in salt; eat baked foods instead of fried foods; eat fruit for snacks and desserts  Be physically active -    Home Blood Glucose Monitoring 07/14/2014  Check my blood sugar once a day  When to check my blood sugar before breakfast     Care Management & Community Referrals:  Referral 07/14/2014  Referrals made for care management support none needed

## 2014-11-05 NOTE — Assessment & Plan Note (Signed)
BP Readings from Last 3 Encounters:  11/05/14 123/82  10/14/14 131/80  08/20/14 128/74    Lab Results  Component Value Date   NA 136 09/22/2014   K 4.3 09/22/2014   CREATININE 1.10 09/22/2014    Assessment: Blood pressure control: controlled Progress toward BP goal:  at goal Comments: Patient states that he is compliant with his home regimen of lisinopril 20 mg daily, Maxide 75-50 milligrams daily, and verapamil 240 mg daily. However, patient states that he has had a chronic daily dry cough for the last several months. He denies any associated symptoms such as nasal congestion, rhinorrhea, itchy eyes, productive cough, dyspnea, or wheezing. This could be likely due to lisinopril.  Plan: Medications:  Continue with Maxide and verapamil. Switch from lisinopril to losartan 50 mg daily Educational resources provided:   Self management tools provided:   Other plans: None

## 2014-11-05 NOTE — Assessment & Plan Note (Signed)
Lab Results  Component Value Date   HGBA1C 7.4 11/05/2014   HGBA1C 6.6 07/01/2014   HGBA1C 6.7 03/04/2014     Assessment: Diabetes control: fair control Progress toward A1C goal:  deteriorated Comments: Patient states that he he has been compliant with his home regimen of Lantus 23 units daily at bedtime and NovoLog 5 units before meals and at bedtime. He states that his A1c may have deteriorated over the holiday season. Patient states that he is motivated and prefers to try diet and exercise to try to better control his A1c in the future.  Plan: Medications:  continue current medications Home glucose monitoring: Frequency:   3 times a day Timing:   before meals Instruction/counseling given: discussed the need for weight loss and discussed diet Educational resources provided:   Self management tools provided:   Other plans: None

## 2014-11-05 NOTE — Progress Notes (Signed)
   Subjective:    Patient ID: Trevor Fischer, male    DOB: Dec 06, 1960, 54 y.o.   MRN: [id number]  HPI  Patient is a 54 year old male with a history of diabetes, hypertension, HIV, hyperlipidemia, transaminitis who presents to clinic for a routine follow-up.   Please refer to separate problem-list charting for more details.   Review of Systems  Constitutional: Negative for fever and chills.  HENT: Negative for sore throat.   Respiratory: Positive for cough. Negative for shortness of breath.   Cardiovascular: Negative for chest pain and palpitations.  Gastrointestinal: Negative for nausea, vomiting, abdominal pain, diarrhea, constipation and blood in stool.  Genitourinary: Negative for dysuria and hematuria.  Skin: Negative for rash.  Neurological: Negative for syncope.  Psychiatric/Behavioral: Negative for suicidal ideas.       Objective:   Physical Exam  Constitutional: He is oriented to person, place, and time. He appears well-developed and well-nourished. No distress.  HENT:  Head: Normocephalic and atraumatic.  Eyes: EOM are normal. Pupils are equal, round, and reactive to light. No scleral icterus.  Neck: Normal range of motion. Neck supple. No thyromegaly present.  Cardiovascular: Normal rate and regular rhythm.  Exam reveals no gallop and no friction rub.   No murmur heard. Pulmonary/Chest: Effort normal and breath sounds normal. No respiratory distress. He has no wheezes. He has no rales.  Abdominal: Soft. Bowel sounds are normal. He exhibits no distension. There is no tenderness. There is no rebound.  Musculoskeletal: Normal range of motion. He exhibits no edema.  Neurological: He is alert and oriented to person, place, and time. No cranial nerve deficit.  Skin: No rash noted.          Assessment & Plan:  Please refer to separate problem-list charting for more details.

## 2014-11-05 NOTE — Assessment & Plan Note (Addendum)
Patient continues to tolerate his atorvastatin at 40 mg. This was titrated down last time given his good lipid profiles. -Patient will be due in June 2016 for a repeat lipid panel.

## 2014-11-05 NOTE — Progress Notes (Signed)
Case discussed with Dr. Raelene Bott at the time of the visit.  We reviewed the resident's history and exam and pertinent patient test results.  I agree with the assessment, diagnosis, and plan of care documented in the resident's note.

## 2014-11-05 NOTE — Assessment & Plan Note (Signed)
Patient noted to have mildly elevated liver enzymes up to 70s for AST and ALT from 1 month ago. Hepatitis panel sent from infectious disease clinic negative. Patient was also sent for an abdominal ultrasound to evaluate for possible fatty liver disease versus other pathologies though patient never was contacted for this study. -Will reconnect patient with an appointment for a abdominal ultrasound

## 2014-11-12 NOTE — Telephone Encounter (Signed)
TEST!!!

## 2014-11-16 ENCOUNTER — Ambulatory Visit (INDEPENDENT_AMBULATORY_CARE_PROVIDER_SITE_OTHER): Payer: Self-pay | Admitting: Family Medicine

## 2014-11-16 VITALS — BP 114/72 | Wt 219.6 lb

## 2014-11-16 DIAGNOSIS — E119 Type 2 diabetes mellitus without complications: Secondary | ICD-10-CM

## 2014-11-16 NOTE — Assessment & Plan Note (Signed)
Subjective:  Patient presents today for 3 month diabetes follow-up as part of the employer-sponsored Link to Wellness program. Current diabetes regimen includes Novolog 2-5 units SQ with meals and Lantus 23 units SQ once daily. Patient also continues on daily ASA, ARB and statin. Most recent MD follow-up was February 4th 2016. A1C at that time was 7.4%. No major health changes at this time.  Medication changes- lisinopril was d/c due to cough and changed to losartan 50 mg daily. Atorvastatin was also decreased to 40 mg from 80 mg.  Patient was scheduled for an abdominal X-ray but he missed his appointment since it was scheduled the same day his husband was admitted to the hospital.   Disease Assessments:  Diabetes: Type of Diabetes: Type 2; Year of diagnosis 2010; MD managing Diabetes Dr. Dorian Heckle; Sees Diabetes provider 1 time a year; checks blood glucose 3-4 times a day; checks feet daily; uses glucometer; takes medications as prescribed; takes an aspirin a day; hypoglycemia frequency rarely;   7 day CBG average 127; 14 day CBG average 128; 30 day CBG average 118; Lowest CBG 65 patient states he gave insulin to cover for his meal and then didn't end up eating his whole meal.; Highest CBG 195;   Other Diabetes History: Patient is checking 3-4 times daily. Reviewed patient's meter. The majority of readings are in the low 100s. A few episodes of hypoglycemia.        Social History:  Caffeine use: coffee tea1-2 cups coffee/wk; Alcohol use: 1 - 2 drinks per year; Medication adherence adherent; Patient can afford medications; Patient knows the purpose/use of medications; 45 minutes of exercise per week; Exercise adherence 3-4 days a week; Diet adherence 50-75% of the time.   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 He is tracking his steps when he is working and he is usually getting more  than 10,000 steps. On the days he isn't working he isn't getting as many steps. He hasn't been walking because of the weather.  Nutrition- He states that he has been eating more since the holidays. This past weekend he got more fresh produce and wants to eat that for a snack. Weight is up 6 pounds since his last appointment with me.  He states that he is eating more cookies, potato chips. He is not watching portion sizes and he isn't counting carbohydrates.                         Vital Signs:    11/16/2014 8:25 AM (EST)Blood Pressure 114 / 72 mm/HgBMI 28.2; Height 6 ft 2 in; Weight 219.6 lbs      Care Planning:  Learning Preference Assessment:  Learner: Patient  Readiness to Learn Barriers: None  Teaching Method: Explanation  Evaluation of Learning: Can function independently and verbalize knowledge  Readiness to Change:  How important is your health to you? 10  How confident are you in working to improve your health? 10  How ready are you to change to improve your health? 10  Total Score: 10  Care Plan:  11/16/2014 8:25 AM (EST) (2)  Problem: Physical Inactivity  Role: Clinical Pharmacist  Long Term Goal Increase physical activity- aim for 5 days a week for 30 minutes.  Date Started: 08/14/2014  No Goal Met Lately he has not been exercising because of the weather.  Short Term Goal #1:  Walk on your days off for 30 minutes.  Date Started: 11/16/2014  11/16/2014 8:25 AM (EST) (3)  Problem: Inadequate consumption of fruits and vegetable  Role: Clinical Pharmacist  Long Term Goal Increase fruit and vegetable intake to at least 2 daily servings.  Date Started: 11/16/2014  No Goal Met Patient is still struggling with eating concentrated sweets. He has a hard time with the food that others bring into work.    For fruits and vegetables he goes through spurts where he will eat some and then he will stop. He states he gets tired of them.  11/16/2014 8:25 AM (EST) (1)   Problem: Overindulging on concentrated sweets  Role: Clinical Pharmacist  Long Term Goal When buying a pie, cut the pie into indivudal pieces and freeze for eating later.  Date Started: 08/14/2014  Goal Met patient stopped buying pies. However he is still eating other concentrated sweets.  Short Term Goal #1: Cut back on concentrated sweets to twice a week.  Date Started: 11/16/2014  11/16/2014 8:24 AM (EST) (4)  Problem: Not counting carbohydrates  Role: Clinical Pharmacist  Long Term Goal Count carbohydrates with each meal. Aim for 80-100 grams carbhohydrates with each meal.     Assessment/Plan: Patient is a 54 year old male with DM2. Most recent A1C was 7.4% and is above goal of less than 7%. Weight is up 6 pounds and patient admits he has not been making healthy eating choices. He is not counting carbohydrates and is eating concentrated sweets on a regular basis. 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 Patient is frustrated that he has gained weight and he wants to make changes to his diet and routine. He states that he got out of the habit of counting carbohydrates and he wants to start counting carbs again since that helped him to not overeat. We discussed healthier snack options, including fruits and vegetables. I also discussed with patient listening to his hunger cues to help him to not overeat.  Follow up with patient in 3 months.  Goals for Next Appointment- 1. Increase fruit and vegetable consumption to twice daily. 2. Cut back on concentrated sweets to twice a week. 3. On the days you are off, walk for 30 minutes. Even if it is cold- bundle up and go. Next appointment to see me is Monday May 16th at 7:45 AM.

## 2014-11-26 ENCOUNTER — Other Ambulatory Visit: Payer: Self-pay | Admitting: *Deleted

## 2014-11-26 NOTE — Telephone Encounter (Signed)
Pt wants to change to true test strips and lancets.  Cheaper. Please delete other strips and lancets.

## 2014-11-27 MED ORDER — GLUCOSE BLOOD VI STRP: ORAL_STRIP | Status: DC

## 2014-11-27 MED ORDER — TRUEPLUS LANCETS 33G MISC: 1.0000 [IU] | Freq: Three times a day (TID) | Status: DC

## 2014-12-03 NOTE — Progress Notes (Signed)
Patient ID: Trevor Fischer, male   DOB: 1961/02/18, 54 y.o.   MRN: [id number] Reviewed: Agree with the documentation and management of our [place].

## 2014-12-16 ENCOUNTER — Other Ambulatory Visit: Payer: Self-pay | Admitting: *Deleted

## 2014-12-16 MED ORDER — INSULIN PEN NEEDLE 31G X 8 MM MISC: Status: DC

## 2014-12-24 ENCOUNTER — Ambulatory Visit
Admission: RE | Admit: 2014-12-24 | Discharge: 2014-12-24 | Disposition: A | Discharge status: Home / Self Care | Payer: 59 | Source: Ambulatory Visit | Attending: Infectious Disease | Admitting: Infectious Disease

## 2014-12-24 DIAGNOSIS — R7401 Elevation of levels of liver transaminase levels: Secondary | ICD-10-CM

## 2014-12-24 DIAGNOSIS — R740 Nonspecific elevation of levels of transaminase and lactic acid dehydrogenase [LDH]: Principal | ICD-10-CM

## 2014-12-28 ENCOUNTER — Other Ambulatory Visit: Payer: Self-pay | Admitting: Infectious Disease

## 2015-01-06 ENCOUNTER — Other Ambulatory Visit: Payer: Self-pay | Admitting: *Deleted

## 2015-01-06 MED ORDER — INSULIN GLARGINE 100 UNIT/ML SOLOSTAR PEN: 23.0000 [IU] | PEN_INJECTOR | Freq: Every day | SUBCUTANEOUS | Status: DC

## 2015-01-12 ENCOUNTER — Telehealth: Payer: Self-pay

## 2015-01-12 NOTE — Telephone Encounter (Signed)
The kidney cyst is undoubtedly benign. [name] is usually best test for renal cysts. I dont think we need to go to MRI anything like that. They did not suggest anything worrisome about it.  As far as normalizing his liver function tests, Iwould need to review further his [name] to see if he has fatty liver on [name] for example. I don think there is much we can do with his ARV regimen to improve his LFTs

## 2015-01-12 NOTE — Telephone Encounter (Signed)
Pt has reviewed results of abdominal ultrasound and noticed results mentions a cyst on left kidney.  Should he be concerned about this ? Also, he wonders if there is a way to correct the elevated liver enzymes.  Patient prefers to be contacted through my chart with response.   Laverle Patter, RN

## 2015-02-09 ENCOUNTER — Encounter: Payer: Self-pay | Admitting: *Deleted

## 2015-02-15 ENCOUNTER — Ambulatory Visit (INDEPENDENT_AMBULATORY_CARE_PROVIDER_SITE_OTHER): Payer: Self-pay | Admitting: Family Medicine

## 2015-02-15 ENCOUNTER — Ambulatory Visit: Payer: Self-pay | Admitting: Pharmacist

## 2015-02-15 VITALS — BP 124/78 | Ht 74.0 in | Wt 216.0 lb

## 2015-02-15 DIAGNOSIS — E119 Type 2 diabetes mellitus without complications: Secondary | ICD-10-CM

## 2015-02-15 LAB — POCT GLYCOSYLATED HEMOGLOBIN (HGB A1C): HEMOGLOBIN A1C: 8.5

## 2015-02-15 NOTE — Progress Notes (Signed)
Subjective:  Patient presents today for 3 month diabetes follow-up as part of the employer-sponsored Link to Wellness program.  Current diabetes regimen includes Lantus 23 units SQ once daily, Novolog 5 units SQ with meals. Patient also continues on daily ASA, ARB and statin.  Most recent MD follow-up was in February with Dr. Raelene Bott. Patient has no pending appointments. No med changes or major health changes at this time.   Patient states that his CBGs have been higher lately. He admits that he has been eating more cookies lately.   He requested that we check A1C today.    Assessment/Plan:  Patient is a  54 yo male with DM 1. Most recent A1C was   8.5 % which is at exceeding goal of less than 7%. Weight is decreased by 3 pounds from last visit with me.   A1C is increased today to 8.5%. This is likely secondary to patient's dietary indiscretions. He admits that he is eating cookies several times during the day. Last A1C was 7.4% in February. A1C could also be elevated because patient switched from the AccuChek expert meter which featured bolus dosing advice based on carbohydrate reading to a generic meter. I will get test strips changed back to Accu Chek and explained to patient that in order to make them zero copay with the Link to Wellness program he would have to have the strips filled the same day as his insulin.     CBG Review: He states that he has had 1 low blood sugar- 56. This was following exercise and working in the yard. He corrected this with eating a meal (low occurred just prior to meal time). The highest was 180.   He is checking 3-4 times daily (fasting and before meals).   Lifestyle improvements:  Physical Activity-  This past week he has been more physically active and has been working in the yard.  He is walking 4-5 miles at work the nights that he is working. He is walking 30-45 minutes twice a week around the neighborhood.   Nutrition-  He admits that he has been  struggling with eating cookies. His husband keeps buying the cookies.   He is eating three meals daily.  B- eggs, bacon, toast, juice (~1/2 cup) L- sandwich and chips, sometimes a piece of fruit D- meat, vegetables, protein and a carb  Snacking- cookies.   Patient states that he is eating vegetables twice a day and fruit once a day (this was one of his goals from last time.    Follow up with me in 3 months.    Goals for Next Visit:  1. Cut back on sweets - limit to 1-2 cookies daily instead of 4-5 a few times a day. 2. Continue walking on your days off - 4 days a week for 30-45 minutes. 3. Continue eating fresh produce in season and vegetables twice a day. 4. When you need a refill for Novolog, get the AccuChek test strips filled so you can use the AccuChek meter.   I will send A1C results to Dr. Raelene Bott.   Next appointment to see me is: Monday July 18th at 8 AM.    Truett Mainland. Donneta Romberg, PharmD, BCPS, CDE [facility] to [place] Coordinator [phone]

## 2015-02-25 NOTE — Progress Notes (Signed)
ATTENDING PHYSICIAN NOTE: I have reviewed the chart and agree with the plan as detailed above. Sara Neal MD Pager [phone]  

## 2015-02-26 ENCOUNTER — Encounter: Payer: Self-pay | Admitting: Pharmacist

## 2015-03-02 ENCOUNTER — Other Ambulatory Visit: Payer: Self-pay | Admitting: Internal Medicine

## 2015-03-04 ENCOUNTER — Encounter: Payer: Self-pay | Admitting: Internal Medicine

## 2015-03-04 ENCOUNTER — Ambulatory Visit (INDEPENDENT_AMBULATORY_CARE_PROVIDER_SITE_OTHER): Payer: 59 | Admitting: Internal Medicine

## 2015-03-04 VITALS — BP 127/66 | HR 70 | Temp 98.0°F | Ht 74.0 in | Wt 215.2 lb

## 2015-03-04 DIAGNOSIS — I10 Essential (primary) hypertension: Secondary | ICD-10-CM

## 2015-03-04 DIAGNOSIS — B20 Human immunodeficiency virus [HIV] disease: Secondary | ICD-10-CM

## 2015-03-04 DIAGNOSIS — Z21 Asymptomatic human immunodeficiency virus [HIV] infection status: Secondary | ICD-10-CM

## 2015-03-04 DIAGNOSIS — E1140 Type 2 diabetes mellitus with diabetic neuropathy, unspecified: Secondary | ICD-10-CM

## 2015-03-04 DIAGNOSIS — Z79899 Other long term (current) drug therapy: Secondary | ICD-10-CM

## 2015-03-04 DIAGNOSIS — Z794 Long term (current) use of insulin: Secondary | ICD-10-CM

## 2015-03-04 NOTE — Progress Notes (Signed)
   Subjective:    Patient ID: Trevor Fischer, male    DOB: [birth year], 54 y.o.   MRN: [id number]  HPI  Patient is a 54 year old male with a history of diabetes, hypertension, HIV, hyperlipidemia who presents to clinic for a routine follow-up.  Please see problem based charting for more details.  Review of Systems  Constitutional: Negative for fever and chills.  HENT: Negative for sore throat.   Respiratory: Negative for shortness of breath.   Cardiovascular: Negative for chest pain and palpitations.  Gastrointestinal: Negative for nausea, vomiting, abdominal pain, diarrhea, constipation and blood in stool.  Genitourinary: Negative for dysuria and hematuria.  Skin: Negative for rash.  Neurological: Negative for syncope.  Psychiatric/Behavioral: Negative for suicidal ideas.       Objective:   Physical Exam  Constitutional: He is oriented to person, place, and time. He appears well-developed and well-nourished. No distress.  HENT:  Head: Normocephalic and atraumatic.  Eyes: EOM are normal. Pupils are equal, round, and reactive to light. No scleral icterus.  Neck: Normal range of motion. Neck supple. No thyromegaly present.  Cardiovascular: Normal rate and regular rhythm.  Exam reveals no gallop and no friction rub.   No murmur heard. Pulmonary/Chest: Effort normal and breath sounds normal. No respiratory distress. He has no wheezes. He has no rales.  Abdominal: Soft. Bowel sounds are normal. He exhibits no distension. There is no tenderness. There is no rebound.  Musculoskeletal: Normal range of motion. He exhibits no edema.  Neurological: He is alert and oriented to person, place, and time. No cranial nerve deficit.  Skin: No rash noted.          Assessment & Plan:  Please see problem based charting for more details.

## 2015-03-04 NOTE — Progress Notes (Signed)
Medicine attending: Medical history, presenting problems, physical findings, and medications, reviewed with Dr Luan Moore and I concur with his evaluation and management plan.

## 2015-03-04 NOTE — Assessment & Plan Note (Signed)
Lab Results  Component Value Date   HGBA1C 8.5 02/15/2015   HGBA1C 7.4 11/05/2014   HGBA1C 6.6 07/01/2014     Assessment: Diabetes control: fair control Progress toward A1C goal:  deteriorated Comments: Patient has been compliant with his Lantus 10 units daily at bedtime and NovoLog 3 times a day with nighttime coverage. He states that he has previously been consistently eating 4-5 Center due to cookies a day which may have resulted in his deteriorated A1c as listed above. He states that he has since stopped eating cookies altogether.  Plan: Medications:  continue current medications Home glucose monitoring: Frequency: 3 times a day Timing: before meals Instruction/counseling given: reminded to bring blood glucose meter & log to each visit Educational resources provided: brochure, handout Self management tools provided: copy of home glucose meter download Other plans: None

## 2015-03-04 NOTE — Assessment & Plan Note (Signed)
Patient well-controlled with a CD4 count of 750 and undetectable viral load from December 2015. Patient states that he will follow-up with infectious disease clinic soon in August. He states that he has been compliant with his Complera.

## 2015-03-04 NOTE — Patient Instructions (Signed)
Please continue doing a great job managing your chronic conditions. It looks like you're doing well with your blood pressure. Please continue staying away from the cookies for your diabetes. Please follow-up with infectious disease for your HIV.

## 2015-03-04 NOTE — Assessment & Plan Note (Signed)
BP Readings from Last 3 Encounters:  03/04/15 127/66  02/15/15 124/78  11/16/14 114/72    Lab Results  Component Value Date   NA 136 09/22/2014   K 4.3 09/22/2014   CREATININE 1.10 09/22/2014    Assessment: Blood pressure control: controlled Progress toward BP goal:  at goal Comments: Patient has been compliant with his Maxide 75-50 mg.  Plan: Medications:  continue current medications Educational resources provided: brochure, handout, video Self management tools provided:   Other plans: None

## 2015-03-10 ENCOUNTER — Other Ambulatory Visit: Payer: Self-pay | Admitting: *Deleted

## 2015-03-10 MED ORDER — GABAPENTIN 300 MG PO CAPS: 300.0000 mg | ORAL_CAPSULE | Freq: Three times a day (TID) | ORAL | Status: DC

## 2015-03-15 ENCOUNTER — Other Ambulatory Visit: Payer: Self-pay | Admitting: Infectious Disease

## 2015-03-19 ENCOUNTER — Other Ambulatory Visit: Payer: Self-pay | Admitting: Infectious Disease

## 2015-04-02 ENCOUNTER — Other Ambulatory Visit: Payer: Self-pay | Admitting: Internal Medicine

## 2015-04-02 NOTE — Telephone Encounter (Signed)
Just filled 6/24, added Dx code in case this was the issue.

## 2015-04-13 ENCOUNTER — Ambulatory Visit (INDEPENDENT_AMBULATORY_CARE_PROVIDER_SITE_OTHER): Payer: 59 | Admitting: Internal Medicine

## 2015-04-13 ENCOUNTER — Encounter: Payer: Self-pay | Admitting: Internal Medicine

## 2015-04-13 VITALS — BP 139/56 | HR 62 | Temp 98.2°F | Ht 74.0 in | Wt 215.5 lb

## 2015-04-13 DIAGNOSIS — I10 Essential (primary) hypertension: Secondary | ICD-10-CM | POA: Diagnosis not present

## 2015-04-13 DIAGNOSIS — B029 Zoster without complications: Secondary | ICD-10-CM

## 2015-04-13 DIAGNOSIS — E119 Type 2 diabetes mellitus without complications: Secondary | ICD-10-CM

## 2015-04-13 DIAGNOSIS — Z21 Asymptomatic human immunodeficiency virus [HIV] infection status: Secondary | ICD-10-CM | POA: Diagnosis not present

## 2015-04-13 MED ORDER — IBUPROFEN 800 MG PO TABS: 800.0000 mg | ORAL_TABLET | Freq: Three times a day (TID) | ORAL | Status: DC | PRN

## 2015-04-13 MED ORDER — GABAPENTIN 300 MG PO CAPS: 600.0000 mg | ORAL_CAPSULE | Freq: Three times a day (TID) | ORAL | Status: DC

## 2015-04-13 MED ORDER — ACYCLOVIR 800 MG PO TABS: 800.0000 mg | ORAL_TABLET | Freq: Every day | ORAL | Status: DC

## 2015-04-13 NOTE — Progress Notes (Signed)
Internal Medicine Clinic Attending  I saw and evaluated the patient.  I personally confirmed the key portions of the history and exam documented by Dr. Rabbani and I reviewed pertinent patient test results.  The assessment, diagnosis, and plan were formulated together and I agree with the documentation in the resident's note. 

## 2015-04-13 NOTE — Assessment & Plan Note (Signed)
Assessment: Pt with well-controlled hypertension compliant with three-class (diuretic, ARB, & CCB) anti-hypertensive therapy who presents with blood pressure of 139/56.   Plan:  -BP 139/56 at goal <140/90 -Continue triamterene-HCTZ 75-50 mg daily, verapamil 240 mg daily, and losartan 50 mg daily   -Last CMP on 09/22/14, repeat at next visit

## 2015-04-13 NOTE — Assessment & Plan Note (Addendum)
Assessment: Pt is immunocompromised (HIV & DM) with 4-day history of vesicular rash on left lower back and groin area most likely due to shingles in multi-dermatomal distribution who presents with mild neuropathic pain.  Plan:   -Prescribe acyclovir 800 mg fives times daily for 7 days  -Increase gabapentin from 300 mg TID to 600 TID  -Prescribe ibuprofen 800 mg Q 8 hr PRN pain -Pt given work Surveyor, quantity and instructed to avoid going in public places as he has active lesions -Pt to return after rash starts to improve to determine when he should return to work

## 2015-04-13 NOTE — Patient Instructions (Signed)
-  Start taking acyclovir 800 mg five times daily for 7 days -Increase your gabapentin from 300 mg three times daily to 600 mg three times daily  -Take ibuprofen 800 mg every 8 hrs as needed for pain -Please come back once your rash starts crusting over -Very nice meeting you!   General Instructions:   Please bring your medicines with you each time you come to clinic.  Medicines may include prescription medications, over-the-counter medications, herbal remedies, eye drops, vitamins, or other pills.   Progress Toward Treatment Goals:  Treatment Goal 03/04/2015  Hemoglobin A1C deteriorated  Blood pressure at goal    Self Care Goals & Plans:  Self Care Goal 04/13/2015  Manage my medications take my medicines as prescribed; bring my medications to every visit; refill my medications on time; follow the sick day instructions if I am sick  Monitor my health keep track of my blood glucose; check my feet daily  Eat healthy foods eat more vegetables; eat fruit for snacks and desserts; eat baked foods instead of fried foods; eat foods that are low in salt; drink diet soda or water instead of juice or soda  Be physically active find an activity I enjoy    Home Blood Glucose Monitoring 03/04/2015  Check my blood sugar 3 times a day  When to check my blood sugar before meals     Care Management & Community Referrals:  Referral 07/14/2014  Referrals made for care management support none needed

## 2015-04-13 NOTE — Progress Notes (Signed)
Patient ID: Trevor Fischer, male   DOB: [birth year], 54 y.o.   MRN: [id number]    Subjective:   Patient ID: Trevor Fischer male   DOB: 08/30/1961 54 y.o.   MRN: [id number]  HPI: Trevor Fischer is a 54 y.o. pleasant man with past medical history of HIV (last CD4 750), DVT, insulin-dependent Type 2 DM with neuropathy, hyperlipidemia, hypertension, and GERD who presents chief complaint of rash.   He reports feeling his normal self until four days ago when he was mowing his grass and then went to take a shower when he then broke out in a vesicular-type rash on his left groin area and left lower back which has been spreading and worsening with mild burning pain, tingling, and itching. He reports having history of rash with poison ivy exposure in the past but the rash is not similar. He was wearing long pants and pulling weeds in his yard. About two days prior to working in the yard a [employer] had sprayed plants with pesticide. He denies new exposure to food, soap, or insect bites. He denies mouth sores. The vesicles have not drained or crusted over and has been using hydrocortisone cream and caladryl lotion with no relief. He is on gabapentin 300 mg TID for chronic peripheral neuropathy. He reports feeling tired with body aches and flu-like symptoms since the rash started. He had chicken pox as a child but denies history of shingles or HSV in the past. 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  He is compliant with taking cozaar, verapamil, and maxide for hypertension. He denies blurry vision, headache, chest pain, or LE from baseline.    Past Medical History  Diagnosis Date  . 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 Hyperlipidemia   . Degenerative joint disease of knee   . HIV infection   . Hypertension   . GERD (gastroesophageal reflux disease)   . Hx MRSA infection   . Allergic rhinitis   . Superficial thrombophlebitis   . Diabetes  mellitus 9/09  . DVT (deep venous thrombosis)     Ended anticoagulation in 2012   Current Outpatient Prescriptions  Medication Sig Dispense Refill  . Alcohol Swabs (ALCOHOL PREPS) PADS 1 each by Does not apply route 3 (three) times daily. Use up to 8 times daily to check blood sugar and inject insulin into the skin. diag code E11.40. Insulin dependent 300 each 6  . aspirin EC 81 MG tablet Take 81 mg by mouth daily.    Marland Kitchen atorvastatin (LIPITOR) 40 MG tablet Take 1 tablet (40 mg total) by mouth daily. 30 tablet 11  . chlorpheniramine (CHLOR-TRIMETON) 4 MG tablet Take 4 mg by mouth every 4 (four) hours as needed for allergies.     . COMPLERA 200-25-300 MG TABS TAKE 1 TABLET BY MOUTH DAILY. 90 tablet PRN  . gabapentin (NEURONTIN) 300 MG capsule Take 1 capsule (300 mg total) by mouth 3 (three) times daily. 90 capsule 2  . glucose blood (TRUETEST TEST) test strip Use as instructed 100 each 12  . Insulin Glargine (LANTUS SOLOSTAR) 100 UNIT/ML Solostar Pen Inject 23 Units into the skin daily at 10 pm. 45 mL 3  . Insulin Pen Needle (UNIFINE PENTIPS) 31G X 8 MM MISC USE TO INJECT INSULIN 4 TIMES DAILY 100 each 11  . Lactobacillus (ACIDOPHILUS PROBIOTIC) 10 MG TABS Take 10 mg by mouth 3 (three) times daily. (Patient taking differently: Take  10 mg by mouth daily. ) 90 tablet 3  . losartan (COZAAR) 50 MG tablet TAKE 1 TABLET BY MOUTH DAILY. 60 tablet 0  . Multiple Vitamins-Minerals (MULTIVITAMIN WITH MINERALS) tablet Take 1 tablet by mouth daily.    Marland Kitchen NOVOLOG FLEXPEN 100 UNIT/ML FlexPen INJECT 5 UNITS INTO THE SKIN 4 TIMES DAILY - BEFORE MEALS AND AT BEDTIME. 15 mL 5  . Omega-3 Fatty Acids (FISH OIL) 1000 MG CAPS Take 1 capsule by mouth 2 (two) times daily.     Marland Kitchen triamterene-hydrochlorothiazide (MAXZIDE) 75-50 MG per tablet TAKE 1 TABLET BY MOUTH ONCE DAILY 45 tablet PRN  . TRUEPLUS LANCETS 33G MISC 1 Units by Does not apply route 3 (three) times daily.    . verapamil (CALAN-SR) 240 MG CR tablet TAKE 1  TABLET BY MOUTH ONCE DAILY 30 tablet 3   No current facility-administered medications for this visit.   Family History  Problem Relation Age of Onset  . Diabetes Father   . Kidney disease Father   . Heart failure Father   . Hyperlipidemia Father   . Hypertension Father   . Osteoarthritis Mother    History   Social History  . Marital Status: Significant Other    Spouse Name: N/A  . Number of Children: N/A  . Years of Education: N/A   Social History Main Topics  . Smoking status: Former Smoker -- 20 years    Types: Cigarettes    Quit date: 07/02/2005  . Smokeless tobacco: Never Used  . Alcohol Use: Not on file  . Drug Use: No  . Sexual Activity: Not on file     Comment: declined condoms   Other Topics Concern  . None   Social History Narrative   Lives in [place], with partner of 74 years    Works as Chartered certified accountant at [employer]   Has [employer]   Review of Systems: Review of Systems  Constitutional: Positive for malaise/fatigue. Negative for fever and chills.  HENT:       No mouth ulcers  Eyes: Negative for blurred vision.  Respiratory: Negative for cough, shortness of breath and wheezing.   Cardiovascular: Positive for leg swelling (trace b/l LE). Negative for chest pain.  Gastrointestinal: Negative for nausea, vomiting, diarrhea and constipation.  Genitourinary: Negative for dysuria, urgency and frequency.  Musculoskeletal:       Body aches  Skin: Positive for rash.  Neurological: Positive for tingling (with rash), sensory change (chronic peripheral neuropathy) and weakness. Negative for dizziness and headaches.     Objective:  Physical Exam: Filed Vitals:   04/13/15 1052 04/13/15 1053  BP:  139/56  Pulse:  62  Temp:  98.2 F (36.8 C)  TempSrc:  Oral  Height: 6\' 2"  (1.88 m)   Weight: 215 lb 8 oz (97.75 kg)   SpO2:  99%    Physical Exam  Constitutional: He is oriented to person, place, and time. He appears well-developed and  well-nourished. No distress.  HENT:  Head: Normocephalic and atraumatic.  Right Ear: External ear normal.  Left Ear: External ear normal.  Nose: Nose normal.  Mouth/Throat: Oropharynx is clear and moist. No oropharyngeal exudate.  No ulcers  Eyes: Conjunctivae and EOM are normal. Pupils are equal, round, and reactive to light. Right eye exhibits no discharge. Left eye exhibits no discharge. No scleral icterus.  Neck: Normal range of motion. Neck supple.  Cardiovascular: Normal rate, regular rhythm and normal heart sounds.   No murmur heard. Pulmonary/Chest: Effort  normal and breath sounds normal. No respiratory distress. He has no wheezes. He has no rales.  Abdominal: Soft. Bowel sounds are normal. He exhibits no distension and no mass. There is no tenderness. There is no rebound and no guarding.  Musculoskeletal: Normal range of motion. He exhibits edema (trace b/l LE). He exhibits no tenderness.  Neurological: He is alert and oriented to person, place, and time.  Skin: Skin is warm and dry. Rash (Mulitdermatomal vesicular rash on left lower back and groin, please see images below.) noted. He is not diaphoretic. No erythema. No pallor.  Psychiatric: He has a normal mood and affect. His behavior is normal. Judgment and thought content normal.          Assessment & Plan:   Please see problem list for problem-based assessment and plan

## 2015-04-15 ENCOUNTER — Other Ambulatory Visit: Payer: Self-pay | Admitting: Internal Medicine

## 2015-04-15 ENCOUNTER — Telehealth: Payer: Self-pay | Admitting: Internal Medicine

## 2015-04-15 DIAGNOSIS — B029 Zoster without complications: Secondary | ICD-10-CM

## 2015-04-15 MED ORDER — TRAMADOL HCL 50 MG PO TABS: 50.0000 mg | ORAL_TABLET | Freq: Two times a day (BID) | ORAL | Status: DC | PRN

## 2015-04-15 NOTE — Telephone Encounter (Signed)
Rx called in 

## 2015-04-15 NOTE — Telephone Encounter (Signed)
Yes will order tramadol 50 mg BID PRN pain for him. He was having mild pain when I saw him but evidently now is much worse. Thanks Edd Fabian!  Dr. Naaman Plummer

## 2015-04-15 NOTE — Telephone Encounter (Signed)
I called the patient and he states the pain is terrible, can't sleep at night.Also painful during the day. He is taking IBU 800 mg every 8 hours and he increased Neurontin to 600 mg TID. I told him I would call in the Rx for Tramadol.  He has not taken that before. [facility], please enter into EPIC.

## 2015-04-15 NOTE — Telephone Encounter (Signed)
Patient spouse calling states that the medication he was given the other day for pain is not strong enough and wants to know if he can get something stronger.

## 2015-04-19 ENCOUNTER — Ambulatory Visit: Payer: 59 | Admitting: Pharmacist

## 2015-04-19 ENCOUNTER — Ambulatory Visit (INDEPENDENT_AMBULATORY_CARE_PROVIDER_SITE_OTHER): Payer: 59 | Admitting: Internal Medicine

## 2015-04-19 VITALS — BP 123/72 | HR 76 | Temp 98.6°F | Wt 215.0 lb

## 2015-04-19 DIAGNOSIS — B029 Zoster without complications: Secondary | ICD-10-CM | POA: Diagnosis not present

## 2015-04-19 DIAGNOSIS — Z21 Asymptomatic human immunodeficiency virus [HIV] infection status: Secondary | ICD-10-CM

## 2015-04-19 DIAGNOSIS — E119 Type 2 diabetes mellitus without complications: Secondary | ICD-10-CM | POA: Diagnosis not present

## 2015-04-19 DIAGNOSIS — I10 Essential (primary) hypertension: Secondary | ICD-10-CM | POA: Diagnosis not present

## 2015-04-19 DIAGNOSIS — M25571 Pain in right ankle and joints of right foot: Secondary | ICD-10-CM | POA: Diagnosis not present

## 2015-04-19 DIAGNOSIS — S82831A Other fracture of upper and lower end of right fibula, initial encounter for closed fracture: Secondary | ICD-10-CM | POA: Insufficient documentation

## 2015-04-19 DIAGNOSIS — Z87891 Personal history of nicotine dependence: Secondary | ICD-10-CM

## 2015-04-19 MED ORDER — TRAMADOL HCL 50 MG PO TABS: 50.0000 mg | ORAL_TABLET | Freq: Two times a day (BID) | ORAL | Status: DC | PRN

## 2015-04-19 MED ORDER — IBUPROFEN 800 MG PO TABS
800.0000 mg | ORAL_TABLET | Freq: Three times a day (TID) | ORAL | Status: DC | PRN
Start: 2015-04-19 — End: 2015-05-13

## 2015-04-19 NOTE — Progress Notes (Signed)
Internal Medicine Clinic Attending  Case discussed with Dr. Rabbani at the time of the visit.  We reviewed the resident's history and exam and pertinent patient test results.  I agree with the assessment, diagnosis, and plan of care documented in the resident's note.  

## 2015-04-19 NOTE — Progress Notes (Signed)
Patient ID: Trevor Fischer, male   DOB: [birth year], 54 y.o.   MRN: [id number]    Subjective:   Patient ID: Trevor Fischer male   DOB: 1960-11-15 54 y.o.   MRN: [id number]  HPI: Trevor Fischer is a 54 y.o. pleasant man with past medical history of HIV (last CD4 750), DVT, insulin-dependent Type 2 DM with neuropathy, hyperlipidemia, hypertension, and GERD who presents with chief complaint of right ankle pain and swelling.   He reports that 2 days ago he felt lightheaded with standing and "blacked out" for a few seconds per his husband. He then fell and twisted his right ankle with consequent lateral sided pain and swelling without fever, chills, erythema, or warmth. He is able to ambulate and bear weight with some limping. His swelling is improving after resting, elevating his leg, applying ice, and taking ibuprofen and tramadol for pain. He has had prior history of DVT but denies prior ankle fracture or injury. He no longer has lightheadedness with standing and has not recurrent blacking out episode.   At last visit on 04/13/15 he was found to have shingles of his left lower back and left LE affecting multiple dermatomes and instructed to take acyclovir for 7 days which he has completed. The rash has now crusted over and unclear if it drained fluid. His pain is well controlled with ibuprofen, tramadol, and gabapentin.   He is compliant with taking cozaar, verapamil, and maxide for hypertension. He denies blurry vision, headache, chest pain, or LE from baseline.     Past Medical History  Diagnosis Date  . 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 Hyperlipidemia   . Degenerative joint disease of knee   . HIV infection   . Hypertension   . GERD (gastroesophageal reflux disease)   . Hx MRSA infection   . Allergic rhinitis   . Superficial thrombophlebitis   . Diabetes mellitus 9/09  . DVT (deep venous thrombosis)     Ended anticoagulation in 2012   Current Outpatient  Prescriptions  Medication Sig Dispense Refill  . acyclovir (ZOVIRAX) 800 MG tablet Take 1 tablet (800 mg total) by mouth 5 (five) times daily. 35 tablet 0  . Alcohol Swabs (ALCOHOL PREPS) PADS 1 each by Does not apply route 3 (three) times daily. Use up to 8 times daily to check blood sugar and inject insulin into the skin. diag code E11.40. Insulin dependent 300 each 6  . aspirin EC 81 MG tablet Take 81 mg by mouth daily.    Marland Kitchen atorvastatin (LIPITOR) 40 MG tablet Take 1 tablet (40 mg total) by mouth daily. 30 tablet 11  . chlorpheniramine (CHLOR-TRIMETON) 4 MG tablet Take 4 mg by mouth every 4 (four) hours as needed for allergies.     . COMPLERA 200-25-300 MG TABS TAKE 1 TABLET BY MOUTH DAILY. 90 tablet PRN  . gabapentin (NEURONTIN) 300 MG capsule Take 2 capsules (600 mg total) by mouth 3 (three) times daily. 180 capsule 2  . glucose blood (TRUETEST TEST) test strip Use as instructed 100 each 12  . ibuprofen (ADVIL,MOTRIN) 800 MG tablet Take 1 tablet (800 mg total) by mouth every 8 (eight) hours as needed. 30 tablet 0  . Insulin Glargine (LANTUS SOLOSTAR) 100 UNIT/ML Solostar Pen Inject 23 Units into the skin daily at 10 pm. 45 mL 3  . Insulin Pen Needle (UNIFINE PENTIPS) 31G X 8 MM MISC USE TO INJECT INSULIN 4 TIMES DAILY 100 each 11  . Lactobacillus (  ACIDOPHILUS PROBIOTIC) 10 MG TABS Take 10 mg by mouth 3 (three) times daily. (Patient taking differently: Take 10 mg by mouth daily. ) 90 tablet 3  . losartan (COZAAR) 50 MG tablet TAKE 1 TABLET BY MOUTH DAILY. 60 tablet 0  . Multiple Vitamins-Minerals (MULTIVITAMIN WITH MINERALS) tablet Take 1 tablet by mouth daily.    Marland Kitchen NOVOLOG FLEXPEN 100 UNIT/ML FlexPen INJECT 5 UNITS INTO THE SKIN 4 TIMES DAILY - BEFORE MEALS AND AT BEDTIME. 15 mL 5  . Omega-3 Fatty Acids (FISH OIL) 1000 MG CAPS Take 1 capsule by mouth 2 (two) times daily.     . traMADol (ULTRAM) 50 MG tablet Take 1 tablet (50 mg total) by mouth every 12 (twelve) hours as needed. 30 tablet 0  .  triamterene-hydrochlorothiazide (MAXZIDE) 75-50 MG per tablet TAKE 1 TABLET BY MOUTH ONCE DAILY 45 tablet PRN  . TRUEPLUS LANCETS 33G MISC 1 Units by Does not apply route 3 (three) times daily.    . verapamil (CALAN-SR) 240 MG CR tablet TAKE 1 TABLET BY MOUTH ONCE DAILY 30 tablet 3   No current facility-administered medications for this visit.   Family History  Problem Relation Age of Onset  . Diabetes Father   . Kidney disease Father   . Heart failure Father   . Hyperlipidemia Father   . Hypertension Father   . Osteoarthritis Mother    History   Social History  . Marital Status: Significant Other    Spouse Name: N/A  . Number of Children: N/A  . Years of Education: N/A   Social History Main Topics  . Smoking status: Former Smoker -- 20 years    Types: Cigarettes    Quit date: 07/02/2005  . Smokeless tobacco: Never Used  . Alcohol Use: Not on file  . Drug Use: No  . Sexual Activity: Not on file     Comment: declined condoms   Other Topics Concern  . Not on file   Social History Narrative   Lives in [place], with partner of 41 years    Works as Chartered certified accountant at [employer]   Review of Systems: Review of Systems  Constitutional: Negative for fever and chills.  Eyes: Negative for blurred vision.  Respiratory: Negative for cough, shortness of breath and wheezing.   Cardiovascular: Positive for leg swelling (right LE after fall). Negative for chest pain.  Gastrointestinal: Positive for nausea and constipation. Negative for vomiting, abdominal pain and diarrhea.  Genitourinary: Negative for dysuria, urgency and frequency.  Musculoskeletal: Positive for joint pain (right ankle for 2 days since fall) and falls (2 days ago).  Skin: Positive for rash (shingles of left lower back and LE). Negative for itching.  Neurological: Positive for sensory change (chronic peripheral neuropathy ). Negative for dizziness and headaches.    Objective:    Physical Exam: Filed Vitals:   04/19/15 1202  BP: 123/72  Pulse: 76  Temp: 98.6 F (37 C)  Weight: 215 lb (97.523 kg)    Physical Exam  Constitutional: He is oriented to person, place, and time. He appears well-developed and well-nourished. No distress.  HENT:  Head: Normocephalic and atraumatic.  Mouth/Throat: No oropharyngeal exudate.  Eyes: EOM are normal.  Neck: Normal range of motion. Neck supple.  Cardiovascular: Normal rate, regular rhythm and normal heart sounds.   No murmur heard. Pulmonary/Chest: Effort normal and breath sounds normal. No respiratory distress. He has no wheezes. He has no rales.  Abdominal: Soft. Bowel  sounds are normal. He exhibits no distension. There is no tenderness. There is no rebound and no guarding.  Musculoskeletal: He exhibits edema (right ankle) and tenderness (mild lateral right ankle tenderness).  Neurological: He is alert and oriented to person, place, and time.  Skin: Skin is warm and dry. Rash (Crusted over shingles lesions on left lower back and LE, please see pictures below ) noted. He is not diaphoretic. No erythema. No pallor.  Psychiatric: He has a normal mood and affect. His behavior is normal. Judgment and thought content normal.        Assessment & Plan:   Please see problem list for problem-based assessment plan

## 2015-04-19 NOTE — Assessment & Plan Note (Signed)
Assessment: Pt is immunocompromised (HIV & DM) with recent shingles infection affecting left lower back and left LE in multi-dermatomal distribution who presents with improved rash after anti-viral therapy and well-controlled.   Plan:  -Continue gabapentin from 600 TID for neuropathic pain  -Refill ibuprofen 800 mg Q 8 hr PRN pain and tramadol 50 mg BID PRN pain  -Pt to return in 1 week for work clearance

## 2015-04-19 NOTE — Assessment & Plan Note (Signed)
Assessment: Pt with two-day history of acute lateral right ankle pain and swelling with normal weight bearing in setting of recent fall most likely due to ankle sprain.    Plan:  -No indication for xray imaging at this time based on [place] rules  -Pt instructed to rest, limit weight bearing, ice, elevate, and wear brace/boot  -Continue ibuprofen 800 Q 8 PRN pain/inflammation and tramadol 50 mg BID PRN pain  -Pt instructed to return in 1 week for follow-up

## 2015-04-19 NOTE — Patient Instructions (Addendum)
-Rest, ice, elevate, and put a boot or brace on your right foot. Try to not put weight on it. Take ibuprofen and tramadol as needed for pain.  -I refilled your tramadol and ibuprofen, glad your rash is better -Please come back in 1 week, if your foot is not better we may need to get an xray -Nice seeing you again!  Ankle Sprain An ankle sprain is an injury to the strong, fibrous tissues (ligaments) that hold the bones of your ankle joint together.  CAUSES An ankle sprain is usually caused by a fall or by twisting your ankle. Ankle sprains most commonly occur when you step on the outer edge of your foot, and your ankle turns inward. People who participate in sports are more prone to these types of injuries.  SYMPTOMS   Pain in your ankle. The pain may be present at rest or only when you are trying to stand or walk.  Swelling.  Bruising. Bruising may develop immediately or within 1 to 2 days after your injury.  Difficulty standing or walking, particularly when turning corners or changing directions. DIAGNOSIS  Your caregiver will ask you details about your injury and perform a physical exam of your ankle to determine if you have an ankle sprain. During the physical exam, your caregiver will press on and apply pressure to specific areas of your foot and ankle. Your caregiver will try to move your ankle in certain ways. An X-ray exam may be done to be sure a bone was not broken or a ligament did not separate from one of the bones in your ankle (avulsion fracture).  TREATMENT  Certain types of braces can help stabilize your ankle. Your caregiver can make a recommendation for this. Your caregiver may recommend the use of medicine for pain. If your sprain is severe, your caregiver may refer you to a surgeon who helps to restore function to parts of your skeletal system (orthopedist) or a physical therapist. [place] ice to your injury for 1-2 days or as directed by your  caregiver. Applying ice helps to reduce inflammation and pain.  Put ice in a plastic bag.  Place a towel between your skin and the bag.  Leave the ice on for 15-20 minutes at a time, every 2 hours while you are awake.  Only take over-the-counter or prescription medicines for pain, discomfort, or fever as directed by your caregiver.  Elevate your injured ankle above the level of your heart as much as possible for 2-3 days.  If your caregiver recommends crutches, use them as instructed. Gradually put weight on the affected ankle. Continue to use crutches or a cane until you can walk without feeling pain in your ankle.  If you have a plaster splint, wear the splint as directed by your caregiver. Do not rest it on anything harder than a pillow for the first 24 hours. Do not put weight on it. Do not get it wet. You may take it off to take a shower or bath.  You may have been given an elastic bandage to wear around your ankle to provide support. If the elastic bandage is too tight (you have numbness or tingling in your foot or your foot becomes cold and blue), adjust the bandage to make it comfortable.  If you have an air splint, you may blow more air into it or let air out to make it more comfortable. You may take your splint off at night and before  taking a shower or bath. Wiggle your toes in the splint several times per day to decrease swelling. SEEK MEDICAL CARE IF:   You have rapidly increasing bruising or swelling.  Your toes feel extremely cold or you lose feeling in your foot.  Your pain is not relieved with medicine. SEEK IMMEDIATE MEDICAL CARE IF:  Your toes are numb or blue.  You have severe pain that is increasing. MAKE SURE YOU:   Understand these instructions.  Will watch your condition.  Will get help right away if you are not doing well or get worse. Document Released: 09/18/2005 Document Revised: 06/12/2012 Document Reviewed: 09/30/2011 [facility] Patient Information  2015 [place], [place]. This information is not intended to replace advice given to you by your health care provider. Make sure you discuss any questions you have with your health care provider.   General Instructions:   Please bring your medicines with you each time you come to clinic.  Medicines may include prescription medications, over-the-counter medications, herbal remedies, eye drops, vitamins, or other pills.   Progress Toward Treatment Goals:  Treatment Goal 03/04/2015  Hemoglobin A1C deteriorated  Blood pressure at goal    Self Care Goals & Plans:  Self Care Goal 04/13/2015  Manage my medications take my medicines as prescribed; bring my medications to every visit; refill my medications on time; follow the sick day instructions if I am sick  Monitor my health keep track of my blood glucose; check my feet daily  Eat healthy foods eat more vegetables; eat fruit for snacks and desserts; eat baked foods instead of fried foods; eat foods that are low in salt; drink diet soda or water instead of juice or soda  Be physically active find an activity I enjoy    Home Blood Glucose Monitoring 03/04/2015  Check my blood sugar 3 times a day  When to check my blood sugar before meals     Care Management & Community Referrals:  Referral 07/14/2014  Referrals made for care management support none needed

## 2015-04-19 NOTE — Assessment & Plan Note (Signed)
Assessment: Pt with well-controlled hypertension compliant with three-class (diuretic, ARB, & CCB) anti-hypertensive therapy who presents with blood pressure of 123/72.   Plan:  -BP 123/72 at goal <140/90 -Continue triamterene-HCTZ 75-50 mg daily, verapamil 240 mg daily, and losartan 50 mg daily  -Last CMP on 09/22/14, repeat at next visit

## 2015-04-26 ENCOUNTER — Ambulatory Visit (INDEPENDENT_AMBULATORY_CARE_PROVIDER_SITE_OTHER): Payer: 59 | Admitting: Internal Medicine

## 2015-04-26 ENCOUNTER — Encounter: Payer: Self-pay | Admitting: Internal Medicine

## 2015-04-26 ENCOUNTER — Ambulatory Visit (HOSPITAL_COMMUNITY)
Admission: RE | Admit: 2015-04-26 | Discharge: 2015-04-26 | Disposition: A | Discharge status: Home / Self Care | Payer: 59 | Source: Ambulatory Visit | Attending: Internal Medicine | Admitting: Internal Medicine

## 2015-04-26 VITALS — BP 141/74 | HR 67 | Temp 98.5°F | Ht 74.0 in | Wt 213.7 lb

## 2015-04-26 DIAGNOSIS — S82491A Other fracture of shaft of right fibula, initial encounter for closed fracture: Secondary | ICD-10-CM

## 2015-04-26 DIAGNOSIS — M21961 Unspecified acquired deformity of right lower leg: Secondary | ICD-10-CM | POA: Diagnosis not present

## 2015-04-26 DIAGNOSIS — M25571 Pain in right ankle and joints of right foot: Secondary | ICD-10-CM

## 2015-04-26 DIAGNOSIS — I10 Essential (primary) hypertension: Secondary | ICD-10-CM

## 2015-04-26 DIAGNOSIS — E119 Type 2 diabetes mellitus without complications: Secondary | ICD-10-CM

## 2015-04-26 DIAGNOSIS — W19XXXA Unspecified fall, initial encounter: Secondary | ICD-10-CM | POA: Diagnosis not present

## 2015-04-26 DIAGNOSIS — Z21 Asymptomatic human immunodeficiency virus [HIV] infection status: Secondary | ICD-10-CM | POA: Diagnosis not present

## 2015-04-26 DIAGNOSIS — B029 Zoster without complications: Secondary | ICD-10-CM | POA: Diagnosis not present

## 2015-04-26 DIAGNOSIS — S82831A Other fracture of upper and lower end of right fibula, initial encounter for closed fracture: Secondary | ICD-10-CM

## 2015-04-26 NOTE — Assessment & Plan Note (Signed)
Assessment: Pt with 9-day history of acute lateral right ankle pain and swelling in setting of recent fall found to have acute right distal nondisplaced fibular fracture.    Plan:  -Obtain complete xray of right ankle ---> acute right distal nondisplaced fibular fracture    -Pt instructed to limit weight bearing, ice, elevate, and wear boot  -Continue ibuprofen 800 Q 8 PRN pain/inflammation and tramadol 50 mg BID PRN pain

## 2015-04-26 NOTE — Assessment & Plan Note (Signed)
Assessment: Pt with well-controlled hypertension compliant with three-class (diuretic, ARB, & CCB) anti-hypertensive therapy who presents with blood pressure of 141/74 in setting of pain.   Plan:  -BP 141/74 near goal <140/90 in setting of pain -Continue triamterene-HCTZ 75-50 mg daily, verapamil 240 mg daily, and losartan 50 mg daily  -Last CMP on 09/22/14, repeat at next visit

## 2015-04-26 NOTE — Progress Notes (Signed)
Patient ID: Trevor Fischer, male   DOB: 1961/05/19, 54 y.o.   MRN: [id number]    Subjective:   Patient ID: Trevor Fischer male   DOB: 01-01-1961 54 y.o.   MRN: [id number]  HPI: [name] is a 54 y.o. pleasant man with past medical history of HIV (last CD4 750), DVT, insulin-dependent Type 2 DM with neuropathy, hyperlipidemia, hypertension, and GERD who presents for return of work [employer].   He was found to have shingles on 04/13/15 of his left lower back and left LE affecting multiple dermatomes and completed acyclovir for 7 days with well-healing crusted lesions. He has well-controlled pain (4/10) which he describes as tingling that is controlled with gabapentin, tramadol, and ibuprofen. He has mild itching of the lesions.   At last visit on 04/20/15 he had right ankle pain and swelling after falling due to blacking out. He has been wrapping it up, using a boot, applying ice, and using ibuprofen and tramadol with improved pain (4/10) and swelling. He continues to limp but is able to bear weight on his right lower extremity. He has had prior history of DVT but denies prior ankle fracture or injury. He denies any further blacking out episodes.    He is compliant with taking cozaar, verapamil, and maxide for hypertension. He denies blurry vision, headache, chest pain, lightheadedness, or LE from baseline.     Past Medical History  Diagnosis Date  . 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 Hyperlipidemia   . Degenerative joint disease of knee   . HIV infection   . Hypertension   . GERD (gastroesophageal reflux disease)   . Hx MRSA infection   . Allergic rhinitis   . Superficial thrombophlebitis   . Diabetes mellitus 9/09  . DVT (deep venous thrombosis)     Ended anticoagulation in 2012   Current Outpatient Prescriptions  Medication Sig Dispense Refill  . acyclovir (ZOVIRAX) 800 MG tablet Take 1 tablet (800 mg total) by mouth 5 (five) times daily. 35 tablet 0    . Alcohol Swabs (ALCOHOL PREPS) PADS 1 each by Does not apply route 3 (three) times daily. Use up to 8 times daily to check blood sugar and inject insulin into the skin. diag code E11.40. Insulin dependent 300 each 6  . aspirin EC 81 MG tablet Take 81 mg by mouth daily.    Marland Kitchen atorvastatin (LIPITOR) 40 MG tablet Take 1 tablet (40 mg total) by mouth daily. 30 tablet 11  . chlorpheniramine (CHLOR-TRIMETON) 4 MG tablet Take 4 mg by mouth every 4 (four) hours as needed for allergies.     . COMPLERA 200-25-300 MG TABS TAKE 1 TABLET BY MOUTH DAILY. 90 tablet PRN  . gabapentin (NEURONTIN) 300 MG capsule Take 2 capsules (600 mg total) by mouth 3 (three) times daily. 180 capsule 2  . glucose blood (TRUETEST TEST) test strip Use as instructed 100 each 12  . ibuprofen (ADVIL,MOTRIN) 800 MG tablet Take 1 tablet (800 mg total) by mouth every 8 (eight) hours as needed. 30 tablet 0  . Insulin Glargine (LANTUS SOLOSTAR) 100 UNIT/ML Solostar Pen Inject 23 Units into the skin daily at 10 pm. 45 mL 3  . Insulin Pen Needle (UNIFINE PENTIPS) 31G X 8 MM MISC USE TO INJECT INSULIN 4 TIMES DAILY 100 each 11  . Lactobacillus (ACIDOPHILUS PROBIOTIC) 10 MG TABS Take 10 mg by mouth 3 (three) times daily. (Patient taking differently: Take 10 mg by mouth daily. ) 90 tablet  3  . losartan (COZAAR) 50 MG tablet TAKE 1 TABLET BY MOUTH DAILY. 60 tablet 0  . Multiple Vitamins-Minerals (MULTIVITAMIN WITH MINERALS) tablet Take 1 tablet by mouth daily.    Marland Kitchen NOVOLOG FLEXPEN 100 UNIT/ML FlexPen INJECT 5 UNITS INTO THE SKIN 4 TIMES DAILY - BEFORE MEALS AND AT BEDTIME. 15 mL 5  . Omega-3 Fatty Acids (FISH OIL) 1000 MG CAPS Take 1 capsule by mouth 2 (two) times daily.     . traMADol (ULTRAM) 50 MG tablet Take 1 tablet (50 mg total) by mouth every 12 (twelve) hours as needed. 30 tablet 0  . triamterene-hydrochlorothiazide (MAXZIDE) 75-50 MG per tablet TAKE 1 TABLET BY MOUTH ONCE DAILY 45 tablet PRN  . TRUEPLUS LANCETS 33G MISC 1 Units by Does  not apply route 3 (three) times daily.    . verapamil (CALAN-SR) 240 MG CR tablet TAKE 1 TABLET BY MOUTH ONCE DAILY 30 tablet 3   No current facility-administered medications for this visit.   Family History  Problem Relation Age of Onset  . Diabetes Father   . Kidney disease Father   . Heart failure Father   . Hyperlipidemia Father   . Hypertension Father   . Osteoarthritis Mother    History   Social History  . Marital Status: Significant Other    Spouse Name: N/A  . Number of Children: N/A  . Years of Education: N/A   Social History Main Topics  . Smoking status: Former Smoker -- 20 years    Types: Cigarettes    Quit date: 07/02/2005  . Smokeless tobacco: Never Used  . Alcohol Use: Not on file  . Drug Use: No  . Sexual Activity: Not on file     Comment: declined condoms   Other Topics Concern  . None   Social History Narrative   Lives in [place], with partner of 16 years    Works as Chartered certified accountant at [employer]   Has [employer]   Review of Systems: Review of Systems  Constitutional: Negative for fever and chills.  Eyes: Negative for blurred vision.  Respiratory: Negative for cough, shortness of breath and wheezing.   Cardiovascular: Positive for leg swelling (right LE after fall). Negative for chest pain.  Gastrointestinal: Negative for nausea, vomiting, abdominal pain, diarrhea and constipation.  Genitourinary: Negative for dysuria, urgency and frequency.  Musculoskeletal: Positive for joint pain (right ankle after fall) and falls (9 days ago).  Skin: Positive for rash (shingles on left lower back and left groin).  Neurological: Positive for sensory change. Negative for dizziness, focal weakness, loss of consciousness and headaches.     Objective:  Physical Exam: Filed Vitals:   04/26/15 1459  BP: 141/74  Pulse: 67  Temp: 98.5 F (36.9 C)  TempSrc: Oral  Height: 6\' 2"  (1.88 m)  Weight: 213 lb 11.2 oz (96.934 kg)  SpO2: 98%    Physical  Exam  Constitutional: He is oriented to person, place, and time. He appears well-developed and well-nourished. No distress.  HENT:  Head: Normocephalic and atraumatic.  Eyes: EOM are normal.  Neck: Normal range of motion. Neck supple.  Cardiovascular: Normal rate, regular rhythm and normal heart sounds.   Pulmonary/Chest: Effort normal and breath sounds normal. No respiratory distress. He has no wheezes. He has no rales.  Abdominal: Soft. Bowel sounds are normal. He exhibits no distension. There is no tenderness. There is no rebound and no guarding.  Musculoskeletal: Normal range of motion. He exhibits edema (right ankle)  and tenderness (mild lateral side of right ankle).  Limping  Neurological: He is alert and oriented to person, place, and time.  Skin: Skin is warm and dry. Rash (Crusted over shingles lesions on left lower back and LE, please see pictures below) noted. He is not diaphoretic. No erythema. No pallor.  Psychiatric: He has a normal mood and affect. His behavior is normal. Judgment and thought content normal.         Assessment & Plan:   Please see problem list for problem-based assessment and plan

## 2015-04-26 NOTE — Assessment & Plan Note (Addendum)
Assessment: Pt is immunocompromised (HIV & DM) with recent shingles infection on 04/13/15 affecting left lower back and left LE in multi-dermatomal distribution who presents with well-healed rash after anti-viral therapy and well-controlled neuropathic pain.   Plan:  -Continue gabapentin from 600 TID for neuropathic pain  -Continue ibuprofen 800 mg Q 8 hr PRN pain and tramadol 50 mg BID PRN pain  -Pt given letter to return to work on 05/01/15, pt is no longer contagious   -Faxed job/insurance paperwork for leave of absence since his diagnosis on 04/13/15

## 2015-04-26 NOTE — Patient Instructions (Addendum)
-  Will get an xray of your right ankle and call you with the results -Will give you a work letter and fax back the paperwork -Glad you are doing better!  General Instructions:   Please bring your medicines with you each time you come to clinic.  Medicines may include prescription medications, over-the-counter medications, herbal remedies, eye drops, vitamins, or other pills.   Progress Toward Treatment Goals:  Treatment Goal 03/04/2015  Hemoglobin A1C deteriorated  Blood pressure at goal    Self Care Goals & Plans:  Self Care Goal 04/26/2015  Manage my medications take my medicines as prescribed; bring my medications to every visit; refill my medications on time; follow the sick day instructions if I am sick  Monitor my health keep track of my blood glucose; check my feet daily  Eat healthy foods eat more vegetables; eat baked foods instead of fried foods; eat fruit for snacks and desserts; drink diet soda or water instead of juice or soda  Be physically active find an activity I enjoy    Home Blood Glucose Monitoring 03/04/2015  Check my blood sugar 3 times a day  When to check my blood sugar before meals     Care Management & Community Referrals:  Referral 07/14/2014  Referrals made for care management support none needed

## 2015-04-27 ENCOUNTER — Other Ambulatory Visit: Payer: Self-pay | Admitting: Infectious Disease

## 2015-04-30 NOTE — Progress Notes (Signed)
Internal Medicine Clinic Attending  Case discussed with Dr. Rabbani soon after the resident saw the patient.  We reviewed the resident's history and exam and pertinent patient test results.  I agree with the assessment, diagnosis, and plan of care documented in the resident's note.  

## 2015-05-03 ENCOUNTER — Ambulatory Visit: Payer: Self-pay | Admitting: Pharmacist

## 2015-05-03 ENCOUNTER — Ambulatory Visit (INDEPENDENT_AMBULATORY_CARE_PROVIDER_SITE_OTHER): Payer: Self-pay | Admitting: Family Medicine

## 2015-05-03 VITALS — BP 122/66 | Ht 74.0 in | Wt 210.6 lb

## 2015-05-03 DIAGNOSIS — E119 Type 2 diabetes mellitus without complications: Secondary | ICD-10-CM

## 2015-05-03 LAB — POCT GLYCOSYLATED HEMOGLOBIN (HGB A1C): Hemoglobin A1C: 7.9

## 2015-05-03 NOTE — Progress Notes (Signed)
Subjective:  Patient presents today for 2 month diabetes follow-up as part of the employer-sponsored [employer] to Well[employer] program.  Current diabetes regimen includes Lantus 23 units SQ once daily and Novolog 5 units SQ with meals and bedtime.  Patient also continues on daily ASA, ARB and statin.     Since patient's last visit with me he developed shingles and fractured his ankle. Right ankle is still swollen. Patient reports wearing the boot. He reports he is still in pain. He is taking ibuprofen and tramadol for the pain.   His next appointment to see his new PCP is next week. He requested that I check his A1C today.    Assessment/Plan:  Patient is a 54 y.o. male with DM 2. Most recent A1C was   7.9% which is at exceeding goal of less than 7%. Weight is decreased by 6 pounds from last visit with me. A1C is improving from previous A1C of 8.3%.   CBG Review: Patient is checking four times daily. CBG averages are roughly 20 points lower than at last visit to see me. This coincides with patient stating he is eating less and a 6 pound weight loss.   Fasting CBG is averaging 114; pre-meals 126. Denies hypoglycemia.   Low/High- 62/232  Lifestyle improvements:  Physical Activity-  Patient has not been exercising as much due to shingles and fractured ankle. He has still been cutting his grass a few times a week.   Nutrition-  He states that his appetite has been lower than usual. He is not getting second helpings. If he does snack he is eating fruit or vegetables.   One of his goals last time was to cut back on concentrated sweets- he has not been eating cookies at all since his last visit!  Follow up with me in 3 months.    Goals for Next Visit:  1. Once your ankle heals, start back with walking on your days off.      Next appointment to see me is: Monday October 31st at [facility]. Donneta Romberg, PharmD, BCPS, CDE [facility] to [place]  Coordinator [phone]

## 2015-05-04 ENCOUNTER — Other Ambulatory Visit: Payer: Self-pay | Admitting: *Deleted

## 2015-05-04 MED ORDER — LOSARTAN POTASSIUM 50 MG PO TABS: 50.0000 mg | ORAL_TABLET | Freq: Every day | ORAL | Status: DC

## 2015-05-13 ENCOUNTER — Ambulatory Visit (INDEPENDENT_AMBULATORY_CARE_PROVIDER_SITE_OTHER): Payer: 59 | Admitting: Internal Medicine

## 2015-05-13 ENCOUNTER — Ambulatory Visit (HOSPITAL_COMMUNITY)
Admission: RE | Admit: 2015-05-13 | Discharge: 2015-05-13 | Disposition: A | Discharge status: Home / Self Care | Payer: 59 | Source: Ambulatory Visit | Attending: Internal Medicine | Admitting: Internal Medicine

## 2015-05-13 ENCOUNTER — Encounter: Payer: Self-pay | Admitting: Internal Medicine

## 2015-05-13 VITALS — BP 138/71 | HR 67 | Temp 98.6°F | Wt 211.8 lb

## 2015-05-13 DIAGNOSIS — B029 Zoster without complications: Secondary | ICD-10-CM | POA: Diagnosis not present

## 2015-05-13 DIAGNOSIS — S82491A Other fracture of shaft of right fibula, initial encounter for closed fracture: Secondary | ICD-10-CM

## 2015-05-13 DIAGNOSIS — M25571 Pain in right ankle and joints of right foot: Secondary | ICD-10-CM

## 2015-05-13 DIAGNOSIS — X58XXXA Exposure to other specified factors, initial encounter: Secondary | ICD-10-CM

## 2015-05-13 DIAGNOSIS — R143 Flatulence: Secondary | ICD-10-CM

## 2015-05-13 DIAGNOSIS — S82831A Other fracture of upper and lower end of right fibula, initial encounter for closed fracture: Secondary | ICD-10-CM

## 2015-05-13 DIAGNOSIS — E785 Hyperlipidemia, unspecified: Secondary | ICD-10-CM

## 2015-05-13 DIAGNOSIS — Z21 Asymptomatic human immunodeficiency virus [HIV] infection status: Secondary | ICD-10-CM

## 2015-05-13 DIAGNOSIS — S82491D Other fracture of shaft of right fibula, subsequent encounter for closed fracture with routine healing: Secondary | ICD-10-CM | POA: Diagnosis present

## 2015-05-13 DIAGNOSIS — B20 Human immunodeficiency virus [HIV] disease: Secondary | ICD-10-CM

## 2015-05-13 DIAGNOSIS — X58XXXD Exposure to other specified factors, subsequent encounter: Secondary | ICD-10-CM | POA: Insufficient documentation

## 2015-05-13 DIAGNOSIS — Z794 Long term (current) use of insulin: Secondary | ICD-10-CM

## 2015-05-13 DIAGNOSIS — E1140 Type 2 diabetes mellitus with diabetic neuropathy, unspecified: Secondary | ICD-10-CM

## 2015-05-13 HISTORY — DX: Flatulence: R14.3

## 2015-05-13 MED ORDER — IBUPROFEN 800 MG PO TABS: 800.0000 mg | ORAL_TABLET | Freq: Three times a day (TID) | ORAL | Status: DC | PRN

## 2015-05-13 MED ORDER — INSULIN GLARGINE 100 UNIT/ML SOLOSTAR PEN: 23.0000 [IU] | PEN_INJECTOR | Freq: Every day | SUBCUTANEOUS | Status: DC

## 2015-05-13 MED ORDER — TRAMADOL HCL 50 MG PO TABS: 50.0000 mg | ORAL_TABLET | Freq: Two times a day (BID) | ORAL | Status: DC | PRN

## 2015-05-13 MED ORDER — ATORVASTATIN CALCIUM 40 MG PO TABS: 40.0000 mg | ORAL_TABLET | Freq: Every day | ORAL | Status: DC

## 2015-05-13 MED ORDER — SIMETHICONE 180 MG PO CAPS: 1.0000 | ORAL_CAPSULE | Freq: Three times a day (TID) | ORAL | Status: DC | PRN

## 2015-05-13 NOTE — Assessment & Plan Note (Addendum)
Decrease swelling and pain in right ankle continues to walk with boot immobilization. He uses a roller for assistance at work but performs house and yard chores on his feet. Moderate swelling persists at the ankle but is minimally tender and he is walking without much difficulty. To reevaluate this right fibular fracture with minimal displacement on 7/25 xray, repeat three-view x-ray was obtained showing increase in posterior displacement of the distal fragment. Patient was called and informed of radiographic findings and agreed with plan for orthopedic surgical evaluation as this now appears to be an unstable fracture.  -Referral to outpatient orthopedic surgery tomorrow at 8:30 AM -Patient recommended to avoid load bearing as much as possible during interval -Refill ibuprofen 800 mg 3 times a day -Refill tramadol 50 mg twice a day

## 2015-05-13 NOTE — Assessment & Plan Note (Signed)
Lab Results  Component Value Date   HGBA1C 7.9 05/03/2015   HGBA1C 8.5 02/15/2015   HGBA1C 7.4 11/05/2014     Assessment: Diabetes control: Fair control Progress toward A1C goal:  Improving Comments: Patient states she is now taking 23 units Lantus at bedtime and 5 units NovoLog plus insulin sliding scale nighttime coverage. His diet is improved with decreased sugary food intake.  Plan: Medications:  Continue current medications consider adjustment of insulin dose if A1c remains static or worsens Home glucose monitoring: Frequency: 3 times daily Timing: Before meals Instruction/counseling given: Recommended to continue frequent monitoring and bringing glucose meter to ppointments Educational resources provided:   Self management tools provided: copy of home glucose meter printout Other plans: Will refer to diabetes and nutrition educator when appropriate time has passed since last visit

## 2015-05-13 NOTE — Assessment & Plan Note (Signed)
Complains of flatulence worsened with increase in vegetables in diet. No improvement with over-the-counter treatment.  -Simethicone 180 mg 3 times daily after meals as needed for flatulence

## 2015-05-13 NOTE — Assessment & Plan Note (Signed)
She has recent shingles infection on 04/13/2015 affecting his left lower back and left thigh now showing flat lesions a well healing pattern. He is currently managing with gabapentin 600 3 times a day, and Benadryl as needed for pruritus. Continues to experience itchiness and neuralgia which is decreased overall. At this stage only symptomatically relief of resolving infection is appropriate.  -Counseled to avoid scratching one possible to avoid permanent excoriations and scarring -Continue gabapentin 600 mg 3 times a day -Continue Benadryl 25 mg daily at bedtime -Patient recommended to continue over-the-counter topical treatment or not as preferred

## 2015-05-13 NOTE — Patient Instructions (Signed)
Continue wearing boot to support your right ankle at all times until your orthopedic evaluation. Avoid walking except as needed. Ibuprofen and tramadol refilled for pain control during this time.  Avoid scratching at shingles site as much as possible since skin breaks may lead to permanent scarring. Most spontaneous lesions from shingles will resolve over time and do not scar unless agitated. Continue using topical cream for relief if is is beneficial, this is unlikely to affect long-term outcomes.  Continue good compliance with her diabetes plan. A1c is improved since previous visit so continue on current plan until rechecked again. If A1c is unchanged or worsens by that time we will adjust insulin dosing as appropriate.  Prescribe simethicone 180 mg capsules for use up to 3 times a day as needed after meals for flatulence or bloating. Discontinue medicine if causes significant abdominal pain or worsens discomfort.  Please follow up at infectious disease clinic as soon as possible for HIV monitoring and medication management.

## 2015-05-13 NOTE — Assessment & Plan Note (Addendum)
Patient is well compliant with complera and last CD4 count from 2015 was 750 with undetectable viral load. He is due for infectious disease clinic appointment and needs to schedule this as soon as possible. No other signs of complications with HIV management.  -Patient informed to follow up at infectious disease clinic when able

## 2015-05-13 NOTE — Assessment & Plan Note (Addendum)
Lipid Panel     Component Value Date/Time   CHOL 152 09/22/2014 1420   TRIG 62 09/22/2014 1420   HDL 36* 09/22/2014 1420   CHOLHDL 4.2 09/22/2014 1420   VLDL 12 09/22/2014 1420   LDLCALC 104* 09/22/2014 1420   Last lipid panel within past 12 months and patient is on moderate intensity statin therapy. LDL was previously at goal below 100 on 80 mg dose. Now greater than 100 on 40 mg dose. No adverse effects reported. Lipid panel may be worth rechecking at next health maintenance to decide moderate vs high dose.  -Lipid panel at next health maintenance visit -Adjust atorvastatin dosing as needed

## 2015-05-13 NOTE — Progress Notes (Signed)
Subjective:   Patient ID: Trevor Fischer male   DOB: March 12, 1961 54 y.o.   MRN: [id number]  HPI: Trevor Fischer is a 54 y.o. man with past medical history as described below visiting clinic for health maintenance, recent shingles infection, and follow-up for right fibular fracture. He was seen for similar complaints at [facility] on July 25. Shingles rash continues to heal but remains pruritic. He is ambulatory with boot immobilization on right lower extremity. He has had no other episodes of lightheadedness or falls.  See problem based assessment and plan for additional details.  Past Medical History  Diagnosis Date  . 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 Hyperlipidemia   . Degenerative joint disease of knee   . HIV infection   . Hypertension   . GERD (gastroesophageal reflux disease)   . Hx MRSA infection   . Allergic rhinitis   . Superficial thrombophlebitis   . Diabetes mellitus 9/09  . DVT (deep venous thrombosis)     Ended anticoagulation in 2012   Current Outpatient Prescriptions  Medication Sig Dispense Refill  . Alcohol Swabs (ALCOHOL PREPS) PADS 1 each by Does not apply route 3 (three) times daily. Use up to 8 times daily to check blood sugar and inject insulin into the skin. diag code E11.40. Insulin dependent 300 each 6  . aspirin EC 81 MG tablet Take 81 mg by mouth daily.    Marland Kitchen atorvastatin (LIPITOR) 40 MG tablet Take 1 tablet (40 mg total) by mouth daily. 30 tablet 11  . chlorpheniramine (CHLOR-TRIMETON) 4 MG tablet Take 4 mg by mouth every 4 (four) hours as needed for allergies.     . COMPLERA 200-25-300 MG TABS TAKE 1 TABLET BY MOUTH DAILY. 30 tablet 0  . gabapentin (NEURONTIN) 300 MG capsule Take 2 capsules (600 mg total) by mouth 3 (three) times daily. 180 capsule 2  . glucose blood (TRUETEST TEST) test strip Use as instructed 100 each 12  . ibuprofen (ADVIL,MOTRIN) 800 MG tablet Take 1 tablet (800 mg total) by mouth every 8 (eight) hours as needed.  30 tablet 0  . Insulin Glargine (LANTUS SOLOSTAR) 100 UNIT/ML Solostar Pen Inject 23 Units into the skin daily at 10 pm. 45 mL 3  . Insulin Pen Needle (UNIFINE PENTIPS) 31G X 8 MM MISC USE TO INJECT INSULIN 4 TIMES DAILY 100 each 11  . Lactobacillus (ACIDOPHILUS PROBIOTIC) 10 MG TABS Take 10 mg by mouth 3 (three) times daily. (Patient taking differently: Take 10 mg by mouth daily. ) 90 tablet 3  . losartan (COZAAR) 50 MG tablet Take 1 tablet (50 mg total) by mouth daily. 60 tablet 2  . Multiple Vitamins-Minerals (MULTIVITAMIN WITH MINERALS) tablet Take 1 tablet by mouth daily.    Marland Kitchen NOVOLOG FLEXPEN 100 UNIT/ML FlexPen INJECT 5 UNITS INTO THE SKIN 4 TIMES DAILY - BEFORE MEALS AND AT BEDTIME. 15 mL 5  . Omega-3 Fatty Acids (FISH OIL) 1000 MG CAPS Take 1 capsule by mouth 2 (two) times daily.     . Simethicone 180 MG CAPS Take 1 capsule (180 mg total) by mouth 3 (three) times daily with meals as needed. For flatulence, take after meal 90 capsule 5  . traMADol (ULTRAM) 50 MG tablet Take 1 tablet (50 mg total) by mouth every 12 (twelve) hours as needed. 30 tablet 0  . triamterene-hydrochlorothiazide (MAXZIDE) 75-50 MG per tablet TAKE 1 TABLET BY MOUTH ONCE DAILY (Patient taking differently: Patient states he is taking 1/2 tablet  daily) 45 tablet PRN  . TRUEPLUS LANCETS 33G MISC 1 Units by Does not apply route 3 (three) times daily.    . verapamil (CALAN-SR) 240 MG CR tablet TAKE 1 TABLET BY MOUTH ONCE DAILY 30 tablet 3   No current facility-administered medications for this visit.   Family History  Problem Relation Age of Onset  . Diabetes Father   . Kidney disease Father   . Heart failure Father   . Hyperlipidemia Father   . Hypertension Father   . Osteoarthritis Mother    Social History   Social History  . Marital Status: Significant Other    Spouse Name: N/A  . Number of Children: N/A  . Years of Education: N/A   Social History Main Topics  . Smoking status: Former Smoker -- 20 years     Types: Cigarettes    Quit date: 07/02/2005  . Smokeless tobacco: Never Used  . Alcohol Use: Not on file  . Drug Use: No  . Sexual Activity: Not on file     Comment: declined condoms   Other Topics Concern  . Not on file   Social History Narrative   Lives in [place], with partner of 40 years    Works as Chartered certified accountant at [employer]   Review of Systems: Review of Systems  Constitutional: Negative for weight loss.  Eyes: Negative for blurred vision and double vision.  Respiratory: Negative for cough and shortness of breath.   Cardiovascular: Positive for leg swelling. Negative for chest pain and palpitations.  Gastrointestinal: Negative for nausea, vomiting, diarrhea and constipation.       Flatulence  Genitourinary: Negative for dysuria and frequency.  Musculoskeletal:       Broken right ankle  Skin: Positive for itching and rash.  Neurological: Negative for dizziness, weakness and headaches.  Endo/Heme/Allergies: Negative for polydipsia.    Objective:  Physical Exam: Filed Vitals:   05/13/15 0836  BP: 138/71  Pulse: 67  Temp: 98.6 F (37 C)  TempSrc: Oral  Weight: 211 lb 12.8 oz (96.072 kg)  SpO2: 99%   GENERAL- pleasant, co-operative, NAD HEENT- Atraumatic, PERRL, EOMI, oral mucosa appears moist, good and intact dentition CARDIAC- RRR, no murmurs, rubs or gallops. RESP- CTAB, no wheezes or crackles. ABDOMEN- Soft, nontender, no guarding or rebound, normoactive bowel sounds present NEURO- Strength 5/5 throughout RLE, ankle jerk reflex 2+, normal sensation in R foot EXTREMITIES- DP pulse 2+, PT pulse poorly palpable 2/2 to swelling, swelling around right ankle, minimal tenderness to palpation, no pain on plantar or dorsiflexion Left lower extremity unremarkable on exam SKIN- flat well healing shingles rash without significant excoriations on lower back left greater than right and left inner thigh PSYCH- Normal mood and affect, appropriate  thought content and speech.  Assessment & Plan:

## 2015-05-14 NOTE — Progress Notes (Signed)
Internal Medicine Clinic Attending  I saw and evaluated the patient.  I personally confirmed the key portions of the history and exam documented by Dr. Rice and I reviewed pertinent patient test results.  The assessment, diagnosis, and plan were formulated together and I agree with the documentation in the resident's note.  

## 2015-05-15 NOTE — Progress Notes (Signed)
Patient ID: Trevor Fischer, male   DOB: 07/11/1961, 54 y.o.   MRN: [id number] ATTENDING PHYSICIAN NOTE: I have reviewed the chart and agree with the plan as detailed above. Dorcas Mcmurray MD Pager [phone]

## 2015-05-25 ENCOUNTER — Other Ambulatory Visit: Payer: Self-pay | Admitting: Internal Medicine

## 2015-05-25 NOTE — Telephone Encounter (Signed)
Called to pharm 

## 2015-06-09 ENCOUNTER — Telehealth: Payer: Self-pay | Admitting: Dietician

## 2015-06-09 NOTE — Telephone Encounter (Signed)
Call to patient to confirm appointment for 06/10/15 at 1:30 lmtcb

## 2015-06-10 ENCOUNTER — Other Ambulatory Visit: Payer: Self-pay | Admitting: *Deleted

## 2015-06-10 ENCOUNTER — Ambulatory Visit (INDEPENDENT_AMBULATORY_CARE_PROVIDER_SITE_OTHER): Payer: 59 | Admitting: Dietician

## 2015-06-10 VITALS — Wt 210.8 lb

## 2015-06-10 DIAGNOSIS — I10 Essential (primary) hypertension: Secondary | ICD-10-CM

## 2015-06-10 DIAGNOSIS — E119 Type 2 diabetes mellitus without complications: Secondary | ICD-10-CM | POA: Diagnosis not present

## 2015-06-10 DIAGNOSIS — Z794 Long term (current) use of insulin: Secondary | ICD-10-CM

## 2015-06-10 DIAGNOSIS — Z713 Dietary counseling and surveillance: Secondary | ICD-10-CM | POA: Diagnosis not present

## 2015-06-10 DIAGNOSIS — B20 Human immunodeficiency virus [HIV] disease: Secondary | ICD-10-CM

## 2015-06-10 DIAGNOSIS — E1140 Type 2 diabetes mellitus with diabetic neuropathy, unspecified: Secondary | ICD-10-CM

## 2015-06-10 MED ORDER — COMPLERA 200-25-300 MG PO TABS
1.0000 | ORAL_TABLET | Freq: Every day | ORAL | Status: DC
Start: 2015-06-10 — End: 2015-07-05

## 2015-06-10 MED ORDER — VERAPAMIL HCL ER 240 MG PO TBCR: 240.0000 mg | EXTENDED_RELEASE_TABLET | Freq: Every day | ORAL | Status: DC

## 2015-06-10 NOTE — Patient Instructions (Signed)
Great blood sugars and weight!!!  Follow up in 6 months to 12 months  Please get your flu shot and let us know when you have gotten it.

## 2015-06-11 ENCOUNTER — Encounter: Payer: Self-pay | Admitting: Dietician

## 2015-06-11 NOTE — Progress Notes (Signed)
Medical Nutrition Therapy:  Appt start time: 1100 end time:  1130.   Assessment:  Primary concerns today: glycemic control,weight, diabetes self care.  Trevor Fischer is here with supportive husband for his annual diabetes review. His left ankle is in a walking cast. He had shingles about 2 months ago which is healing. He is using the accu chek expert meter for bolus advice and says he really likes it. He has not been able to do much exercise in the past 2 months, but hopes to start walking more in the next few weekly. He is up to date on all his diabetes self care items. Check his feet daily  Preferred Learning Style: No preference indicated  Learning Readiness: Ready & Change in progress A1C was 7.9% in August, HDl low at 36 and LDL high at 104 Weight: 210# with BMI of 27 is reasonable,  MEDICATIONS: takes lantus 23 units daily, Novolog 1-3 times a day, 7-23 units/day BLOOD SUGAR:excellent control with an average of 112 for past 30 days, range is 67-173, only had 1 blood sugar of 67 and says he was not symptomatic,he was in teh 70s a few times and again this does not bother him DIETARY INTAKE: Usual eating pattern includes 3 meals and 1-3 snacks per day. Everyday foods include fruit, vegetables, wine.  .   24-hr recall:  B ( AM): egg and cheese on pita bread, OJ and cereal L ( PM):/D ( PM): salad from [employer] with fried chicken, [place] toast and water Snk ( PM): 4 oz red wine Beverages: water, red wine once daily   Estimated energy needs: You need 2,561 Calories/day to maintain your weight.  You need 2,061 Calories/day to lose 1 lb per week.  Progress Towards Goal(s):  In progress.   Nutritional Diagnosis:  NB-2.1 Physical inactivity As related to illness and broken ankle.  As evidenced by his report.    Intervention:  Nutrition education about diabetes self care. Importance of physical activity. Trevor Fischer is very good at self monitoring and self motivating. Therefore follow up as needed or in  6-12 months. Anticipate that his next A1C will be at goal < 7%.  Coordination of care- recommend shorter pen needles if patient agrees.  Teaching Method Utilized: Visual, Auditory Handouts given during visit include:AVS Barriers to learning/adherence to lifestyle change: current physical limitations Demonstrated degree of understanding via:  Teach Back   Monitoring/Evaluation:  Dietary intake, exercise, meter, and body weight in 6 month(s).

## 2015-06-21 ENCOUNTER — Other Ambulatory Visit: Payer: 59

## 2015-06-21 DIAGNOSIS — B20 Human immunodeficiency virus [HIV] disease: Secondary | ICD-10-CM

## 2015-06-21 DIAGNOSIS — N289 Disorder of kidney and ureter, unspecified: Secondary | ICD-10-CM

## 2015-06-21 LAB — COMPLETE METABOLIC PANEL WITH GFR
ALT: 51 U/L — AB (ref 9–46)
AST: 45 U/L — ABNORMAL HIGH (ref 10–35)
Albumin: 4.2 g/dL (ref 3.6–5.1)
Alkaline Phosphatase: 110 U/L (ref 40–115)
BUN: 23 mg/dL (ref 7–25)
CO2: 28 mmol/L (ref 20–31)
CREATININE: 1.07 mg/dL (ref 0.70–1.33)
Calcium: 8.9 mg/dL (ref 8.6–10.3)
Chloride: 99 mmol/L (ref 98–110)
GFR, Est African American: >89 mL/min (ref >=60)
GFR, Est Non African American: 78 mL/min (ref >=60)
Glucose, Bld: 209 mg/dL — ABNORMAL HIGH (ref 65–99)
POTASSIUM: 4.4 mmol/L (ref 3.5–5.3)
SODIUM: 136 mmol/L (ref 135–146)
Total Bilirubin: 0.4 mg/dL (ref 0.2–1.2)
Total Protein: 6.5 g/dL (ref 6.1–8.1)

## 2015-06-21 LAB — LIPID PANEL
Cholesterol: 126 mg/dL (ref 125–200)
HDL: 28 mg/dL — AB (ref >=40)
LDL Cholesterol: 60 mg/dL (ref <130)
Total CHOL/HDL Ratio: 4.5 Ratio (ref <=5.0)
Triglycerides: 191 mg/dL — ABNORMAL HIGH (ref <150)
VLDL: 38 mg/dL — AB (ref <30)

## 2015-06-21 LAB — CBC WITH DIFFERENTIAL/PLATELET
BASOS ABS: 0.1 10*3/uL (ref 0.0–0.1)
BASOS PCT: 1 % (ref 0–1)
EOS PCT: 13 % — AB (ref 0–5)
Eosinophils Absolute: 0.7 10*3/uL (ref 0.0–0.7)
HCT: 38.9 % — ABNORMAL LOW (ref 39.0–52.0)
Hemoglobin: 13.5 g/dL (ref 13.0–17.0)
LYMPHS PCT: 34 % (ref 12–46)
Lymphs Abs: 1.9 10*3/uL (ref 0.7–4.0)
MCH: 27.8 pg (ref 26.0–34.0)
MCHC: 34.7 g/dL (ref 30.0–36.0)
MCV: 80.2 fL (ref 78.0–100.0)
MPV: 8.8 fL (ref 8.6–12.4)
Monocytes Absolute: 0.6 10*3/uL (ref 0.1–1.0)
Monocytes Relative: 10 % (ref 3–12)
Neutro Abs: 2.3 10*3/uL (ref 1.7–7.7)
Neutrophils Relative %: 42 % — ABNORMAL LOW (ref 43–77)
PLATELETS: 244 10*3/uL (ref 150–400)
RBC: 4.85 MIL/uL (ref 4.22–5.81)
RDW: 14.1 % (ref 11.5–15.5)
WBC: 5.5 10*3/uL (ref 4.0–10.5)

## 2015-06-22 LAB — URINE CYTOLOGY ANCILLARY ONLY
Chlamydia: NEGATIVE
Neisseria Gonorrhea: NEGATIVE

## 2015-06-22 LAB — HIV-1 RNA QUANT-NO REFLEX-BLD

## 2015-06-22 LAB — MICROALBUMIN / CREATININE URINE RATIO
Creatinine, Urine: 87.5 mg/dL
Microalb Creat Ratio: 3.4 mg/g (ref 0.0–30.0)
Microalb, Ur: 0.3 mg/dL (ref <2.0)

## 2015-06-22 LAB — T-HELPER CELL (CD4) - (RCID CLINIC ONLY)
CD4 T CELL HELPER: 38 % (ref 33–55)
CD4 T Cell Abs: 670 /uL (ref 400–2700)

## 2015-06-22 LAB — RPR

## 2015-07-05 ENCOUNTER — Encounter: Payer: Self-pay | Admitting: Infectious Disease

## 2015-07-05 ENCOUNTER — Ambulatory Visit (INDEPENDENT_AMBULATORY_CARE_PROVIDER_SITE_OTHER): Payer: 59 | Admitting: Infectious Disease

## 2015-07-05 VITALS — BP 111/74 | HR 64 | Temp 98.1°F | Ht 74.0 in | Wt 214.0 lb

## 2015-07-05 DIAGNOSIS — S82491A Other fracture of shaft of right fibula, initial encounter for closed fracture: Secondary | ICD-10-CM

## 2015-07-05 DIAGNOSIS — R7401 Elevation of levels of liver transaminase levels: Secondary | ICD-10-CM

## 2015-07-05 DIAGNOSIS — B20 Human immunodeficiency virus [HIV] disease: Secondary | ICD-10-CM | POA: Diagnosis not present

## 2015-07-05 DIAGNOSIS — E0840 Diabetes mellitus due to underlying condition with diabetic neuropathy, unspecified: Secondary | ICD-10-CM | POA: Diagnosis not present

## 2015-07-05 DIAGNOSIS — S82831A Other fracture of upper and lower end of right fibula, initial encounter for closed fracture: Secondary | ICD-10-CM

## 2015-07-05 DIAGNOSIS — Z23 Encounter for immunization: Secondary | ICD-10-CM | POA: Diagnosis not present

## 2015-07-05 DIAGNOSIS — E785 Hyperlipidemia, unspecified: Secondary | ICD-10-CM

## 2015-07-05 DIAGNOSIS — B029 Zoster without complications: Secondary | ICD-10-CM | POA: Diagnosis not present

## 2015-07-05 DIAGNOSIS — R740 Nonspecific elevation of levels of transaminase and lactic acid dehydrogenase [LDH]: Secondary | ICD-10-CM

## 2015-07-05 MED ORDER — EMTRICITAB-RILPIVIR-TENOFOV AF 200-25-25 MG PO TABS: 1.0000 | ORAL_TABLET | Freq: Every day | ORAL | Status: DC

## 2015-07-05 NOTE — Progress Notes (Signed)
Chief complaint: still with pain from recent shingles outbreak and broken ankle Subjective:    Patient ID: Trevor Fischer, male    DOB: [birth year], 54 y.o.   MRN: [id number]  HPI  Trevor Fischer is a 55 y.o. male who is doing superbly well on his  antiviral regimen complera with undetectable viral load and health cd4 count. Both he and his husband are here today again.  Naeem had developed painful shingles outbreak involving lower dermatomes and was at home and took ibuprofen, ultram and lost consciousness and fell breaking his fibula which was subsequently splinted.   He has had a rough summer but now is in better spirits.   We reviewed all of his HIV and routine labs. He does still have elevation in transaminases present previously  Past Medical History  Diagnosis Date  . 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 Hyperlipidemia   . Degenerative joint disease of knee   . HIV infection ([place])   . Hypertension   . GERD (gastroesophageal reflux disease)   . Hx MRSA infection   . Allergic rhinitis   . Superficial thrombophlebitis   . Diabetes mellitus 9/09  . DVT (deep venous thrombosis) ([place])     Ended anticoagulation in 2012    Past Surgical History  Procedure Laterality Date  . Vein ligation and stripping    . Endovenous ablation saphenous vein w/ laser  07-17-2011 LEFT GRERATER SAPHENOUS VEIN AND STAB PHLEBECTOMIES   10-20   LEFT LEG    Family History  Problem Relation Age of Onset  . Diabetes Father   . Kidney disease Father   . Heart failure Father   . Hyperlipidemia Father   . Hypertension Father   . Osteoarthritis Mother       Social History   Social History  . Marital Status: Significant Other    Spouse Name: N/A  . Number of Children: N/A  . Years of Education: N/A   Social History Main Topics  . Smoking status: Former Smoker -- 20 years    Types: Cigarettes    Quit date: 07/02/2005  . Smokeless tobacco: Never Used  . Alcohol Use:  4.2 oz/week    0 Standard drinks or equivalent, 7 Glasses of wine per week  . Drug Use: No  . Sexual Activity: Not Asked     Comment: declined condoms   Other Topics Concern  . None   Social History Narrative   Lives in [place], with partner of 26 years    Works as Chartered certified accountant at [employer]   Has [employer]    Allergies  Allergen Reactions  . Pepcid [Famotidine]     Co-administration will lower complera levels  . Protonix [Pantoprazole Sodium]     Will make complera levels subtherapeutic  . Amoxicillin-Pot Clavulanate     REACTION: rash     Current outpatient prescriptions:  .  Alcohol Swabs (ALCOHOL PREPS) PADS, 1 each by Does not apply route 3 (three) times daily. Use up to 8 times daily to check blood sugar and inject insulin into the skin. diag code E11.40. Insulin dependent, Disp: 300 each, Rfl: 6 .  aspirin EC 81 MG tablet, Take 81 mg by mouth daily., Disp: , Rfl:  .  atorvastatin (LIPITOR) 40 MG tablet, Take 1 tablet (40 mg total) by mouth daily., Disp: 30 tablet, Rfl: 11 .  chlorpheniramine (CHLOR-TRIMETON) 4 MG tablet, Take 4 mg by mouth every 4 (four) hours as needed for  allergies. , Disp: , Rfl:  .  gabapentin (NEURONTIN) 300 MG capsule, Take 2 capsules (600 mg total) by mouth 3 (three) times daily., Disp: 180 capsule, Rfl: 2 .  glucose blood (TRUETEST TEST) test strip, Use as instructed, Disp: 100 each, Rfl: 12 .  ibuprofen (ADVIL,MOTRIN) 800 MG tablet, Take 1 tablet (800 mg total) by mouth every 8 (eight) hours as needed., Disp: 30 tablet, Rfl: 0 .  Insulin Glargine (LANTUS SOLOSTAR) 100 UNIT/ML Solostar Pen, Inject 23 Units into the skin daily at 10 pm., Disp: 45 mL, Rfl: 3 .  Insulin Pen Needle (UNIFINE PENTIPS) 31G X 8 MM MISC, USE TO INJECT INSULIN 4 TIMES DAILY, Disp: 100 each, Rfl: 11 .  Lactobacillus (ACIDOPHILUS PROBIOTIC) 10 MG TABS, Take 10 mg by mouth 3 (three) times daily. (Patient taking differently: Take 10 mg by mouth daily. ), Disp: 90  tablet, Rfl: 3 .  losartan (COZAAR) 50 MG tablet, Take 1 tablet (50 mg total) by mouth daily., Disp: 60 tablet, Rfl: 2 .  Multiple Vitamins-Minerals (MULTIVITAMIN WITH MINERALS) tablet, Take 1 tablet by mouth daily., Disp: , Rfl:  .  NOVOLOG FLEXPEN 100 UNIT/ML FlexPen, INJECT 5 UNITS INTO THE SKIN 4 TIMES DAILY - BEFORE MEALS AND AT BEDTIME., Disp: 15 mL, Rfl: 5 .  Omega-3 Fatty Acids (FISH OIL) 1000 MG CAPS, Take 1 capsule by mouth 2 (two) times daily. , Disp: , Rfl:  .  Simethicone 180 MG CAPS, Take 1 capsule (180 mg total) by mouth 3 (three) times daily with meals as needed. For flatulence, take after meal, Disp: 90 capsule, Rfl: 5 .  traMADol (ULTRAM) 50 MG tablet, TAKE 1 TABLET BY MOUTH EVERY 12 HOURS AS NEEDED, Disp: 30 tablet, Rfl: 1 .  triamterene-hydrochlorothiazide (MAXZIDE) 75-50 MG per tablet, TAKE 1 TABLET BY MOUTH ONCE DAILY (Patient taking differently: Patient states he is taking 1/2 tablet daily), Disp: 45 tablet, Rfl: PRN .  TRUEPLUS LANCETS 33G MISC, 1 Units by Does not apply route 3 (three) times daily., Disp: , Rfl:  .  verapamil (CALAN-SR) 240 MG CR tablet, Take 1 tablet (240 mg total) by mouth daily., Disp: 90 tablet, Rfl: 3 .  emtricitabine-rilpivir-tenofovir AF (ODEFSEY) 200-25-25 MG TABS tablet, Take 1 tablet by mouth daily with breakfast., Disp: 30 tablet, Rfl: 11   Review of Systems  Constitutional: Negative for fever, chills, diaphoresis, activity change, appetite change, fatigue and unexpected weight change.  HENT: Negative for congestion, rhinorrhea, sinus pressure, sneezing, sore throat and trouble swallowing.   Eyes: Negative for photophobia and visual disturbance.  Respiratory: Negative for cough, chest tightness, shortness of breath, wheezing and stridor.   Cardiovascular: Negative for palpitations and leg swelling.  Gastrointestinal: Negative for nausea, vomiting, abdominal pain, diarrhea, constipation, blood in stool, abdominal distention and anal bleeding.   Endocrine: Negative for heat intolerance, polydipsia and polyuria.  Genitourinary: Negative for dysuria, hematuria, flank pain and difficulty urinating.  Musculoskeletal: Negative for myalgias, joint swelling and gait problem.  Skin: Negative for color change, pallor, rash and wound.  Neurological: Negative for dizziness, tremors, weakness and light-headedness.  Hematological: Negative for adenopathy. Does not bruise/bleed easily.  Psychiatric/Behavioral: Negative for behavioral problems, confusion, sleep disturbance, dysphoric mood, decreased concentration and agitation.       Objective:   Physical Exam  Constitutional: He is oriented to person, place, and time. He appears well-developed and well-nourished. No distress.  HENT:  Head: Normocephalic and atraumatic.  Mouth/Throat: Oropharynx is clear and moist. No oropharyngeal exudate.  Eyes: Conjunctivae  and EOM are normal. Pupils are equal, round, and reactive to light. No scleral icterus.  Neck: Normal range of motion. Neck supple. No JVD present.  Cardiovascular: Normal rate, regular rhythm and normal heart sounds.  Exam reveals no gallop and no friction rub.   No murmur heard. Pulmonary/Chest: Effort normal and breath sounds normal. No respiratory distress. He has no wheezes. He has no rales. He exhibits no tenderness.  Abdominal: He exhibits no distension and no mass. There is no tenderness. There is no rebound and no guarding.  Musculoskeletal: He exhibits no edema or tenderness.  Lymphadenopathy:    He has no cervical adenopathy.  Neurological: He is alert and oriented to person, place, and time. He has normal reflexes. He exhibits normal muscle tone. Coordination normal.  Skin: Skin is warm and dry. He is not diaphoretic. No erythema. No pallor.  Psychiatric: He has a normal mood and affect. His behavior is normal. Judgment and thought content normal.          Assessment & Plan:   HIV: switch from  complera to [facility]  with meal daily. NO PPI, H2 blockers   IDDM: followed by Dr. Adin Hector.   Lab Results  Component Value Date   HGBA1C 7.9 05/03/2015     Hyperlipidemia: on f lipitor and LDL is at goal.  Lipid Panel     Component Value Date/Time   CHOL 126 06/21/2015 1356   TRIG 191* 06/21/2015 1356   HDL 28* 06/21/2015 1356   CHOLHDL 4.5 06/21/2015 1356   VLDL 38* 06/21/2015 1356   LDLCALC 60 06/21/2015 1356   Fibular fracture: healing  Zoster eruption: would like to vaccinate him but insurance may not pay due to age  Transaminitis: US abdomen unrevealing and no viral hepatitis. They have been in this range going back to 2011. I doubt medication effect  I spent greater than 40 minutes with the patient including greater than 50% of time in face to face counsel of the patient re his HIV new ARV regimen, IDDM, Hyperipidemia, fibular fracture, zoster  and in coordination of their care.

## 2015-07-19 LAB — HM DIABETES EYE EXAM

## 2015-07-26 ENCOUNTER — Ambulatory Visit (INDEPENDENT_AMBULATORY_CARE_PROVIDER_SITE_OTHER): Payer: 59 | Admitting: Internal Medicine

## 2015-07-26 ENCOUNTER — Encounter: Payer: Self-pay | Admitting: Internal Medicine

## 2015-07-26 VITALS — BP 132/66 | HR 68 | Temp 98.0°F | Ht 74.0 in | Wt 217.8 lb

## 2015-07-26 DIAGNOSIS — M549 Dorsalgia, unspecified: Secondary | ICD-10-CM | POA: Insufficient documentation

## 2015-07-26 DIAGNOSIS — M5441 Lumbago with sciatica, right side: Secondary | ICD-10-CM

## 2015-07-26 MED ORDER — CYCLOBENZAPRINE HCL 5 MG PO TABS: 5.0000 mg | ORAL_TABLET | Freq: Three times a day (TID) | ORAL | Status: DC | PRN

## 2015-07-26 MED ORDER — MELOXICAM 15 MG PO TABS: 15.0000 mg | ORAL_TABLET | Freq: Every day | ORAL | Status: DC

## 2015-07-26 NOTE — Progress Notes (Signed)
Patient ID: Trevor Fischer, male   DOB: 1960/12/02, 54 y.o.   MRN: [id number]    Subjective:   Patient ID: Trevor Fischer male   DOB: 02-12-1961 54 y.o.   MRN: [id number]  HPI: Trevor Fischer is a 54 y.o. man with PMH noted below who is here for back pain.  He says that his back pain started 2 weeks ago abruptly and he has pain going down his right leg up to the ankle and it is a 9/10 sharp stabbing pain with associated tingling. He does not recall how exactly it started but says he may have lifted something. He has a history of right fibula fracture but says that has been taken care after he saw Orthopaedics. He says that it is better when he sits in a certain position.   He has been taking 800 mg ibuprofen three times a day, 600 mg gabapentin 3 times a day, and his husban'd flexeril 3 times a day without any relief.   He denies night time back pain, any constitutional symptoms, any b/b incontinence and any red flags.    Please see Problem List for the status of the patient's chronic medical problems.   Past Medical History  Diagnosis Date  . 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 Hyperlipidemia   . Degenerative joint disease of knee   . HIV infection ([place])   . Hypertension   . GERD (gastroesophageal reflux disease)   . Hx MRSA infection   . Allergic rhinitis   . Superficial thrombophlebitis   . Diabetes mellitus 9/09  . DVT (deep venous thrombosis) ([facility])     Ended anticoagulation in 2012   Current Outpatient Prescriptions  Medication Sig Dispense Refill  . aspirin EC 81 MG tablet Take 81 mg by mouth daily.    Marland Kitchen atorvastatin (LIPITOR) 40 MG tablet Take 1 tablet (40 mg total) by mouth daily. 30 tablet 11  . chlorpheniramine (CHLOR-TRIMETON) 4 MG tablet Take 4 mg by mouth every 4 (four) hours as needed for allergies.     Marland Kitchen emtricitabine-rilpivir-tenofovir AF (ODEFSEY) 200-25-25 MG TABS tablet Take 1 tablet by mouth daily with breakfast. 30 tablet 11  .  gabapentin (NEURONTIN) 300 MG capsule Take 2 capsules (600 mg total) by mouth 3 (three) times daily. 180 capsule 2  . glucose blood (TRUETEST TEST) test strip Use as instructed 100 each 12  . Insulin Glargine (LANTUS SOLOSTAR) 100 UNIT/ML Solostar Pen Inject 23 Units into the skin daily at 10 pm. 45 mL 3  . Insulin Pen Needle (UNIFINE PENTIPS) 31G X 8 MM MISC USE TO INJECT INSULIN 4 TIMES DAILY 100 each 11  . Lactobacillus (ACIDOPHILUS PROBIOTIC) 10 MG TABS Take 10 mg by mouth 3 (three) times daily. (Patient taking differently: Take 10 mg by mouth daily. ) 90 tablet 3  . losartan (COZAAR) 50 MG tablet Take 1 tablet (50 mg total) by mouth daily. 60 tablet 2  . Multiple Vitamins-Minerals (MULTIVITAMIN WITH MINERALS) tablet Take 1 tablet by mouth daily.    Marland Kitchen NOVOLOG FLEXPEN 100 UNIT/ML FlexPen INJECT 5 UNITS INTO THE SKIN 4 TIMES DAILY - BEFORE MEALS AND AT BEDTIME. 15 mL 5  . Omega-3 Fatty Acids (FISH OIL) 1000 MG CAPS Take 1 capsule by mouth 2 (two) times daily.     . Simethicone 180 MG CAPS Take 1 capsule (180 mg total) by mouth 3 (three) times daily with meals as needed. For flatulence, take after meal 90 capsule 5  .  traMADol (ULTRAM) 50 MG tablet TAKE 1 TABLET BY MOUTH EVERY 12 HOURS AS NEEDED 30 tablet 1  . triamterene-hydrochlorothiazide (MAXZIDE) 75-50 MG per tablet TAKE 1 TABLET BY MOUTH ONCE DAILY (Patient taking differently: Patient states he is taking 1/2 tablet daily) 45 tablet PRN  . TRUEPLUS LANCETS 33G MISC 1 Units by Does not apply route 3 (three) times daily.    . verapamil (CALAN-SR) 240 MG CR tablet Take 1 tablet (240 mg total) by mouth daily. 90 tablet 3  . Alcohol Swabs (ALCOHOL PREPS) PADS 1 each by Does not apply route 3 (three) times daily. Use up to 8 times daily to check blood sugar and inject insulin into the skin. diag code E11.40. Insulin dependent 300 each 6  . cyclobenzaprine (FLEXERIL) 5 MG tablet Take 1 tablet (5 mg total) by mouth 3 (three) times daily as needed for  muscle spasms. 30 tablet 0  . meloxicam (MOBIC) 15 MG tablet Take 1 tablet (15 mg total) by mouth daily. 15 tablet 0   No current facility-administered medications for this visit.   Family History  Problem Relation Age of Onset  . Diabetes Father   . Kidney disease Father   . Heart failure Father   . Hyperlipidemia Father   . Hypertension Father   . Osteoarthritis Mother    Social History   Social History  . Marital Status: Significant Other    Spouse Name: N/A  . Number of Children: N/A  . Years of Education: N/A   Social History Main Topics  . Smoking status: Former Smoker -- 20 years    Types: Cigarettes    Quit date: 07/02/2005  . Smokeless tobacco: Never Used  . Alcohol Use: 4.2 oz/week    0 Standard drinks or equivalent, 7 Glasses of wine per week  . Drug Use: No  . Sexual Activity: Not Asked     Comment: declined condoms   Other Topics Concern  . None   Social History Narrative   Lives in [place], with partner of 34 years    Works as Chartered certified accountant at [employer]   Review of Systems: Review of Systems  Constitutional: Negative.  Negative for fever, chills, weight loss and malaise/fatigue.  HENT: Negative.   Respiratory: Negative.  Negative for cough and shortness of breath.   Cardiovascular: Negative.  Negative for leg swelling.  Gastrointestinal: Negative for abdominal pain and diarrhea.  Musculoskeletal: Positive for back pain. Negative for myalgias, joint pain, falls and neck pain.  Neurological: Positive for tingling. Negative for dizziness, focal weakness and weakness.    Objective:  Physical Exam: Filed Vitals:   07/26/15 1635  BP: 132/66  Pulse: 68  Temp: 98 F (36.7 C)  TempSrc: Oral  Height: 6\' 2"  (1.88 m)  Weight: 217 lb 12.8 oz (98.793 kg)  SpO2: 99%   Physical Exam  Constitutional: He is oriented to person, place, and time. He appears well-developed and well-nourished.  HENT:  Head: Normocephalic and  atraumatic.  Eyes: Pupils are equal, round, and reactive to light.  Neck: Normal range of motion. Neck supple.  Cardiovascular: Normal rate, regular rhythm, normal heart sounds and intact distal pulses.   No murmur heard. Pulmonary/Chest: Effort normal and breath sounds normal.  Abdominal: Soft. Bowel sounds are normal. He exhibits no distension. There is no tenderness.  Musculoskeletal:  Straight leg test positive in right leg Straight leg test negative in left leg  No paraspinal muscle tenderness,  Limited  ROM of back  Neurological: He is alert and oriented to person, place, and time. He has normal reflexes.  Normal motor strength in all 4 extremities Normal sensation in all 4 extremities No focal neuro deficits  Skin: Skin is warm and dry. No rash noted.  Psychiatric: He has a normal mood and affect.    Assessment & Plan:   Please see problem based charting for assessment and plan

## 2015-07-26 NOTE — Patient Instructions (Addendum)
Thank you for your visit today. For your back pain, please take the meloxicam for 10-12 days- Please discontinue the ibuprofen. Please take the flexeril as needed for 2 weeks.  Please call us if the pain does not improve in 2 weeks or so. Back Pain, Adult Back pain is very common in adults.The cause of back pain is rarely dangerous and the pain often gets better over time.The cause of your back pain may not be known. Some common causes of back pain include:  Strain of the muscles or ligaments supporting the spine.  Wear and tear (degeneration) of the spinal disks.  Arthritis.  Direct injury to the back. For many people, back pain may return. Since back pain is rarely dangerous, most people can learn to manage this condition on their own. HOME CARE INSTRUCTIONS Watch your back pain for any changes. The following actions may help to lessen any discomfort you are feeling:  Remain active. It is stressful on your back to sit or stand in one place for long periods of time. Do not sit, drive, or stand in one place for more than 30 minutes at a time. Take short walks on even surfaces as soon as you are able.Try to increase the length of time you walk each day.  Exercise regularly as directed by your health care provider. Exercise helps your back heal faster. It also helps avoid future injury by keeping your muscles strong and flexible.  Do not stay in bed.Resting more than 1-2 days can delay your recovery.  Pay attention to your body when you bend and lift. The most comfortable positions are those that put less stress on your recovering back. Always use proper lifting techniques, including:  Bending your knees.  Keeping the load close to your body.  Avoiding twisting.  Find a comfortable position to sleep. Use a firm mattress and lie on your side with your knees slightly bent. If you lie on your back, put a pillow under your knees.  Avoid feeling anxious or stressed.Stress increases  muscle tension and can worsen back pain.It is important to recognize when you are anxious or stressed and learn ways to manage it, such as with exercise.  Take medicines only as directed by your health care provider. Over-the-counter medicines to reduce pain and inflammation are often the most helpful.Your health care provider may prescribe muscle relaxant drugs.These medicines help dull your pain so you can more quickly return to your normal activities and healthy exercise.  Apply ice to the injured area:  Put ice in a plastic bag.  Place a towel between your skin and the bag.  Leave the ice on for 20 minutes, 2-3 times a day for the first 2-3 days. After that, ice and heat may be alternated to reduce pain and spasms.  Maintain a healthy weight. Excess weight puts extra stress on your back and makes it difficult to maintain good posture. SEEK MEDICAL CARE IF:  You have pain that is not relieved with rest or medicine.  You have increasing pain going down into the legs or buttocks.  You have pain that does not improve in one week.  You have night pain.  You lose weight.  You have a fever or chills. SEEK IMMEDIATE MEDICAL CARE IF:   You develop new bowel or bladder control problems.  You have unusual weakness or numbness in your arms or legs.  You develop nausea or vomiting.  You develop abdominal pain.  You feel faint.   This  information is not intended to replace advice given to you by your health care provider. Make sure you discuss any questions you have with your health care provider.   Document Released: 09/18/2005 Document Revised: 10/09/2014 Document Reviewed: 01/20/2014 Elsevier Interactive Patient Education [employer].

## 2015-07-26 NOTE — Assessment & Plan Note (Signed)
He says that his back pain started 2 weeks ago abruptly and he has pain going down his right leg up to the ankle and it is a 9/10 sharp stabbing pain with associated tingling. He does not recall how exactly it started but says he may have lifted something. He has a history of right fibula fracture but says that has been taken care after he saw Orthopaedics. He  He has been taking 800 mg ibuprofen three times a day, 600 mg gabapentin 3 times a day, and his husban'd flexeril 3 times a day without any relief.  He denies night time back pain, any constitutional symptoms, any b/b incontinence and any red flags.   A/P Low back pain with right sided sciatica  -Explained to pt that this may take time to resolve 4-6 weeks. -Prescribed 15 mg mobic daily for 2 weeks Flexeril 5 mg tid PRN for 2 weeks, explained side effects like drowsiness and to not drive while taking this -Continue gabapentin 600 mg tid  explained side effects like drowsiness  -Asked the patient to discontinue ibuprofen and explained the renal side effects -Explained red flag symptoms. If the pain does not improve, then can consider meds like Sulindac and other NSAIDs like ketorolac. Consider imaging like MRI if it does not resolve in few months. -Consider physical therapy in near future -Pt will call back if it does not help in 3 weeks, may prescribe as mentioned above. He will RTC if the symptoms worsen

## 2015-07-27 NOTE — Progress Notes (Signed)
I saw and evaluated the patient.  I personally confirmed the key portions of Dr. Sherlynn Carbon history and exam and reviewed pertinent patient test results.  The assessment, diagnosis, and plan were formulated together and I agree with the documentation in the resident's note.  Likely has acute sciatica which has persisted X 3 weeks.  Will continue conservative management with anti-inflammatory (structure he has yet to try), muscle relaxant, and gabapentin which he is on chronically.  If no improvement in next 3 weeks will consider further evaluation with imaging.  He will call if no relief on the mobic.  At that time he can be empirically switched to diclofenac, salsalate, or tramadol for a therapeutic trial.

## 2015-07-28 ENCOUNTER — Encounter: Payer: Self-pay | Admitting: *Deleted

## 2015-08-02 ENCOUNTER — Other Ambulatory Visit: Payer: Self-pay | Admitting: Internal Medicine

## 2015-08-02 ENCOUNTER — Ambulatory Visit (INDEPENDENT_AMBULATORY_CARE_PROVIDER_SITE_OTHER): Payer: Self-pay | Admitting: Family Medicine

## 2015-08-02 VITALS — BP 128/70 | Ht 74.0 in | Wt 218.4 lb

## 2015-08-02 DIAGNOSIS — E119 Type 2 diabetes mellitus without complications: Secondary | ICD-10-CM

## 2015-08-02 DIAGNOSIS — Z794 Long term (current) use of insulin: Secondary | ICD-10-CM

## 2015-08-02 NOTE — Progress Notes (Signed)
Subjective:  Patient presents today for 3 month diabetes follow-up as part of the employer-sponsored [employer] program.  Current diabetes regimen includes Lantus 23 units SQ once daily and Novolog 8 units with meals. Patient also continues on daily ASA, ARB and statin.   Last appointment with Dr. Benjamine Mola was in August. A1C at that visit was 7.9.   Patient has been having difficulty with leg pain and sciatica lately. He has started taking cyclobenzaprine and meloxicam.   Patient asked that I check his A1C today. He was convinced that A1C would be higher than 8% because "I am doing so bad lately".   Assessment:  Diabetes: A1C today was 7.6   % which is exceeding goal of less than 7%. Last A1C was 7.9%.  Weight is increased from last visit with me.   Patient was very discouraged during this appointment because he has gained 5 pounds since his last visit and that his CBGs are elevated on his meter. He has been limited in exercising due to a fracture earlier in the summer and now sciatica pain.   I encouraged patient to continue to make healthy eating choices and to avoid concentrated sweets. I also brainstormed with patient on ideas to help him with physical activity. He states that he thinks he could do some leisurely walking. I also showed him some chair exercises on YouTube that he could also try.    CBG Review: Checking 3-4 times daily. CBG averages are about 20 points higher than at last visit. Denies hypoglycemia.   High/Low- 227/67   Lifestyle improvements:  Physical Activity-  None due to sciatica pain.   Nutrition-  Patient states he is eating more pancakes, but he is using sugar free syrup. He is struggling with eating mini size candy since it is around for Halloween. He also admits to eating more cookies. Last visit he was also snacking on cookies and he has cut back to only 1-2 daily instead of 4-5 daily.    Follow up with me in 3 months. I will send A1C result to Dr.  Marveen Reeks office.    Plan/Goals for Next Visit:  1. Ideas for exercise- look for chair exercises on You Tube and find one that you are interested in. If you can walk at a gentle or slower pace and not aggravate your leg, try that.  2. Increase consumption of fresh produce (fruit and vegetables). Aim for 3 servings daily.  3. Instead of a cookie, reach for a piece of fruit or salad.     Next appointment to see me is: Monday January 30th at 8 AM.    Truett Mainland. Donneta Romberg, PharmD, BCPS, CDE [facility] [place] Coordinator [phone]

## 2015-08-07 ENCOUNTER — Encounter: Payer: Self-pay | Admitting: Internal Medicine

## 2015-08-13 ENCOUNTER — Other Ambulatory Visit: Payer: Self-pay | Admitting: *Deleted

## 2015-08-14 MED ORDER — CYCLOBENZAPRINE HCL 5 MG PO TABS: ORAL_TABLET | ORAL | Status: DC

## 2015-08-14 MED ORDER — MELOXICAM 15 MG PO TABS: 15.0000 mg | ORAL_TABLET | Freq: Every day | ORAL | Status: DC

## 2015-08-17 NOTE — Progress Notes (Signed)
I have reviewed this pharmacist's note and agree  

## 2015-09-14 ENCOUNTER — Ambulatory Visit (HOSPITAL_COMMUNITY)
Admission: RE | Admit: 2015-09-14 | Discharge: 2015-09-14 | Disposition: A | Discharge status: Home / Self Care | Payer: 59 | Source: Ambulatory Visit | Attending: Internal Medicine | Admitting: Internal Medicine

## 2015-09-14 ENCOUNTER — Ambulatory Visit (INDEPENDENT_AMBULATORY_CARE_PROVIDER_SITE_OTHER): Payer: 59 | Admitting: Internal Medicine

## 2015-09-14 ENCOUNTER — Encounter: Payer: Self-pay | Admitting: Internal Medicine

## 2015-09-14 VITALS — BP 124/59 | HR 62 | Temp 97.9°F | Ht 74.0 in | Wt 221.5 lb

## 2015-09-14 DIAGNOSIS — Z21 Asymptomatic human immunodeficiency virus [HIV] infection status: Secondary | ICD-10-CM

## 2015-09-14 DIAGNOSIS — E0840 Diabetes mellitus due to underlying condition with diabetic neuropathy, unspecified: Secondary | ICD-10-CM

## 2015-09-14 DIAGNOSIS — E1140 Type 2 diabetes mellitus with diabetic neuropathy, unspecified: Secondary | ICD-10-CM | POA: Diagnosis not present

## 2015-09-14 DIAGNOSIS — Z794 Long term (current) use of insulin: Secondary | ICD-10-CM

## 2015-09-14 DIAGNOSIS — R059 Cough, unspecified: Secondary | ICD-10-CM

## 2015-09-14 DIAGNOSIS — I10 Essential (primary) hypertension: Secondary | ICD-10-CM

## 2015-09-14 DIAGNOSIS — R05 Cough: Secondary | ICD-10-CM | POA: Insufficient documentation

## 2015-09-14 MED ORDER — CHLORPHENIRAMINE MALEATE 4 MG PO TABS: 4.0000 mg | ORAL_TABLET | ORAL | Status: DC | PRN

## 2015-09-14 NOTE — Patient Instructions (Signed)
Today we recommend restarting chlorpheniramine up to every 4 hours as needed for chronic cough. This should give the benefit within the next week or 2 if you have contribution from allergies or upper airway cough syndrome. We recommend trying for at least 4 weeks before deciding if the medication is giving any benefit.  We will see you back in clinic in approximately 1 month to see if your cough is getting any better. At that time we will discuss continuing your antihistamine versus exploring alternate causes such as medication side effect.

## 2015-09-14 NOTE — Progress Notes (Signed)
Subjective:   Patient ID: RIAL SABIR male   DOB: 23-Nov-1960 54 y.o.   MRN: [id number]  HPI: Mr.Lucas L Overbaugh is a 54 y.o. man with PMHx as described below presenting for worsening of a chronic cough. He has been coughing for the past year but his symptoms have worsened for the past 1-2 months. He works third shift, and his cough is worst in bed to sleep during the day. He does not have any associated chest pain or sore throat. He does not become short of breath or wheeze. He has not had rhinorrhea or sinus pressure. H has had fever or chills, and his weight is unchanged.  See problem based assessment and plan below for additional details.  Past Medical History  Diagnosis Date  . 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 Hyperlipidemia   . Degenerative joint disease of knee   . HIV infection ([place])   . Hypertension   . GERD (gastroesophageal reflux disease)   . Hx MRSA infection   . Allergic rhinitis   . Superficial thrombophlebitis   . Diabetes mellitus 9/09  . DVT (deep venous thrombosis) (H[facility])     Ended anticoagulation in 2012   Current Outpatient Prescriptions  Medication Sig Dispense Refill  . Alcohol Swabs (ALCOHOL PREPS) PADS 1 each by Does not apply route 3 (three) times daily. Use up to 8 times daily to check blood sugar and inject insulin into the skin. diag code E11.40. Insulin dependent 300 each 6  . aspirin EC 81 MG tablet Take 81 mg by mouth daily.    Marland Kitchen atorvastatin (LIPITOR) 40 MG tablet Take 1 tablet (40 mg total) by mouth daily. 30 tablet 11  . chlorpheniramine (CHLOR-TRIMETON) 4 MG tablet Take 4 mg by mouth every 4 (four) hours as needed for allergies.     . cyclobenzaprine (FLEXERIL) 5 MG tablet TAKE 1 TABLET BY MOUTH 3 TIMES DAILY AS NEEDED FOR MUSCLE SPASMS. 30 tablet 0  . emtricitabine-rilpivir-tenofovir AF (ODEFSEY) 200-25-25 MG TABS tablet Take 1 tablet by mouth daily with breakfast. 30 tablet 11  . gabapentin (NEURONTIN) 300 MG capsule Take  2 capsules (600 mg total) by mouth 3 (three) times daily. 180 capsule 2  . glucose blood (TRUETEST TEST) test strip Use as instructed 100 each 12  . Insulin Glargine (LANTUS SOLOSTAR) 100 UNIT/ML Solostar Pen Inject 23 Units into the skin daily at 10 pm. 45 mL 3  . Lactobacillus (ACIDOPHILUS PROBIOTIC) 10 MG TABS Take 10 mg by mouth 3 (three) times daily. (Patient not taking: Reported on 08/02/2015) 90 tablet 3  . losartan (COZAAR) 50 MG tablet Take 1 tablet (50 mg total) by mouth daily. 60 tablet 2  . meloxicam (MOBIC) 15 MG tablet Take 1 tablet (15 mg total) by mouth daily. 15 tablet 0  . Multiple Vitamins-Minerals (MULTIVITAMIN WITH MINERALS) tablet Take 1 tablet by mouth daily.    Marland Kitchen NOVOLOG FLEXPEN 100 UNIT/ML FlexPen INJECT 5 UNITS INTO THE SKIN 4 TIMES DAILY - BEFORE MEALS AND AT BEDTIME. 15 mL 5  . Omega-3 Fatty Acids (FISH OIL) 1000 MG CAPS Take 1 capsule by mouth 2 (two) times daily.     . Simethicone 180 MG CAPS Take 1 capsule (180 mg total) by mouth 3 (three) times daily with meals as needed. For flatulence, take after meal 90 capsule 5  . traMADol (ULTRAM) 50 MG tablet TAKE 1 TABLET BY MOUTH EVERY 12 HOURS AS NEEDED (Patient not taking: Reported on 08/02/2015) 30  tablet 1  . triamterene-hydrochlorothiazide (MAXZIDE) 75-50 MG per tablet TAKE 1 TABLET BY MOUTH ONCE DAILY (Patient taking differently: Patient states he is taking 1/2 tablet daily) 45 tablet PRN  . TRUEPLUS LANCETS 33G MISC 1 Units by Does not apply route 3 (three) times daily.    Marland Kitchen UNIFINE PENTIPS 31G X 8 MM MISC USE TO INJECT INSULIN 4 TIMES DAILY 100 each 11  . verapamil (CALAN-SR) 240 MG CR tablet Take 1 tablet (240 mg total) by mouth daily. 90 tablet 3   No current facility-administered medications for this visit.   Family History  Problem Relation Age of Onset  . Diabetes Father   . Kidney disease Father   . Heart failure Father   . Hyperlipidemia Father   . Hypertension Father   . Osteoarthritis Mother     Social History   Social History  . Marital Status: Significant Other    Spouse Name: N/A  . Number of Children: N/A  . Years of Education: N/A   Social History Main Topics  . Smoking status: Former Smoker -- 20 years    Types: Cigarettes    Quit date: 07/02/2005  . Smokeless tobacco: Never Used  . Alcohol Use: 4.2 oz/week    0 Standard drinks or equivalent, 7 Glasses of wine per week  . Drug Use: No  . Sexual Activity: Not Asked     Comment: declined condoms   Other Topics Concern  . None   Social History Narrative   Lives in [place], with partner of 9 years    Works as Chartered certified accountant at [employer]   Has [employer]   Review of Systems: Review of Systems  Constitutional: Negative for fever, chills and weight loss.  HENT: Negative for congestion and sore throat.   Eyes: Negative for blurred vision and double vision.  Respiratory: Positive for cough. Negative for hemoptysis, sputum production, shortness of breath and wheezing.   Cardiovascular: Negative for chest pain.  Gastrointestinal: Negative for nausea and diarrhea.  Musculoskeletal: Negative for myalgias and joint pain.  Skin: Negative for rash.  Neurological: Negative for dizziness and headaches.  Psychiatric/Behavioral: Negative for substance abuse.    Objective:  Physical Exam: Filed Vitals:   09/14/15 1534  BP: 124/59  Pulse: 62  Temp: 97.9 F (36.6 C)  TempSrc: Oral  Height: 6\' 2"  (1.88 m)  Weight: 221 lb 8 oz (100.472 kg)  SpO2: 99%   GENERAL- alert, co-operative, NAD HEENT- Conjunctivae non-injected, oral mucosa appears moist, good and intact dentition, no cervical LN enlargement. CARDIAC- RRR, no murmurs, rubs or gallops. RESP- CTAB, no wheezes or crackles. EXTREMITIES- symmetric, no pedal edema. SKIN- Warm, dry, No rash or lesion. PSYCH- Normal mood and affect, appropriate thought content and speech.  Assessment & Plan:

## 2015-09-15 NOTE — Assessment & Plan Note (Signed)
Glucometer log reviewed with the average reading of 170. He has not had any hypoglycemic episodes recently. Notes from his recent visit with Ivar Drape for his [facility] [employer] were reviewed and he does still endorse eating concentrated sweets more than twice a week. Last hemoglobin A1c 7.4% within the past 3 months. If this remains above goal despite continued efforts at diet modification will plan to titrate insulin by his next follow-up appointment.

## 2015-09-15 NOTE — Assessment & Plan Note (Signed)
BP Readings from Last 3 Encounters:  09/14/15 124/59  08/02/15 128/70  07/26/15 132/66    Lab Results  Component Value Date   NA 136 06/21/2015   K 4.4 06/21/2015   CREATININE 1.07 06/21/2015    Assessment: Blood pressure control: Controlled Progress toward BP goal:  At goal Comments: Patient is well-controlled with a stable medication regimen including triamterene-HCTZ 75-50 mg, verapamil 240 mg, losartan 50 mg  Plan: Medications:  continue current medications Educational resources provided:   Self management tools provided:   Other plans: Continue current medicines and his last lab work was in September stable on these doses for a while now, looking good we can probably reassess in 3 months

## 2015-09-15 NOTE — Assessment & Plan Note (Addendum)
Assessment: This is a chronic nonproductive cough lasting at least the past year that has not undergone significant previous evaluation. He is not and has never been a smoker. His HIV is well controlled and his diabetes is fairly well controlled. He does not have any history of asthma but does have a history of GERD and allergic rhinitis. He had previously taken chlorpheniramine for rhinitis and famotidine for GERD but is not on these anymore. He was changed from lisinopril to losartan on account of this persistent dry cough, without improvement.  On review likely explanations for his current cough are upper airway cough syndrome, untreated GERD, medication side effect from his losartan, or cough variant asthma.  Plan: -2 view chest xray today -Start chlorpheniramine q4hrs as tolerated and as needed/schedule for the next month -If his cough is fully improved in the next month with treatment then UACS will be our diagnosis by therapeutic trial. If his symptoms are not improved I would plan to start a trial of high dose PPI as he also has a history of GERD that he currently takes only TUMS as needed for infrequent heartburn. Trial of discontinuing losartan would be my later step in evaluation as this has only a 3% association with chronic cough and he would benefit from remaining on ARB therapy given his diabetes and hypertension.

## 2015-09-21 NOTE — Addendum Note (Signed)
Addended by: Oval Linsey D on: 09/21/2015 08:34 AM   Modules accepted: Level of Service

## 2015-09-21 NOTE — Progress Notes (Signed)
I saw and evaluated the patient.  I personally confirmed the key portions of Dr. Rice's history and exam and reviewed pertinent patient test results.  The assessment, diagnosis, and plan were formulated together and I agree with the documentation in the resident's note. 

## 2015-09-30 ENCOUNTER — Other Ambulatory Visit: Payer: Self-pay | Admitting: Internal Medicine

## 2015-09-30 NOTE — Telephone Encounter (Signed)
When I last saw [name] on 09/14/2015 he stated that his sciatica had resolved.  That was the reason for the flexeril and mobic. Please contact the patient to see if his sciatica has returned and he requires refill of these medications.  Thanks.

## 2015-09-30 NOTE — Telephone Encounter (Signed)
Pt called ; no answer - left message to call [facility].

## 2015-10-06 MED FILL — CYCLOBENZAPRINE 5 MG TABLET: 5 | 10 days supply | Qty: 30 | Fill #0

## 2015-10-06 MED FILL — MELOXICAM 15 MG TABLET: 15 | 15 days supply | Qty: 15 | Fill #0

## 2015-10-06 NOTE — Telephone Encounter (Signed)
Pt stated he needs the medications for flare-up pain; as needed basis for the sciatica pain.

## 2015-10-12 ENCOUNTER — Encounter: Payer: Self-pay | Admitting: Internal Medicine

## 2015-10-12 ENCOUNTER — Other Ambulatory Visit: Payer: Self-pay | Admitting: Pharmacist Clinician (PhC)/ Clinical Pharmacy Specialist

## 2015-10-12 ENCOUNTER — Ambulatory Visit (INDEPENDENT_AMBULATORY_CARE_PROVIDER_SITE_OTHER): Payer: 59 | Admitting: Internal Medicine

## 2015-10-12 ENCOUNTER — Encounter: Payer: Self-pay | Admitting: Pharmacist Clinician (PhC)/ Clinical Pharmacy Specialist

## 2015-10-12 VITALS — BP 134/68 | HR 67 | Temp 97.8°F | Ht 74.0 in | Wt 219.1 lb

## 2015-10-12 DIAGNOSIS — S82831A Other fracture of upper and lower end of right fibula, initial encounter for closed fracture: Secondary | ICD-10-CM

## 2015-10-12 DIAGNOSIS — R059 Cough, unspecified: Secondary | ICD-10-CM

## 2015-10-12 DIAGNOSIS — E1142 Type 2 diabetes mellitus with diabetic polyneuropathy: Secondary | ICD-10-CM

## 2015-10-12 DIAGNOSIS — R05 Cough: Secondary | ICD-10-CM

## 2015-10-12 DIAGNOSIS — Z794 Long term (current) use of insulin: Secondary | ICD-10-CM | POA: Diagnosis not present

## 2015-10-12 DIAGNOSIS — M7989 Other specified soft tissue disorders: Secondary | ICD-10-CM | POA: Diagnosis not present

## 2015-10-12 DIAGNOSIS — E0840 Diabetes mellitus due to underlying condition with diabetic neuropathy, unspecified: Secondary | ICD-10-CM

## 2015-10-12 LAB — GLUCOSE, CAPILLARY: GLUCOSE-CAPILLARY: 185 mg/dL — AB (ref 65–99)

## 2015-10-12 MED ORDER — PANTOPRAZOLE SODIUM 40 MG PO TBEC: 40.0000 mg | DELAYED_RELEASE_TABLET | Freq: Every day | ORAL | Status: DC

## 2015-10-12 MED ORDER — DOLUTEGRAVIR SODIUM 50 MG PO TABS: 50.0000 mg | ORAL_TABLET | Freq: Every day | ORAL | Status: DC

## 2015-10-12 MED ORDER — EMTRICITABINE-TENOFOVIR AF 200-25 MG PO TABS: 1.0000 | ORAL_TABLET | Freq: Every day | ORAL | Status: DC

## 2015-10-12 MED FILL — DESCOVY 200-25 MG TABS: 200-25 | 30 days supply | Qty: 30 | Fill #0

## 2015-10-12 MED FILL — PANTOPRAZOLE SOD DR 40 MG T: 40 | 90 days supply | Qty: 90 | Fill #0

## 2015-10-12 NOTE — Progress Notes (Signed)
Patient ID: Trevor Fischer, male   DOB: [birth year], 55 y.o.   MRN: [id number]    Subjective:   Patient ID: Trevor Fischer male   DOB: [birth year] 55 y.o.   MRN: [id number]  HPI: [name] is a 55 y.o. male with PMH as below, here for follow up of chronic cough and DM.  Please see Problem-Based charting for the status of the patient's chronic medical issues.     Past Medical History  Diagnosis Date  . 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 Hyperlipidemia   . Degenerative joint disease of knee   . HIV infection ([place])   . Hypertension   . GERD (gastroesophageal reflux disease)   . Hx MRSA infection   . Allergic rhinitis   . Superficial thrombophlebitis   . Diabetes mellitus 9/09  . DVT (deep venous thrombosis) (HCC)     Ended anticoagulation in 2012   Current Outpatient Prescriptions  Medication Sig Dispense Refill  . Alcohol Swabs (ALCOHOL PREPS) PADS 1 each by Does not apply route 3 (three) times daily. Use up to 8 times daily to check blood sugar and inject insulin into the skin. diag code E11.40. Insulin dependent 300 each 6  . aspirin EC 81 MG tablet Take 81 mg by mouth daily.    Marland Kitchen atorvastatin (LIPITOR) 40 MG tablet Take 1 tablet (40 mg total) by mouth daily. 30 tablet 11  . chlorpheniramine (CHLOR-TRIMETON) 4 MG tablet Take 1 tablet (4 mg total) by mouth every 4 (four) hours as needed for allergies. 150 tablet 1  . cyclobenzaprine (FLEXERIL) 5 MG tablet TAKE 1 TABLET BY MOUTH 3 TIMES DAILY AS NEEDED FOR MUSCLE SPASMS. 30 tablet PRN  . emtricitabine-rilpivir-tenofovir AF (ODEFSEY) 200-25-25 MG TABS tablet Take 1 tablet by mouth daily with breakfast. 30 tablet 11  . gabapentin (NEURONTIN) 300 MG capsule Take 2 capsules (600 mg total) by mouth 3 (three) times daily. 180 capsule 2  . glucose blood (TRUETEST TEST) test strip Use as instructed 100 each 12  . Insulin Glargine (LANTUS SOLOSTAR) 100 UNIT/ML Solostar Pen Inject 23 Units into the skin daily  at 10 pm. 45 mL 3  . Lactobacillus (ACIDOPHILUS PROBIOTIC) 10 MG TABS Take 10 mg by mouth 3 (three) times daily. (Patient not taking: Reported on 08/02/2015) 90 tablet 3  . losartan (COZAAR) 50 MG tablet Take 1 tablet (50 mg total) by mouth daily. 60 tablet 2  . meloxicam (MOBIC) 15 MG tablet TAKE 1 TABLET BY MOUTH DAILY. 15 tablet PRN  . Multiple Vitamins-Minerals (MULTIVITAMIN WITH MINERALS) tablet Take 1 tablet by mouth daily.    Marland Kitchen NOVOLOG FLEXPEN 100 UNIT/ML FlexPen INJECT 5 UNITS INTO THE SKIN 4 TIMES DAILY - BEFORE MEALS AND AT BEDTIME. 15 mL 5  . Omega-3 Fatty Acids (FISH OIL) 1000 MG CAPS Take 1 capsule by mouth 2 (two) times daily.     . Simethicone 180 MG CAPS Take 1 capsule (180 mg total) by mouth 3 (three) times daily with meals as needed. For flatulence, take after meal 90 capsule 5  . traMADol (ULTRAM) 50 MG tablet TAKE 1 TABLET BY MOUTH EVERY 12 HOURS AS NEEDED (Patient not taking: Reported on 08/02/2015) 30 tablet 1  . triamterene-hydrochlorothiazide (MAXZIDE) 75-50 MG per tablet TAKE 1 TABLET BY MOUTH ONCE DAILY (Patient taking differently: Patient states he is taking 1/2 tablet daily) 45 tablet PRN  . TRUEPLUS LANCETS 33G MISC 1 Units by Does not apply route 3 (three) times daily.    Marland Kitchen  UNIFINE PENTIPS 31G X 8 MM MISC USE TO INJECT INSULIN 4 TIMES DAILY 100 each 11  . verapamil (CALAN-SR) 240 MG CR tablet Take 1 tablet (240 mg total) by mouth daily. 90 tablet 3   No current facility-administered medications for this visit.   Family History  Problem Relation Age of Onset  . Diabetes Father   . Kidney disease Father   . Heart failure Father   . Hyperlipidemia Father   . Hypertension Father   . Osteoarthritis Mother    Social History   Social History  . Marital Status: Significant Other    Spouse Name: N/A  . Number of Children: N/A  . Years of Education: N/A   Social History Main Topics  . Smoking status: Former Smoker -- 20 years    Types: Cigarettes    Quit date:  07/02/2005  . Smokeless tobacco: Never Used  . Alcohol Use: 4.2 oz/week    0 Standard drinks or equivalent, 7 Glasses of wine per week  . Drug Use: No  . Sexual Activity: Not Asked     Comment: declined condoms   Other Topics Concern  . 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  Balance of 10 point ROS was negative. Objective:  Physical Exam: Filed Vitals:   10/12/15 1357  BP: 134/68  Pulse: 67  Temp: 97.8 F (36.6 C)  TempSrc: Oral  Height: 6\' 2"  (1.88 m)  Weight: 219 lb 1.6 oz (99.383 kg)  SpO2: 99%   Physical Exam  Constitutional: He is oriented to person, place, and time and well-developed, well-nourished, and in no distress. No distress.  HENT:  Head: Normocephalic and atraumatic.  Eyes: EOM are normal. No scleral icterus.  Neck: No tracheal deviation present.  Cardiovascular: Normal rate, regular rhythm and normal heart sounds.   Pulmonary/Chest: Effort normal and breath sounds normal. No respiratory distress. He has no wheezes.  Abdominal: Soft. He exhibits no distension. There is no tenderness. There is no rebound and no guarding.  Musculoskeletal:  1+ pitting edema of right ankle. Trace edema to midshin. No calf pain or palpable cords.  Neurological: He is alert and oriented to person, place, and time.  Skin: Skin is warm and dry. No rash noted. He is not diaphoretic.     Assessment & Plan:   Patient and case were discussed with Dr. Lynnae January.  Please refer to Problem Based charting for further documentation.

## 2015-10-12 NOTE — Progress Notes (Signed)
Patient ID: Trevor Fischer, male   DOB: 18-Dec-1960, 55 y.o.   MRN: [id number] HPI: Trevor Fischer is a 55 y.o. male who walked into the office today with issue with pantoprazole and Odefsey.   Allergies: Allergies  Allergen Reactions  . Amoxicillin-Pot Clavulanate     REACTION: rash    Vitals: Temp: 97.8 F (36.6 C) (01/10 1357) Temp Source: Oral (01/10 1357) BP: 134/68 mmHg (01/10 1357) Pulse Rate: 67 (01/10 1357)  Past Medical History: Past Medical History  Diagnosis Date  . 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 Hyperlipidemia   . Degenerative joint disease of knee   . HIV infection ([place])   . Hypertension   . GERD (gastroesophageal reflux disease)   . Hx MRSA infection   . Allergic rhinitis   . Superficial thrombophlebitis   . Diabetes mellitus 9/09  . DVT (deep venous thrombosis) (HCC)     Ended anticoagulation in 2012    Social History: Social History   Social History  . Marital Status: Significant Other    Spouse Name: N/A  . Number of Children: N/A  . Years of Education: N/A   Social History Main Topics  . Smoking status: Former Smoker -- 20 years    Types: Cigarettes    Quit date: 07/02/2005  . Smokeless tobacco: Never Used  . Alcohol Use: 4.2 oz/week    0 Standard drinks or equivalent, 7 Glasses of wine per week  . Drug Use: No  . Sexual Activity: Not on file     Comment: declined condoms   Other Topics Concern  . Not on file   Social History Narrative   Lives in [place], with partner of 82 years    Works as Chartered certified accountant at [employer]   Has [employer]    Previous Regimen:   Current Regimen: [place]: HIV 1 RNA QUANT (copies/mL)  Date Value  06/21/2015 <20  09/22/2014 <20  03/05/2014 <20   CD4 T CELL ABS (/uL)  Date Value  06/21/2015 670  09/22/2014 750  03/05/2014 540   HEP B S AB (no units)  Date Value  11/26/2006 Yes   HEPATITIS B SURFACE AG (no units)  Date Value  10/14/2014 NEGATIVE    HCV AB (no units)  Date Value  10/14/2014 NEGATIVE    CrCl: CrCl cannot be calculated (Patient has no serum creatinine result on file.).  Lipids:    Component Value Date/Time   CHOL 126 06/21/2015 1356   TRIG 191* 06/21/2015 1356   HDL 28* 06/21/2015 1356   CHOLHDL 4.5 06/21/2015 1356   VLDL 38* 06/21/2015 1356   LDLCALC 60 06/21/2015 1356    Assessment: Trevor Fischer stopped by after his visit with internal medicine this AM for his cough issue. MD suspected GERD for the cough so PPI was plan to be used. He is currently on Odefsey so that would interact severely. Saw him today and explained different options to him. We are going to do DTG/Descovy due to the lact of interactions. He was pretty excited about the new regimen. I was trying to convince his partners to change to the same regimen when he comes back to see Dr. Tommy Fischer.   Recommendations: Tivicay 50mg  PO qday Descovy 1 PO qday Protonix 40mg  PO BID Rx sent to [facility], PharmD Clinical [facility] [place] for [facility] Disease 10/12/2015, 4:08 PM

## 2015-10-12 NOTE — Assessment & Plan Note (Addendum)
Patient has continued nonproductive cough.  He was restarted on his anti-allergy medication last visit in the first step towards treatment.  He has been taking Chlorpheniramine q4h, with mild improvement in his symptoms.  The cough, however, has not completely resolved.  He has a history of GERD, but has not been on an antacid in a long time.  His HIV is currently well-controlled on Odefsey.  A/P: Chronic cough.  Etiologies of chronic cough discussed with patient.  As postnasal drip has been effectively treated, GERD is the next most likely etiology of chronic cough in this patient.  However, patient is currently on HAART therapy for which PPI cannot be prescribed.  I personally spoke with Dr. Tommy Medal, who requested we not start patient on PPI therapy until he has seen the patient and changed his regimen as necessary.  This was explained to the patient who understands.  He agrees to see Dr. Tommy Medal and will call the clinic for his medication if/when his regimen has changed.  Patient understands that his cough may take up to 3+ months to resolve after initiation of PPI therapy. - Appointment with Dr. Tommy Medal re: HAART regimen amenable to co-administration of BID PPI - If/When Dr. Tommy Medal agrees with PPI therapy, will order Protonix 40 mg BID for 3 months - RTC 4 months.  Addendum 10/14/15: Dr. Derek Mound office has switched the patient to medications that can be used with PPI. - Protonix 40 mg BID

## 2015-10-12 NOTE — Assessment & Plan Note (Signed)
Lab Results  Component Value Date   HGBA1C 7.9 05/03/2015   HGBA1C 8.5 02/15/2015   HGBA1C 7.4 11/05/2014     Assessment: Diabetes control:  fair. Home glucometer readings averaging 135-156.  Expect A1c (to be [facility] this month at [facility] to be improved from previous. Progress toward A1C goal:    Comments: Patient reports good compliance with diet and insulin regimen. He brought in his glucometer, showing good average readings.  Plan: Medications:  continue current medications, Lantus, as well as diet Home glucose monitoring: Frequency:   Timing:   Instruction/counseling given: reminded to bring blood glucose meter & log to each visit Educational resources provided: brochure (denies) Self management tools provided:   Other plans: have A1c checked with other lab work at [facility]

## 2015-10-12 NOTE — Assessment & Plan Note (Signed)
Patient reports continued swelling of his right ankle following previous fracture.  He has seen Ortho, which counseled that swelling could take 1 year to go away. It is worse with prolonged standing.  He denies calf pain or swelling.  He denies chest pain or trouble breathing.  He has a h/o DVT and knows the signs/symptoms.  He takes ASA daily and usually wears thigh-high compression stockings.  A/P: Post-traumatic swelling.  No signs of acute DVT or PE.  Patient counseled to continue ASA and compression therapy.  He was counseled on s/sx of DVT and to seek medical care if one arises. - ASA - Compression stockings, 20-30 mmHg

## 2015-10-12 NOTE — Patient Instructions (Addendum)
1. Call Dr. Derek Mound office for appointment. 2. Call IM clinic and request the prescription for antacid (Protonix 40 mg).   3. Return to clinic in 4 month for recheck. 4. Recommend Jobst compression stockings, 20-30 mmHg.    Pantoprazole tablets What is this medicine? PANTOPRAZOLE (pan TOE pra zole) prevents the production of acid in the stomach. It is used to treat gastroesophageal reflux disease (GERD), inflammation of the esophagus, and Zollinger-Ellison syndrome. This medicine may be used for other purposes; ask your health care provider or pharmacist if you have questions. What should I tell my health care provider before I take this medicine? They need to know if you have any of these conditions: -liver disease -low levels of magnesium in the blood -an unusual or allergic reaction to omeprazole, lansoprazole, pantoprazole, rabeprazole, other medicines, foods, dyes, or preservatives -pregnant or trying to get pregnant -breast-feeding How should I use this medicine? Take this medicine by mouth. Swallow the tablets whole with a drink of water. Follow the directions on the prescription label. Do not crush, break, or chew. Take your medicine at regular intervals. Do not take your medicine more often than directed. Talk to your pediatrician regarding the use of this medicine in children. While this drug may be prescribed for children as young as 5 years for selected conditions, precautions do apply. Overdosage: If you think you have taken too much of this medicine contact a poison control center or emergency room at once. NOTE: This medicine is only for you. Do not share this medicine with others. What if I miss a dose? If you miss a dose, take it as soon as you can. If it is almost time for your next dose, take only that dose. Do not take double or extra doses. What may interact with this medicine? Do not take this medicine with any of the following  medications: -atazanavir -nelfinavir This medicine may also interact with the following medications: -ampicillin -delavirdine -erlotinib -iron salts -medicines for fungal infections like ketoconazole, itraconazole and voriconazole -methotrexate -mycophenolate mofetil -warfarin This list may not describe all possible interactions. Give your health care provider a list of all the medicines, herbs, non-prescription drugs, or dietary supplements you use. Also tell them if you smoke, drink alcohol, or use illegal drugs. Some items may interact with your medicine. What should I watch for while using this medicine? It can take several days before your stomach pain gets better. Check with your doctor or health care professional if your condition does not start to get better, or if it gets worse. You may need blood work done while you are taking this medicine. What side effects may I notice from receiving this medicine? Side effects that you should report to your doctor or health care professional as soon as possible: -allergic reactions like skin rash, itching or hives, swelling of the face, lips, or tongue -bone, muscle or joint pain -breathing problems -chest pain or chest tightness -dark yellow or brown urine -dizziness -fast, irregular heartbeat -feeling faint or lightheaded -fever or sore throat -muscle spasm -palpitations -redness, blistering, peeling or loosening of the skin, including inside the mouth -seizures -tremors -unusual bleeding or bruising -unusually weak or tired -yellowing of the eyes or skin Side effects that usually do not require medical attention (Report these to your doctor or health care professional if they continue or are bothersome.): -constipation -diarrhea -dry mouth -headache -nausea This list may not describe all possible side effects. Call your doctor for medical advice about  side effects. You may report side effects to FDA at 1-800-FDA-1088. Where  should I keep my medicine? Keep out of the reach of children. Store at room temperature between 15 and 30 degrees C (59 and 86 degrees F). Protect from light and moisture. Throw away any unused medicine after the expiration date. NOTE: This sheet is a summary. It may not cover all possible information. If you have questions about this medicine, talk to your doctor, pharmacist, or health care provider.    2016, Elsevier/Gold Standard. (2014-11-06 14:45:56)

## 2015-10-13 MED FILL — GABAPENTIN 300 MG CAPSULE: 300 | 30 days supply | Qty: 90 | Fill #1

## 2015-10-13 NOTE — Progress Notes (Signed)
Internal Medicine Clinic Attending  Case discussed with Dr. Vandenberghe at the time of the visit.  We reviewed the resident's history and exam and pertinent patient test results.  I agree with the assessment, diagnosis, and plan of care documented in the resident's note. 

## 2015-10-14 MED ORDER — PANTOPRAZOLE SODIUM 40 MG PO TBEC: 40.0000 mg | DELAYED_RELEASE_TABLET | Freq: Two times a day (BID) | ORAL | Status: DC

## 2015-10-14 MED FILL — TIVICAY 50 MG TABLET: 50 | 30 days supply | Qty: 30 | Fill #0

## 2015-10-14 NOTE — Addendum Note (Signed)
Addended by: Viviano Simas A on: 10/14/2015 08:40 AM   Modules accepted: Orders

## 2015-10-27 MED FILL — UNIFINE PENTIPS 8MM 31G: 31G X 8 MM | 25 days supply | Qty: 100 | Fill #2

## 2015-11-01 ENCOUNTER — Encounter: Payer: Self-pay | Admitting: Pharmacist

## 2015-11-01 ENCOUNTER — Ambulatory Visit (INDEPENDENT_AMBULATORY_CARE_PROVIDER_SITE_OTHER): Payer: Self-pay | Admitting: Family Medicine

## 2015-11-01 VITALS — BP 116/72 | Wt 212.0 lb

## 2015-11-01 DIAGNOSIS — E119 Type 2 diabetes mellitus without complications: Secondary | ICD-10-CM

## 2015-11-01 DIAGNOSIS — Z794 Long term (current) use of insulin: Secondary | ICD-10-CM

## 2015-11-01 NOTE — Progress Notes (Signed)
Subjective:  Patient presents today for 3 month diabetes follow-up as part of the employer-sponsored Link to Wellness program.  Current diabetes regimen includes Lantus 23 units SQ once daily and Novolog 5-10 units SQ with meals.  Patient also continues on daily ASA, ARB and statin.  Most recent MD follow-up was with Dr. [name] earlier this month.  Patient has a pending appt for Dr. Benjamine Mola in May. No major health changes at this time.  Several medication changes with HIV meds and GERD medication.      Assessment:  Diabetes: Most recent A1C was 8.8  % which is exceeding goal of less than 7%. Weight is decreased from last visit with me.    A1C is likely elevated due to several factors. He is working more and as a consequence is not exercising as frequently and he is not preparing his meals as often as he was before. He admits to eating more prepackaged foods and states he is eating more carbohydrates than usual.   CBG Review: Patient is checking 3-4 times daily. CBG averages are about 15-20 points higher than at last visit.   Patient is using the Accu-Chek meter which suggests bolus doses for him. He states that lately he has been giving 7-10 units Novolog with meals (up from a normal of 5). This coincides with higher pre-meal readings.   Denies hypoglycemia.   Low/High- 84/224   Lifestyle improvements:  Physical Activity-  Patient is working more frequently and during the week. He states that he is tired when he gets home and hasn't been doing any physical activity. He is walking while he is at work (works as a Scientist, research (medical) at [employer]).    Nutrition-  Patient states he has been working more lately and he states he has been eating "junk". He also admits to eating more concentrated sweets and convenience foods. He states he hasn't had as much time to cook and is eating more processed foods.    I will send A1C result to Dr. Benjamine Mola. Patient also requested that I send A1C results to [facility],  RD. I explained to patient that since we cannot result A1C as lab results into Epic anymore he may be better off having his PCP monitor A1C for him.   Follow up with me in 2 months.    Plan/Goals for Next Visit:  1. Call Dr. Marveen Reeks office and move your appointment so you can be seen in the next few weeks for insulin adjustment.  2. Change your eating habits so you are making healthier choices. Stay away from convenience foods, increase your vegetable & salad intake, avoid concentrated sweets like cookies and pies.  3. Get back into physical activity. Start walking 30 minutes every other day. Try to get your walk in before you start getting ready for bed in the morning.     Next appointment to see me is: Monday March 20th at 8 AM.    Truett Mainland. Donneta Romberg, PharmD, BCPS, CDE [facility] to [place] Coordinator [phone]

## 2015-11-04 ENCOUNTER — Other Ambulatory Visit: Payer: Self-pay | Admitting: *Deleted

## 2015-11-04 DIAGNOSIS — R143 Flatulence: Secondary | ICD-10-CM

## 2015-11-04 MED ORDER — SIMETHICONE 180 MG PO CAPS: 1.0000 | ORAL_CAPSULE | Freq: Three times a day (TID) | ORAL | Status: DC | PRN

## 2015-11-04 MED FILL — SIMETHICONE 180 MG SOFTGEL: 180 | 90 days supply | Qty: 270 | Fill #0

## 2015-11-08 NOTE — Progress Notes (Signed)
I have reviewed this pharmacist's note and agree  

## 2015-11-10 ENCOUNTER — Other Ambulatory Visit: Payer: Self-pay | Admitting: Student in an Organized Health Care Education/Training Program

## 2015-11-10 MED FILL — DESCOVY 200-25 MG TABS: 200-25 | 30 days supply | Qty: 30 | Fill #1

## 2015-11-10 MED FILL — NOVOLOG FLEXPEN SYRINGE: 100 | 30 days supply | Qty: 15 | Fill #4

## 2015-11-10 MED FILL — TIVICAY 50 MG TABLET: 50 | 30 days supply | Qty: 30 | Fill #1

## 2015-11-12 ENCOUNTER — Ambulatory Visit (INDEPENDENT_AMBULATORY_CARE_PROVIDER_SITE_OTHER): Payer: 59 | Admitting: Internal Medicine

## 2015-11-12 ENCOUNTER — Encounter: Payer: Self-pay | Admitting: Internal Medicine

## 2015-11-12 VITALS — BP 135/57 | HR 65 | Temp 98.4°F | Ht 74.0 in | Wt 217.7 lb

## 2015-11-12 DIAGNOSIS — E1140 Type 2 diabetes mellitus with diabetic neuropathy, unspecified: Secondary | ICD-10-CM

## 2015-11-12 DIAGNOSIS — E1142 Type 2 diabetes mellitus with diabetic polyneuropathy: Secondary | ICD-10-CM

## 2015-11-12 DIAGNOSIS — I10 Essential (primary) hypertension: Secondary | ICD-10-CM | POA: Diagnosis not present

## 2015-11-12 DIAGNOSIS — Z794 Long term (current) use of insulin: Secondary | ICD-10-CM

## 2015-11-12 DIAGNOSIS — S82491D Other fracture of shaft of right fibula, subsequent encounter for closed fracture with routine healing: Secondary | ICD-10-CM

## 2015-11-12 DIAGNOSIS — X58XXXD Exposure to other specified factors, subsequent encounter: Secondary | ICD-10-CM

## 2015-11-12 DIAGNOSIS — E1165 Type 2 diabetes mellitus with hyperglycemia: Secondary | ICD-10-CM | POA: Diagnosis not present

## 2015-11-12 DIAGNOSIS — S82831A Other fracture of upper and lower end of right fibula, initial encounter for closed fracture: Secondary | ICD-10-CM

## 2015-11-12 LAB — POCT GLYCOSYLATED HEMOGLOBIN (HGB A1C): Hemoglobin A1C: 8.7

## 2015-11-12 LAB — GLUCOSE, CAPILLARY: GLUCOSE-CAPILLARY: 255 mg/dL — AB (ref 65–99)

## 2015-11-12 MED ORDER — IBUPROFEN 600 MG PO TABS: 600.0000 mg | ORAL_TABLET | Freq: Three times a day (TID) | ORAL | Status: DC | PRN

## 2015-11-12 MED ORDER — ALCOHOL WIPES PADS: 1.0000 | MEDICATED_PAD | Freq: Four times a day (QID) | Status: DC | PRN

## 2015-11-12 MED FILL — IBUPROFEN 600 MG TABLET: 600 | 7 days supply | Qty: 20 | Fill #0

## 2015-11-12 MED FILL — SM ALCOHOL 70% PREP PADS: 90 days supply | Qty: 400 | Fill #0

## 2015-11-12 MED FILL — LOSARTAN POTASSIUM 50 MG TA: 50 | 90 days supply | Qty: 90 | Fill #0

## 2015-11-12 NOTE — Assessment & Plan Note (Signed)
Lab Results  Component Value Date   HGBA1C 8.7 11/12/2015   HGBA1C 7.9 05/03/2015   HGBA1C 8.5 02/15/2015     Assessment: Diabetes control:  Uncontrolled Comments: Complaint with lantus 23 u daily, and Novolog 5u QID. He says over the past 2 weeks hios readings have been better. Looking has his log- his readings over the past 2 weeks were indeed better, but were in the 200s earlier part of January. He attributes this to working outside more now that the weather is getting warmer, and he envisons he will be doing more now. He does admit to teh occasional sweets.  Per glucometer- 72 test, Average read- 151 lowest- 70, highest- 248. Fastings- low to mid 100s. He is aware when he becomes hypoglycemic- when his blood sugar is in the 80s.  Plan: Medications:  Cont current meds Other plans: Considering blood sugars are better controlled now. Pt to come in a month if his reading are in the 180s. An option would be to add a sliding scale - or rather than complicate his regimen or add another oral agent- DPP4 like januvia or injectables like GLP1s, pending what his insurance will cover. - Pt aggred to plan - Pt say he was on metformin at a time and it caused diarrhea, ?dose. He does not want to be on the med again.

## 2015-11-12 NOTE — Patient Instructions (Signed)
Please continue watching your diet, continue with exercise and with your medication.  If your blood sugar is consistently above 180 we will have to make adjustments to your regimen or add other medications for diabetes.  It was nice meeting you today.

## 2015-11-12 NOTE — Progress Notes (Signed)
Patient ID: Trevor Fischer, male   DOB: [birth year], 55 y.o.   MRN: [id number]   Subjective:   Patient ID: Trevor Fischer male   DOB: [birth year] 55 y.o.   MRN: [id number]  HPI: [name] is a 55 y.o. to follow up on his DM, says his sugars have been running high. Also to follow up on HTN.  Past Medical History  Diagnosis Date  . 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 Hyperlipidemia   . Degenerative joint disease of knee   . HIV infection ([place])   . Hypertension   . GERD (gastroesophageal reflux disease)   . Hx MRSA infection   . Allergic rhinitis   . Superficial thrombophlebitis   . Diabetes mellitus 9/09  . DVT (deep venous thrombosis) ([facility])     Ended anticoagulation in 2012   Review of Systems: CONSTITUTIONAL- No Fever, weightloss. SKIN- No Rash, colour changes or itching. HEAD- No Headache or dizziness. Mouth/throat- No Sorethroat RESPIRATORY- No Cough or SOB. CARDIAC- No Palpitations, or chest pain. GI- No vomiting, diarrhoea, abd pain. URINARY- No Frequency, or dysuria. NEUROLOGIC- No syncope, seizures. [facility]- Denies depression or anxiety.  Objective:  Physical Exam: Filed Vitals:   11/12/15 1456  BP: 135/57  Pulse: 65  Temp: 98.4 F (36.9 C)  TempSrc: Oral  Height: 6\' 2"  (1.88 m)  Weight: 217 lb 11.2 oz (98.748 kg)  SpO2: 100%   GENERAL- alert, co-operative, appears as stated age, not in any distress. HEENT- Atraumatic, normocephalic, PERRL, EOMI, oral mucosa appears moist, neck supple. CARDIAC- RRR, no murmurs, rubs or gallops. RESP- clear to auscultation bilaterally, no wheezes or crackles. ABDOMEN- Soft, nontender, bowel sounds present. NEURO- Alert and oriented Gait- Normal. EXTREMITIES- warm, no pedal edema. SKIN- Warm, dry, hyperpigmented scarring from previous zoster infection. PSYCH- Normal mood and affect, appropriate thought content and speech.  Assessment & Plan:   The patient's case and plan of care was  discussed with attending physician, Dr. Daryll Drown.  Please see problem based charting for assessment and plan.

## 2015-11-12 NOTE — Assessment & Plan Note (Signed)
Intermittent pain that responds to ibuprofen. It has Dm and HIV, increase risk of kidney injury. Pt cautioned about side effects. Requesting 800mg  strenght ibuprofen. He uses it very occasionally.  Plan- Reduce ibuprofen dose to 600mg  TID, #20 pills left.

## 2015-11-12 NOTE — Assessment & Plan Note (Signed)
BP Readings from Last 3 Encounters:  11/12/15 135/57  11/01/15 116/72  10/12/15 134/68    Lab Results  Component Value Date   NA 136 06/21/2015   K 4.4 06/21/2015   CREATININE 1.07 06/21/2015    Assessment: Blood pressure control:  Controlled Other plans: Cont Lorsatan 50mg  daily and verapamil 240mg  daily.

## 2015-11-16 MED FILL — GABAPENTIN 300 MG CAPSULE: 300 | 30 days supply | Qty: 180 | Fill #0

## 2015-11-16 MED FILL — UNIFINE PENTIPS 8MM 31G: 31G X 8 MM | 25 days supply | Qty: 100 | Fill #3

## 2015-11-16 MED FILL — ATORVASTATIN 40 MG TABLET: 40 | 90 days supply | Qty: 90 | Fill #1

## 2015-11-16 MED FILL — VERAPAMIL ER 240 MG TABLET: 240 | 90 days supply | Qty: 90 | Fill #2

## 2015-11-20 NOTE — Progress Notes (Signed)
Internal Medicine Clinic Attending  Case discussed with Dr. Emokpae soon after the resident saw the patient.  We reviewed the resident's history and exam and pertinent patient test results.  I agree with the assessment, diagnosis, and plan of care documented in the resident's note. 

## 2015-11-20 NOTE — Addendum Note (Signed)
Addended by: Gilles Chiquito B on: 11/20/2015 03:06 PM   Modules accepted: Level of Service

## 2015-12-01 ENCOUNTER — Encounter: Payer: Self-pay | Admitting: *Deleted

## 2015-12-08 MED FILL — IBUPROFEN 800 MG TABLET: 800 | 30 days supply | Qty: 90 | Fill #0

## 2015-12-08 MED FILL — TIVICAY 50 MG TABLET: 50 | 30 days supply | Qty: 30 | Fill #2

## 2015-12-08 MED FILL — DESCOVY 200-25 MG TABS: 200-25 | 30 days supply | Qty: 30 | Fill #2

## 2015-12-08 MED FILL — UNIFINE PENTIPS 8MM 31G: 31G X 8 MM | 25 days supply | Qty: 100 | Fill #4

## 2015-12-08 MED FILL — CYCLOBENZAPRINE 5 MG TABLET: 5 | 10 days supply | Qty: 30 | Fill #1

## 2015-12-20 ENCOUNTER — Encounter: Payer: Self-pay | Admitting: Pharmacist

## 2015-12-20 ENCOUNTER — Other Ambulatory Visit: Payer: Self-pay | Admitting: Internal Medicine

## 2015-12-20 ENCOUNTER — Ambulatory Visit (INDEPENDENT_AMBULATORY_CARE_PROVIDER_SITE_OTHER): Payer: Self-pay | Admitting: Family Medicine

## 2015-12-20 VITALS — BP 122/70 | Wt 216.2 lb

## 2015-12-20 DIAGNOSIS — E119 Type 2 diabetes mellitus without complications: Secondary | ICD-10-CM

## 2015-12-20 DIAGNOSIS — Z794 Long term (current) use of insulin: Secondary | ICD-10-CM

## 2015-12-20 MED FILL — NOVOLOG FLEXPEN SYRINGE: 100 | 75 days supply | Qty: 15 | Fill #5

## 2015-12-20 MED FILL — GABAPENTIN 300 MG CAPSULE: 300 | 30 days supply | Qty: 180 | Fill #1

## 2015-12-20 MED FILL — LANTUS SOLOSTAR 100 UNITS/M: 100 | 90 days supply | Qty: 30 | Fill #1

## 2015-12-20 NOTE — Progress Notes (Signed)
Subjective:  Patient presents today for 2 month diabetes follow-up as part of the employer-sponsored Link to Wellness program.  Current diabetes regimen includes Lantus 25 units SQ QAM & Novolog 8-12 units with meals.  Patient also continues on daily ASA, ACE Inhibitor and statin.  Most recent MD follow-up was with Dr. Denton Brick in February.  Patient has a pending appt with Dr. Benjamine Mola in May 2017.  No med changes or major health changes at this time.     Assessment:  Diabetes: Most recent A1C was 8.7 % which is exceeding goal of less than 7%. Weight is stable from last visit with me.    CBG Review: Patient reports that he is checking four times daily, before meals and at bedtime. 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 He states that lately it has been recommending 8 units with meals but he has been giving 10 units instead.   CBG averages are 20-30 points improved from last visit. Based on this I expect A1C to be lower at his next visit with PCP in 6 weeks. 7,14 day averages are better than 60 and 90 day averages by 10-20 points also.   High/Low- 202/56.   A few low blood sugars about a month. He thinks he got overestimated his carbohydrates and got too much insulin. Corrects with cookies or glass of milk. Other than that, no low blood sugars.   Fasting CBG are averaging 127.   Lifestyle improvements:  Physical Activity-  He states he hasn't been walking as much as he usually does. He would like to start walking again now that the weather is nicer.     Nutrition-  Patient reports that he has cut back to 1 scoop of ice cream. He states that he has cut back on concentrated sweets.   He also reports that he has cut back on buying "junk". He does most of the cooking. He reports that he is eating more fruits and vegetables.   He is counting carbohydrates and is usually getting 85 grams carbohydrates.    Follow up with me in 3 months.     Plan/Goals for Next Visit:  1. Start back with walking every morning after you get off work and get your walk in before you go to bed. Aim for at least 3 times a week for 30 minutes.  2. Continue to count carbohydrates and aim for 85 grams of carbohydrates per meal (RD recommendation).     Next appointment to see me is: Monday June 12th at 8 AM.    Trevor Fischer. Donneta Romberg, PharmD, BCPS, CDE Trevor Fischer to [place] Coordinator [phone]

## 2015-12-22 MED FILL — ACCU-CHEK MULTICLIX LANCETS: 34 days supply | Qty: 204 | Fill #0

## 2015-12-22 MED FILL — ACCU-CHEK AVIVA PLUS TEST S: 90 days supply | Qty: 100 | Fill #0

## 2016-01-03 MED FILL — UNIFINE PENTIPS 8MM 31G: 31G X 8 MM | 25 days supply | Qty: 100 | Fill #5

## 2016-01-05 ENCOUNTER — Other Ambulatory Visit: Payer: Self-pay | Admitting: Internal Medicine

## 2016-01-05 MED FILL — TIVICAY 50 MG TABLET: 50 | 30 days supply | Qty: 30 | Fill #3

## 2016-01-05 MED FILL — DESCOVY 200-25 MG TABS: 200-25 | 30 days supply | Qty: 30 | Fill #3

## 2016-01-05 MED FILL — ASPIR-LOW EC 81 MG TABLET: 81 | 90 days supply | Qty: 90 | Fill #0

## 2016-01-10 ENCOUNTER — Other Ambulatory Visit: Payer: 59

## 2016-01-10 DIAGNOSIS — B20 Human immunodeficiency virus [HIV] disease: Secondary | ICD-10-CM

## 2016-01-10 LAB — CBC WITH DIFFERENTIAL/PLATELET
BASOS ABS: 61 {cells}/uL (ref 0–200)
BASOS PCT: 1 %
EOS PCT: 4 %
Eosinophils Absolute: 244 cells/uL (ref 15–500)
HCT: 42.2 % (ref 38.5–50.0)
HEMOGLOBIN: 14.3 g/dL (ref 13.2–17.1)
LYMPHS ABS: 2562 {cells}/uL (ref 850–3900)
Lymphocytes Relative: 42 %
MCH: 26.9 pg — ABNORMAL LOW (ref 27.0–33.0)
MCHC: 33.9 g/dL (ref 32.0–36.0)
MCV: 79.3 fL — ABNORMAL LOW (ref 80.0–100.0)
MPV: 9.1 fL (ref 7.5–12.5)
Monocytes Absolute: 427 cells/uL (ref 200–950)
Monocytes Relative: 7 %
NEUTROS ABS: 2806 {cells}/uL (ref 1500–7800)
Neutrophils Relative %: 46 %
Platelets: 236 10*3/uL (ref 140–400)
RBC: 5.32 MIL/uL (ref 4.20–5.80)
RDW: 13.9 % (ref 11.0–15.0)
WBC: 6.1 10*3/uL (ref 3.8–10.8)

## 2016-01-10 MED FILL — PANTOPRAZOLE SOD DR 40 MG T: 40 | 90 days supply | Qty: 90 | Fill #1

## 2016-01-11 LAB — COMPLETE METABOLIC PANEL WITH GFR
ALBUMIN: 4.5 g/dL (ref 3.6–5.1)
ALK PHOS: 97 U/L (ref 40–115)
ALT: 40 U/L (ref 9–46)
AST: 29 U/L (ref 10–35)
BILIRUBIN TOTAL: 0.5 mg/dL (ref 0.2–1.2)
BUN: 20 mg/dL (ref 7–25)
CO2: 28 mmol/L (ref 20–31)
Calcium: 9.4 mg/dL (ref 8.6–10.3)
Chloride: 102 mmol/L (ref 98–110)
Creat: 1.13 mg/dL (ref 0.70–1.33)
GFR, Est African American: 84 mL/min (ref >=60)
GFR, Est Non African American: 72 mL/min (ref >=60)
GLUCOSE: 82 mg/dL (ref 65–99)
Potassium: 4.1 mmol/L (ref 3.5–5.3)
SODIUM: 140 mmol/L (ref 135–146)
TOTAL PROTEIN: 7.1 g/dL (ref 6.1–8.1)

## 2016-01-11 LAB — T-HELPER CELL (CD4) - (RCID CLINIC ONLY)
CD4 % Helper T Cell: 32 % — ABNORMAL LOW (ref 33–55)
CD4 T Cell Abs: 790 /uL (ref 400–2700)

## 2016-01-11 LAB — URINE CYTOLOGY ANCILLARY ONLY
CHLAMYDIA, DNA PROBE: NEGATIVE
NEISSERIA GONORRHEA: NEGATIVE

## 2016-01-11 LAB — RPR

## 2016-01-11 LAB — LIPID PANEL
Cholesterol: 163 mg/dL (ref 125–200)
HDL: 46 mg/dL (ref >=40)
LDL CALC: 107 mg/dL (ref <130)
Total CHOL/HDL Ratio: 3.5 Ratio (ref <=5.0)
Triglycerides: 49 mg/dL (ref <150)
VLDL: 10 mg/dL (ref <30)

## 2016-01-12 LAB — HIV-1 RNA QUANT-NO REFLEX-BLD: HIV-1 RNA Quant, Log: <1.3 Log copies/mL (ref <1.30)

## 2016-01-13 NOTE — Progress Notes (Signed)
I have reviewed this pharmacist's note and agree  

## 2016-01-24 ENCOUNTER — Ambulatory Visit (INDEPENDENT_AMBULATORY_CARE_PROVIDER_SITE_OTHER): Payer: 59 | Admitting: Infectious Disease

## 2016-01-24 VITALS — BP 130/76 | HR 59 | Temp 97.8°F | Wt 229.0 lb

## 2016-01-24 DIAGNOSIS — I10 Essential (primary) hypertension: Secondary | ICD-10-CM | POA: Diagnosis not present

## 2016-01-24 DIAGNOSIS — E881 Lipodystrophy, not elsewhere classified: Secondary | ICD-10-CM | POA: Diagnosis not present

## 2016-01-24 DIAGNOSIS — E1140 Type 2 diabetes mellitus with diabetic neuropathy, unspecified: Secondary | ICD-10-CM | POA: Diagnosis not present

## 2016-01-24 DIAGNOSIS — Z794 Long term (current) use of insulin: Secondary | ICD-10-CM

## 2016-01-24 DIAGNOSIS — B20 Human immunodeficiency virus [HIV] disease: Secondary | ICD-10-CM

## 2016-01-24 NOTE — Progress Notes (Signed)
Chief complaint: followup for HIV on new regimen of Tivicay and Descovy   Subjective:    Patient ID: Trevor Fischer, male    DOB: [birth year], 55 y.o.   MRN: [id number]  HPI  Trevor Fischer is a 55 y.o. male who is doing superbly well on his  Newer  antiviral regimen TIvicay and Descovy with  undetectable viral load and health cd4 count.  Change was made due to desire to give him PPI for cough while he was on ODEFSEY.  Lab Results  Component Value Date   HIV1RNAQUANT <20 01/10/2016   HIV1RNAQUANT <20 06/21/2015   HIV1RNAQUANT <20 09/22/2014   Lab Results  Component Value Date   CD4TABS 790 01/10/2016   CD4TABS 670 06/21/2015   CD4TABS 750 09/22/2014      Past Medical History  Diagnosis Date  . 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 Hyperlipidemia   . Degenerative joint disease of knee   . HIV infection ([place])   . Hypertension   . GERD (gastroesophageal reflux disease)   . Hx MRSA infection   . Allergic rhinitis   . Superficial thrombophlebitis   . Diabetes mellitus 9/09  . DVT (deep venous thrombosis) ([place])     Ended anticoagulation in 2012    Past Surgical History  Procedure Laterality Date  . Vein ligation and stripping    . Endovenous ablation saphenous vein w/ laser  07-17-2011 LEFT GRERATER SAPHENOUS VEIN AND STAB PHLEBECTOMIES   10-20   LEFT LEG    Family History  Problem Relation Age of Onset  . Diabetes Father   . Kidney disease Father   . Heart failure Father   . Hyperlipidemia Father   . Hypertension Father   . Osteoarthritis Mother       Social History   Social History  . Marital Status: Significant Other    Spouse Name: N/A  . Number of Children: N/A  . Years of Education: N/A   Social History Main Topics  . Smoking status: Former Smoker -- 20 years    Types: Cigarettes    Quit date: 07/02/2005  . Smokeless tobacco: Never Used  . Alcohol Use: 4.2 oz/week    0 Standard drinks or equivalent, 7 Glasses of wine per week   . Drug Use: No  . Sexual Activity: Not on file     Comment: declined condoms   Other Topics Concern  . Not on file   Social History Narrative   Lives in [place], with partner of 79 years    Works as Chartered certified accountant at [employer]   Has [employer]    Allergies  Allergen Reactions  . Amoxicillin-Pot Clavulanate     REACTION: rash     Current outpatient prescriptions:  .  ACCU-CHEK AVIVA PLUS test strip, USE AS INSTRUCTED, Disp: 100 each, Rfl: 9 .  Alcohol Swabs (ALCOHOL WIPES) PADS, 1 each by Does not apply route 4 (four) times daily as needed., Disp: 400 each, Rfl: 6 .  ASPIR-LOW 81 MG EC tablet, TAKE 1 TABLET BY MOUTH DAILY., Disp: 100 tablet, Rfl: 3 .  atorvastatin (LIPITOR) 40 MG tablet, Take 1 tablet (40 mg total) by mouth daily., Disp: 30 tablet, Rfl: 11 .  chlorpheniramine (CHLOR-TRIMETON) 4 MG tablet, Take 1 tablet (4 mg total) by mouth every 4 (four) hours as needed for allergies., Disp: 150 tablet, Rfl: 1 .  cyclobenzaprine (FLEXERIL) 5 MG tablet, TAKE 1 TABLET BY MOUTH 3 TIMES DAILY AS NEEDED  FOR MUSCLE SPASMS., Disp: 30 tablet, Rfl: PRN .  dolutegravir (TIVICAY) 50 MG tablet, Take 1 tablet (50 mg total) by mouth daily., Disp: 30 tablet, Rfl: 10 .  emtricitabine-tenofovir AF (DESCOVY) 200-25 MG tablet, Take 1 tablet by mouth daily., Disp: 30 tablet, Rfl: 10 .  gabapentin (NEURONTIN) 300 MG capsule, Take 2 capsules (600 mg total) by mouth 3 (three) times daily., Disp: 180 capsule, Rfl: 2 .  ibuprofen (ADVIL,MOTRIN) 600 MG tablet, Take 1 tablet (600 mg total) by mouth every 8 (eight) hours as needed., Disp: 20 tablet, Rfl: 0 .  Insulin Glargine (LANTUS SOLOSTAR) 100 UNIT/ML Solostar Pen, Inject 23 Units into the skin daily at 10 pm. (Patient taking differently: Inject 25 Units into the skin daily at 10 pm. ), Disp: 45 mL, Rfl: 3 .  Lancets (ACCU-CHEK MULTICLIX) lancets, USE TO CHECK BLOOD SUGAR 4 TO 6 TIMES DAILY. CODE E11.40. INSULIN DEPENDENT, Disp: 204 each, Rfl:  6 .  losartan (COZAAR) 50 MG tablet, TAKE 1 TABLET BY MOUTH DAILY., Disp: 180 tablet, Rfl: 3 .  Multiple Vitamins-Minerals (MULTIVITAMIN WITH MINERALS) tablet, Take 1 tablet by mouth daily., Disp: , Rfl:  .  NOVOLOG FLEXPEN 100 UNIT/ML FlexPen, INJECT 5 UNITS INTO THE SKIN 4 TIMES DAILY - BEFORE MEALS AND AT BEDTIME. (Patient taking differently: INJECT 8-12 UNITS INTO THE SKIN 4 TIMES DAILY - BEFORE MEALS AND AT BEDTIME.), Disp: 15 mL, Rfl: 5 .  Omega-3 Fatty Acids (FISH OIL) 1000 MG CAPS, Take 1 capsule by mouth 2 (two) times daily. , Disp: , Rfl:  .  pantoprazole (PROTONIX) 40 MG tablet, Take 1 tablet (40 mg total) by mouth 2 (two) times daily., Disp: 60 tablet, Rfl: 3 .  Simethicone 180 MG CAPS, Take 1 capsule (180 mg total) by mouth 3 (three) times daily with meals as needed. For flatulence, take after meal, Disp: 270 capsule, Rfl: 3 .  triamterene-hydrochlorothiazide (MAXZIDE) 75-50 MG per tablet, TAKE 1 TABLET BY MOUTH ONCE DAILY (Patient taking differently: Patient states he is taking 1/2 tablet daily), Disp: 45 tablet, Rfl: PRN .  TRUEPLUS LANCETS 33G MISC, 1 Units by Does not apply route 3 (three) times daily., Disp: , Rfl:  .  UNIFINE PENTIPS 31G X 8 MM MISC, USE TO INJECT INSULIN 4 TIMES DAILY, Disp: 100 each, Rfl: 11 .  verapamil (CALAN-SR) 240 MG CR tablet, Take 1 tablet (240 mg total) by mouth daily., Disp: 90 tablet, Rfl: 3   Review of Systems  Constitutional: Negative for fever, chills, diaphoresis, activity change, appetite change, fatigue and unexpected weight change.  HENT: Negative for congestion, rhinorrhea, sinus pressure, sneezing, sore throat and trouble swallowing.   Eyes: Negative for photophobia and visual disturbance.  Respiratory: Negative for cough, chest tightness, shortness of breath, wheezing and stridor.   Cardiovascular: Negative for palpitations and leg swelling.  Gastrointestinal: Negative for nausea, vomiting, abdominal pain, diarrhea, constipation, blood in  stool, abdominal distention and anal bleeding.  Endocrine: Negative for heat intolerance, polydipsia and polyuria.  Genitourinary: Negative for dysuria, hematuria, flank pain and difficulty urinating.  Musculoskeletal: Negative for myalgias, joint swelling and gait problem.  Skin: Negative for color change, pallor, rash and wound.  Neurological: Negative for dizziness, tremors, weakness and light-headedness.  Hematological: Negative for adenopathy. Does not bruise/bleed easily.  Psychiatric/Behavioral: Negative for behavioral problems, confusion, sleep disturbance, dysphoric mood, decreased concentration and agitation.       Objective:   Physical Exam  Constitutional: He is oriented to person, place, and time. He appears  well-developed and well-nourished. No distress.  HENT:  Head: Normocephalic and atraumatic.  Mouth/Throat: Oropharynx is clear and moist. No oropharyngeal exudate.  Eyes: Conjunctivae and EOM are normal. Pupils are equal, round, and reactive to light. No scleral icterus.  Neck: Normal range of motion. Neck supple. No JVD present.  Cardiovascular: Normal rate, regular rhythm and normal heart sounds.  Exam reveals no gallop and no friction rub.   No murmur heard. Pulmonary/Chest: Effort normal and breath sounds normal. No respiratory distress. He has no wheezes. He has no rales. He exhibits no tenderness.  Abdominal: He exhibits no distension and no mass. There is no tenderness. There is no rebound and no guarding.  Musculoskeletal: He exhibits no edema or tenderness.  Lymphadenopathy:    He has no cervical adenopathy.  Neurological: He is alert and oriented to person, place, and time. He has normal reflexes. He exhibits normal muscle tone. Coordination normal.  Skin: Skin is warm and dry. He is not diaphoretic. No erythema. No pallor.  Psychiatric: He has a normal mood and affect. His behavior is normal. Judgment and thought content normal.          Assessment &  Plan:   HIV continue Tivicay and Descovy RTC in 1 year   IDDM: followed by Dr. Benjamine Mola.   Lab Results  Component Value Date   HGBA1C 8.7 11/12/2015     Hyperlipidemia: on f lipitor and LDL is close to  goal.  Lipid Panel     Component Value Date/Time   CHOL 163 01/10/2016 1418   TRIG 49 01/10/2016 1418   HDL 46 01/10/2016 1418   CHOLHDL 3.5 01/10/2016 1418   VLDL 10 01/10/2016 1418   LDLCALC 107 01/10/2016 1418   Fibular fracture: healing  HTN: followed by Dr. Eligah East Vitals:   01/24/16 1421  BP: 130/76  Pulse: 59  Temp: 97.8 F (36.6 C)    Transaminitis: US abdomen unrevealing and no viral hepatitis. They have been in this range going back to 2011. I doubt medication effect. LFTs now normalized  I spent greater than 25  minutes with the patient including greater than 50% of time in face to face counsel of the patient re his new ARV regimen, IDDM, Hyperipidemia,ad in coordination of his care.

## 2016-01-26 ENCOUNTER — Other Ambulatory Visit: Payer: Self-pay | Admitting: Internal Medicine

## 2016-01-26 MED FILL — UNIFINE PENTIPS 8MM 31G: 31G X 8 MM | 25 days supply | Qty: 100 | Fill #6

## 2016-01-26 MED FILL — SM ALCOHOL 70% PREP PADS: 70 | 90 days supply | Qty: 400 | Fill #1

## 2016-01-28 MED FILL — SIMETHICONE 180 MG SOFTGEL: 180 | 90 days supply | Qty: 270 | Fill #1

## 2016-02-01 ENCOUNTER — Telehealth: Payer: Self-pay

## 2016-02-01 NOTE — Telephone Encounter (Signed)
Per patient he has been taking 8-10u of his novolg ever since meeting with his dietician after OV in March.  He is down to 1 dose of novolog due to increasing his dose and cannot get a refill until June because a 75 day supply was dispensed in April.  Patient requesting updated rx sent to [facility] indicating the dose increase.  Pharmacy states this may or may not allow for a refill still due to insurance policy.    Please advise or send an updated script.

## 2016-02-01 NOTE — Telephone Encounter (Signed)
Pt requesting the nurse to call pt back.

## 2016-02-02 MED ORDER — INSULIN ASPART 100 UNIT/ML FLEXPEN: 8.0000 [IU] | PEN_INJECTOR | Freq: Three times a day (TID) | SUBCUTANEOUS | Status: DC

## 2016-02-02 MED FILL — NOVOLOG FLEXPEN SYRINGE: 100 | 50 days supply | Qty: 15 | Fill #0

## 2016-02-02 NOTE — Telephone Encounter (Signed)
Acknowledged change in diabetes management per office visit in March. New prescription refill sent with note to pharmacy. If Trevor Fischer is out of insulin aspart and is not able to have this filled he should call back ASAP to avoid complications from uncontrolled diabetes.

## 2016-02-02 NOTE — Telephone Encounter (Signed)
Thank you, patient informed, he will call back with any issues at [facility]

## 2016-02-03 MED FILL — TIVICAY 50 MG TABLET: 50 | 30 days supply | Qty: 30 | Fill #4

## 2016-02-03 MED FILL — DESCOVY 200-25 MG TABS: 200-25 | 30 days supply | Qty: 30 | Fill #4

## 2016-02-08 MED FILL — LOSARTAN POTASSIUM 50 MG TA: 50 | 90 days supply | Qty: 90 | Fill #1

## 2016-02-14 MED FILL — GABAPENTIN 300 MG CAPSULE: 300 | 30 days supply | Qty: 180 | Fill #0

## 2016-02-14 MED FILL — IBUPROFEN 800 MG TABLET: 800 | 30 days supply | Qty: 90 | Fill #1

## 2016-02-14 MED FILL — UNIFINE PENTIPS 8MM 31G: 31G X 8 MM | 25 days supply | Qty: 100 | Fill #7

## 2016-02-18 ENCOUNTER — Ambulatory Visit (INDEPENDENT_AMBULATORY_CARE_PROVIDER_SITE_OTHER): Payer: 59 | Admitting: Internal Medicine

## 2016-02-18 ENCOUNTER — Encounter: Payer: Self-pay | Admitting: Internal Medicine

## 2016-02-18 ENCOUNTER — Encounter: Payer: 59 | Admitting: Internal Medicine

## 2016-02-18 VITALS — BP 135/78 | HR 62 | Temp 98.3°F | Ht 74.0 in | Wt 226.0 lb

## 2016-02-18 DIAGNOSIS — R059 Cough, unspecified: Secondary | ICD-10-CM

## 2016-02-18 DIAGNOSIS — G4710 Hypersomnia, unspecified: Secondary | ICD-10-CM | POA: Diagnosis not present

## 2016-02-18 DIAGNOSIS — R05 Cough: Secondary | ICD-10-CM

## 2016-02-18 DIAGNOSIS — E1140 Type 2 diabetes mellitus with diabetic neuropathy, unspecified: Secondary | ICD-10-CM

## 2016-02-18 DIAGNOSIS — I10 Essential (primary) hypertension: Secondary | ICD-10-CM

## 2016-02-18 DIAGNOSIS — E1165 Type 2 diabetes mellitus with hyperglycemia: Secondary | ICD-10-CM | POA: Diagnosis not present

## 2016-02-18 DIAGNOSIS — Z7189 Other specified counseling: Secondary | ICD-10-CM

## 2016-02-18 DIAGNOSIS — R400 Somnolence: Secondary | ICD-10-CM

## 2016-02-18 DIAGNOSIS — Z794 Long term (current) use of insulin: Secondary | ICD-10-CM | POA: Diagnosis not present

## 2016-02-18 DIAGNOSIS — B20 Human immunodeficiency virus [HIV] disease: Secondary | ICD-10-CM

## 2016-02-18 LAB — POCT GLYCOSYLATED HEMOGLOBIN (HGB A1C): Hemoglobin A1C: 8.1

## 2016-02-18 LAB — GLUCOSE, CAPILLARY: Glucose-Capillary: 151 mg/dL — ABNORMAL HIGH (ref 65–99)

## 2016-02-18 NOTE — Progress Notes (Signed)
Subjective:   Patient ID: Trevor Fischer male   DOB: 1961-03-12 55 y.o.   MRN: [id number]  HPI: Trevor Fischer is a 55 y.o. man with past medical history including HIV, diabetes, HTN otherwise detailed below presenting for follow up of his diabetes, hypertension, and chronic cough. Since his last appointment he increased daily insulin dose up to 25U lantus and 8-10U aspart QID since 12/10/15. He has no symptomatic complaints of headache, polydipsia, increased urination, or worsened neuropathy. His cough has been fully resolved since starting chlorpheniramine.  See problem based assessment and plan below for additional details.  Past Medical History  Diagnosis Date  . 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 Hyperlipidemia   . Degenerative joint disease of knee   . HIV infection ([place])   . Hypertension   . GERD (gastroesophageal reflux disease)   . Hx MRSA infection   . Allergic rhinitis   . Superficial thrombophlebitis   . Diabetes mellitus 9/09  . DVT (deep venous thrombosis) ([facility])     Ended anticoagulation in 2012   Current Outpatient Prescriptions  Medication Sig Dispense Refill  . ACCU-CHEK AVIVA PLUS test strip USE AS INSTRUCTED 100 each 9  . Alcohol Swabs (ALCOHOL WIPES) PADS 1 each by Does not apply route 4 (four) times daily as needed. 400 each 6  . Alcohol Swabs (SM ALCOHOL PREP) 70 % PADS   6  . ASPIR-LOW 81 MG EC tablet TAKE 1 TABLET BY MOUTH DAILY. 100 tablet 3  . atorvastatin (LIPITOR) 40 MG tablet Take 1 tablet (40 mg total) by mouth daily. 30 tablet 11  . chlorpheniramine (CHLOR-TRIMETON) 4 MG tablet Take 1 tablet (4 mg total) by mouth every 4 (four) hours as needed for allergies. 150 tablet 1  . cyclobenzaprine (FLEXERIL) 5 MG tablet TAKE 1 TABLET BY MOUTH 3 TIMES DAILY AS NEEDED FOR MUSCLE SPASMS. 30 tablet PRN  . dolutegravir (TIVICAY) 50 MG tablet Take 1 tablet (50 mg total) by mouth daily. 30 tablet 10  . emtricitabine-tenofovir AF (DESCOVY)  200-25 MG tablet Take 1 tablet by mouth daily. 30 tablet 10  . gabapentin (NEURONTIN) 300 MG capsule Take 2 capsules (600 mg total) by mouth 3 (three) times daily. 180 capsule 2  . ibuprofen (ADVIL,MOTRIN) 600 MG tablet Take 1 tablet (600 mg total) by mouth every 8 (eight) hours as needed. 20 tablet 0  . ibuprofen (ADVIL,MOTRIN) 800 MG tablet   1  . insulin aspart (NOVOLOG FLEXPEN) 100 UNIT/ML FlexPen Inject 8-10 Units into the skin 3 (three) times daily with meals. 15 mL 5  . Insulin Glargine (LANTUS SOLOSTAR) 100 UNIT/ML Solostar Pen Inject 23 Units into the skin daily at 10 pm. (Patient taking differently: Inject 25 Units into the skin daily at 10 pm. ) 45 mL 3  . Lancets (ACCU-CHEK MULTICLIX) lancets USE TO CHECK BLOOD SUGAR 4 TO 6 TIMES DAILY. CODE E11.40. INSULIN DEPENDENT 204 each 6  . losartan (COZAAR) 50 MG tablet TAKE 1 TABLET BY MOUTH DAILY. 180 tablet 3  . Multiple Vitamins-Minerals (MULTIVITAMIN WITH MINERALS) tablet Take 1 tablet by mouth daily.    . Omega-3 Fatty Acids (FISH OIL) 1000 MG CAPS Take 1 capsule by mouth 2 (two) times daily.     . pantoprazole (PROTONIX) 40 MG tablet Take 1 tablet (40 mg total) by mouth 2 (two) times daily. 60 tablet 3  . Simethicone 180 MG CAPS Take 1 capsule (180 mg total) by mouth 3 (three) times daily with  meals as needed. For flatulence, take after meal 270 capsule 3  . triamterene-hydrochlorothiazide (MAXZIDE) 75-50 MG per tablet TAKE 1 TABLET BY MOUTH ONCE DAILY (Patient taking differently: Patient states he is taking 1/2 tablet daily) 45 tablet PRN  . TRUEPLUS LANCETS 33G MISC 1 Units by Does not apply route 3 (three) times daily.    Marland Kitchen UNIFINE PENTIPS 31G X 8 MM MISC USE TO INJECT INSULIN 4 TIMES DAILY 100 each 11  . verapamil (CALAN-SR) 240 MG CR tablet Take 1 tablet (240 mg total) by mouth daily. 90 tablet 3   No current facility-administered medications for this visit.   Family History  Problem Relation Age of Onset  . Diabetes Father   .  Kidney disease Father   . Heart failure Father   . Hyperlipidemia Father   . Hypertension Father   . Osteoarthritis Mother    Social History   Social History  . Marital Status: Significant Other    Spouse Name: N/A  . Number of Children: N/A  . Years of Education: N/A   Social History Main Topics  . Smoking status: Former Smoker -- 20 years    Types: Cigarettes    Quit date: 07/02/2005  . Smokeless tobacco: Never Used  . Alcohol Use: 4.2 oz/week    0 Standard drinks or equivalent, 7 Glasses of wine per week  . Drug Use: No  . Sexual Activity: Not Asked     Comment: declined condoms   Other Topics Concern  . None   Social History Narrative   Lives in [place], with partner of 67 years    Works as Chartered certified accountant at [employer]   Has [employer]   Review of Systems: Review of Systems  Constitutional: Negative for malaise/fatigue.  Eyes: Negative for blurred vision.  Respiratory: Negative for cough.   Cardiovascular: Negative for chest pain.  Gastrointestinal: Negative for diarrhea.  Genitourinary: Negative for frequency.  Musculoskeletal: Negative for myalgias.  Skin: Negative for rash.  Neurological: Negative for sensory change and headaches.  Endo/Heme/Allergies: Positive for environmental allergies.  Psychiatric/Behavioral: The patient does not have insomnia.     Objective:  Physical Exam: Filed Vitals:   02/18/16 0931 02/18/16 1010  BP: 153/70 135/78  Pulse: 74 62  Temp: 98.3 F (36.8 C)   TempSrc: Oral   Height: 6\' 2"  (1.88 m)   Weight: 226 lb (102.513 kg)   SpO2: 98%    GENERAL- alert, co-operative, NAD HEENT- Atraumatic, oral mucosa appears moist, no cervical LN enlargement. CARDIAC- RRR, no murmurs, rubs or gallops. RESP- CTAB, no wheezes or crackles. NEURO- Strength upper and lower extremities- 5/5, Sensation intact globally EXTREMITIES- pulse 2+, R ankle mildly swollen > left, nonpitting SKIN- Warm, dry, No rash or lesion. PSYCH-  Normal mood and affect, appropriate thought content and speech.  Assessment & Plan:

## 2016-02-18 NOTE — Patient Instructions (Signed)
Your Hemoglobin A1c is 8.1% today, down from 8.7%. This is still not quite at goal but is going the right direction 2 months after a change in your insulin regimen. I would recommend continuing your current treatment plan plus remaining physically active until we reassess your progress next time.  For your snoring and reported sleep disordered breathing I have placed a referral to sleep medicine. You will be called about arranging this sleep study.  I am glad to hear your cough has gotten better on allergy medicine. I would recommend continuing this until your symptoms are controlled for a month or two, then you may try stopping the medicine and seeing if symptoms return. If so you may resume antihistamines.

## 2016-02-20 DIAGNOSIS — Z0000 Encounter for general adult medical examination without abnormal findings: Secondary | ICD-10-CM | POA: Insufficient documentation

## 2016-02-20 DIAGNOSIS — G4726 Circadian rhythm sleep disorder, shift work type: Secondary | ICD-10-CM | POA: Insufficient documentation

## 2016-02-20 NOTE — Assessment & Plan Note (Signed)
Mr. Macklin brought paperwork specifying his partner as designated HCPOA which I acknowledged and scanned copy will be placed in his record.

## 2016-02-20 NOTE — Assessment & Plan Note (Signed)
Assessment: Cough now entirely improved since last visit. He attributes this to chlorpheniramine as the timing fits with his symptom improvement. Of note he was also started on PPI therapy after clearance for taking along with ARTs. He is not interested in discontinuing either agent at this time to reassess their need.  Plan: -Continue chlorpheniramine and omeprazole at present -Recommended he try weaning anithistamine and reassess cough symptoms.

## 2016-02-20 NOTE — Assessment & Plan Note (Signed)
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 His partner reports increased volume and frequency of snoring with noticing periods of prolonged apnea up to 20 seconds. He denies any symptoms of morning headaches. HE does not recall any episodes of waking up at night due to shortness of breath. No history of previous sleep studies.  Plan: -Referral for polysomnography

## 2016-02-20 NOTE — Assessment & Plan Note (Signed)
BP Readings from Last 3 Encounters:  02/18/16 135/78  01/24/16 130/76  12/20/15 122/70    Lab Results  Component Value Date   NA 140 01/10/2016   K 4.1 01/10/2016   CREATININE 1.13 01/10/2016    Assessment: Blood pressure control: Controlled Progress toward BP goal:  At goal Comments: No difficulties with medications or symptoms over the interval.  Plan: Medications:  continue current medications Educational resources provided: other (see comments) (declined) Self management tools provided:   Other plans: Plans for RTC in 3 months

## 2016-02-20 NOTE — Assessment & Plan Note (Signed)
Lab Results  Component Value Date   HGBA1C 8.1 02/18/2016   HGBA1C 8.7 11/12/2015   HGBA1C 7.9 05/03/2015     Assessment: Diabetes control: Uncontrolled Progress toward A1C goal:  Partially improved Comments: Mr. Hinrichsen has been struggling with his diabetes over the last few months and is making some progress with increased insulin doses. However he has continued to gain weight slowly along with this. He does not tolerate metformin due to GI side effects. At this point he may be a good candidate for considering starting a GLP-1 agent. Glucometer not available for review today.  Plan: Medications:  Continue Lantus 25U plus aspart 8-10U QID Home glucose monitoring: Frequency: 4 times daily Timing: Before meals Instruction/counseling given: reminded to bring blood glucose meter & log to each visit Educational resources provided: other (see comments) (declined) Other plans: Most likely start additional treatment possibly luraglutide at next appointment if similar progress with diabetes continues.

## 2016-02-22 NOTE — Progress Notes (Signed)
Internal Medicine Clinic Attending  Case discussed with Dr. Rice at the time of the visit.  We reviewed the resident's history and exam and pertinent patient test results.  I agree with the assessment, diagnosis, and plan of care documented in the resident's note.  

## 2016-02-29 MED FILL — CYCLOBENZAPRINE 5 MG TABLET: 5 | 10 days supply | Qty: 30 | Fill #2

## 2016-02-29 MED FILL — ATORVASTATIN 40 MG TABLET: 40 | 90 days supply | Qty: 90 | Fill #2

## 2016-02-29 MED FILL — VERAPAMIL ER 240 MG TABLET: 240 | 90 days supply | Qty: 90 | Fill #3

## 2016-03-03 MED FILL — TIVICAY 50 MG TABLET: 50 | 30 days supply | Qty: 30 | Fill #5

## 2016-03-03 MED FILL — DESCOVY 200-25 MG TABS: 200-25 | 30 days supply | Qty: 30 | Fill #5

## 2016-03-06 MED FILL — ACCU-CHEK AVIVA PLUS TEST S: 25 days supply | Qty: 100 | Fill #1

## 2016-03-06 MED FILL — UNIFINE PENTIPS 8MM 31G: 31G X 8 MM | 25 days supply | Qty: 100 | Fill #8

## 2016-03-13 ENCOUNTER — Other Ambulatory Visit: Payer: Self-pay | Admitting: Pharmacist

## 2016-03-13 ENCOUNTER — Encounter: Payer: Self-pay | Admitting: Pharmacist

## 2016-03-13 VITALS — BP 128/78 | Wt 220.0 lb

## 2016-03-13 DIAGNOSIS — Z794 Long term (current) use of insulin: Principal | ICD-10-CM

## 2016-03-13 DIAGNOSIS — E119 Type 2 diabetes mellitus without complications: Secondary | ICD-10-CM

## 2016-03-13 NOTE — Patient Outreach (Signed)
Subjective:  Patient presents today for 3 month diabetes follow-up as part of the employer-sponsored Link to Wellness program.  Current diabetes regimen includes Lantus 25 units SQ once daily, Novolog 8-10 units with meals (4 times daily). Patient also continues on daily ASA, ARB and statin.  Most recent MD follow-up was with Dr. Benjamine Mola in May. A1C at that visit was 8.1%. No changes were made at that visit.  Patient has a pending appt for August with Dr. Benjamine Mola.  No major health changes at this time. Lantus was increased since last visit to 25 units once daily.     Assessment:  Diabetes: Most recent A1C was  8.1 % which is exceeding goal of less than 7%. Weight is stable from last visit with me.    CBG Review: Patient is checking 3-4 times daily.   He denies hypoglycemia. He does report some high readings- he reports that he just doesn't feel right.   CBGs have improved since May when his A1C was taken (8.1%).   High/Low- 208/70; Fasting- 70-110  Lifestyle improvements:  Physical Activity-  Patient reports that he has been doing more yard work- he is cutting the grass every 3 days- he usually spends several hours doing yard work (mowing, [employer], etc). He is also walking some.   Nutrition-  No major changes with eating habits. He does report eating more fruits and vegetables. He has been counting carbohydrates with meals and he reports he is usually getting 60-90 grams with meals.    Follow up with me in 3 months.    Plan/Goals for Next Visit:  1. Continue with the yard work several times a week for at least 60 minutes.     Next appointment to see me is:  Monday July 31st at [facility] Donneta Romberg, PharmD, BCPS, CDE [facility] to [facility] Coordinator [phone]

## 2016-03-21 ENCOUNTER — Other Ambulatory Visit: Payer: Self-pay | Admitting: Infectious Disease

## 2016-03-21 DIAGNOSIS — I10 Essential (primary) hypertension: Secondary | ICD-10-CM

## 2016-03-21 MED FILL — TRIAMTERENE/HCTZ 75/50 TAB: 75-50 | 45 days supply | Qty: 45 | Fill #0

## 2016-03-21 MED FILL — GABAPENTIN 300 MG CAPSULE: 300 | 30 days supply | Qty: 180 | Fill #1

## 2016-03-21 MED FILL — NOVOLOG FLEXPEN SYRINGE: 100 | 50 days supply | Qty: 15 | Fill #1

## 2016-03-28 MED FILL — CYCLOBENZAPRINE 5 MG TABLET: 5 | 10 days supply | Qty: 30 | Fill #3

## 2016-04-03 MED FILL — DESCOVY 200-25 MG TABS: 200-25 | 30 days supply | Qty: 30 | Fill #6

## 2016-04-03 MED FILL — UNIFINE PENTIPS 8MM 31G: 31G X 8 MM | 25 days supply | Qty: 100 | Fill #9

## 2016-04-03 MED FILL — TIVICAY 50 MG TABLET: 50 | 30 days supply | Qty: 30 | Fill #6

## 2016-04-10 MED FILL — ASPIR-LOW EC 81 MG TABLET: 81 | 90 days supply | Qty: 90 | Fill #1

## 2016-04-10 MED FILL — PANTOPRAZOLE SOD DR 40 MG T: 40 | 60 days supply | Qty: 60 | Fill #2

## 2016-04-16 ENCOUNTER — Ambulatory Visit (HOSPITAL_BASED_OUTPATIENT_CLINIC_OR_DEPARTMENT_OTHER): Payer: 59 | Attending: Student in an Organized Health Care Education/Training Program | Admitting: Internal Medicine

## 2016-04-16 VITALS — Ht 74.0 in | Wt 220.0 lb

## 2016-04-16 DIAGNOSIS — R0683 Snoring: Secondary | ICD-10-CM | POA: Insufficient documentation

## 2016-04-16 DIAGNOSIS — E11311 Type 2 diabetes mellitus with unspecified diabetic retinopathy with macular edema: Secondary | ICD-10-CM | POA: Diagnosis not present

## 2016-04-16 DIAGNOSIS — I4891 Unspecified atrial fibrillation: Secondary | ICD-10-CM | POA: Insufficient documentation

## 2016-04-16 DIAGNOSIS — G4733 Obstructive sleep apnea (adult) (pediatric): Secondary | ICD-10-CM | POA: Diagnosis not present

## 2016-04-17 ENCOUNTER — Other Ambulatory Visit (HOSPITAL_BASED_OUTPATIENT_CLINIC_OR_DEPARTMENT_OTHER): Payer: Self-pay

## 2016-04-17 DIAGNOSIS — R400 Somnolence: Secondary | ICD-10-CM

## 2016-04-17 MED FILL — CYCLOBENZAPRINE 5 MG TABLET: 5 | 10 days supply | Qty: 30 | Fill #4

## 2016-04-17 MED FILL — SM ALCOHOL 70% PREP PADS: 70 | 90 days supply | Qty: 400 | Fill #2

## 2016-04-17 MED FILL — SIMETHICONE 180 MG SOFTGEL: 180 | 90 days supply | Qty: 270 | Fill #2

## 2016-04-20 MED FILL — UNIFINE PENTIPS 8MM 31G: 31G X 8 MM | 25 days supply | Qty: 100 | Fill #10

## 2016-04-20 MED FILL — ACCU-CHEK MULTICLIX LANCETS: 34 days supply | Qty: 204 | Fill #1

## 2016-04-20 MED FILL — GABAPENTIN 300 MG CAPSULE: 300 | 30 days supply | Qty: 180 | Fill #0

## 2016-04-22 DIAGNOSIS — E11311 Type 2 diabetes mellitus with unspecified diabetic retinopathy with macular edema: Secondary | ICD-10-CM | POA: Diagnosis not present

## 2016-04-22 NOTE — Procedures (Signed)
   Patient Name: Traeson, Ceci Date: 04/16/2016 Gender: Male D.O.B: September 27, 1961 Age (years): 54 Referring Provider: Axel Filler Height (inches): 74 Interpreting Physician: Baird Lyons MD, ABSM Weight (lbs): 220 RPSGT: Madelon Lips BMI: 28 MRN: [id number] Neck Size: 15.75 CLINICAL INFORMATION Sleep Study Type: NPSG Indication for sleep study: Snoring, Witnesses Apnea / Gasping During Sleep Epworth Sleepiness Score: 12  SLEEP STUDY TECHNIQUE As per the AASM Manual for the Scoring of Sleep and Associated Events v2.3 (April 2016) with a hypopnea requiring 4% desaturations. The channels recorded and monitored were frontal, central and occipital EEG, electrooculogram (EOG), submentalis EMG (chin), nasal and oral airflow, thoracic and abdominal wall motion, anterior tibialis EMG, snore microphone, electrocardiogram, and pulse oximetry.  MEDICATIONS Patient's medications include: charted for review. Medications self-administered by patient during sleep study : No sleep medicine administered.  SLEEP ARCHITECTURE The study was initiated at 11:15:32 PM and ended at 5:16:58 AM. Sleep onset time was 0.2 minutes and the sleep efficiency was 95.2%. The total sleep time was 344.3 minutes. Stage REM latency was 48.5 minutes. The patient spent 2.47% of the night in stage N1 sleep, 70.52% in stage N2 sleep, 0.00% in stage N3 and 27.01% in REM. Alpha intrusion was absent. Supine sleep was 35.63%.  RESPIRATORY PARAMETERS The overall apnea/hypopnea index (AHI) was 5.8 per hour. There were 20 total apneas, including 18 obstructive, 2 central and 0 mixed apneas. There were 13 hypopneas and 0 RERAs. The AHI during Stage REM sleep was 16.8 per hour. AHI while supine was 8.3 per hour. The mean oxygen saturation was 96.87%. The minimum SpO2 during sleep was 91.00%. Loud snoring was noted during this study.  CARDIAC DATA The 2 lead EKG demonstrated sinus rhythm, atrial  fibrillation, pacemaker generated. The mean heart rate was 65.22 beats per minute. Other EKG findings include: None.  LEG MOVEMENT DATA The total PLMS were 1 with a resulting PLMS index of 0.17. Associated arousal with leg movement index was 0.0 .  IMPRESSIONS - Minimal obstructive sleep apnea occurred during this study (AHI = 5.8/h). - No significant central sleep apnea occurred during this study (CAI = 0.3/h). - The patient had minimal or no oxygen desaturation during the study (Min O2 = 91.00%) - The patient snored with Loud snoring volume. - No cardiac abnormalities were noted during this study. - Clinically significant periodic limb movements did not occur during sleep. No significant associated arousals.  DIAGNOSIS - Obstructive Sleep Apnea (327.23 [G47.33 ICD-10])  RECOMMENDATIONS - Positional therapy avoiding supine position during sleep. - Very mild obstructive sleep apnea. Return to provider to discuss treatment options. - Avoid alcohol, sedatives and other CNS depressants that may worsen sleep apnea and disrupt normal sleep architecture. - Sleep hygiene should be reviewed to assess factors that may improve sleep quality. - Weight management and regular exercise should be initiated or continued if appropriate.  [Electronically signed] 04/22/2016 10:39 AM  Baird Lyons MD, ABSM Diplomate, American Board of Sleep Medicine   NPI: [id number] [place], American Board of Sleep Medicine  ELECTRONICALLY SIGNED ON:  04/22/2016, 10:35 AM [place] PH: [phone]   FX: [phone] [place]

## 2016-05-01 ENCOUNTER — Encounter: Payer: Self-pay | Admitting: Pharmacist

## 2016-05-01 ENCOUNTER — Other Ambulatory Visit: Payer: Self-pay | Admitting: Pharmacist

## 2016-05-01 VITALS — BP 130/70 | Wt 222.0 lb

## 2016-05-01 DIAGNOSIS — E119 Type 2 diabetes mellitus without complications: Secondary | ICD-10-CM

## 2016-05-01 DIAGNOSIS — Z794 Long term (current) use of insulin: Principal | ICD-10-CM

## 2016-05-01 MED FILL — LANTUS SOLOSTAR 100 UNITS/M: 100 | 90 days supply | Qty: 30 | Fill #2

## 2016-05-01 MED FILL — TIVICAY 50 MG TABLET: 50 | 30 days supply | Qty: 30 | Fill #7

## 2016-05-01 MED FILL — DESCOVY 200-25 MG TABS: 200-25 | 30 days supply | Qty: 30 | Fill #7

## 2016-05-01 MED FILL — CYCLOBENZAPRINE 5 MG TABLET: 5 | 10 days supply | Qty: 30 | Fill #5

## 2016-05-01 MED FILL — NOVOLOG FLEXPEN SYRINGE: 100 | 88 days supply | Qty: 36 | Fill #2

## 2016-05-01 MED FILL — ACCU-CHEK AVIVA PLUS TEST S: 90 days supply | Qty: 400 | Fill #2

## 2016-05-01 MED FILL — LOSARTAN POTASSIUM 50 MG TA: 50 | 90 days supply | Qty: 90 | Fill #2

## 2016-05-01 NOTE — Patient Outreach (Signed)
Subjective:  Patient presents today for 3 month diabetes follow-up as part of the employer-sponsored [employer] to Wellness program.  Current diabetes regimen includes Lantus 25 units SQ once daily and Novolog 8-10 units four times daily with meals/bedtime.  Patient also continues on daily ASA, ARB and statin.  Most recent MD follow-up was with Dr. Benjamine Mola in May 2017. A1C at that visit was 8.1%. Patient has a pending appt for August 2017. No med changes or major health changes at this time.   Patient came with questions regarding statin use. He thinks that the medication may not be worth it given the side effects that he has heard about. He is complaining of some muscle cramps in his legs, but these are relieved with cyclobenzaprine. I explained to him that continuing to take the statin will reduce his risk of having a heart attack or stroke. I am also not sure if the leg cramps he is having are directly related to the statin or more stemming from an injury he sustained last summer. Patient agreed to continue taking atorvastatin.   Assessment:  Diabetes: Most recent A1C was 8.1% which is exceeding goal of less than 7%. Weight is stable from last visit with me.    CBG Review: Patient is checking 3-4 times daily. He is still using the [place] expert that has a bolus wizard to help him calculate bolus dosing.   High/Low- 244/76   CBG averages are a somewhat higher (10-20 points) than at last visit. Patient denies hypoglycemia.   Fasting average is 114 mg/dL for the last 30 days. Pre-meal averages are 130-150s.   Lifestyle improvements:  Physical Activity-  He is still working in the yard, 3-4 days a week for 1-4 hours. He is cutting the grass (old fashioned non motorized lawn mover), weeding, trimming, pruning.  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 He is averaging 4000 steps each night he works.   Nutrition-  Patient reports it is about the same. He would like to try avoid  processed food and refined sugars. He is still counting carbohydrates. We discussed having him go and see the RD at the internal medicine clinic again so that he can claim his healthy weight badge in the [employer] program.   Follow up with me in 5 months when I return from maternity leave.    Plan/Goals for Next Visit:  1. Record your physical activity into the [employer] website to be able to claim 60 workouts. Finish the wellness profile so you can claim that badge (another $50!).  2. Make an appointment to see [name], RD so you can claim the weight badge.  3. Continue physical activity.    Next appointment to see me is: Monday January 8th at 8 AM.    Jinny Blossom D. Donneta Romberg, PharmD, BCPS, CDE [facility] to Page Coordinator [phone]

## 2016-05-16 MED FILL — UNIFINE PENTIPS 8MM 31G: 31G X 8 MM | 25 days supply | Qty: 100 | Fill #11

## 2016-05-16 MED FILL — CYCLOBENZAPRINE 5 MG TABLET: 5 | 10 days supply | Qty: 30 | Fill #6

## 2016-05-23 ENCOUNTER — Other Ambulatory Visit: Payer: Self-pay | Admitting: Internal Medicine

## 2016-05-25 ENCOUNTER — Telehealth: Payer: Self-pay | Admitting: Internal Medicine

## 2016-05-25 MED FILL — ATORVASTATIN 40 MG TABLET: 40 | 30 days supply | Qty: 30 | Fill #0

## 2016-05-25 NOTE — Telephone Encounter (Signed)
Pt's (sig other) upset rx not at pharmacy.  Pt has an appt tomorrow with pcp.  Pt given one refill and pcp can authorize additional refills at visit.Regenia Skeeter, Samaira Holzworth Cassady8/24/20178:55 AM

## 2016-05-25 NOTE — Telephone Encounter (Signed)
APT. REMINDER CALL, LMTCB °

## 2016-05-26 ENCOUNTER — Ambulatory Visit (INDEPENDENT_AMBULATORY_CARE_PROVIDER_SITE_OTHER): Payer: 59 | Admitting: Internal Medicine

## 2016-05-26 ENCOUNTER — Encounter: Payer: Self-pay | Admitting: Internal Medicine

## 2016-05-26 VITALS — BP 138/83 | HR 67 | Temp 98.0°F | Ht 74.0 in | Wt 227.5 lb

## 2016-05-26 DIAGNOSIS — G4730 Sleep apnea, unspecified: Secondary | ICD-10-CM | POA: Diagnosis not present

## 2016-05-26 DIAGNOSIS — E1140 Type 2 diabetes mellitus with diabetic neuropathy, unspecified: Secondary | ICD-10-CM | POA: Diagnosis not present

## 2016-05-26 DIAGNOSIS — Z79899 Other long term (current) drug therapy: Secondary | ICD-10-CM

## 2016-05-26 DIAGNOSIS — Z794 Long term (current) use of insulin: Secondary | ICD-10-CM | POA: Diagnosis not present

## 2016-05-26 DIAGNOSIS — R400 Somnolence: Secondary | ICD-10-CM

## 2016-05-26 DIAGNOSIS — I10 Essential (primary) hypertension: Secondary | ICD-10-CM | POA: Diagnosis not present

## 2016-05-26 DIAGNOSIS — Z87891 Personal history of nicotine dependence: Secondary | ICD-10-CM

## 2016-05-26 LAB — POCT GLYCOSYLATED HEMOGLOBIN (HGB A1C): Hemoglobin A1C: 8.5

## 2016-05-26 LAB — GLUCOSE, CAPILLARY: Glucose-Capillary: 141 mg/dL — ABNORMAL HIGH (ref 65–99)

## 2016-05-26 MED ORDER — LIRAGLUTIDE 18 MG/3ML ~~LOC~~ SOPN: 0.6000 mg | PEN_INJECTOR | Freq: Every day | SUBCUTANEOUS | 3 refills | Status: DC

## 2016-05-26 MED FILL — VICTOZA 2-PAK 18 MG/3 ML PE: 18 | 30 days supply | Qty: 6 | Fill #0

## 2016-05-26 NOTE — Patient Instructions (Signed)
It was a pleasure to see you today [name]. We talked about a few things we can work on for your diabetes control.   First try to limit simple sugars in the diet and aim for exercising 3 times weekly.  Secondly we will increase your basal insulin dose to 30 units daily from 25 units. Make sure to check your blood sugars while doing this to make sure you are not going to become hypoglycemic.  Thirdly I will check on injectable versus oral diabetes medications and call this to your pharmacy next week. I will update you with this once I know exactly what the prescription will be.  I will see you again in a month to see how we are doing with these changes and continue adjusting from there.

## 2016-05-27 LAB — BMP8+ANION GAP
ANION GAP: 15 mmol/L (ref 10.0–18.0)
BUN/Creatinine Ratio: 20 (ref 9–20)
BUN: 19 mg/dL (ref 6–24)
CO2: 25 mmol/L (ref 18–29)
CREATININE: 0.97 mg/dL (ref 0.76–1.27)
Calcium: 9.4 mg/dL (ref 8.7–10.2)
Chloride: 99 mmol/L (ref 96–106)
GFR calc Af Amer: 100 mL/min/{1.73_m2} (ref >59)
GFR, EST NON AFRICAN AMERICAN: 87 mL/min/{1.73_m2} (ref >59)
Glucose: 132 mg/dL — ABNORMAL HIGH (ref 65–99)
Potassium: 4.3 mmol/L (ref 3.5–5.2)
SODIUM: 139 mmol/L (ref 134–144)

## 2016-05-27 LAB — MICROALBUMIN / CREATININE URINE RATIO
Creatinine, Urine: 76.8 mg/dL
MICROALB/CREAT RATIO: 23.2 mg/g{creat} (ref 0.0–30.0)
Microalbumin, Urine: 17.8 ug/mL

## 2016-05-29 NOTE — Assessment & Plan Note (Signed)
Blood pressure is controlled today on losartan 50mg  and verapamil 240mg  daily. Metabolic panel checked without new abnormalities.  Plan to continue current medications

## 2016-05-29 NOTE — Progress Notes (Signed)
   CC: Follow up for diabetes  HPI:  Mr.Jim L Stepka is a 55 y.o. man with medical history detailed below presenting for a follow up of his diabetes. He has been feeling well over the past few months. He has been working multiple overtime shifts recently and feels that his sleep patterns and diet have been worse than usual. He is taking his medications as prescribed without difficulty.  He also complted a slep study recently that showed significant snoring but no significant apnea occurring.  Past Medical History:  Diagnosis Date  . Allergic rhinitis   . Degenerative joint disease of knee   . Diabetes mellitus 9/09  . DVT (deep venous thrombosis) ([place])    Ended anticoagulation in 2012  . GERD (gastroesophageal reflux disease)   . 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 HIV infection ([place])   . Hx MRSA infection   . Hyperlipidemia   . Hypertension   . Superficial thrombophlebitis     Review of Systems:  Review of Systems  Constitutional: Negative for weight loss.  Eyes: Negative for blurred vision.  Cardiovascular: Positive for leg swelling.  Genitourinary: Negative for frequency.  Neurological: Negative for headaches.  Endo/Heme/Allergies: Negative for polydipsia.    Physical Exam:  Vitals:   05/26/16 1353  BP: 138/83  Pulse: 67  Temp: 98 F (36.7 C)  TempSrc: Oral  SpO2: 100%  Weight: 227 lb 8 oz (103.2 kg)  Height: 6\' 2"  (1.88 m)    GENERAL- alert, co-operative, NAD  CARDIAC- RRR, no murmurs, rubs or gallops. RESP- CTAB, no wheezes or crackles. NEURO- Sensation intact globally EXTREMITIES- pulse 2+, R ankle mildly swollen SKIN- Warm, dry, No rash or lesion. PSYCH- Normal mood and affect, appropriate thought content and speech.   Assessment & Plan:   See Encounters Tab for problem based charting.  Patient discussed with Dr. Daryll Drown

## 2016-05-29 NOTE — Assessment & Plan Note (Signed)
Sleep study completed 7/16 that showed very mild sleep apnea. He has heavy snoring but unlikely this degree of apnea is causing a lot of symptoms. Sleepiness is more likely related to shift work and lots of overtime lately. Recommended interventions of trying to sleep on his side or front when able and working on weight.

## 2016-05-29 NOTE — Assessment & Plan Note (Signed)
Lab Results  Component Value Date   HGBA1C 8.5 05/26/2016   HGBA1C 8.1 02/18/2016   HGBA1C 8.7 11/12/2015     Assessment: Diabetes control: Uncontrolled Progress toward A1C goal:  Worse Comments: Trevor Fischer continues to struggle with getting good control despite compliance to medical therapy. Trevor Fischer is having more trouble with lifestyle and diet.  Plan: Medications:  Will start Victoza, increase lantus to 30U, continue aspart 8U QID Home glucose monitoring: Frequency: 3 times daily Timing: Before meals Instruction/counseling given: reminded to bring blood glucose meter & log to each visit and discussed diet Self management tools provided: Copy of glucometer download provided Other plans: Bmet and urine microalbumin today, RTC in 1 month to assess progress with medication changes

## 2016-05-30 MED FILL — TIVICAY 50 MG TABLET: 50 | 30 days supply | Qty: 30 | Fill #8

## 2016-05-30 MED FILL — DESCOVY 200-25 MG TABS: 200-25 | 30 days supply | Qty: 30 | Fill #8

## 2016-05-31 ENCOUNTER — Telehealth: Payer: Self-pay | Admitting: Pharmacist

## 2016-05-31 MED FILL — CYCLOBENZAPRINE 5 MG TABLET: 5 | 10 days supply | Qty: 30 | Fill #7

## 2016-06-01 NOTE — Progress Notes (Signed)
Internal Medicine Clinic Attending  Case discussed with Dr. Rice soon after the resident saw the patient.  We reviewed the resident's history and exam and pertinent patient test results.  I agree with the assessment, diagnosis, and plan of care documented in the resident's note. 

## 2016-06-02 ENCOUNTER — Other Ambulatory Visit: Payer: Self-pay | Admitting: Infectious Disease

## 2016-06-02 DIAGNOSIS — I10 Essential (primary) hypertension: Secondary | ICD-10-CM

## 2016-06-02 MED FILL — VERAPAMIL ER 240 MG TABLET: 240 | 90 days supply | Qty: 90 | Fill #0

## 2016-06-06 ENCOUNTER — Other Ambulatory Visit: Payer: Self-pay | Admitting: Internal Medicine

## 2016-06-06 ENCOUNTER — Other Ambulatory Visit: Payer: Self-pay | Admitting: Infectious Disease

## 2016-06-06 MED FILL — GABAPENTIN 300 MG CAPSULE: 300 | 30 days supply | Qty: 180 | Fill #1

## 2016-06-06 MED FILL — PANTOPRAZOLE SOD DR 40 MG T: 40 | 60 days supply | Qty: 60 | Fill #0

## 2016-06-06 MED FILL — UNIFINE PENTIPS 8MM 31G: 31G X 8 MM | 25 days supply | Qty: 100 | Fill #0

## 2016-06-13 MED FILL — CYCLOBENZAPRINE 5 MG TABLET: 5 | 10 days supply | Qty: 30 | Fill #8

## 2016-06-20 NOTE — Progress Notes (Signed)
Trying to contact patient for notification/education of liraglutide initiation. Unable to reach x3

## 2016-06-26 MED FILL — CYCLOBENZAPRINE 5 MG TABLET: 5 | 10 days supply | Qty: 30 | Fill #9

## 2016-06-26 MED FILL — TRIAMTERENE/HCTZ 75/50 TAB: 75-50 | 45 days supply | Qty: 45 | Fill #1

## 2016-06-26 MED FILL — SIMETHICONE 180 MG SOFTGEL: 180 | 90 days supply | Qty: 270 | Fill #3

## 2016-06-28 MED FILL — VICTOZA 2-PAK 18 MG/3 ML PE: 18 | 30 days supply | Qty: 6 | Fill #1

## 2016-06-28 MED FILL — DESCOVY 200-25 MG TABS: 200-25 | 30 days supply | Qty: 30 | Fill #9

## 2016-06-28 MED FILL — TIVICAY 50 MG TABLET: 50 | 30 days supply | Qty: 30 | Fill #9

## 2016-06-28 MED FILL — SM ALCOHOL 70% PREP PADS: 70 | 90 days supply | Qty: 400 | Fill #3

## 2016-06-28 MED FILL — UNIFINE PENTIPS 8MM 31G: 31G X 8 MM | 25 days supply | Qty: 100 | Fill #1

## 2016-06-29 ENCOUNTER — Telehealth: Payer: Self-pay | Admitting: Internal Medicine

## 2016-06-29 NOTE — Telephone Encounter (Signed)
PT. REMINDER CALL, LMTCB °

## 2016-06-30 ENCOUNTER — Encounter: Payer: Self-pay | Admitting: Internal Medicine

## 2016-06-30 ENCOUNTER — Ambulatory Visit (INDEPENDENT_AMBULATORY_CARE_PROVIDER_SITE_OTHER): Payer: 59 | Admitting: Internal Medicine

## 2016-06-30 VITALS — BP 131/74 | HR 73 | Temp 98.1°F | Ht 74.0 in | Wt 225.1 lb

## 2016-06-30 DIAGNOSIS — Z794 Long term (current) use of insulin: Secondary | ICD-10-CM | POA: Diagnosis not present

## 2016-06-30 DIAGNOSIS — E1140 Type 2 diabetes mellitus with diabetic neuropathy, unspecified: Secondary | ICD-10-CM

## 2016-06-30 DIAGNOSIS — Z79899 Other long term (current) drug therapy: Secondary | ICD-10-CM | POA: Diagnosis not present

## 2016-06-30 DIAGNOSIS — E782 Mixed hyperlipidemia: Secondary | ICD-10-CM

## 2016-06-30 DIAGNOSIS — E785 Hyperlipidemia, unspecified: Secondary | ICD-10-CM | POA: Diagnosis not present

## 2016-06-30 MED ORDER — ATORVASTATIN CALCIUM 40 MG PO TABS: 40.0000 mg | ORAL_TABLET | Freq: Every day | ORAL | 5 refills | Status: DC

## 2016-06-30 MED FILL — ATORVASTATIN 40 MG TABLET: 40 | 30 days supply | Qty: 30 | Fill #0

## 2016-06-30 NOTE — Progress Notes (Signed)
   CC: Follow up for diabetes management  HPI:  Trevor Fischer is a 55 y.o. man with a medical history detailed below who is seen today in follow up for his diabetes. Since one month ago he has decreased snacking on carbohydrate rich foods, increased daily insulin, and started taking Victoza. He has tolerated this medicine with no adverse effects.   See problem based assessment and plan below for additional details.  Past Medical History:  Diagnosis Date  . Allergic rhinitis   . Degenerative joint disease of knee   . Diabetes mellitus 9/09  . DVT (deep venous thrombosis) ([place])    Ended anticoagulation in 2012  . GERD (gastroesophageal reflux disease)   . 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 HIV infection ([place])   . Hx MRSA infection   . Hyperlipidemia   . Hypertension   . Superficial thrombophlebitis     Review of Systems:  Review of Systems  Constitutional: Negative for fever.  Eyes: Negative for blurred vision.  Respiratory: Negative for shortness of breath.   Cardiovascular: Negative for chest pain and leg swelling.  Gastrointestinal: Negative for diarrhea and nausea.  Genitourinary: Negative for dysuria.  Skin: Negative for rash.  Neurological: Negative for headaches.     Physical Exam:  Vitals:   06/30/16 1506  BP: 131/74  Pulse: 73  Temp: 98.1 F (36.7 C)  TempSrc: Oral  SpO2: 100%  Weight: 225 lb 1.6 oz (102.1 kg)  Height: 6\' 2"  (1.88 m)   GENERAL- alert, co-operative, NAD  CARDIAC- RRR, no murmurs, rubs or gallops. RESP- CTAB, no wheezes or crackles. NEURO- Sensation intact globally EXTREMITIES- Trace R ankle edema present, LLE normal SKIN- Warm, dry, No rash or lesion PSYCH- Normal mood and affect, appropriate thought content and speech   Assessment & Plan:   See Encounters Tab for problem based charting.  Patient discussed with Dr. Daryll Drown

## 2016-06-30 NOTE — Patient Instructions (Signed)
I am so glad to see you feeling well today and making huge improvements towards controlling your health and diabetes!  I would like to see to see you again in December to recheck your A1c and see if this trend continues. Congratulations on making so much progress in such a short time already.  I refilled your atorvastatin today as well.  As always call us sooner if you run into any problems.

## 2016-07-03 NOTE — Assessment & Plan Note (Signed)
A: Trevor Fischer is making excellent progress with his diabetes. On glucometer report he is now within his goal range on >90% of recorded measurements. He has reduced simple sugar intake and started victoza without any complications.  P: Continue lantus 30U daily, aspart 8U Continue Victoza 1.2mg  daily RTC by December for follow up with labs

## 2016-07-03 NOTE — Assessment & Plan Note (Signed)
Refilled atorvastatin 40 mg today.  

## 2016-07-11 MED FILL — ASPIR-LOW EC 81 MG TABLET: 81 | 90 days supply | Qty: 90 | Fill #2

## 2016-07-11 MED FILL — GABAPENTIN 300 MG CAPSULE: 300 | 30 days supply | Qty: 180 | Fill #0

## 2016-07-11 MED FILL — IBUPROFEN 800 MG TABLET: 800 | 30 days supply | Qty: 90 | Fill #0

## 2016-07-11 MED FILL — CYCLOBENZAPRINE 5 MG TABLET: 5 | 10 days supply | Qty: 30 | Fill #10

## 2016-07-11 NOTE — Progress Notes (Signed)
Internal Medicine Clinic Attending  Case discussed with Dr. Rice soon after the resident saw the patient.  We reviewed the resident's history and exam and pertinent patient test results.  I agree with the assessment, diagnosis, and plan of care documented in the resident's note. 

## 2016-07-18 MED FILL — UNIFINE PENTIPS 8MM 31G: 31G X 8 MM | 25 days supply | Qty: 100 | Fill #2

## 2016-07-26 MED FILL — CYCLOBENZAPRINE 5 MG TABLET: 5 | 10 days supply | Qty: 30 | Fill #11

## 2016-07-26 MED FILL — VICTOZA 2-PAK 18 MG/3 ML PE: 18 | 30 days supply | Qty: 6 | Fill #2

## 2016-07-27 MED FILL — TIVICAY 50 MG TABLET: 50 | 30 days supply | Qty: 30 | Fill #10

## 2016-07-27 MED FILL — DESCOVY 200-25 MG TABS: 200-25 | 30 days supply | Qty: 30 | Fill #10

## 2016-07-31 MED FILL — LOSARTAN POTASSIUM 50 MG TA: 50 | 90 days supply | Qty: 90 | Fill #3

## 2016-07-31 MED FILL — PANTOPRAZOLE SOD DR 40 MG T: 40 | 90 days supply | Qty: 90 | Fill #1

## 2016-07-31 MED FILL — ATORVASTATIN 40 MG TABLET: 40 | 90 days supply | Qty: 90 | Fill #1

## 2016-08-04 MED FILL — GABAPENTIN 300 MG CAPSULE: 300 | 30 days supply | Qty: 180 | Fill #1

## 2016-08-08 DIAGNOSIS — E119 Type 2 diabetes mellitus without complications: Secondary | ICD-10-CM | POA: Diagnosis not present

## 2016-08-08 DIAGNOSIS — H524 Presbyopia: Secondary | ICD-10-CM | POA: Diagnosis not present

## 2016-08-08 DIAGNOSIS — H11153 Pinguecula, bilateral: Secondary | ICD-10-CM | POA: Diagnosis not present

## 2016-08-08 DIAGNOSIS — H2513 Age-related nuclear cataract, bilateral: Secondary | ICD-10-CM | POA: Diagnosis not present

## 2016-08-08 DIAGNOSIS — H35411 Lattice degeneration of retina, right eye: Secondary | ICD-10-CM | POA: Diagnosis not present

## 2016-08-08 DIAGNOSIS — H5201 Hypermetropia, right eye: Secondary | ICD-10-CM | POA: Diagnosis not present

## 2016-08-08 DIAGNOSIS — H52223 Regular astigmatism, bilateral: Secondary | ICD-10-CM | POA: Diagnosis not present

## 2016-08-08 LAB — HM DIABETES EYE EXAM

## 2016-08-10 MED FILL — UNIFINE PENTIPS 8MM 31G: 31G X 8 MM | 25 days supply | Qty: 100 | Fill #3

## 2016-08-10 MED FILL — NOVOLOG FLEXPEN SYRINGE: 100 | 58 days supply | Qty: 24 | Fill #3

## 2016-08-14 MED FILL — CYCLOBENZAPRINE 5 MG TABLET: 5 | 10 days supply | Qty: 30 | Fill #12

## 2016-08-15 ENCOUNTER — Telehealth: Payer: Self-pay | Admitting: Internal Medicine

## 2016-08-15 MED ORDER — INSULIN GLARGINE 100 UNIT/ML SOLOSTAR PEN: 23.0000 [IU] | PEN_INJECTOR | Freq: Every day | SUBCUTANEOUS | 3 refills | Status: DC

## 2016-08-15 MED FILL — LANTUS SOLOSTAR 100 UNITS/M: 100 | 90 days supply | Qty: 21 | Fill #0

## 2016-08-15 NOTE — Telephone Encounter (Signed)
Call from pt/pt's husband-states they have been waiting on lantus refill for 2wks.  Refill authorized and sent to pharmacy -he also has appt with pcp on 09/18/16.  Will forward info to pcp for cosign.Despina Hidden Cassady11/14/20173:32 PM

## 2016-08-15 NOTE — Telephone Encounter (Signed)
Needs refill on  Insulin Glargine (LANTUS SOLOSTAR) 100 UNIT/ML Solostar Pen Trevor Fischer [employer]

## 2016-08-16 ENCOUNTER — Encounter: Payer: Self-pay | Admitting: *Deleted

## 2016-08-21 ENCOUNTER — Other Ambulatory Visit: Payer: Self-pay | Admitting: Internal Medicine

## 2016-08-21 DIAGNOSIS — R143 Flatulence: Secondary | ICD-10-CM

## 2016-08-21 MED FILL — VICTOZA 2-PAK 18 MG/3 ML PE: 18 | 30 days supply | Qty: 6 | Fill #3

## 2016-08-21 MED FILL — ACCU-CHEK MULTICLIX LANCETS: 34 days supply | Qty: 204 | Fill #2

## 2016-08-25 ENCOUNTER — Other Ambulatory Visit: Payer: Self-pay | Admitting: Infectious Disease

## 2016-08-28 MED FILL — DESCOVY 200-25 MG TABS: 200-25 | 30 days supply | Qty: 30 | Fill #0

## 2016-08-28 MED FILL — TIVICAY 50 MG TABLET: 50 | 30 days supply | Qty: 30 | Fill #0

## 2016-09-04 ENCOUNTER — Other Ambulatory Visit: Payer: Self-pay | Admitting: Infectious Disease

## 2016-09-04 DIAGNOSIS — I10 Essential (primary) hypertension: Secondary | ICD-10-CM

## 2016-09-04 MED FILL — SM ALCOHOL 70% PREP PADS: 70 | 90 days supply | Qty: 400 | Fill #4

## 2016-09-04 MED FILL — UNIFINE PENTIPS 8MM 31G: 31G X 8 MM | 25 days supply | Qty: 100 | Fill #4

## 2016-09-04 NOTE — Telephone Encounter (Signed)
Is this medication okay to refill for him please advise

## 2016-09-05 MED FILL — VERAPAMIL ER 240 MG TABLET: 240 | 90 days supply | Qty: 90 | Fill #0

## 2016-09-06 ENCOUNTER — Other Ambulatory Visit: Payer: Self-pay | Admitting: *Deleted

## 2016-09-06 MED FILL — CYCLOBENZAPRINE 5 MG TABLET: 5 | 30 days supply | Qty: 30 | Fill #0

## 2016-09-08 MED ORDER — CYCLOBENZAPRINE HCL 5 MG PO TABS: 5.0000 mg | ORAL_TABLET | Freq: Three times a day (TID) | ORAL | 1 refills | Status: DC | PRN

## 2016-09-11 MED FILL — SIMETHICONE 180 MG SOFTGEL: 180 | 90 days supply | Qty: 270 | Fill #0

## 2016-09-13 ENCOUNTER — Other Ambulatory Visit: Payer: Self-pay | Admitting: Sports Medicine

## 2016-09-13 DIAGNOSIS — B029 Zoster without complications: Secondary | ICD-10-CM

## 2016-09-13 NOTE — Telephone Encounter (Signed)
Rx refill request

## 2016-09-14 ENCOUNTER — Telehealth: Payer: Self-pay | Admitting: Internal Medicine

## 2016-09-14 NOTE — Telephone Encounter (Signed)
APT. REMINDER CALL, LMTCB °

## 2016-09-15 ENCOUNTER — Encounter: Payer: Self-pay | Admitting: Internal Medicine

## 2016-09-15 ENCOUNTER — Telehealth: Payer: Self-pay | Admitting: *Deleted

## 2016-09-15 ENCOUNTER — Ambulatory Visit (INDEPENDENT_AMBULATORY_CARE_PROVIDER_SITE_OTHER): Payer: 59 | Admitting: Internal Medicine

## 2016-09-15 VITALS — BP 128/64 | Temp 97.7°F | Ht 74.0 in | Wt 225.8 lb

## 2016-09-15 DIAGNOSIS — G8929 Other chronic pain: Secondary | ICD-10-CM | POA: Diagnosis not present

## 2016-09-15 DIAGNOSIS — E1140 Type 2 diabetes mellitus with diabetic neuropathy, unspecified: Secondary | ICD-10-CM

## 2016-09-15 DIAGNOSIS — Z8781 Personal history of (healed) traumatic fracture: Secondary | ICD-10-CM

## 2016-09-15 DIAGNOSIS — Z794 Long term (current) use of insulin: Secondary | ICD-10-CM

## 2016-09-15 DIAGNOSIS — M5441 Lumbago with sciatica, right side: Secondary | ICD-10-CM | POA: Diagnosis not present

## 2016-09-15 DIAGNOSIS — R143 Flatulence: Secondary | ICD-10-CM

## 2016-09-15 DIAGNOSIS — Z881 Allergy status to other antibiotic agents status: Secondary | ICD-10-CM

## 2016-09-15 DIAGNOSIS — M7989 Other specified soft tissue disorders: Secondary | ICD-10-CM

## 2016-09-15 DIAGNOSIS — I10 Essential (primary) hypertension: Secondary | ICD-10-CM

## 2016-09-15 DIAGNOSIS — Z79899 Other long term (current) drug therapy: Secondary | ICD-10-CM

## 2016-09-15 DIAGNOSIS — Z87891 Personal history of nicotine dependence: Secondary | ICD-10-CM

## 2016-09-15 LAB — GLUCOSE, CAPILLARY: GLUCOSE-CAPILLARY: 80 mg/dL (ref 65–99)

## 2016-09-15 LAB — POCT GLYCOSYLATED HEMOGLOBIN (HGB A1C): Hemoglobin A1C: 7.6

## 2016-09-15 MED ORDER — SIMETHICONE 180 MG PO CAPS: 180.0000 mg | ORAL_CAPSULE | Freq: Three times a day (TID) | ORAL | 3 refills | Status: DC

## 2016-09-15 MED ORDER — CYCLOBENZAPRINE HCL 5 MG PO TABS: 5.0000 mg | ORAL_TABLET | Freq: Three times a day (TID) | ORAL | 3 refills | Status: DC | PRN

## 2016-09-15 MED ORDER — GABAPENTIN 600 MG PO TABS: 600.0000 mg | ORAL_TABLET | Freq: Three times a day (TID) | ORAL | 1 refills | Status: DC

## 2016-09-15 MED FILL — GABAPENTIN 600 MG TABLET: 600 | 90 days supply | Qty: 270 | Fill #0

## 2016-09-15 MED FILL — CYCLOBENZAPRINE 5 MG TABLET: 5 | 30 days supply | Qty: 90 | Fill #0

## 2016-09-15 NOTE — Progress Notes (Signed)
   CC: Follow up for diabetes management  HPI:  [name] is a 55 y.o. with a medical history detailed below who is seen today in follow up for his diabetes. Since He has been on Victoza almost 4 months now without complications. He feels in good condition at this time without any new specific complaints. His diet continues to be somewhat poor with frequent snacking and many sugar rich foods included.  See problem based assessment and plan below for additional details.  Past Medical History:  Diagnosis Date  . Allergic rhinitis   . Degenerative joint disease of knee   . Diabetes mellitus 9/09  . DVT (deep venous thrombosis) ([place])    Ended anticoagulation in 2012  . GERD (gastroesophageal reflux disease)   . 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 HIV infection ([place])   . Hx MRSA infection   . Hyperlipidemia   . Hypertension   . Superficial thrombophlebitis     Review of Systems:  Review of Systems  Constitutional: Negative for weight loss.  Eyes: Negative for blurred vision.  Respiratory: Negative for shortness of breath.   Cardiovascular: Positive for leg swelling.  Gastrointestinal: Negative for diarrhea.  Genitourinary: Negative for frequency.  Skin: Negative for rash.  Neurological: Negative for headaches.  Endo/Heme/Allergies: Negative for polydipsia.  Psychiatric/Behavioral: The patient does not have insomnia.      Physical Exam:  Vitals:   09/15/16 1445  BP: 128/64  Temp: 97.7 F (36.5 C)  TempSrc: Oral  SpO2: 100%  Weight: 225 lb 12.8 oz (102.4 kg)  Height: 6\' 2"  (1.88 m)   GENERAL- alert, co-operative, NAD  CARDIAC- RRR, no murmurs, rubs or gallops. RESP- CTAB, no wheezes or crackles. NEURO- Sensation intact globally, 5/5 strength in bilateral lower extremities with normal ROM EXTREMITIES- Small RLE edema present, LLE normal SKIN- Warm, dry, No rash or lesion PSYCH- Normal mood and affect, appropriate thought content and  speech  Assessment & Plan:   See Encounters Tab for problem based charting.  Patient discussed with Dr. Daryll Drown

## 2016-09-15 NOTE — Telephone Encounter (Signed)
Called patient & stated pharmacy just received rx for gabapentin.

## 2016-09-15 NOTE — Telephone Encounter (Signed)
Patient @ pharmacy awaiting for gabapentin to be filled. Informed Dr. Benjamine Mola.

## 2016-09-15 NOTE — Patient Instructions (Signed)
It was a pleasure to see you today. Your diabetes control appears to be improving significantly from our last visit. I do not recommend any specific medication changes today, but I will be adjusting several of your prescriptions to 3 months supplies or consolidating pills. I will review medicines and call you next week probably Friday about new recommendations.  I will plan to see you again in March or sooner if needed.

## 2016-09-18 ENCOUNTER — Telehealth (INDEPENDENT_AMBULATORY_CARE_PROVIDER_SITE_OTHER): Payer: Self-pay

## 2016-09-18 DIAGNOSIS — E1140 Type 2 diabetes mellitus with diabetic neuropathy, unspecified: Secondary | ICD-10-CM

## 2016-09-18 DIAGNOSIS — Z794 Long term (current) use of insulin: Principal | ICD-10-CM

## 2016-09-18 MED ORDER — GABAPENTIN 600 MG PO TABS: ORAL_TABLET | ORAL | 1 refills | Status: DC

## 2016-09-18 NOTE — Telephone Encounter (Signed)
Patient pharmacy requiring a Rx refill for gabapentin. Thank You

## 2016-09-18 NOTE — Telephone Encounter (Signed)
Called to clarify with the pharmacy that I am unable to fill this for him at this time since it is been greater than 1 year since he was seen.  I have requested that he either have an appointment scheduled with myself or to follow-up with his primary care physician regarding having this prescription filled.  I am transferring clinics at the end of this week and he is unable to be seen by them will need to wait until I establish at my new office.

## 2016-09-18 NOTE — Telephone Encounter (Signed)
Noted. Thank You.

## 2016-09-21 ENCOUNTER — Other Ambulatory Visit: Payer: Self-pay | Admitting: Internal Medicine

## 2016-09-21 ENCOUNTER — Other Ambulatory Visit: Payer: Self-pay | Admitting: Infectious Disease

## 2016-09-21 DIAGNOSIS — I10 Essential (primary) hypertension: Secondary | ICD-10-CM

## 2016-09-21 DIAGNOSIS — Z794 Long term (current) use of insulin: Principal | ICD-10-CM

## 2016-09-21 DIAGNOSIS — E1140 Type 2 diabetes mellitus with diabetic neuropathy, unspecified: Secondary | ICD-10-CM

## 2016-09-21 MED ORDER — INSULIN GLARGINE 100 UNIT/ML SOLOSTAR PEN: 30.0000 [IU] | PEN_INJECTOR | Freq: Every day | SUBCUTANEOUS | 3 refills | Status: DC

## 2016-09-21 MED ORDER — ATORVASTATIN CALCIUM 40 MG PO TABS: 40.0000 mg | ORAL_TABLET | Freq: Every day | ORAL | 3 refills | Status: DC

## 2016-09-21 MED ORDER — INSULIN PEN NEEDLE 31G X 8 MM MISC: 11 refills | Status: DC

## 2016-09-21 MED ORDER — INSULIN ASPART 100 UNIT/ML FLEXPEN: 8.0000 [IU] | PEN_INJECTOR | Freq: Three times a day (TID) | SUBCUTANEOUS | 5 refills | Status: DC

## 2016-09-21 MED FILL — TRIAMTERENE/HCTZ 75/50 TAB: 75-50 | 90 days supply | Qty: 90 | Fill #0

## 2016-09-21 MED FILL — UNIFINE PENTIPS 8MM 31G: 31G X 8 MM | 40 days supply | Qty: 200 | Fill #0

## 2016-09-21 NOTE — Assessment & Plan Note (Signed)
Refilled simethicone for ongoing complaint of flatulence without complications.

## 2016-09-21 NOTE — Assessment & Plan Note (Signed)
BP Readings from Last 3 Encounters:  09/15/16 128/64  06/30/16 131/74  05/26/16 138/83    Lab Results  Component Value Date   NA 139 05/26/2016   K 4.3 05/26/2016   CREATININE 0.97 05/26/2016    Assessment: Blood pressure control: Controlled Progress toward BP goal:  Remains at goal Comments: Patient is well-controlled with a stable medication regimen including triamterene-HCTZ 75-50 mg, verapamil 240 mg, losartan 50 mg  Plan: Medications:  continue current medications Other plans: Changed some medications to 90 day supply prescriptions since he remains stable and well controlled at this time

## 2016-09-21 NOTE — Assessment & Plan Note (Signed)
Lab Results  Component Value Date   HGBA1C 7.6 09/15/2016   HGBA1C 8.5 05/26/2016   HGBA1C 8.1 02/18/2016     Assessment: Diabetes control: Mildly uncontrolled Progress toward A1C goal:  Improving Comments: Trevor Fischer is very compliant and continuing to take his insulin lantus and aspart plus victoza 1.2mg  daily. He has been less active in the cold weather and reports eating lots of sweets still which he thinks is contributing to some weight gain. Given his progress compared to 3 months ago and lack of symptoms or complications I think we can keep the current treatment with a reasonable expectation of further improvement.   Plan: Medications:  continue current medications - Liraglutide 1.2mg , lantus 30U, aspart 8U TIDAC Home glucose monitoring: Frequency: 2-3 times daily Instruction/counseling given: reminded to get eye exam and discussed diet Self management tools provided:  Copy of glucometer log provided Other plans:  Refilled medication prescription, changed needle Rx for victoza + insulin injections. We will need to check metabolic panel at next visit if not checked by [facility] in the interval. Needs eye exam. Needs full foot exam at next visit.

## 2016-09-21 NOTE — Assessment & Plan Note (Addendum)
He continues to have fairly frequent back and right leg pain for which he now takes flexeril 5mg  with improvement. This is an increase in medication requirement for symptoms compared to early this year. He is not having any noticeable side effects from this such as drowsiness. He has chronic mild swelling in the right ankle that I suspect is more related to his fibula fracture last year because it is persistent ever since he got out of the boot. 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  I will change his prescription today to reflect generally TID use rather than intermittent Continue gabapentin 600mg  TID Recommended to continue avoid chronic NSAIDs I do not see a need for further workup at this time unless symptoms are uncontrolled or impairing activity

## 2016-09-22 MED FILL — ACCU-CHEK AVIVA PLUS TEST S: 90 days supply | Qty: 400 | Fill #3

## 2016-09-22 MED FILL — VICTOZA 2-PAK 18 MG/3 ML PE: 18 | 90 days supply | Qty: 18 | Fill #0

## 2016-09-22 NOTE — Progress Notes (Signed)
Internal Medicine Clinic Attending  Case discussed with Dr. Rice soon after the resident saw the patient.  We reviewed the resident's history and exam and pertinent patient test results.  I agree with the assessment, diagnosis, and plan of care documented in the resident's note. 

## 2016-10-05 MED FILL — TIVICAY 50 MG TABLET: 50 | 30 days supply | Qty: 30 | Fill #1

## 2016-10-05 MED FILL — DESCOVY 200-25 MG TABS: 200-25 | 30 days supply | Qty: 30 | Fill #1

## 2016-10-09 ENCOUNTER — Ambulatory Visit: Payer: Self-pay | Admitting: Pharmacist

## 2016-10-09 MED FILL — ASPIR-LOW EC 81 MG TABLET: 81 | 90 days supply | Qty: 90 | Fill #3

## 2016-10-11 ENCOUNTER — Ambulatory Visit (INDEPENDENT_AMBULATORY_CARE_PROVIDER_SITE_OTHER): Payer: 59 | Admitting: Internal Medicine

## 2016-10-11 VITALS — BP 149/85 | HR 88 | Temp 98.3°F | Ht 74.0 in | Wt 224.0 lb

## 2016-10-11 DIAGNOSIS — Z9889 Other specified postprocedural states: Secondary | ICD-10-CM | POA: Diagnosis not present

## 2016-10-11 DIAGNOSIS — L02211 Cutaneous abscess of abdominal wall: Secondary | ICD-10-CM | POA: Diagnosis not present

## 2016-10-11 DIAGNOSIS — L02219 Cutaneous abscess of trunk, unspecified: Secondary | ICD-10-CM

## 2016-10-11 DIAGNOSIS — L03319 Cellulitis of trunk, unspecified: Principal | ICD-10-CM

## 2016-10-11 DIAGNOSIS — N529 Male erectile dysfunction, unspecified: Secondary | ICD-10-CM

## 2016-10-11 DIAGNOSIS — Z21 Asymptomatic human immunodeficiency virus [HIV] infection status: Secondary | ICD-10-CM

## 2016-10-11 DIAGNOSIS — L03311 Cellulitis of abdominal wall: Secondary | ICD-10-CM | POA: Diagnosis not present

## 2016-10-11 DIAGNOSIS — B9689 Other specified bacterial agents as the cause of diseases classified elsewhere: Secondary | ICD-10-CM | POA: Diagnosis not present

## 2016-10-11 DIAGNOSIS — Z872 Personal history of diseases of the skin and subcutaneous tissue: Secondary | ICD-10-CM

## 2016-10-11 DIAGNOSIS — Z8614 Personal history of Methicillin resistant Staphylococcus aureus infection: Secondary | ICD-10-CM

## 2016-10-11 MED ORDER — TADALAFIL 10 MG PO TABS
10.0000 mg | ORAL_TABLET | Freq: Every day | ORAL | 0 refills | Status: DC | PRN
Start: 2016-10-11 — End: 2016-10-17

## 2016-10-11 MED ORDER — SULFAMETHOXAZOLE-TRIMETHOPRIM 800-160 MG PO TABS: 1.0000 | ORAL_TABLET | Freq: Two times a day (BID) | ORAL | 0 refills | Status: AC

## 2016-10-11 MED FILL — SULFAMETHOXAZOLE/TMP DS TAB: 800-160 | 5 days supply | Qty: 10 | Fill #0

## 2016-10-11 NOTE — Progress Notes (Signed)
   CC: Left abdomen cellulitis  HPI:  [name] is a 56 y.o. man with a medical history of T2DM, HIV, hypertension, degenerative arthritis here today for a skin infection on the left side of his stomach. He first noticed a <1cm diameter dark spot that was not warm or tender actually it was present during his last clinic visit in December. Since that time the spot has increased in size to about 2cm diameter and now with a surrounding erythema. It is not particularly painful and he denies any systemic complaints. He thinks the spot is near one of the areas he uses for his in insulin or Victoza injections. 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  See problem based assessment and plan below for additional details  Past Medical History:  Diagnosis Date  . Allergic rhinitis   . Degenerative joint disease of knee   . Diabetes mellitus 9/09  . DVT (deep venous thrombosis) ([place])    Ended anticoagulation in 2012  . GERD (gastroesophageal reflux disease)   . 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 HIV infection ([place])   . Hx MRSA infection   . Hyperlipidemia   . Hypertension   . Superficial thrombophlebitis     Review of Systems:  Review of Systems  Constitutional: Negative for chills and fever.  Gastrointestinal: Negative for abdominal pain and nausea.  Skin: Negative for rash.  Endo/Heme/Allergies: Does not bruise/bleed easily.    Physical Exam: Physical Exam  Constitutional: He is well-developed, well-nourished, and in no distress. No distress.  Cardiovascular: Normal rate and regular rhythm.   Pulmonary/Chest: Effort normal and breath sounds normal.  Abdominal:  Approximately 2cm diameter raised area with another 1-2cm border of surrounding erythema and induration, minimally tender to touch  Skin: Skin is warm and dry.  Psychiatric: Affect normal.   Point of care  ultrasound used to observed organized subcutaneous fluid collection.  Vitals:   10/11/16 1552  BP: (!) 149/85  Pulse: 88  Temp: 98.3 F (36.8 C)  TempSrc: Oral  SpO2: 98%  Weight: 224 lb (101.6 kg)  Height: 6\' 2"  (1.88 m)    Assessment & Plan:   See Encounters Tab for problem based charting.  Patient seen with Dr. Angelia Mould

## 2016-10-11 NOTE — Patient Instructions (Signed)
It was a pleasure to see you today [name].  We performed an incision and drainage of a small skin abscess today. This should usually cure the related process but to be sure I prescribed a 5 day course of Bactrim. Take this twice daily for 5 days to clear up any remaining infection. If the area gets worse after treating it please call back.  I also sent a prescription for 10mg  tadalafil (Cialis). You can try using this 30-180 minutes before sexual activity and see if it gives any improvement. The most common side effect would be lightheadedness or shortness of breath so discontinue this if you notice these problems. If you get no improvement we can try changing the dose in a follow up visit.

## 2016-10-12 NOTE — Progress Notes (Signed)
Incision and Drainage Procedure Note  Pre-operative Diagnosis: left lower abdomen abscess  Indications: Treatment of organized fluid collection  Anesthesia: Lidocaine 1% without epinephrine  Procedure Details   A small skin abscess was identified by physical exam and confirmed using point of care ultrasound. Consent for the procedure was obtained. The area was prepped using iodine. Local anaesthesia used circumferential injection of 2.5 mL of 1% lidocaine with a 25 gauge needle. A number 11 blade was used to make a 1.25cm horizontal incision with blood and purulent fluid expressed from the abscess. Hemostasis was achieved with direct pressure and a simple adhesive bandage.  Dr. Joni Reining was present in a supervising role throughout the procedure.   Complications:  None; patient tolerated the procedure well.

## 2016-10-13 NOTE — Assessment & Plan Note (Signed)
A: Trevor Fischer had a small skin infection probably from his multiple daily injectable therapies. There was about a 2cm organized abscess in the center of this so it was drained with purulent contents noted. Because of his history of diabetes and HIV I also want to treat with a short course of oral antibiotics with staph coverage.  P: - I&D today - Bactrim DS BID x5 days

## 2016-10-13 NOTE — Assessment & Plan Note (Signed)
We have previously discussed this topic last month and after reviewing history and medications I don't see a contraindication to PDE5 therapy. He reports having urges and partial arousal but unable to maintain an adequate erection for sexual activity. He has multiple underlying causes for this including age, hypertension, and diabetes and is on numerous medications for these. Prescribed 10 pills of tadalafil 50mg  he can try for benefit or adverse effects and follow up at next visit with me.

## 2016-10-17 ENCOUNTER — Telehealth: Payer: Self-pay | Admitting: *Deleted

## 2016-10-17 MED ORDER — SILDENAFIL CITRATE 50 MG PO TABS: 50.0000 mg | ORAL_TABLET | Freq: Every day | ORAL | 0 refills | Status: DC | PRN

## 2016-10-17 NOTE — Telephone Encounter (Signed)
Received faxed from pt's pharmacy regarding pt's cialis rx stating "pt wants to change to generic viagra b/c this is very expensive, please send in new rx"  Will send info to pcp for review and medication change if appropriate.Despina Hidden Cassady1/16/20184:52 PM

## 2016-10-17 NOTE — Progress Notes (Signed)
Internal Medicine Clinic Attending  I saw and evaluated the patient.  I personally confirmed the key portions of the history and exam documented by Dr. Benjamine Mola and I reviewed pertinent patient test results.  The assessment, diagnosis, and plan were formulated together and I agree with the documentation in the resident's note. I was present for the entire procedure.

## 2016-10-17 NOTE — Telephone Encounter (Signed)
Prescription changed. Generic sildenafil should be fine as an alternative therapy.

## 2016-10-18 MED FILL — SILDENAFIL 50 MG TABLET: 50 | 30 days supply | Qty: 6 | Fill #0

## 2016-10-24 ENCOUNTER — Other Ambulatory Visit: Payer: Self-pay | Admitting: Internal Medicine

## 2016-10-24 DIAGNOSIS — Z794 Long term (current) use of insulin: Principal | ICD-10-CM

## 2016-10-24 DIAGNOSIS — E1140 Type 2 diabetes mellitus with diabetic neuropathy, unspecified: Secondary | ICD-10-CM

## 2016-10-24 MED ORDER — INSULIN ASPART 100 UNIT/ML FLEXPEN: 8.0000 [IU] | PEN_INJECTOR | Freq: Three times a day (TID) | SUBCUTANEOUS | 5 refills | Status: DC

## 2016-10-24 MED ORDER — INSULIN LISPRO 100 UNIT/ML (KWIKPEN): 8.0000 [IU] | PEN_INJECTOR | Freq: Three times a day (TID) | SUBCUTANEOUS | 5 refills | Status: DC

## 2016-10-24 MED FILL — HUMALOG 100 UNITS/ML KWIKPE: 100 | 63 days supply | Qty: 15 | Fill #0

## 2016-10-27 MED FILL — ATORVASTATIN 40 MG TABLET: 40 | 60 days supply | Qty: 60 | Fill #2

## 2016-11-02 MED FILL — TIVICAY 50 MG TABLET: 50 | 30 days supply | Qty: 30 | Fill #2

## 2016-11-02 MED FILL — DESCOVY 200-25 MG TABS: 200-25 | 30 days supply | Qty: 30 | Fill #2

## 2016-11-06 MED FILL — LOSARTAN POTASSIUM 50 MG TA: 50 | 90 days supply | Qty: 90 | Fill #4

## 2016-11-06 MED FILL — CYCLOBENZAPRINE 5 MG TABLET: 5 | 30 days supply | Qty: 90 | Fill #1

## 2016-11-06 MED FILL — LANTUS SOLOSTAR 100 UNITS/M: 100 | 90 days supply | Qty: 21 | Fill #1

## 2016-11-06 MED FILL — UNIFINE PENTIPS 8MM 31G: 31G X 8 MM | 40 days supply | Qty: 200 | Fill #1

## 2016-11-07 MED FILL — PANTOPRAZOLE SOD DR 40 MG T: 40 | 90 days supply | Qty: 90 | Fill #2

## 2016-11-09 ENCOUNTER — Other Ambulatory Visit: Payer: Self-pay | Admitting: *Deleted

## 2016-11-09 MED FILL — ACCU-CHEK FASTCLIX LANCETS: 75 days supply | Qty: 306 | Fill #0

## 2016-11-09 MED FILL — ACCU-CHEK GUIDE TEST STRIP: 25 days supply | Qty: 100 | Fill #0

## 2016-11-10 MED ORDER — GLUCOSE BLOOD VI STRP: ORAL_STRIP | 12 refills | Status: DC

## 2016-11-10 MED ORDER — ACCU-CHEK FASTCLIX LANCETS MISC: 11 refills | Status: DC

## 2016-11-21 ENCOUNTER — Encounter: Payer: Self-pay | Admitting: *Deleted

## 2016-12-01 MED FILL — TIVICAY 50 MG TABLET: 50 | 30 days supply | Qty: 30 | Fill #3

## 2016-12-01 MED FILL — DESCOVY 200-25 MG TABS: 200-25 | 30 days supply | Qty: 30 | Fill #3

## 2016-12-04 ENCOUNTER — Telehealth: Payer: Self-pay | Admitting: *Deleted

## 2016-12-04 MED FILL — VERAPAMIL ER 240 MG TABLET: 240 | 90 days supply | Qty: 90 | Fill #1

## 2016-12-05 MED FILL — SM ALCOHOL 70% PREP PADS: 70 | 90 days supply | Qty: 400 | Fill #0

## 2016-12-06 ENCOUNTER — Telehealth: Payer: Self-pay | Admitting: *Deleted

## 2016-12-06 DIAGNOSIS — Z794 Long term (current) use of insulin: Principal | ICD-10-CM

## 2016-12-06 DIAGNOSIS — E1140 Type 2 diabetes mellitus with diabetic neuropathy, unspecified: Secondary | ICD-10-CM

## 2016-12-06 NOTE — Telephone Encounter (Signed)
Fax from [facility]:  Needs refill on Sm Alcohol 70% Prep pads Use 4 times daily as needed. Last refill was 09/04/16 qty 400. Thanks

## 2016-12-07 ENCOUNTER — Telehealth: Payer: Self-pay | Admitting: Internal Medicine

## 2016-12-07 DIAGNOSIS — Z794 Long term (current) use of insulin: Principal | ICD-10-CM

## 2016-12-07 DIAGNOSIS — E1140 Type 2 diabetes mellitus with diabetic neuropathy, unspecified: Secondary | ICD-10-CM

## 2016-12-07 MED ORDER — INSULIN LISPRO 100 UNIT/ML (KWIKPEN): 10.0000 [IU] | PEN_INJECTOR | Freq: Four times a day (QID) | SUBCUTANEOUS | 5 refills | Status: DC

## 2016-12-07 MED ORDER — SM ALCOHOL PREP 70 % PADS: 1.0000 | MEDICATED_PAD | Freq: Four times a day (QID) | 2 refills | Status: DC | PRN

## 2016-12-07 MED FILL — HUMALOG 100 UNITS/ML KWIKPE: 100 | 38 days supply | Qty: 15 | Fill #0

## 2016-12-07 NOTE — Telephone Encounter (Signed)
I spoke w/ the [facility] case manager for pt and she states they do not ever change a pt's medications and she did not change pt's humalog dose, she states she does not know what happened, pt is linked to [facility] app and she can see his readings from meter. She cannot tell where the information originated but states the info was confirmed with pt when he came into the program. She states he has had a few lows but nothing serious or consistent

## 2016-12-07 NOTE — Telephone Encounter (Signed)
Spoke w/ pt he states the wellness program here at [facility] changed his humalog appr 2 months ago to 10units humalog 4x daily 2am 10units 7-8am 10units 2-3pm 10units 7pm 10 units Op pharm is requesting a new script for this especially since insurance is refusing to fill based on present script in med[facility]. Could you please send a new script?

## 2016-12-07 NOTE — Telephone Encounter (Signed)
Pharmacy calling about a med for patient please call

## 2016-12-07 NOTE — Telephone Encounter (Signed)
Asking to speak with Trevor Fischer.

## 2016-12-07 NOTE — Telephone Encounter (Signed)
Tried to call pt to discuss use of 10u humalog, cannot find the documentation for going from 8u 3x daily to 10u 3x daily, lm at pt's mobile and home

## 2016-12-12 MED FILL — ACCU-CHEK GUIDE TEST STRIP: 25 days supply | Qty: 100 | Fill #1

## 2016-12-12 MED FILL — UNIFINE PENTIPS 8MM 31G: 31G X 8 MM | 40 days supply | Qty: 200 | Fill #2

## 2016-12-12 MED FILL — GABAPENTIN 600 MG TABLET: 600 | 90 days supply | Qty: 270 | Fill #1

## 2016-12-20 MED FILL — CYCLOBENZAPRINE 5 MG TABLET: 5 | 30 days supply | Qty: 90 | Fill #2

## 2016-12-20 MED FILL — VICTOZA 2-PAK 18 MG/3 ML PE: 18 | 90 days supply | Qty: 18 | Fill #1

## 2016-12-29 ENCOUNTER — Encounter: Payer: 59 | Admitting: Internal Medicine

## 2017-01-01 MED FILL — DESCOVY 200-25 MG TABS: 200-25 | 30 days supply | Qty: 30 | Fill #4

## 2017-01-01 MED FILL — TIVICAY 50 MG TABLET: 50 | 30 days supply | Qty: 30 | Fill #4

## 2017-01-03 MED FILL — ATORVASTATIN 40 MG TABLET: 40 | 90 days supply | Qty: 90 | Fill #0

## 2017-01-09 ENCOUNTER — Other Ambulatory Visit: Payer: Self-pay | Admitting: Internal Medicine

## 2017-01-09 MED FILL — ASPIR-LOW 81 MG TABLET EC: 81 | 90 days supply | Qty: 90 | Fill #0

## 2017-01-16 MED FILL — UNIFINE PENTIPS 8MM 31G: 31G X 8 MM | 40 days supply | Qty: 200 | Fill #3

## 2017-01-16 MED FILL — ACCU-CHEK GUIDE TEST STRIP: 25 days supply | Qty: 100 | Fill #2

## 2017-01-16 MED FILL — HUMALOG 100 UNITS/ML KWIKPE: 100 | 38 days supply | Qty: 15 | Fill #1

## 2017-01-30 ENCOUNTER — Other Ambulatory Visit: Payer: Self-pay | Admitting: Infectious Disease

## 2017-01-30 ENCOUNTER — Other Ambulatory Visit: Payer: Self-pay | Admitting: Internal Medicine

## 2017-01-30 MED FILL — PANTOPRAZOLE SOD DR 40 MG T: 40 | 90 days supply | Qty: 90 | Fill #0

## 2017-01-30 MED FILL — CYCLOBENZAPRINE 5 MG TABLET: 5 | 30 days supply | Qty: 90 | Fill #3

## 2017-01-31 MED FILL — LOSARTAN POTASSIUM 50 MG TA: 50 | 90 days supply | Qty: 90 | Fill #0

## 2017-02-01 MED FILL — LANTUS SOLOSTAR 100 UNITS/M: 100 | 90 days supply | Qty: 21 | Fill #2

## 2017-02-02 MED FILL — TIVICAY 50 MG TABLET: 50 | 30 days supply | Qty: 30 | Fill #5

## 2017-02-02 MED FILL — DESCOVY 200-25 MG TABS: 200-25 | 30 days supply | Qty: 30 | Fill #5

## 2017-02-06 ENCOUNTER — Other Ambulatory Visit: Payer: Self-pay

## 2017-02-06 DIAGNOSIS — Z794 Long term (current) use of insulin: Principal | ICD-10-CM

## 2017-02-06 DIAGNOSIS — E1140 Type 2 diabetes mellitus with diabetic neuropathy, unspecified: Secondary | ICD-10-CM

## 2017-02-06 MED ORDER — SILDENAFIL CITRATE 50 MG PO TABS: 50.0000 mg | ORAL_TABLET | Freq: Every day | ORAL | 2 refills | Status: DC | PRN

## 2017-02-06 MED ORDER — INSULIN PEN NEEDLE 31G X 8 MM MISC: 11 refills | Status: DC

## 2017-02-06 MED FILL — IBUPROFEN 800 MG TAB: 800 | 30 days supply | Qty: 90 | Fill #1

## 2017-02-07 MED FILL — SILDENAFIL 50 MG TABLET: 50 | 30 days supply | Qty: 6 | Fill #0

## 2017-02-09 ENCOUNTER — Ambulatory Visit (INDEPENDENT_AMBULATORY_CARE_PROVIDER_SITE_OTHER): Payer: 59 | Admitting: Internal Medicine

## 2017-02-09 ENCOUNTER — Encounter: Payer: Self-pay | Admitting: Internal Medicine

## 2017-02-09 VITALS — BP 140/78 | HR 90 | Temp 98.0°F | Ht 74.0 in | Wt 231.8 lb

## 2017-02-09 DIAGNOSIS — I10 Essential (primary) hypertension: Secondary | ICD-10-CM | POA: Diagnosis not present

## 2017-02-09 DIAGNOSIS — Z87891 Personal history of nicotine dependence: Secondary | ICD-10-CM | POA: Diagnosis not present

## 2017-02-09 DIAGNOSIS — Z79899 Other long term (current) drug therapy: Secondary | ICD-10-CM | POA: Diagnosis not present

## 2017-02-09 DIAGNOSIS — Z794 Long term (current) use of insulin: Secondary | ICD-10-CM

## 2017-02-09 DIAGNOSIS — E1140 Type 2 diabetes mellitus with diabetic neuropathy, unspecified: Secondary | ICD-10-CM | POA: Diagnosis not present

## 2017-02-09 DIAGNOSIS — Z760 Encounter for issue of repeat prescription: Secondary | ICD-10-CM | POA: Diagnosis not present

## 2017-02-09 DIAGNOSIS — N529 Male erectile dysfunction, unspecified: Secondary | ICD-10-CM | POA: Diagnosis not present

## 2017-02-09 DIAGNOSIS — M79604 Pain in right leg: Secondary | ICD-10-CM | POA: Diagnosis not present

## 2017-02-09 DIAGNOSIS — G8929 Other chronic pain: Secondary | ICD-10-CM

## 2017-02-09 DIAGNOSIS — M545 Low back pain: Secondary | ICD-10-CM

## 2017-02-09 DIAGNOSIS — M5441 Lumbago with sciatica, right side: Secondary | ICD-10-CM

## 2017-02-09 LAB — GLUCOSE, CAPILLARY: GLUCOSE-CAPILLARY: 107 mg/dL — AB (ref 65–99)

## 2017-02-09 LAB — POCT GLYCOSYLATED HEMOGLOBIN (HGB A1C): Hemoglobin A1C: 7.2

## 2017-02-09 MED ORDER — CYCLOBENZAPRINE HCL 10 MG PO TABS: 10.0000 mg | ORAL_TABLET | Freq: Three times a day (TID) | ORAL | 2 refills | Status: DC | PRN

## 2017-02-09 MED ORDER — LIRAGLUTIDE 18 MG/3ML ~~LOC~~ SOPN: 1.8000 mg | PEN_INJECTOR | Freq: Every day | SUBCUTANEOUS | 8 refills | Status: DC

## 2017-02-09 MED ORDER — INSULIN PEN NEEDLE 31G X 8 MM MISC: 11 refills | Status: DC

## 2017-02-09 NOTE — Patient Instructions (Signed)
It was a pleasure to see you today [name].  Your hemoglobin A1c was 7.2% today which is improving but not quite at goal. I will call you with any changes to your diabetes medications. The new prescription for pens should be updated.  For your flexeril you can try doubling the dose to 10mg  at a time. If this causes too much drowsiness we may need to try changing to a different medication.  We are checking your blood tests today for kidney function and blood counts.

## 2017-02-09 NOTE — Progress Notes (Signed)
   CC: Medication refills  HPI:  [name] is a 56 y.o. man here today for diabetes management and medication refills.   See problem based assessment and plan below for additional details  Past Medical History:  Diagnosis Date  . Allergic rhinitis   . Degenerative joint disease of knee   . Diabetes mellitus 9/09  . DVT (deep venous thrombosis) ([place])    Ended anticoagulation in 2012  . GERD (gastroesophageal reflux disease)   . 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 HIV infection ([place])   . Hx MRSA infection   . Hyperlipidemia   . Hypertension   . Superficial thrombophlebitis     Review of Systems:  Review of Systems  Respiratory: Negative for shortness of breath.   Cardiovascular: Positive for leg swelling. Negative for chest pain.  Gastrointestinal: Negative for constipation and diarrhea.  Genitourinary: Negative for frequency.  Musculoskeletal: Positive for myalgias.  Skin: Negative for rash.  Neurological: Negative for headaches.  Endo/Heme/Allergies: Negative for polydipsia.    Physical Exam: Physical Exam  Constitutional: He is well-developed, well-nourished, and in no distress.  Cardiovascular: Normal rate and regular rhythm.   Pulmonary/Chest: Effort normal and breath sounds normal.  Abdominal: Soft. He exhibits no distension. There is no tenderness.  Musculoskeletal: Normal range of motion.  1+ pitting edema in right ankle  Skin: Skin is warm and dry.  Psychiatric: Affect normal.    Vitals:   02/09/17 1347  BP: 140/78  Pulse: 90  Temp: 98 F (36.7 C)  TempSrc: Oral  SpO2: 97%  Weight: 231 lb 12.8 oz (105.1 kg)  Height: 6\' 2"  (1.88 m)    Assessment & Plan:   See Encounters Tab for problem based charting.  Patient discussed with Dr. Angelia Mould

## 2017-02-10 LAB — CBC
HEMATOCRIT: 42 % (ref 37.5–51.0)
Hemoglobin: 13.9 g/dL (ref 13.0–17.7)
MCH: 26.7 pg (ref 26.6–33.0)
MCHC: 33.1 g/dL (ref 31.5–35.7)
MCV: 81 fL (ref 79–97)
PLATELETS: 250 10*3/uL (ref 150–379)
RBC: 5.21 x10E6/uL (ref 4.14–5.80)
RDW: 14.2 % (ref 12.3–15.4)
WBC: 7.8 10*3/uL (ref 3.4–10.8)

## 2017-02-10 LAB — BMP8+ANION GAP
Anion Gap: 16 mmol/L (ref 10.0–18.0)
BUN / CREAT RATIO: 14 (ref 9–20)
BUN: 17 mg/dL (ref 6–24)
CHLORIDE: 99 mmol/L (ref 96–106)
CO2: 27 mmol/L (ref 18–29)
Calcium: 9.6 mg/dL (ref 8.7–10.2)
Creatinine, Ser: 1.22 mg/dL (ref 0.76–1.27)
GFR calc non Af Amer: 65 mL/min/{1.73_m2} (ref >59)
GFR, EST AFRICAN AMERICAN: 76 mL/min/{1.73_m2} (ref >59)
GLUCOSE: 51 mg/dL — AB (ref 65–99)
Potassium: 4 mmol/L (ref 3.5–5.2)
SODIUM: 142 mmol/L (ref 134–144)

## 2017-02-11 MED FILL — UNIFINE PENTIPS 8MM 31G: 31G X 8 MM | 33 days supply | Qty: 200 | Fill #0

## 2017-02-12 MED FILL — ACCU-CHEK GUIDE TEST STRIP: 25 days supply | Qty: 100 | Fill #3

## 2017-02-12 NOTE — Assessment & Plan Note (Signed)
Lab Results  Component Value Date   HGBA1C 7.2 02/09/2017   HGBA1C 7.6 09/15/2016   HGBA1C 8.5 05/26/2016     He denies any issues with headache, fatigue, polydipsia, or urinary frequency. He has had zero episodes of hypoglycemia since our last visit.  Diabetic foot exam today reveals intact sensation throughout with very minor callus formation on the medial right foot. Protective sensation is intact and good pulses are present.  Assessment: Diabetes control: Mildly uncontrolled Progress toward A1C goal:  Almost at goal of <7.0% Comments: He brought his glucometer today which actually demonstrated 90% of readings within target range. Diabetes control has been steadily improving since last year and the addition of victoza to his medications. Overall this has trended in the right direction again without new medical intervention and it seems reasonable to continue with this treatment plan, or increasing liraglutide to maximum dosing since this medication has been so effective for him.  Plan: Medications:  continue current medications - Liraglutide 1.2mg , lantus 30U, aspart 8U TIDAC Home glucose monitoring: Frequency: 2-3 times daily Timing: Before meals Self management tools provided: copy of home glucose meter download Other plans: He has been improving dramatically with liraglutideadded to the home regimen.  I will adjust orders to reflect 5 times daily injections  Checking renal function today  If an available option through pharmacy will order dose adjustment to 1.8mg  daily liraglutide

## 2017-02-12 NOTE — Assessment & Plan Note (Signed)
He continues to have intermittent but frequent lower back and right leg pain that is partially improved with 5mg  flexeril PRN. He denies any numbness, weakness, or falls associated with this. Lately he has noticed this more and is interested in possibly changing his medication.  I recommended he could try self increasing the flexeril dose to 10mg  TID PRN I cautioned him to monitor for drowsiness especially trying a higher dose initially 3 months prescription ordered and will follow up progress at next clinic visit

## 2017-02-12 NOTE — Assessment & Plan Note (Signed)
BP Readings from Last 3 Encounters:  02/09/17 140/78  10/11/16 (!) 149/85  09/15/16 128/64    Lab Results  Component Value Date   NA 142 02/09/2017   K 4.0 02/09/2017   CREATININE 1.22 02/09/2017    Assessment: Blood pressure control: Well cnotrolled Progress toward BP goal:  At goal  Comments: He continues to do well on triamterene-HCTZ 75-50 mg, verapamil 240 mg, losartan 50 mg. I believe his right pedal edema is secondary to chronic lymphatic or venous insufficiency after previous fracture at that site, as it is not symmetric or inflammatory.  Plan: Medications:  continue current medications Bmet checked today as above

## 2017-02-12 NOTE — Assessment & Plan Note (Signed)
He reports a good benefit at the 50mg  dose of sildenafil without any side effects to report. Will reorder at this dose again today.

## 2017-02-13 NOTE — Addendum Note (Signed)
Addended by: Joni Reining C on: 02/13/2017 02:49 PM   Modules accepted: Level of Service

## 2017-02-13 NOTE — Progress Notes (Signed)
Internal Medicine Clinic Attending  Case discussed with Dr. Rice at the time of the visit.  We reviewed the resident's history and exam and pertinent patient test results.  I agree with the assessment, diagnosis, and plan of care documented in the resident's note.  

## 2017-02-15 MED FILL — SM ALCOHOL 70% PREP PADS: 70 | 90 days supply | Qty: 400 | Fill #1

## 2017-03-01 MED FILL — SIMETHICONE 180 MG SOFTGEL: 180 | 90 days supply | Qty: 270 | Fill #1

## 2017-03-05 ENCOUNTER — Other Ambulatory Visit (HOSPITAL_COMMUNITY)
Admission: RE | Admit: 2017-03-05 | Discharge: 2017-03-05 | Disposition: A | Discharge status: Home / Self Care | Payer: 59 | Source: Ambulatory Visit | Attending: Infectious Disease | Admitting: Infectious Disease

## 2017-03-05 ENCOUNTER — Other Ambulatory Visit: Payer: 59

## 2017-03-05 DIAGNOSIS — B20 Human immunodeficiency virus [HIV] disease: Secondary | ICD-10-CM | POA: Insufficient documentation

## 2017-03-05 DIAGNOSIS — Z79899 Other long term (current) drug therapy: Secondary | ICD-10-CM | POA: Diagnosis not present

## 2017-03-05 LAB — COMPLETE METABOLIC PANEL WITH GFR
ALT: 50 U/L — AB (ref 9–46)
AST: 37 U/L — AB (ref 10–35)
Albumin: 4.2 g/dL (ref 3.6–5.1)
Alkaline Phosphatase: 92 U/L (ref 40–115)
BILIRUBIN TOTAL: 0.5 mg/dL (ref 0.2–1.2)
BUN: 21 mg/dL (ref 7–25)
CALCIUM: 9.2 mg/dL (ref 8.6–10.3)
CO2: 29 mmol/L (ref 20–31)
CREATININE: 1.36 mg/dL — AB (ref 0.70–1.33)
Chloride: 100 mmol/L (ref 98–110)
GFR, EST AFRICAN AMERICAN: 66 mL/min (ref >=60)
GFR, Est Non African American: 57 mL/min — ABNORMAL LOW (ref >=60)
Glucose, Bld: 107 mg/dL — ABNORMAL HIGH (ref 65–99)
Potassium: 3.8 mmol/L (ref 3.5–5.3)
Sodium: 137 mmol/L (ref 135–146)
TOTAL PROTEIN: 6.9 g/dL (ref 6.1–8.1)

## 2017-03-05 LAB — LIPID PANEL
Cholesterol: 152 mg/dL (ref <200)
HDL: 43 mg/dL (ref >40)
LDL Cholesterol: 91 mg/dL (ref <100)
Total CHOL/HDL Ratio: 3.5 Ratio (ref <5.0)
Triglycerides: 91 mg/dL (ref <150)
VLDL: 18 mg/dL (ref <30)

## 2017-03-05 LAB — CBC WITH DIFFERENTIAL/PLATELET
BASOS PCT: 1 %
Basophils Absolute: 45 cells/uL (ref 0–200)
EOS PCT: 4 %
Eosinophils Absolute: 180 cells/uL (ref 15–500)
HCT: 43.9 % (ref 38.5–50.0)
Hemoglobin: 14.7 g/dL (ref 13.2–17.1)
Lymphocytes Relative: 45 %
Lymphs Abs: 2025 cells/uL (ref 850–3900)
MCH: 27.5 pg (ref 27.0–33.0)
MCHC: 33.5 g/dL (ref 32.0–36.0)
MCV: 82.1 fL (ref 80.0–100.0)
MPV: 8.9 fL (ref 7.5–12.5)
Monocytes Absolute: 405 cells/uL (ref 200–950)
Monocytes Relative: 9 %
NEUTROS ABS: 1845 {cells}/uL (ref 1500–7800)
Neutrophils Relative %: 41 %
PLATELETS: 214 10*3/uL (ref 140–400)
RBC: 5.35 MIL/uL (ref 4.20–5.80)
RDW: 14.1 % (ref 11.0–15.0)
WBC: 4.5 10*3/uL (ref 3.8–10.8)

## 2017-03-06 LAB — URINE CYTOLOGY ANCILLARY ONLY
CHLAMYDIA, DNA PROBE: NEGATIVE
NEISSERIA GONORRHEA: NEGATIVE

## 2017-03-06 LAB — RPR

## 2017-03-06 LAB — T-HELPER CELL (CD4) - (RCID CLINIC ONLY)
CD4 T CELL ABS: 720 /uL (ref 400–2700)
CD4 T CELL HELPER: 37 % (ref 33–55)

## 2017-03-06 MED FILL — HUMALOG 100 UNITS/ML KWIKPE: 100 | 38 days supply | Qty: 15 | Fill #2

## 2017-03-06 MED FILL — VERAPAMIL ER 240 MG TABLET: 240 | 90 days supply | Qty: 90 | Fill #2

## 2017-03-07 LAB — HIV-1 RNA QUANT-NO REFLEX-BLD
HIV 1 RNA Quant: <20 copies/mL
HIV-1 RNA QUANT, LOG: NOT DETECTED {Log_copies}/mL

## 2017-03-08 MED FILL — CYCLOBENZAPRINE 5 MG TABLET: 5 | 30 days supply | Qty: 30 | Fill #1

## 2017-03-13 MED FILL — ACCU-CHEK FASTCLIX LANCETS: 75 days supply | Qty: 306 | Fill #1

## 2017-03-14 ENCOUNTER — Encounter: Payer: Self-pay | Admitting: Infectious Disease

## 2017-03-14 ENCOUNTER — Ambulatory Visit (INDEPENDENT_AMBULATORY_CARE_PROVIDER_SITE_OTHER): Payer: 59 | Admitting: Infectious Disease

## 2017-03-14 VITALS — BP 132/79 | HR 73 | Temp 97.6°F | Ht 74.0 in | Wt 226.0 lb

## 2017-03-14 DIAGNOSIS — N289 Disorder of kidney and ureter, unspecified: Secondary | ICD-10-CM | POA: Diagnosis not present

## 2017-03-14 DIAGNOSIS — E782 Mixed hyperlipidemia: Secondary | ICD-10-CM | POA: Diagnosis not present

## 2017-03-14 DIAGNOSIS — R740 Nonspecific elevation of levels of transaminase and lactic acid dehydrogenase [LDH]: Secondary | ICD-10-CM

## 2017-03-14 DIAGNOSIS — E1140 Type 2 diabetes mellitus with diabetic neuropathy, unspecified: Secondary | ICD-10-CM

## 2017-03-14 DIAGNOSIS — B20 Human immunodeficiency virus [HIV] disease: Secondary | ICD-10-CM | POA: Diagnosis not present

## 2017-03-14 DIAGNOSIS — Z794 Long term (current) use of insulin: Secondary | ICD-10-CM

## 2017-03-14 DIAGNOSIS — R7401 Elevation of levels of liver transaminase levels: Secondary | ICD-10-CM

## 2017-03-14 MED FILL — VICTOZA 2-PAK 18 MG/3 ML PE: 18 | 90 days supply | Qty: 18 | Fill #2

## 2017-03-14 MED FILL — SILDENAFIL 50 MG TABLET: 50 | 30 days supply | Qty: 6 | Fill #1

## 2017-03-14 MED FILL — ACCU-CHEK GUIDE TEST STRIP: 25 days supply | Qty: 100 | Fill #4

## 2017-03-14 MED FILL — DESCOVY 200-25 MG TABS: 200-25 | 30 days supply | Qty: 30 | Fill #6

## 2017-03-14 MED FILL — TIVICAY 50 MG TABLET: 50 | 30 days supply | Qty: 30 | Fill #6

## 2017-03-14 MED FILL — UNIFINE PENTIPS 8MM 31G: 31G X 8 MM | 33 days supply | Qty: 200 | Fill #1
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
## 2017-03-27 MED FILL — ATORVASTATIN 40 MG TABLET: 40 | 90 days supply | Qty: 90 | Fill #1

## 2017-03-27 MED FILL — TRIAMTERENE/HCTZ 75/50 TAB: 75-50 | 90 days supply | Qty: 90 | Fill #1

## 2017-04-03 MED FILL — ASPIRIN EC 81 MG TABLET: 81 | 90 days supply | Qty: 90 | Fill #1

## 2017-04-03 MED FILL — CYCLOBENZAPRINE 5 MG TABLET: 5 | 30 days supply | Qty: 30 | Fill #0

## 2017-04-10 MED FILL — TIVICAY 50 MG TABLET: 50 | 30 days supply | Qty: 30 | Fill #7

## 2017-04-10 MED FILL — DESCOVY 200-25 MG TABS: 200-25 | 30 days supply | Qty: 30 | Fill #7

## 2017-04-12 ENCOUNTER — Other Ambulatory Visit: Payer: Self-pay | Admitting: Internal Medicine

## 2017-04-12 DIAGNOSIS — E1140 Type 2 diabetes mellitus with diabetic neuropathy, unspecified: Secondary | ICD-10-CM

## 2017-04-12 DIAGNOSIS — Z794 Long term (current) use of insulin: Principal | ICD-10-CM

## 2017-04-12 MED FILL — GABAPENTIN 600 MG TABLET: 600 | 90 days supply | Qty: 270 | Fill #0

## 2017-04-12 NOTE — Telephone Encounter (Signed)
Pt's sig other called and was upset, stating the request had been sent twice, I see only one time documentation of request which is today, I forwarded request to attending and dr rice and attending, dr Evette Doffing sent approved request to op pharm, called and checked it is there and being filled

## 2017-04-17 ENCOUNTER — Encounter: Payer: Self-pay | Admitting: Internal Medicine

## 2017-04-18 ENCOUNTER — Other Ambulatory Visit: Payer: Self-pay | Admitting: *Deleted

## 2017-04-18 DIAGNOSIS — G8929 Other chronic pain: Secondary | ICD-10-CM

## 2017-04-18 DIAGNOSIS — M5441 Lumbago with sciatica, right side: Principal | ICD-10-CM

## 2017-04-18 MED FILL — CYCLOBENZAPRINE 10 MG TAB: 10 | 30 days supply | Qty: 90 | Fill #0

## 2017-04-19 MED ORDER — CYCLOBENZAPRINE HCL 10 MG PO TABS: 10.0000 mg | ORAL_TABLET | Freq: Three times a day (TID) | ORAL | 2 refills | Status: DC | PRN

## 2017-04-23 MED FILL — LANTUS SOLOSTAR 100 UNITS/M: 100 | 90 days supply | Qty: 21 | Fill #3

## 2017-04-23 MED FILL — HUMALOG 100 UNITS/ML KWIKPE: 100 | 38 days supply | Qty: 15 | Fill #3

## 2017-04-23 MED FILL — UNIFINE PENTIPS 8MM 31G: 31G X 8 MM | 33 days supply | Qty: 200 | Fill #2

## 2017-04-30 MED FILL — SM ALCOHOL 70% PREP PADS: 70 | 90 days supply | Qty: 400 | Fill #2

## 2017-05-04 ENCOUNTER — Other Ambulatory Visit: Payer: Self-pay | Admitting: *Deleted

## 2017-05-08 MED FILL — PANTOPRAZOLE SOD DR 40 MG T: 40 | 90 days supply | Qty: 90 | Fill #1

## 2017-05-08 MED FILL — LOSARTAN POTASSIUM 50 MG TA: 50 | 90 days supply | Qty: 90 | Fill #1

## 2017-05-14 ENCOUNTER — Encounter: Payer: Self-pay | Admitting: Internal Medicine

## 2017-05-14 ENCOUNTER — Ambulatory Visit (INDEPENDENT_AMBULATORY_CARE_PROVIDER_SITE_OTHER): Payer: 59 | Admitting: Internal Medicine

## 2017-05-14 ENCOUNTER — Observation Stay (HOSPITAL_COMMUNITY)
Admission: AD | Admit: 2017-05-14 | Discharge: 2017-05-15 | Disposition: A | Discharge status: Home / Self Care | Payer: 59 | Source: Ambulatory Visit | Attending: Oncology | Admitting: Oncology

## 2017-05-14 ENCOUNTER — Ambulatory Visit (HOSPITAL_COMMUNITY)
Admission: RE | Admit: 2017-05-14 | Discharge: 2017-05-14 | Disposition: A | Discharge status: Home / Self Care | Payer: 59 | Source: Ambulatory Visit | Attending: Internal Medicine | Admitting: Internal Medicine

## 2017-05-14 ENCOUNTER — Encounter (HOSPITAL_COMMUNITY): Payer: Self-pay | Admitting: General Practice

## 2017-05-14 ENCOUNTER — Ambulatory Visit (HOSPITAL_COMMUNITY)
Admission: RE | Admit: 2017-05-14 | Discharge: 2017-05-14 | Disposition: A | Discharge status: Home / Self Care | Payer: 59 | Source: Ambulatory Visit | Attending: Family Medicine | Admitting: Family Medicine

## 2017-05-14 ENCOUNTER — Ambulatory Visit
Admission: RE | Admit: 2017-05-14 | Discharge: 2017-05-14 | Disposition: A | Discharge status: Home / Self Care | Payer: 59 | Source: Ambulatory Visit | Attending: Internal Medicine | Admitting: Internal Medicine

## 2017-05-14 VITALS — BP 136/85 | HR 68 | Temp 97.9°F | Wt 229.3 lb

## 2017-05-14 DIAGNOSIS — D6859 Other primary thrombophilia: Secondary | ICD-10-CM | POA: Diagnosis present

## 2017-05-14 DIAGNOSIS — I10 Essential (primary) hypertension: Secondary | ICD-10-CM | POA: Diagnosis present

## 2017-05-14 DIAGNOSIS — E785 Hyperlipidemia, unspecified: Secondary | ICD-10-CM | POA: Diagnosis not present

## 2017-05-14 DIAGNOSIS — R0609 Other forms of dyspnea: Principal | ICD-10-CM

## 2017-05-14 DIAGNOSIS — Z87891 Personal history of nicotine dependence: Secondary | ICD-10-CM | POA: Insufficient documentation

## 2017-05-14 DIAGNOSIS — E1140 Type 2 diabetes mellitus with diabetic neuropathy, unspecified: Secondary | ICD-10-CM | POA: Diagnosis present

## 2017-05-14 DIAGNOSIS — R0600 Dyspnea, unspecified: Secondary | ICD-10-CM | POA: Diagnosis present

## 2017-05-14 DIAGNOSIS — B20 Human immunodeficiency virus [HIV] disease: Secondary | ICD-10-CM | POA: Diagnosis present

## 2017-05-14 DIAGNOSIS — E119 Type 2 diabetes mellitus without complications: Secondary | ICD-10-CM | POA: Diagnosis not present

## 2017-05-14 DIAGNOSIS — R600 Localized edema: Secondary | ICD-10-CM

## 2017-05-14 DIAGNOSIS — R7989 Other specified abnormal findings of blood chemistry: Secondary | ICD-10-CM | POA: Diagnosis not present

## 2017-05-14 DIAGNOSIS — I2699 Other pulmonary embolism without acute cor pulmonale: Secondary | ICD-10-CM | POA: Diagnosis not present

## 2017-05-14 DIAGNOSIS — Z794 Long term (current) use of insulin: Secondary | ICD-10-CM | POA: Insufficient documentation

## 2017-05-14 DIAGNOSIS — Z86718 Personal history of other venous thrombosis and embolism: Secondary | ICD-10-CM

## 2017-05-14 DIAGNOSIS — R0602 Shortness of breath: Secondary | ICD-10-CM | POA: Diagnosis not present

## 2017-05-14 DIAGNOSIS — K219 Gastro-esophageal reflux disease without esophagitis: Secondary | ICD-10-CM | POA: Diagnosis present

## 2017-05-14 HISTORY — DX: Other pulmonary embolism without acute cor pulmonale: I26.99

## 2017-05-14 HISTORY — DX: Type 2 diabetes mellitus without complications: E11.9

## 2017-05-14 LAB — COMPREHENSIVE METABOLIC PANEL
ALT: 54 U/L (ref 17–63)
AST: 36 U/L (ref 15–41)
Albumin: 4 g/dL (ref 3.5–5.0)
Alkaline Phosphatase: 101 U/L (ref 38–126)
Anion gap: 6 (ref 5–15)
BUN: 12 mg/dL (ref 6–20)
CO2: 28 mmol/L (ref 22–32)
Calcium: 8.9 mg/dL (ref 8.9–10.3)
Chloride: 104 mmol/L (ref 101–111)
Creatinine, Ser: 1.24 mg/dL (ref 0.61–1.24)
Glucose, Bld: 85 mg/dL (ref 65–99)
POTASSIUM: 4.1 mmol/L (ref 3.5–5.1)
Sodium: 138 mmol/L (ref 135–145)
Total Bilirubin: 0.5 mg/dL (ref 0.3–1.2)
Total Protein: 6.7 g/dL (ref 6.5–8.1)

## 2017-05-14 LAB — GLUCOSE, CAPILLARY
GLUCOSE-CAPILLARY: 78 mg/dL (ref 65–99)
Glucose-Capillary: 84 mg/dL (ref 65–99)
Glucose-Capillary: 108 mg/dL — ABNORMAL HIGH (ref 65–99)

## 2017-05-14 LAB — CBC
HEMATOCRIT: 43.7 % (ref 39.0–52.0)
HEMOGLOBIN: 14.6 g/dL (ref 13.0–17.0)
MCH: 26.8 pg (ref 26.0–34.0)
MCHC: 33.4 g/dL (ref 30.0–36.0)
MCV: 80.2 fL (ref 78.0–100.0)
Platelets: 175 10*3/uL (ref 150–400)
RBC: 5.45 MIL/uL (ref 4.22–5.81)
RDW: 13.6 % (ref 11.5–15.5)
WBC: 5.9 10*3/uL (ref 4.0–10.5)

## 2017-05-14 LAB — POCT GLYCOSYLATED HEMOGLOBIN (HGB A1C): HEMOGLOBIN A1C: 8

## 2017-05-14 LAB — D-DIMER, QUANTITATIVE (NOT AT ARMC): D-Dimer, Quant: 5.87 ug/mL-FEU — ABNORMAL HIGH (ref 0.00–0.50)

## 2017-05-14 LAB — BRAIN NATRIURETIC PEPTIDE: B NATRIURETIC PEPTIDE 5: 6.5 pg/mL (ref 0.0–100.0)

## 2017-05-14 MED ORDER — EMTRICITABINE-TENOFOVIR AF 200-25 MG PO TABS
1.0000 | ORAL_TABLET | Freq: Every day | ORAL | Status: DC
  Administered 2017-05-15: 1 via ORAL
  Filled 2017-05-14: qty 1

## 2017-05-14 MED ORDER — INSULIN ASPART 100 UNIT/ML ~~LOC~~ SOLN: 0.0000 [IU] | Freq: Every day | SUBCUTANEOUS | Status: DC

## 2017-05-14 MED ORDER — PANTOPRAZOLE SODIUM 40 MG PO TBEC: 40.0000 mg | DELAYED_RELEASE_TABLET | Freq: Every day | ORAL | Status: DC

## 2017-05-14 MED ORDER — ENOXAPARIN SODIUM 100 MG/ML ~~LOC~~ SOLN
100.0000 mg | Freq: Two times a day (BID) | SUBCUTANEOUS | Status: DC
  Administered 2017-05-14 – 2017-05-15 (×2): 100 mg via SUBCUTANEOUS
  Filled 2017-05-14 (×2): qty 1

## 2017-05-14 MED ORDER — VERAPAMIL HCL ER 240 MG PO TBCR
240.0000 mg | EXTENDED_RELEASE_TABLET | Freq: Every day | ORAL | Status: DC
  Administered 2017-05-15: 240 mg via ORAL
  Filled 2017-05-14: qty 1

## 2017-05-14 MED ORDER — IOPAMIDOL (ISOVUE-370) INJECTION 76%
80.0000 mL | Freq: Once | INTRAVENOUS | Status: AC | PRN
  Administered 2017-05-14: 80 mL via INTRAVENOUS

## 2017-05-14 MED ORDER — ACETAMINOPHEN 650 MG RE SUPP: 650.0000 mg | Freq: Four times a day (QID) | RECTAL | Status: DC | PRN

## 2017-05-14 MED ORDER — GABAPENTIN 600 MG PO TABS
600.0000 mg | ORAL_TABLET | Freq: Three times a day (TID) | ORAL | Status: DC
  Administered 2017-05-14 – 2017-05-15 (×2): 600 mg via ORAL
  Filled 2017-05-14 (×2): qty 1

## 2017-05-14 MED ORDER — ACETAMINOPHEN 325 MG PO TABS: 650.0000 mg | ORAL_TABLET | Freq: Four times a day (QID) | ORAL | Status: DC | PRN

## 2017-05-14 MED ORDER — SODIUM CHLORIDE 0.9% FLUSH
3.0000 mL | Freq: Two times a day (BID) | INTRAVENOUS | Status: DC
Start: 2017-05-14 — End: 2017-05-15
  Administered 2017-05-14: 3 mL via INTRAVENOUS

## 2017-05-14 MED ORDER — CYCLOBENZAPRINE HCL 10 MG PO TABS: 10.0000 mg | ORAL_TABLET | Freq: Three times a day (TID) | ORAL | Status: DC | PRN

## 2017-05-14 MED ORDER — PANTOPRAZOLE SODIUM 40 MG PO TBEC
40.0000 mg | DELAYED_RELEASE_TABLET | Freq: Every day | ORAL | Status: DC
  Administered 2017-05-15: 40 mg via ORAL
  Filled 2017-05-14: qty 1

## 2017-05-14 MED ORDER — ASPIRIN EC 81 MG PO TBEC
81.0000 mg | DELAYED_RELEASE_TABLET | Freq: Every day | ORAL | Status: DC
  Administered 2017-05-15: 81 mg via ORAL
  Filled 2017-05-14: qty 1

## 2017-05-14 MED ORDER — DOLUTEGRAVIR SODIUM 50 MG PO TABS
50.0000 mg | ORAL_TABLET | Freq: Every day | ORAL | Status: DC
  Administered 2017-05-15: 50 mg via ORAL
  Filled 2017-05-14: qty 1

## 2017-05-14 MED ORDER — INSULIN ASPART 100 UNIT/ML ~~LOC~~ SOLN
0.0000 [IU] | Freq: Three times a day (TID) | SUBCUTANEOUS | Status: DC
  Administered 2017-05-15: 2 [IU] via SUBCUTANEOUS
  Administered 2017-05-15: 3 [IU] via SUBCUTANEOUS

## 2017-05-14 MED ORDER — ATORVASTATIN CALCIUM 40 MG PO TABS
40.0000 mg | ORAL_TABLET | Freq: Every day | ORAL | Status: DC
  Administered 2017-05-15: 40 mg via ORAL
  Filled 2017-05-14: qty 1

## 2017-05-14 MED ORDER — INSULIN GLARGINE 100 UNIT/ML ~~LOC~~ SOLN
15.0000 [IU] | Freq: Every day | SUBCUTANEOUS | Status: DC
  Administered 2017-05-15: 15 [IU] via SUBCUTANEOUS
  Filled 2017-05-14: qty 0.15

## 2017-05-14 MED FILL — TIVICAY 50 MG TABLET: 50 | 30 days supply | Qty: 30 | Fill #8

## 2017-05-14 MED FILL — ACCU-CHEK FASTCLIX LANCETS: 75 days supply | Qty: 306 | Fill #2

## 2017-05-14 MED FILL — DESCOVY 200-25 MG TABS: 200-25 | 30 days supply | Qty: 30 | Fill #8

## 2017-05-14 MED FILL — ACCU-CHEK GUIDE TEST STRIP: 75 days supply | Qty: 300 | Fill #5

## 2017-05-14 NOTE — Assessment & Plan Note (Addendum)
His worsening exertional dyspnea was worrisome.  He has risk factors of previous DVT, last vascular ultrasound done in 2013 shows persistent of thrombus. No current risk factor or clinical signs of DVT. His Well's score today was 1.5, which makes him lower risk. He was desaturating up to 91% with ambulation. His chest was clear on exam. He was having mildly elevated JVD and trace lower extremity edema.  We did stat d-dimer, BNP, CBC, CMP, chest x-ray and EKG. D-dimer was elevated at 5.87 and EKG shows multiple PVCs with borderline LVH.Rest of the labs were unremarkable.  -CT angio to rule out pulmonary embolism,scheduled for 3:30 PM today. -CTA chest was positive for moderate  pulmonary embolism with some right heart strain pattern.  -discussed with the patient, we will admit patient for observation and further management.

## 2017-05-14 NOTE — Progress Notes (Signed)
   CC: Progressively worsening shortness of breath for 3 weeks.  HPI:  [name] is a 56 y.o. gentleman with past medical history as listed below came to the clinic with complaint of worsening dyspnea on exertion, started approximately 3 weeks ago. It has worsened to the point that he cannot walk around his house without being dyspneic. This morning he become very dyspneic after ruling out trash can,hich prompted his partner to bring him to the clinic.  According to patient his shortness of breath started approximately 3 weeks ago, he attributes this to frequent rain during that time and him doing some yard work. He denies any wheezing or coughing. He does not has any history of asthma or COPD. He denies any chest pain, he do complained of mild chest discomfort with exertion,denies any associated palpitations, diaphoresis,orthopnea or PND. He uses 2-3 pillows for many years because of his snoring. He denies any recent travel or prolonged immobilization. He denies any recent illness. He denies any lower extremity edema. He do have an history of DVT in 2012, used anticoagulation for about a year. He is not on any anticoagulation currently. 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 According to patient he is compliant with his medications.  He denies any other symptoms.  He quit smoking more than 10 years ago, rarely drink alcohol, and denied any other illicit drug use.  Past Medical History:  Diagnosis Date  . Allergic rhinitis   . Degenerative joint disease of knee   . Diabetes mellitus 9/09  . DVT (deep venous thrombosis) ([place])    Ended anticoagulation in 2012  . GERD (gastroesophageal reflux disease)   . 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 HIV infection ([place])   . Hx MRSA infection   . Hyperlipidemia   . Hypertension   . Superficial thrombophlebitis    Review of Systems:  As per HPI.  Physical Exam:  Vitals:   05/14/17 1056 05/14/17  1101  BP: 136/85   Pulse: 68   Temp: 97.9 F (36.6 C)   TempSrc: Oral   SpO2: 99% 91%  Weight: 229 lb 4.8 oz (104 kg)     General: Vital signs reviewed.  Patient is well-developed and well-nourished, in no acute distress and cooperative with exam.  Head: Normocephalic and atraumatic. Eyes: EOMI, conjunctivae normal, no scleral icterus.  Neck: Supple, trachea midline, normal ROM, JVD, no masses, thyromegaly, or carotid bruit present.  Cardiovascular: RRR, S1 normal, S2 normal, no murmurs, gallops, or rubs. Pulmonary/Chest: Clear to auscultation bilaterally, no wheezes, rales, or rhonchi. Abdominal: Soft, non-tender, non-distended, BS +, no masses, organomegaly, or guarding present.  Extremities: Trace lower extremity edema bilaterally, tenderness, pulses symmetric and intact bilaterally. No cyanosis or clubbing. Skin: Warm, dry and intact. No rashes or erythema. Psychiatric: Normal mood and affect. speech and behavior is normal. Cognition and memory are normal.  Assessment & Plan:   See Encounters Tab for problem based charting.  Patient discussed with Dr. Lynnae January.

## 2017-05-14 NOTE — Progress Notes (Signed)
ANTICOAGULATION CONSULT NOTE - Initial Consult  Pharmacy Consult for Lovenox Indication: pulmonary embolus  Allergies  Allergen Reactions  . Amoxicillin-Pot Clavulanate     REACTION: rash    Patient Measurements: Height: 6\' 2"  (188 cm) Weight: 223 lb 11.2 oz (101.5 kg) IBW/kg (Calculated) : 82.2   Vital Signs: Temp: 97.8 F (36.6 C) (08/13 1716) Temp Source: Oral (08/13 1716) BP: 152/96 (08/13 1716) Pulse Rate: 64 (08/13 1716)  Labs:  Recent Labs  05/14/17 1135  HGB 14.6  HCT 43.7  PLT 175  CREATININE 1.24    Estimated Creatinine Clearance: 83.6 mL/min (by C-G formula based on SCr of 1.24 mg/dL).   Medical History: Past Medical History:  Diagnosis Date  . Allergic rhinitis   . Degenerative joint disease of knee   . Diabetes mellitus 9/09  . DVT (deep venous thrombosis) ([place])    Ended anticoagulation in 2012  . GERD (gastroesophageal reflux disease)   . 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 HIV infection ([place])   . Hx MRSA infection   . Hyperlipidemia   . Hypertension   . Superficial thrombophlebitis      Assessment: 64 YOM who became more dyspneic on exertion this morning and was brought to IMTS clinic. Has been having SOB x3 weeks PTA. He has history of DVT in 2012 and was on anticoagulation for ~1 year after that event, but is not on anticoagulation at this time. D-dimer elevated, CTA positive for moderate PE with some right heart strain.  Hgb 14.6, plts 175. SCr 1.24, CrCl ~80-54mL/min.   Goal of Therapy:  Anti-Xa level 0.6-1 units/ml 4hrs after LMWH dose given Monitor platelets by anticoagulation protocol: Yes   Plan:  Lovenox 100mg  (1mg /kg) subQ q12h CBC q72h at minimum Follow for long term anticoagulation plan   Zya Finkle D. Aleayah Chico, PharmD, [place] Clinical Pharmacist Pager: [phone] 05/14/2017 5:33 PM
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
## 2017-05-14 NOTE — Assessment & Plan Note (Signed)
His A1c today was 8.0, elevated from his previous A1c of 7.2 in May 2018. He is on Lantus 30 units at bedtime, Humalog 10 units 4 times a day, Victoza 1.8 mg daily. According to patient he is compliant with his medication.  He didn't want to bring his glucometer meter or log today. According to patient his blood sugar remained between 100 to 130 most of the time,occasionally increased with increase carbohydrate intake, with couple of hypoglycemic events up to 75 with symptoms requiring eating some food to relieve them.  -I will continue with the existing regimen, and he should bring his glucometer meter during next follow-up visit his PCP. He was consult regarding watching his diet. Necessity changes can be made during next follow-up visit.

## 2017-05-14 NOTE — Assessment & Plan Note (Signed)
BP Readings from Last 3 Encounters:  05/14/17 136/85  03/14/17 132/79  02/09/17 140/78   His blood pressure was within goal.  Continue current medication.

## 2017-05-14 NOTE — Patient Instructions (Signed)
Thank you for visiting clinic today. We will admit you for further management and observation because of your clot in lungs.

## 2017-05-15 ENCOUNTER — Other Ambulatory Visit: Payer: Self-pay | Admitting: Pharmacist

## 2017-05-15 DIAGNOSIS — Z87891 Personal history of nicotine dependence: Secondary | ICD-10-CM | POA: Diagnosis not present

## 2017-05-15 DIAGNOSIS — Z86718 Personal history of other venous thrombosis and embolism: Secondary | ICD-10-CM

## 2017-05-15 DIAGNOSIS — Z79899 Other long term (current) drug therapy: Secondary | ICD-10-CM

## 2017-05-15 DIAGNOSIS — Z21 Asymptomatic human immunodeficiency virus [HIV] infection status: Secondary | ICD-10-CM

## 2017-05-15 DIAGNOSIS — B20 Human immunodeficiency virus [HIV] disease: Secondary | ICD-10-CM | POA: Diagnosis not present

## 2017-05-15 DIAGNOSIS — Z794 Long term (current) use of insulin: Secondary | ICD-10-CM

## 2017-05-15 DIAGNOSIS — E119 Type 2 diabetes mellitus without complications: Secondary | ICD-10-CM | POA: Diagnosis not present

## 2017-05-15 DIAGNOSIS — E785 Hyperlipidemia, unspecified: Secondary | ICD-10-CM

## 2017-05-15 DIAGNOSIS — I10 Essential (primary) hypertension: Secondary | ICD-10-CM | POA: Diagnosis not present

## 2017-05-15 DIAGNOSIS — Z832 Family history of diseases of the blood and blood-forming organs and certain disorders involving the immune mechanism: Secondary | ICD-10-CM | POA: Diagnosis not present

## 2017-05-15 DIAGNOSIS — I2699 Other pulmonary embolism without acute cor pulmonale: Secondary | ICD-10-CM

## 2017-05-15 DIAGNOSIS — Z880 Allergy status to penicillin: Secondary | ICD-10-CM | POA: Diagnosis not present

## 2017-05-15 LAB — CBC
HCT: 41.1 % (ref 39.0–52.0)
Hemoglobin: 13.8 g/dL (ref 13.0–17.0)
MCH: 26.7 pg (ref 26.0–34.0)
MCHC: 33.6 g/dL (ref 30.0–36.0)
MCV: 79.7 fL (ref 78.0–100.0)
Platelets: 179 10*3/uL (ref 150–400)
RBC: 5.16 MIL/uL (ref 4.22–5.81)
RDW: 13.5 % (ref 11.5–15.5)
WBC: 5.3 10*3/uL (ref 4.0–10.5)

## 2017-05-15 LAB — BASIC METABOLIC PANEL
Anion gap: 7 (ref 5–15)
BUN: 14 mg/dL (ref 6–20)
CHLORIDE: 101 mmol/L (ref 101–111)
CO2: 30 mmol/L (ref 22–32)
CREATININE: 1.28 mg/dL — AB (ref 0.61–1.24)
Calcium: 8.7 mg/dL — ABNORMAL LOW (ref 8.9–10.3)
GFR calc Af Amer: >60 mL/min (ref >60)
GFR calc non Af Amer: >60 mL/min (ref >60)
GLUCOSE: 183 mg/dL — AB (ref 65–99)
Potassium: 3.7 mmol/L (ref 3.5–5.1)
Sodium: 138 mmol/L (ref 135–145)

## 2017-05-15 LAB — MRSA PCR SCREENING: MRSA by PCR: NEGATIVE

## 2017-05-15 LAB — GLUCOSE, CAPILLARY
GLUCOSE-CAPILLARY: 165 mg/dL — AB (ref 65–99)
Glucose-Capillary: 131 mg/dL — ABNORMAL HIGH (ref 65–99)

## 2017-05-15 MED ORDER — EDOXABAN TOSYLATE 60 MG PO TABS: 60.0000 mg | ORAL_TABLET | Freq: Every day | ORAL | 0 refills | Status: DC

## 2017-05-15 MED FILL — SAVAYSA 60 MG TABLET: 60 | 30 days supply | Qty: 30 | Fill #0

## 2017-05-15 NOTE — Progress Notes (Signed)
  Subjective:  Patient was seen and examined at bedside. No acute events overnight. He is able to ambulate to the restroom without issue. He denies any chest pain, shortness of breath or weakness. He voices understanding regarding risk and benefits of anticoagulant use and the need to take them indefinitely.   Objective: Vital signs in last 24 hours: Vitals:   05/14/17 1716 05/14/17 2044 05/15/17 0500  BP: (!) 152/96 (!) 141/78 123/78  Pulse: 64 63 69  Resp: 18 18 18   Temp: 97.8 F (36.6 C) (!) 97.5 F (36.4 C) 98.3 F (36.8 C)  TempSrc: Oral Oral Oral  SpO2: 99% 97% 97%  Weight: 101.5 kg (223 lb 11.2 oz)  101.2 kg (223 lb 1.7 oz)  Height: 6\' 2"  (1.88 m)     Weight change:   Intake/Output Summary (Last 24 hours) at 05/15/17 1116 Last data filed at 05/14/17 2200  Gross per 24 hour  Intake              240 ml  Output                0 ml  Net              240 ml   BP 123/78 (BP Location: Right Arm)   Pulse 69   Temp 98.3 F (36.8 C) (Oral)   Resp 18   Ht 6\' 2"  (1.88 m)   Wt 101.2 kg (223 lb 1.7 oz)   SpO2 97%   BMI 28.65 kg/m  General appearance: alert, cooperative and no distress Head: Normocephalic, without obvious abnormality, atraumatic Neck: no adenopathy, no carotid bruit, no JVD, supple, symmetrical, trachea midline and thyroid not enlarged, symmetric, no tenderness/mass/nodules Lungs: clear to auscultation bilaterally Heart: regular rate and rhythm, S1, S2 normal, no murmur, click, rub or gallop Extremities: Bilateral  trace edema present in lower extermities Skin: Skin color, texture, turgor normal. No rashes or lesions  Assessment/Plan: Pulmonary Embolism  The patient's symptoms and CTA results are consistent with an acute pulmonary embolism. On exam the patient was comfortable and experiencing no hypoxia. Patient will start anticoagulation therapy with edoxaban today, dose will be taken at 1800 when his Lovenox dose was initially scheduled. Lovenox was  discontinued.  He will need anti-coagulation indefinitely due to recurrent clot formation. Given the patient's right heart strain on CT scan he will be monitored on telemetry.   HIV  Continue home regimen of Tivicay and Deskovy while in the hospital.   Diabetes   Patient on 15 units of lantus, supplemented by sliding scale insulin. Continue gabapentin for diabetic neuropathy.   HTN and Hyperlipdemia  Present on admit. Continue home Verapamil and Lipitor.  Will restart Maxide and Losartan at discharge.   Disposition: Discharge today.  This is a Careers information officer Note.  The care of the patient was discussed with Dr. Beryle Beams and the assessment and plan formulated with their assistance.  Please see their attached note for official documentation of the daily encounter.   LOS: 0 days   Evelette Hollern, Juanda Chance, Medical Student 05/15/2017, 11:16 AM

## 2017-05-15 NOTE — Progress Notes (Addendum)
Edoxaban requires a prior auth for patient's insurance to cover. Submitted information for prior auth. Also working on enrolling patient for a copay card once prior Trevor Fischer is processed. Will try back tomorrow and will work with team in the meantime to facilitate enoxaparin.  I found a coupon for Savaysa which brought the copay down to $45. I spoke to [name] yesterday and he said he can afford it this month, not sure if he can continue to afford in the future.   His insurance denied the prior authorization for Savaysa and requires [name] to try Eliquis or Xarelto first. Do you want me to send them a letter of medical necessity?   I looked into the drug interactions with Trevor Fischer and this is what we found:   Enzyme of concern: CYP3A4  Tivicay is a minor 3A4 substrate (does not induce or inhibit in vitro)  Tenofovir alafenamide is metabolized by PGP and BCRP (in vitro it is a weak inhibitor of 3A4, not an inhibitor or inducer in vivo)  Emtracitabine is not known to have enzyme metabolism   Apixaban is metabolized by CYP3A4 by 15%, rivaroxaban 30%, and edoxaban < 10%.

## 2017-05-15 NOTE — Discharge Summary (Signed)
Name: Trevor Fischer MRN: [id number] DOB: [birth year] 56 y.o. PCP: Collier Salina, MD  Date of Admission: 05/14/2017  5:08 PM Date of Discharge: 05/15/2017 Attending Physician: Annia Belt, MD  Discharge Diagnosis: 1. Pulmonary Embolism  2. HIV  3.Diabetes  4.HTN  5.Hyperlidipemia  Discharge Medications: Allergies as of 05/15/2017      Reactions   Amoxicillin-pot Clavulanate Rash      Medication List    TAKE these medications   ASPIR-LOW 81 MG EC tablet Generic drug:  aspirin TAKE 1 TABLET BY MOUTH DAILY. What changed:  See the new instructions.   atorvastatin 40 MG tablet Commonly known as:  LIPITOR Take 1 tablet (40 mg total) by mouth daily.   chlorpheniramine 4 MG tablet Commonly known as:  CHLOR-TRIMETON Take 1 tablet (4 mg total) by mouth every 4 (four) hours as needed for allergies.   cyclobenzaprine 10 MG tablet Commonly known as:  FLEXERIL Take 1 tablet (10 mg total) by mouth 3 (three) times daily as needed for muscle spasms.   DESCOVY 200-25 MG tablet Generic drug:  emtricitabine-tenofovir AF TAKE 1 TABLET BY MOUTH DAILY.   edoxaban 60 MG Tabs tablet Commonly known as:  SAVAYSA Take 60 mg by mouth daily.   Fish Oil 1000 MG Caps Take 1 capsule by mouth 2 (two) times daily.   gabapentin 600 MG tablet Commonly known as:  NEURONTIN TAKE 1 TABLET BY MOUTH 3 TIMES DAILY. What changed:  See the new instructions.   Insulin Glargine 100 UNIT/ML Solostar Pen Commonly known as:  LANTUS SOLOSTAR Inject 30 Units into the skin daily at 10 pm. What changed:  when to take this   insulin lispro 100 UNIT/ML KiwkPen Commonly known as:  HUMALOG KWIKPEN Inject 0.1 mLs (10 Units total) into the skin 4 (four) times daily. Type 2 diabetes with long term use of insulin #11.42   liraglutide 18 MG/3ML Sopn Commonly known as:  VICTOZA Inject 0.3 mLs (1.8 mg total) into the skin daily. E11.65 What changed:  how much to take  additional  instructions   losartan 50 MG tablet Commonly known as:  COZAAR TAKE 1 TABLET BY MOUTH DAILY. What changed:  See the new instructions.   multivitamin with minerals tablet Take 1 tablet by mouth daily.   pantoprazole 40 MG tablet Commonly known as:  PROTONIX TAKE 1 TABLET BY MOUTH DAILY. What changed:  See the new instructions.   sildenafil 50 MG tablet Commonly known as:  VIAGRA Take 1 tablet (50 mg total) by mouth daily as needed for erectile dysfunction.   Simethicone 180 MG Caps Take 1 capsule (180 mg total) by mouth 3 (three) times daily.   TIVICAY 50 MG tablet Generic drug:  dolutegravir TAKE 1 TABLET BY MOUTH DAILY. What changed:  See the new instructions.   triamterene-hydrochlorothiazide 75-50 MG tablet Commonly known as:  MAXZIDE TAKE 1 TABLET BY MOUTH ONCE DAILY What changed:  See the new instructions.   verapamil 240 MG CR tablet Commonly known as:  CALAN-SR TAKE 1 TABLET BY MOUTH ONCE DAILY       Disposition and follow-up:   [name] was discharged from [facility] in Good condition.  At the hospital follow up visit please address:  1.  Improvement in PE symptoms   2.  Labs / imaging needed at time of follow-up: none  3.  Pending labs/ test needing follow-up: none  Follow-up Appointments: Follow-up Information    [facility] Follow up on 06/08/2017.   Why:  At 2:45 pm. Contact information: [address] [place] Hospital Course by problem list:   1. Pulmonary Embolism  Patient presented to [facility] outpatient Internal medicine clinic with a 3 week history of progressive dyspnea upon exertion accompanied by mild chest pain. D-dimer was 5.87.  A subsequent CTA was ordered and revealed a mild to moderate bilateral segmental and subsegmental pulmonary embolism. He was admitted to the inpatient service for observation. Upon admission he was  anti-coagulated with 1 mg/kg of lovenox. At time of admission and throughout hospital course he was in no respiratory distress. Upon discharge he was provided with a prescription for edoxaban for long term anticoagulation. Edoxaban was chosen as his anti-coagulant because of decreased chance for interaction with his anti-retroviral medication.   Due to the patient's prior history of two DVTs and family history of multiple blood clots in his mother, he should remain on anti-coagulation indefinitely.    HIV  The patient's HIV is well controlled, his viral load is undetectable.  Continue his home regimen of Tivicay and Deskovy while in the hospital.    Diabetes   Patient on 15u of Lantus plus sliding scale insulin while in the hospital. He is to resume home diabetes medications upon discharge   Hypertension  Losartan and Maxide were held upon admission due to the administration of CT contrast. Patient should resume all anti-hypertensive medications upon discharge.    Hyperlipidemia  Continue Lipitor.   Discharge Vitals:   BP (!) 141/74 (BP Location: Left Arm)   Pulse 74   Temp 98.4 F (36.9 C) (Oral)   Resp 18   Ht 6\' 2"  (1.88 m)   Wt 101.2 kg (223 lb 1.7 oz)   SpO2 95%   BMI 28.65 kg/m   Pertinent Labs, Studies, and Procedures:  CTA 05/15/2017  IMPRESSION: 1. Positive bilateral acute pulmonary emboli involving the lobar, segmental and subsegmental arterial branches. There is moderate clot burden with complete opacification of the segmental branch to the right lower lobe. No acute parenchymal findings.  2. CT evidence of right heart strain (RV/LV Ratio = 1.0) consistent with at least submassive (intermediate risk) PE. The presence of right heart strain has been associated with an increased risk of morbidity and mortality.  3. Incidental, subcentimeter hyperdense right renal mass. This is new from study dated 12/09/2007, but not further characterized on today's study.  Recommendation is for renal protocol MRI, or secondarily renal protocol CT, within 6-12 months. This recommendation follows ACR consensus guidelines: Management of the Incidental Renal Mass on CT: A White Paper of the ACR Incidental Findings Committee  Discharge Instructions: Discharge Instructions    Call MD for:  difficulty breathing, headache or visual disturbances    Complete by:  As directed    Call MD for:  persistant dizziness or light-headedness    Complete by:  As directed    Diet - low sodium heart healthy    Complete by:  As directed    Discharge instructions    Complete by:  As directed    It was a pleasure taking care of you during your hospitalization. You are being prescribed the anticoagulant edoxaban. Please be sure to take it precisely at 6:00PM this evening, and daily thereafter.  Please attend your follow-up appointment in the clinic. You are able to progress your activity as tolerated.   Increase activity slowly  Complete by:  As directed       Signed: Dwaine Deter, Medical Student 05/15/2017, 2:29 PM   Pager: [phone]

## 2017-05-15 NOTE — Care Management Note (Addendum)
Case Management Note  Patient Details  Name: Trevor Fischer MRN: [id number] Date of Birth: 1961/02/02  Subjective/Objective: Pt presented for SOB- CTA-Positive for Pulmonary Embolism. Benefits Check in process for Edoxaban 60mg  po daily vs Apixaban or Xarelto. CM will make MD/ Pt aware of cost once completed.   Action/Plan: Depending on choice CM will be able to provide discount card for Xarelto/ Apixaban.                    Expected Discharge Date:                  Expected Discharge Plan:  Home/Self Care  In-House Referral:  NA  Discharge planning Services  CM Consult, Medication Assistance  Post Acute Care Choice:  NA Choice offered to:  NA  DME Arranged:  N/A DME Agency:  NA  HH Arranged:  NA HH Agency:  NA  Status of Service:  Completed, signed off  If discussed at [place] of Stay Meetings, dates discussed:    Additional Comments: 6. S/W JOSHA  @ [facility] # [phone]  Edoxaban- Needs prior authorization and takes up to 24 hours to hear if approved.   Xarelto- $125.00 Co pay  ELIQUIS 10 MG - NOT ON FORMULARY   ELIQUIS 2.5 MG BID   COVER- YES  CO-PAY- $ 101.47 AND ( FOR 7 DAYS $30.00 )  TIER- 2 DRUG  PRIOR APPROVAL- NO    ELIQUSI 5 MG BID  COVER- YES  CO-PAY- $ 101.47  AND ( FOR 7 DAYS $30.00 )  TIER- 2 DRUG  PRIOR APPROVAL- NO   PHARMACY : [facility]    Pharmacist  did contact MD in regards to edoxaban (SAVAYSA) needing prior authorization. Plan will be for patient to d/c on edoxaban (SAVAYSA). CM did call [facility] and medication is available with coupon at $44.03. Pt is aware. No further needs from CM at this time.  Bethena Roys, RN 05/15/2017, 11:46 AM

## 2017-05-15 NOTE — Progress Notes (Signed)
Internal Medicine Clinic Attending  Case discussed with Dr. Amin at the time of the visit.  We reviewed the resident's history and exam and pertinent patient test results.  I agree with the assessment, diagnosis, and plan of care documented in the resident's note.    

## 2017-05-16 ENCOUNTER — Telehealth: Payer: Self-pay | Admitting: *Deleted

## 2017-05-16 ENCOUNTER — Other Ambulatory Visit (HOSPITAL_COMMUNITY): Payer: 59

## 2017-05-16 NOTE — Telephone Encounter (Signed)
Pt had in-pt order for echo but was discharged before echo appt which was today, 05/16/17 at [facility]. Echo dept stated pt's appt cancelled when he was d/c per their scheduling protocol and next available out-pt appt not until 8/24 and they would need new order as out-pt to schedule it. Dr. Juleen China was on in-pt team caring for pt 8/13-8/14/18 so consulted him r/t echo f/u, knowing pt had PCP appt with Dr. Benjamine Mola already for 06/08/17. His response:  "Hi - I was off yesterday so was not aware of the Echo order, but I think since he was clinically stable at discharge and started on appropriate therapy for his pulmonary embolus that it can wait to be discussed with Dr. Benjamine Mola at his hospital f/u on 9/7. If his symptoms were to progress or change, would suggest he make a sooner appt in [facility] to be evaluated and possibly have Echo sooner at that time.  Thanks!  Mitzi Hansen " This RN called pt to verify no further s/s that he had experienced prior to or during hospitalization and to share plan to wait to see Dr. Benjamine Mola to determine need for echo or any cardiac f/u. Pt stated he agreed with plan. He was able to teach back my instructions that if he had any other s/s such as chest pain, shortness of breath or any unusual other symptoms to go to ED or call for [facility] appt, when we could work him in to be seen in [facility] for further evaluation, as we had done on 05/14/17.  Yvonna Alanis, RN, 05/16/17, 1:36 PM

## 2017-05-17 ENCOUNTER — Telehealth: Payer: Self-pay | Admitting: Dietician

## 2017-05-17 NOTE — Telephone Encounter (Signed)
Call to offer support as needed. Patient is at home doing well.

## 2017-05-29 MED FILL — SILDENAFIL CITRATE 50 MG TA: 50 | 30 days supply | Qty: 6 | Fill #2

## 2017-05-29 MED FILL — HUMALOG 100 UNITS/ML KWIKPE: 100 | 38 days supply | Qty: 15 | Fill #4

## 2017-05-29 MED FILL — CYCLOBENZAPRINE 10 MG TAB: 10 | 30 days supply | Qty: 90 | Fill #1

## 2017-05-30 MED FILL — UNIFINE PENTIPS 8MM 31G: 31G X 8 MM | 33 days supply | Qty: 200 | Fill #3

## 2017-05-31 ENCOUNTER — Other Ambulatory Visit: Payer: Self-pay | Admitting: Pharmacist

## 2017-05-31 DIAGNOSIS — I2699 Other pulmonary embolism without acute cor pulmonale: Secondary | ICD-10-CM

## 2017-05-31 MED ORDER — EDOXABAN TOSYLATE 60 MG PO TABS: 60.0000 mg | ORAL_TABLET | Freq: Every day | ORAL | 0 refills | Status: DC

## 2017-06-05 ENCOUNTER — Encounter: Payer: Self-pay | Admitting: Internal Medicine

## 2017-06-05 MED FILL — SAVAYSA 60 MG TABLET: 60 | 30 days supply | Qty: 30 | Fill #0

## 2017-06-07 ENCOUNTER — Other Ambulatory Visit: Payer: Self-pay | Admitting: *Deleted

## 2017-06-07 ENCOUNTER — Encounter: Payer: Self-pay | Admitting: Internal Medicine

## 2017-06-07 DIAGNOSIS — I10 Essential (primary) hypertension: Secondary | ICD-10-CM

## 2017-06-07 MED ORDER — VERAPAMIL HCL ER 240 MG PO TBCR: 240.0000 mg | EXTENDED_RELEASE_TABLET | Freq: Every day | ORAL | 2 refills | Status: DC

## 2017-06-07 MED FILL — VERAPAMIL ER 240 MG TABLET: 240 | 90 days supply | Qty: 90 | Fill #0

## 2017-06-08 ENCOUNTER — Encounter: Payer: 59 | Admitting: Internal Medicine

## 2017-06-11 ENCOUNTER — Telehealth: Payer: Self-pay | Admitting: *Deleted

## 2017-06-11 ENCOUNTER — Other Ambulatory Visit: Payer: Self-pay | Admitting: Internal Medicine

## 2017-06-11 DIAGNOSIS — I2699 Other pulmonary embolism without acute cor pulmonale: Secondary | ICD-10-CM

## 2017-06-11 MED ORDER — EDOXABAN TOSYLATE 60 MG PO TABS: 60.0000 mg | ORAL_TABLET | Freq: Every day | ORAL | 0 refills | Status: DC

## 2017-06-11 NOTE — Telephone Encounter (Signed)
Information was sent to CoverMyMeds for PA consideration for Savaysa.  Awating decision from [facility].  Sander Nephew, RN 06/11/2017 11:33 AM.

## 2017-06-11 NOTE — Telephone Encounter (Signed)
Pt talked on and on about his complaints with insurance, informed him that a new refill had been sent in and would call pharm to get paperwork for possible PA. Called pharmacy, they state it is ready for pickup and copay is $44.00. They will call pt and speak w/ him

## 2017-06-11 NOTE — Telephone Encounter (Signed)
Pls call patient regarding refill

## 2017-06-18 MED FILL — VICTOZA 2-PAK 18 MG/3 ML PE: 18 | 90 days supply | Qty: 18 | Fill #3

## 2017-06-18 MED FILL — TIVICAY 50 MG TABLET: 50 | 30 days supply | Qty: 30 | Fill #9

## 2017-06-18 MED FILL — DESCOVY 200-25 MG TABS: 200-25 | 30 days supply | Qty: 30 | Fill #9

## 2017-06-22 ENCOUNTER — Ambulatory Visit (INDEPENDENT_AMBULATORY_CARE_PROVIDER_SITE_OTHER): Payer: 59 | Admitting: Internal Medicine

## 2017-06-22 ENCOUNTER — Encounter: Payer: Self-pay | Admitting: Internal Medicine

## 2017-06-22 ENCOUNTER — Other Ambulatory Visit: Payer: Self-pay | Admitting: Internal Medicine

## 2017-06-22 ENCOUNTER — Encounter: Payer: 59 | Admitting: Internal Medicine

## 2017-06-22 VITALS — BP 135/83 | HR 43 | Temp 97.6°F | Ht 74.0 in | Wt 229.3 lb

## 2017-06-22 DIAGNOSIS — Z86718 Personal history of other venous thrombosis and embolism: Secondary | ICD-10-CM

## 2017-06-22 DIAGNOSIS — Z86711 Personal history of pulmonary embolism: Secondary | ICD-10-CM

## 2017-06-22 DIAGNOSIS — M25512 Pain in left shoulder: Secondary | ICD-10-CM | POA: Diagnosis not present

## 2017-06-22 DIAGNOSIS — Z832 Family history of diseases of the blood and blood-forming organs and certain disorders involving the immune mechanism: Secondary | ICD-10-CM | POA: Diagnosis not present

## 2017-06-22 DIAGNOSIS — Z599 Problem related to housing and economic circumstances, unspecified: Secondary | ICD-10-CM | POA: Diagnosis not present

## 2017-06-22 DIAGNOSIS — Z5189 Encounter for other specified aftercare: Secondary | ICD-10-CM | POA: Diagnosis not present

## 2017-06-22 DIAGNOSIS — Z23 Encounter for immunization: Secondary | ICD-10-CM | POA: Diagnosis not present

## 2017-06-22 DIAGNOSIS — Z8781 Personal history of (healed) traumatic fracture: Secondary | ICD-10-CM

## 2017-06-22 DIAGNOSIS — I2699 Other pulmonary embolism without acute cor pulmonale: Secondary | ICD-10-CM

## 2017-06-22 DIAGNOSIS — Z7901 Long term (current) use of anticoagulants: Secondary | ICD-10-CM

## 2017-06-22 DIAGNOSIS — E1140 Type 2 diabetes mellitus with diabetic neuropathy, unspecified: Secondary | ICD-10-CM

## 2017-06-22 DIAGNOSIS — Z794 Long term (current) use of insulin: Secondary | ICD-10-CM

## 2017-06-22 DIAGNOSIS — R143 Flatulence: Secondary | ICD-10-CM

## 2017-06-22 LAB — GLUCOSE, CAPILLARY: GLUCOSE-CAPILLARY: 81 mg/dL (ref 65–99)

## 2017-06-22 MED ORDER — APIXABAN 5 MG PO TABS: 5.0000 mg | ORAL_TABLET | Freq: Two times a day (BID) | ORAL | 2 refills | Status: DC

## 2017-06-22 MED FILL — ELIQUIS 5 MG TABLET: 5 | 30 days supply | Qty: 60 | Fill #0

## 2017-06-22 NOTE — Patient Instructions (Signed)
It was a pleasure to see you today [name].  I will prescribe Eliquis for your anticoagulation and you can switch to taking this when you finish the current edoxaban prescription. This will be for 5mg  twice daily and is recommended to be taken near meals.  I do not have a great explanation for your worsened gas and malodorous stools. We will wait and see for now. Please let us know if you start to see any diarrhea or change in bowel habits.  We will plan to see you again around November for your routine diabetes follow up.

## 2017-06-22 NOTE — Progress Notes (Signed)
   CC: Prescribed anticoagulant is not affordably covered by insurance, left shoulder pain  HPI: [name] is a 56 y.o. man here for follow up after hospitalization with a pulmonary embolus. His prescribed edoxaban was not well covered by insurance and so needs an alternate long term anticoagulant. His shortness of breath with activity has resolved since his hospital discharge.  He also reports some increased left shoulder pain for the past two weeks. So far he has tried taking flexeril and using a hot pack with partial benefit in his pain. This is worst when using the left arm and when waking up after sleeping on his side. He has not noticed any radiation of the pain or arm weakness.   See problem based assessment and plan below for additional details  Past Medical History:  Diagnosis Date  . Allergic rhinitis   . Degenerative joint disease of knee   . DVT (deep venous thrombosis) (HCC) RLE X 2   Ended anticoagulation in 2012  . GERD (gastroesophageal reflux disease)   . 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 HIV infection ([place])   . Hx MRSA infection   . Hyperlipidemia   . Hypertension   . Pulmonary embolism ([place]) 05/14/2017  . Superficial thrombophlebitis   . Type II diabetes mellitus ([place]) 9/09    Review of Systems:  Review of Systems  Constitutional: Negative for malaise/fatigue.  Eyes: Negative for blurred vision.  Respiratory: Negative for shortness of breath.   Cardiovascular: Negative for chest pain and leg swelling.  Gastrointestinal: Negative for abdominal pain, blood in stool, constipation, diarrhea and melena.       Flatulence  Skin: Negative for rash.  Neurological: Negative for weakness.  Endo/Heme/Allergies: Does not bruise/bleed easily.    Physical Exam: Physical Exam  Constitutional: He is well-developed, well-nourished, and in no distress.  Cardiovascular: Normal rate and regular rhythm.   Pulmonary/Chest: Effort normal and breath  sounds normal.  Abdominal: Soft. Bowel sounds are normal. There is no tenderness.  Musculoskeletal: He exhibits no edema.  Pain is reproduced in the left shoulder by abduction against resistance and empty can test, no weakness, no radiation of pain  Skin: Skin is warm and dry. No pallor.  Psychiatric: Mood and affect normal.    Vitals:   06/22/17 1418  BP: 135/83  Pulse: (!) 43  Temp: 97.6 F (36.4 C)  TempSrc: Oral  SpO2: 98%  Weight: 229 lb 4.8 oz (104 kg)  Height: 6\' 2"  (1.88 m)    Assessment & Plan:   See Encounters Tab for problem based charting.  Patient discussed with Dr. Evette Doffing

## 2017-06-22 NOTE — Progress Notes (Deleted)
   CC: ***  HPI:  [name] is a 56 y.o.    See problem based assessment and plan below for additional details  Past Medical History:  Diagnosis Date  . Allergic rhinitis   . Degenerative joint disease of knee   . DVT (deep venous thrombosis) (HCC) RLE X 2   Ended anticoagulation in 2012  . GERD (gastroesophageal reflux disease)   . 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 HIV infection ([place])   . Hx MRSA infection   . Hyperlipidemia   . Hypertension   . Pulmonary embolism ([place]) 05/14/2017  . Superficial thrombophlebitis   . Type II diabetes mellitus ([place]) 9/09    Review of Systems:  ROS  Physical Exam: Physical Exam  There were no vitals filed for this visit. ***  Assessment & Plan:   See Encounters Tab for problem based charting.  Patient {GC/GE:3044014::"discussed with","seen with"} Dr. {NAMES:[id number]::"Butcher","Granfortuna","E. Hoffman","Klima","Mullen","Narendra","Vincent"}

## 2017-06-25 DIAGNOSIS — M25512 Pain in left shoulder: Secondary | ICD-10-CM | POA: Insufficient documentation

## 2017-06-25 NOTE — Assessment & Plan Note (Addendum)
HPI: Trevor Fischer was hospitalized 8/13 for an acute submassive PE for which he was started on treatment with edoxaban. His primary symptom at the time was progressive dyspnea on exertion which has almost completely resolved since leaving the hospital. He has had trouble affording the medication because it is limited coverage under his insurance. This was an unprovoked event and is his second event due to a past LEDVT which was reported after leg fracture and immobilization. He also has a family history of blood clots on his mother's side.  A: Follow up for acute PE. No specific mechanism of hypercoagulability was identified but he qualifies for lifelong anticoagulation at this time. There is limited data on selection of DOAC without a specific mechanism. There is no need for additional imaging studies at this time.  P: Start eliquis 5mg  BID for indefinite anticoagulation

## 2017-06-25 NOTE — Assessment & Plan Note (Signed)
HPI: He has new left shoulder pain for the past 1-2 weeks. This bothers him most at night when sleeping on his side. The pain does not radiate and there is no associated weakness. He does not recall any specific onset or change in activity. He is overall physically active and does yardwork and house maintenance, as well as nursing work full time.  A: Left shoulder pain is consistent with supraspinatus tendinopathy on examination. There is no weakness and the pain is not severe enough to impair normal work and home activities.  P: Observe for now If it worsens at follow up we can consider intraarticular injection for relief NSAIDs PRN

## 2017-06-25 NOTE — Assessment & Plan Note (Signed)
HPI: He has a complaint of ongoing flatulence and foul smelling stools that is worse in the past 6 months. He does not have any associated melena, diarrhea, straining, abdominal pain, or changes in his diet that he has noticed. There is no specific medication he can remember changing at the time of these symptoms. He has taken simethicone for flatulence in the past with partial benefit but is not helping his stools.  A: The cause of this is unclear and may be dietary. He has no concerning features for a dangerous process so we can observe at this time.

## 2017-06-25 NOTE — Progress Notes (Signed)
Internal Medicine Clinic Attending  Case discussed with Dr. Rice at the time of the visit.  We reviewed the resident's history and exam and pertinent patient test results.  I agree with the assessment, diagnosis, and plan of care documented in the resident's note.  

## 2017-06-25 NOTE — Assessment & Plan Note (Signed)
Influenza vaccine administered today.

## 2017-07-05 MED FILL — ATORVASTATIN 40 MG TABLET: 40 | 90 days supply | Qty: 90 | Fill #2

## 2017-07-06 MED FILL — LANTUS SOLOSTAR 100 UNITS/M: 100 | 90 days supply | Qty: 21 | Fill #4

## 2017-07-10 MED FILL — CYCLOBENZAPRINE 10 MG TAB: 10 | 30 days supply | Qty: 90 | Fill #2

## 2017-07-10 MED FILL — SIMETHICONE 180 MG SOFTGEL: 180 | 90 days supply | Qty: 270 | Fill #2

## 2017-07-16 MED FILL — TIVICAY 50 MG TABLET: 50 | 30 days supply | Qty: 30 | Fill #10

## 2017-07-16 MED FILL — SM ALCOHOL 70% PREP PADS: 70 | 90 days supply | Qty: 400 | Fill #3

## 2017-07-16 MED FILL — UNIFINE PENTIPS 8MM 31G: 31G X 8 MM | 33 days supply | Qty: 200 | Fill #4

## 2017-07-16 MED FILL — HUMALOG 100 UNITS/ML KWIKPE: 100 | 38 days supply | Qty: 15 | Fill #5

## 2017-07-16 MED FILL — DESCOVY 200-25 MG TABS: 200-25 | 30 days supply | Qty: 30 | Fill #10

## 2017-07-27 ENCOUNTER — Encounter: Payer: 59 | Admitting: Internal Medicine

## 2017-07-30 MED FILL — GABAPENTIN 600 MG TABLET: 600 | 90 days supply | Qty: 270 | Fill #1

## 2017-08-03 ENCOUNTER — Ambulatory Visit (INDEPENDENT_AMBULATORY_CARE_PROVIDER_SITE_OTHER): Payer: 59 | Admitting: Internal Medicine

## 2017-08-03 ENCOUNTER — Encounter: Payer: Self-pay | Admitting: Internal Medicine

## 2017-08-03 VITALS — BP 144/82 | HR 76 | Temp 98.5°F | Ht 74.0 in | Wt 234.7 lb

## 2017-08-03 DIAGNOSIS — M545 Low back pain: Secondary | ICD-10-CM

## 2017-08-03 DIAGNOSIS — E1140 Type 2 diabetes mellitus with diabetic neuropathy, unspecified: Secondary | ICD-10-CM | POA: Diagnosis not present

## 2017-08-03 DIAGNOSIS — Z7901 Long term (current) use of anticoagulants: Secondary | ICD-10-CM | POA: Diagnosis not present

## 2017-08-03 DIAGNOSIS — Z794 Long term (current) use of insulin: Secondary | ICD-10-CM

## 2017-08-03 DIAGNOSIS — Z86711 Personal history of pulmonary embolism: Secondary | ICD-10-CM

## 2017-08-03 DIAGNOSIS — L03031 Cellulitis of right toe: Secondary | ICD-10-CM | POA: Diagnosis not present

## 2017-08-03 DIAGNOSIS — G8929 Other chronic pain: Secondary | ICD-10-CM

## 2017-08-03 DIAGNOSIS — M5441 Lumbago with sciatica, right side: Secondary | ICD-10-CM

## 2017-08-03 LAB — POCT GLYCOSYLATED HEMOGLOBIN (HGB A1C): Hemoglobin A1C: 7.7

## 2017-08-03 LAB — GLUCOSE, CAPILLARY: Glucose-Capillary: 160 mg/dL — ABNORMAL HIGH (ref 65–99)

## 2017-08-03 MED ORDER — BACLOFEN 10 MG PO TABS: 10.0000 mg | ORAL_TABLET | Freq: Three times a day (TID) | ORAL | 0 refills | Status: DC | PRN

## 2017-08-03 MED ORDER — APIXABAN 5 MG PO TABS: 5.0000 mg | ORAL_TABLET | Freq: Two times a day (BID) | ORAL | 5 refills | Status: DC

## 2017-08-03 MED FILL — ELIQUIS 5 MG TABLET: 5 | 30 days supply | Qty: 60 | Fill #0

## 2017-08-03 MED FILL — BACLOFEN 10 MG TABLET: 10 | 30 days supply | Qty: 90 | Fill #0

## 2017-08-03 NOTE — Patient Instructions (Signed)
It was a pleasure to see you today [name].  You have an infection under the toenail known as Paronychia. For this I recommend expressing some of the pus with warm compress and protecting this with a bandaid/neosporin in the meantime. These wounds most often recover on their own but your is prolonged and higher risk due to diabetes and neuropathy.  We are scheduling you for podiatry visit on Monday who may definitively treat this with surgery on the toenail.  I sent a new prescription for Baclofen 10mg  up to 3 times daily as needed for muscle spasms. This should be taken in place of Flexeril not at the same time. If this helps but causes too much drowsiness we can try a lower dose. If it does nothing we can try a higher dose. You can call and let us know how it is doing if you run out or need to change before the next visit.  Your diabetes is going the right direction. Keep working on watching your diet and taking your medicines as you have been. I do not think we need to increases medicines today.

## 2017-08-03 NOTE — Progress Notes (Signed)
CC: Right 3rd toe paronychia after blunt injury  HPI:  [name] is a 56 y.o. male with PMHx detailed below presenting with infection in his right third toe after it was run over by a wheelchair earlier this week.  See problem based assessment and plan below for additional details.  Acute paronychia of toe of right foot HPI: He backed a wheelchair with patient onboard over his right foot approximately 2 weeks ago with mild associated pain. Since then he has noticed ongoing swelling of the right 3rd toe. The toenail feels loose in position and he has noticed pus expressed from under the nail when he applies pressure this week. So far he has tried soaking it in epsom salts, and wearing padding around his toe when working to protect this. He does have diabetic neuropathy with partially decreased sensation in the area. A: Acute paronychia after trauma to R 3rd toe. It has been present a while and is able to drain with external pressure, however given the underlying diabetes and neuropathy I think podiatry should assess for further drainage needed. P: He will continue supportive care with compress, soaking, protection over the weekend Appointment made with [facility] on Monday 11/5 @330pm   Diabetes mellitus with neuropathy ([place]) HPI: He is doing better since his last visit with no complaints of symptomatic hypoglycemia. He thinks his diet has been better than before. His glucometer report is often at goal although still suffered irregularities mostly attributed to shift work. POCT Hgb A1c checked today is 7.7% down from 8.0% at his last visit. A: T2DM with associated neuropathy, slightly above goal A1c 7.0%. Considering his improving numbers, less hypoglycemia, and working on his diet I think it is best to not start new medication changes today P: No changes today Needs routine lab monitoring at follow up If he still has no hypoglycemia events but remains above goal can  titrate meds at follow up  Back pain HPI: He continues to have low back pain worst from spasms at night. It bothers him to a lesser extent when awake and working. He thinks the flexeril used to help but no longer notices any benefit. He is not drowsy with taking this medicine TID. A: Chronic low back pain, without radicular symptoms P: Discontinue flexeril Prescription sent for baclofen 10mg  TID PRN  Personal history of PE (pulmonary embolism) He is doing well with Eliquis 5mg  BID. He denies any trouble taking the medicine or any problems with bleeding or bruising. He needs a refill order placed. A: Indefinite anticoagulationafter unprovoked PE, history of past DVT P: Reordered Eliquis 5mg  BID x6 months    Past Medical History:  Diagnosis Date  . Allergic rhinitis   . Degenerative joint disease of knee   . DVT (deep venous thrombosis) (HCC) RLE X 2   Ended anticoagulation in 2012  . GERD (gastroesophageal reflux disease)   . 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 HIV infection ([place])   . Hx MRSA infection   . Hyperlipidemia   . Hypertension   . Pulmonary embolism ([place]) 05/14/2017  . Superficial thrombophlebitis   . Type II diabetes mellitus ([place]) 9/09    Review of Systems: Review of Systems  Constitutional: Negative for chills and fever.  Cardiovascular: Positive for leg swelling.  Skin: Negative for rash.  Neurological: Negative for focal weakness.  Endo/Heme/Allergies: Does not bruise/bleed easily.     Physical Exam: Vitals:   08/03/17 1417  BP: (!) 144/82  Pulse:  76  Temp: 98.5 F (36.9 C)  TempSrc: Oral  SpO2: 98%  Weight: 234 lb 11.2 oz (106.5 kg)  Height: 6\' 2"  (1.88 m)   GENERAL- alert, co-operative, NAD HEENT- Atraumatic, PERRL, oral mucosa appears moist, good and intact dentition CARDIAC- RRR, no murmurs, rubs or gallops. RESP- CTAB, no wheezes or crackles. NEURO- No obvious Cr N abnormality, strength upper and lower extremities- 5/5,  Sensation intact globally EXTREMITIES- Tracel right ankle edema, right 3rd toe with purulent drainage expressable under toenail SKIN- Warm, dry, No rash or lesion. PSYCH- Normal mood and affect, appropriate thought content and speech.    Assessment & Plan:   See encounters tab for problem based medical decision making.   Patient discussed with Dr. Rebeca Alert

## 2017-08-04 NOTE — Assessment & Plan Note (Signed)
HPI: He is doing better since his last visit with no complaints of symptomatic hypoglycemia. He thinks his diet has been better than before. His glucometer report is often at goal although still suffered irregularities mostly attributed to shift work. POCT Hgb A1c checked today is 7.7% down from 8.0% at his last visit. A: T2DM with associated neuropathy, slightly above goal A1c 7.0%. Considering his improving numbers, less hypoglycemia, and working on his diet I think it is best to not start new medication changes today P: No changes today Needs routine lab monitoring at follow up If he still has no hypoglycemia events but remains above goal can titrate meds at follow up

## 2017-08-04 NOTE — Assessment & Plan Note (Signed)
HPI: He backed a wheelchair with patient onboard over his right foot approximately 2 weeks ago with mild associated pain. Since then he has noticed ongoing swelling of the right 3rd toe. The toenail feels loose in position and he has noticed pus expressed from under the nail when he applies pressure this week. So far he has tried soaking it in epsom salts, and wearing padding around his toe when working to protect this. He does have diabetic neuropathy with partially decreased sensation in the area. A: Acute paronychia after trauma to R 3rd toe. It has been present a while and is able to drain with external pressure, however given the underlying diabetes and neuropathy I think podiatry should assess for further drainage needed. P: He will continue supportive care with compress, soaking, protection over the weekend Appointment made with [facility] on Monday 11/5 @330pm 

## 2017-08-04 NOTE — Assessment & Plan Note (Signed)
He is doing well with Eliquis 5mg  BID. He denies any trouble taking the medicine or any problems with bleeding or bruising. He needs a refill order placed. A: Indefinite anticoagulationafter unprovoked PE, history of past DVT P: Reordered Eliquis 5mg  BID x6 months

## 2017-08-04 NOTE — Assessment & Plan Note (Signed)
HPI: He continues to have low back pain worst from spasms at night. It bothers him to a lesser extent when awake and working. He thinks the flexeril used to help but no longer notices any benefit. He is not drowsy with taking this medicine TID. A: Chronic low back pain, without radicular symptoms P: Discontinue flexeril Prescription sent for baclofen 10mg  TID PRN

## 2017-08-06 ENCOUNTER — Ambulatory Visit: Payer: 59 | Admitting: Podiatry

## 2017-08-06 ENCOUNTER — Ambulatory Visit (INDEPENDENT_AMBULATORY_CARE_PROVIDER_SITE_OTHER): Payer: 59

## 2017-08-06 ENCOUNTER — Encounter: Payer: Self-pay | Admitting: Podiatry

## 2017-08-06 VITALS — BP 121/78 | HR 80 | Resp 18

## 2017-08-06 DIAGNOSIS — T1490XA Injury, unspecified, initial encounter: Secondary | ICD-10-CM | POA: Diagnosis not present

## 2017-08-06 DIAGNOSIS — I739 Peripheral vascular disease, unspecified: Secondary | ICD-10-CM

## 2017-08-06 MED ORDER — DOXYCYCLINE HYCLATE 100 MG PO TABS: 100.0000 mg | ORAL_TABLET | Freq: Two times a day (BID) | ORAL | 0 refills | Status: DC

## 2017-08-06 MED ORDER — SACCHAROMYCES BOULARDII 250 MG PO CAPS: 250.0000 mg | ORAL_CAPSULE | Freq: Two times a day (BID) | ORAL | 0 refills | Status: DC

## 2017-08-06 MED FILL — FLORASTOR 250 MG CAPSULE: 250 | 10 days supply | Qty: 20 | Fill #0

## 2017-08-06 MED FILL — DOXYCYCLINE HYCLATE 100 MG: 100 | 10 days supply | Qty: 20 | Fill #0

## 2017-08-06 NOTE — Progress Notes (Signed)
Internal Medicine Clinic Attending  Case discussed with Dr. Rice  at the time of the visit.  We reviewed the resident's history and exam and pertinent patient test results.  I agree with the assessment, diagnosis, and plan of care documented in the resident's note.  Alexander N Raines, MD   

## 2017-08-06 NOTE — Patient Instructions (Signed)

## 2017-08-08 MED FILL — LOSARTAN POTASSIUM 50 MG TA: 50 | 90 days supply | Qty: 90 | Fill #2

## 2017-08-08 MED FILL — PANTOPRAZOLE SOD DR 40 MG T: 40 | 60 days supply | Qty: 60 | Fill #2

## 2017-08-08 NOTE — Progress Notes (Signed)
Subjective:    Patient ID: Trevor Fischer, male   DOB: 56 y.o.   MRN: [id number]   HPI Trevor Fischer presents to the office today with his husband for concerns of his right third toe drainage coming from the nail after he got run over by a wheelchair about 3 weeks ago.  He saw his primary care physician and there is pus coming from the area.  He denies any recent antibiotics.  He states the pain has improved but the nail is loose and he has noticed some drainage still just adjacent to the nail.  He is diabetic and his last A1c was 7.7.  He has no other concerns today.   Review of Systems  All other systems reviewed and are negative.  Past Medical History:  Diagnosis Date  . Allergic rhinitis   . Degenerative joint disease of knee   . DVT (deep venous thrombosis) (HCC) RLE X 2   Ended anticoagulation in 2012  . GERD (gastroesophageal reflux disease)   . 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 HIV infection ([place])   . Hx MRSA infection   . Hyperlipidemia   . Hypertension   . Pulmonary embolism ([place]) 05/14/2017  . Superficial thrombophlebitis   . Type II diabetes mellitus ([place]) 9/09    Past Surgical History:  Procedure Laterality Date  . ENDOVENOUS ABLATION SAPHENOUS VEIN W/ LASER  07-17-2011 LEFT GRERATER SAPHENOUS VEIN AND STAB PHLEBECTOMIES   10-20   LEFT LEG  . VEIN LIGATION AND STRIPPING Bilateral      Current Outpatient Medications:  .  ACCU-CHEK FASTCLIX LANCETS MISC, USE TO CHECK BLOOD SUGAR UP TO 4 TIMES A DAY, Disp: , Rfl: 99 .  ACCU-CHEK GUIDE test strip, USE TO CHECK BLOOD SUGAR UP TO 4 TIMES A DAY, Disp: , Rfl: 99 .  Alcohol Swabs (SM ALCOHOL PREP) 70 % PADS, USE 4 TIMES DAILY AS NEEDED, Disp: , Rfl: 99 .  apixaban (ELIQUIS) 5 MG TABS tablet, Take 1 tablet (5 mg total) by mouth 2 (two) times daily., Disp: 60 tablet, Rfl: 5 .  ASPIR-LOW 81 MG EC tablet, TAKE 1 TABLET BY MOUTH DAILY. (Patient taking differently: TAKE 1 TABLET (81MG ) BY MOUTH DAILY.), Disp: 100  tablet, Rfl: 3 .  atorvastatin (LIPITOR) 40 MG tablet, Take 1 tablet (40 mg total) by mouth daily., Disp: 90 tablet, Rfl: 3 .  baclofen (LIORESAL) 10 MG tablet, Take 1 tablet (10 mg total) by mouth 3 (three) times daily as needed for muscle spasms., Disp: 90 tablet, Rfl: 0 .  chlorpheniramine (CHLOR-TRIMETON) 4 MG tablet, Take 1 tablet (4 mg total) by mouth every 4 (four) hours as needed for allergies., Disp: 150 tablet, Rfl: 1 .  DESCOVY 200-25 MG tablet, TAKE 1 TABLET BY MOUTH DAILY., Disp: 30 tablet, Rfl: PRN .  doxycycline (VIBRA-TABS) 100 MG tablet, Take 1 tablet (100 mg total) 2 (two) times daily by mouth., Disp: 20 tablet, Rfl: 0 .  gabapentin (NEURONTIN) 600 MG tablet, TAKE 1 TABLET BY MOUTH 3 TIMES DAILY. (Patient taking differently: TAKE 1-2 TABLETS (600mg -1200mg ) BY MOUTH 3 TIMES DAILY.), Disp: 270 tablet, Rfl: 1 .  Insulin Glargine (LANTUS SOLOSTAR) 100 UNIT/ML Solostar Pen, Inject 30 Units into the skin daily at 10 pm. (Patient taking differently: Inject 30 Units into the skin every morning. ), Disp: 45 mL, Rfl: 3 .  insulin lispro (HUMALOG KWIKPEN) 100 UNIT/ML KiwkPen, Inject 0.1 mLs (10 Units total) into the skin 4 (four) times daily. Type 2  diabetes with long term use of insulin #11.42, Disp: 15 mL, Rfl: 5 .  liraglutide (VICTOZA) 18 MG/3ML SOPN, Inject 0.3 mLs (1.8 mg total) into the skin daily. E11.65 (Patient taking differently: Inject 1.2 mg into the skin daily. E11.65), Disp: 6 mL, Rfl: 8 .  losartan (COZAAR) 50 MG tablet, TAKE 1 TABLET BY MOUTH DAILY. (Patient taking differently: TAKE 1 TABLET (50MG ) BY MOUTH DAILY.), Disp: 180 tablet, Rfl: 3 .  Multiple Vitamins-Minerals (MULTIVITAMIN WITH MINERALS) tablet, Take 1 tablet by mouth daily., Disp: , Rfl:  .  Omega-3 Fatty Acids (FISH OIL) 1000 MG CAPS, Take 1 capsule by mouth 2 (two) times daily. , Disp: , Rfl:  .  pantoprazole (PROTONIX) 40 MG tablet, TAKE 1 TABLET BY MOUTH DAILY. (Patient taking differently: TAKE 1 TABLET (40MG ) BY  MOUTH DAILY.), Disp: 60 tablet, Rfl: 3 .  saccharomyces boulardii (FLORASTOR) 250 MG capsule, Take 1 capsule (250 mg total) 2 (two) times daily by mouth., Disp: 28 capsule, Rfl: 0 .  sildenafil (VIAGRA) 50 MG tablet, Take 1 tablet (50 mg total) by mouth daily as needed for erectile dysfunction., Disp: 10 tablet, Rfl: 2 .  Simethicone 180 MG CAPS, Take 1 capsule (180 mg total) by mouth 3 (three) times daily., Disp: 180 capsule, Rfl: 3 .  TIVICAY 50 MG tablet, TAKE 1 TABLET BY MOUTH DAILY. (Patient taking differently: TAKE 1 TABLET (50mg ) BY MOUTH DAILY.), Disp: 30 tablet, Rfl: PRN .  triamterene-hydrochlorothiazide (MAXZIDE) 75-50 MG tablet, TAKE 1 TABLET BY MOUTH ONCE DAILY (Patient taking differently: TAKE 1/2 TABLET BY MOUTH ONCE DAILY), Disp: 45 tablet, Rfl: PRN .  UNIFINE PENTIPS 31G X 8 MM MISC, USE TO INJECT INSULIN 5 TIMES DAILY PLUS LIRAGLUTIDE ONCE DAILY, Disp: , Rfl: 11 .  verapamil (CALAN-SR) 240 MG CR tablet, Take 1 tablet (240 mg total) by mouth daily., Disp: 90 tablet, Rfl: 2  Allergies  Allergen Reactions  . Amoxicillin-Pot Clavulanate Rash    Social History   Socioeconomic History  . Marital status: Married    Spouse name: Not on file  . Number of children: Not on file  . Years of education: Not on file  . Highest education level: Not on file  Social Needs  . Financial resource strain: Not on file  . Food insecurity - worry: Not on file  . Food insecurity - inability: Not on file  . Transportation needs - medical: Not on file  . Transportation needs - non-medical: Not on file  Occupational History  . Not on file  Tobacco Use  . Smoking status: Former Smoker    Packs/day: 1.00    Years: 28.00    Pack years: 28.00    Types: Cigarettes    Last attempt to quit: 07/02/2005    Years since quitting: 12.1  . Smokeless tobacco: Never Used  Substance and Sexual Activity  . Alcohol use: Yes    Alcohol/week: 0.0 oz    Comment: 05/14/2017 "1-2 drinks/year"  . Drug use: No   . Sexual activity: Not Currently    Partners: Male    Comment: declined condoms  Other Topics Concern  . Not on file  Social History Narrative   Lives in [place], with partner of 46 years    Works as Chartered certified accountant at [employer]   Has [employer]         Objective:  Physical Exam General: AAO x3, NAD  Dermatological: The right third digit toenail is loose from the underlying nail bed on  the proximal aspect but appears to be adhered along the distal aspect.  There is a superficial laceration present just proximal to the nailbed and the skin is loose and there is clear drainage coming from the area but there is no pus.  There is faint edema but there is no erythema, extending cellulitis.  There is no fluctuation or crepitation.  There is no malodor.  Has no other open lesions or pre-ulcerative lesions identified today.  Vascular: DP pulses 2/4, PT pulse 1/4.  CRT less than 3 seconds.  There is no pain with calf compression, swelling, warmth, erythema.   Neruologic: Sensation mildly decreased with Ernestina Patches monofilament  Musculoskeletal: Tenderness to the right third digit toenail on the distal phalanx.  There is no other area of tenderness identified today.  Ankle, subtalar joint range of motion intact without any pain or restriction.  Achilles tendon, plantar fascia, extensor tendons intact.  Gait: Unassisted, Nonantalgic.      Assessment:     Right third digit toenail injury with laceration    Plan:     -Treatment options discussed including all alternatives, risks, and complications -Etiology of symptoms were discussed -X-rays were obtained and reviewed with the patient.  On the AP view there is a radiolucency present in the distal phalanx.  There is no soft tissue emphysema present. -I did an ABI in the office today which was normal. -I recommended a total nail avulsion at this time to the right third toenail given the losing the nail as well as the drainage  and x-ray findings.  Discussed with him the risks and consequences of the procedure.  Written consent was obtained.  Under standard conditions a mixture of lidocaine and Marcaine plain was infiltrated in a digital block fashion.  Once anesthetized the skin was prepped in sterile fashion.  Tourniquet was applied.  The right third digit toenail was then removed in toto and the surrounding skin was also debrided as it was loose from the laceration.  After debridement there was no deep laceration identified.  During the procedure only a small amount of clear drainage was expressed but there is no purulence.  The area was debrided and cleaned.  Hemostasis was achieved.  Silvadene was applied followed by a bandage.  Post procedure instructions were discussed.  He tolerated a spelled and complications. -Prescribed doxycycline -Surgical shoe -Monitor for any clinical signs or symptoms of infection and directed to call the office immediately should any occur or go to the ER. -Follow-up in 1 week or sooner if needed  Trula Slade DPM

## 2017-08-10 ENCOUNTER — Encounter: Payer: Self-pay | Admitting: Podiatry

## 2017-08-14 ENCOUNTER — Other Ambulatory Visit: Payer: Self-pay | Admitting: Internal Medicine

## 2017-08-14 DIAGNOSIS — E109 Type 1 diabetes mellitus without complications: Secondary | ICD-10-CM | POA: Diagnosis not present

## 2017-08-14 DIAGNOSIS — E1140 Type 2 diabetes mellitus with diabetic neuropathy, unspecified: Secondary | ICD-10-CM

## 2017-08-14 DIAGNOSIS — Z794 Long term (current) use of insulin: Principal | ICD-10-CM

## 2017-08-14 LAB — HM DIABETES EYE EXAM

## 2017-08-14 MED FILL — DESCOVY 200-25 MG TABS: 200-25 | 30 days supply | Qty: 30 | Fill #11

## 2017-08-14 MED FILL — SILDENAFIL CITRATE 50 MG TA: 50 | 30 days supply | Qty: 6 | Fill #3

## 2017-08-14 MED FILL — ACCU-CHEK GUIDE TEST STRIP: 75 days supply | Qty: 300 | Fill #6

## 2017-08-14 MED FILL — TIVICAY 50 MG TABLET: 50 | 30 days supply | Qty: 30 | Fill #11

## 2017-08-14 MED FILL — UNIFINE PENTIPS 8MM 31G: 31G X 8 MM | 33 days supply | Qty: 200 | Fill #5

## 2017-08-15 LAB — HM DIABETES EYE EXAM

## 2017-08-15 MED FILL — HUMALOG 100 UNITS/ML KWIKPE: 100 | 38 days supply | Qty: 15 | Fill #0

## 2017-08-16 ENCOUNTER — Ambulatory Visit (INDEPENDENT_AMBULATORY_CARE_PROVIDER_SITE_OTHER): Payer: 59 | Admitting: Podiatry

## 2017-08-16 ENCOUNTER — Encounter: Payer: Self-pay | Admitting: Podiatry

## 2017-08-16 DIAGNOSIS — Z9889 Other specified postprocedural states: Secondary | ICD-10-CM

## 2017-08-16 NOTE — Patient Instructions (Signed)

## 2017-08-19 NOTE — Progress Notes (Signed)
Subjective: Trevor Fischer is a 56 y.o. male returns to office today for follow up evaluation after having right third digit total nail avulsion performed. Patient has been soaking using Epsom salts and applying topical antibiotic covered with bandaid daily.  He has not noticed any drainage or pus or any swelling or redness.  Overall he states the area is healing well.  Patient denies fevers, chills, nausea, vomiting. Denies any calf pain, chest pain, SOB.   Objective:  Vitals: Reviewed  General: Well developed, nourished, in no acute distress, alert and oriented x3   Dermatology: Skin is warm, dry and supple bilateral.  Right third digit nailbed appears to be clean, dry, with mild granular tissue and surrounding scab. There is no surrounding erythema, edema, drainage/purulence. The remaining nails appear unremarkable at this time. There are no other lesions or other signs of infection present.  Neurovascular status: Intact. No lower extremity swelling; No pain with calf compression bilateral.  Musculoskeletal: No tenderness to palpation of the right third digit nailbed. Muscular strength within normal limits bilateral.   Assesement and Plan: S/p total nail avulsion, doing well.   -Continue soaking in epsom salts twice a day followed by antibiotic ointment and a band-aid. Can leave uncovered at night. Continue this until completely healed.  -Finish course of antibiotics -Follow-up in 2 weeks for repeat x-rays of the right foot or sooner if any issues arise. -Monitor for any signs/symptoms of infection. Call the office immediately if any occur or go directly to the emergency room. Call with any questions/concerns.  Celesta Gentile, DPM

## 2017-08-21 ENCOUNTER — Encounter: Payer: Self-pay | Admitting: *Deleted

## 2017-08-30 ENCOUNTER — Ambulatory Visit (INDEPENDENT_AMBULATORY_CARE_PROVIDER_SITE_OTHER): Payer: 59

## 2017-08-30 ENCOUNTER — Ambulatory Visit: Payer: 59 | Admitting: Podiatry

## 2017-08-30 ENCOUNTER — Encounter: Payer: Self-pay | Admitting: Podiatry

## 2017-08-30 DIAGNOSIS — S92535D Nondisplaced fracture of distal phalanx of left lesser toe(s), subsequent encounter for fracture with routine healing: Secondary | ICD-10-CM

## 2017-08-30 DIAGNOSIS — T1490XA Injury, unspecified, initial encounter: Secondary | ICD-10-CM | POA: Diagnosis not present

## 2017-08-31 NOTE — Progress Notes (Signed)
Subjective: Akio presents to the office today for follow-up evaluation of right third toe fracture,  total nail avulsion.  He states he is doing well and said no pain.  He has been wearing a regular shoe without any issues.  He states the procedure site is healed he has not noticed any drainage or pus or any swelling or redness.  He denies any open sores.  He is also been wearing inserts inside of his shoe since last appointment which is been helping while at work.  He has no other concerns today he has no changes since last appointment. Denies any systemic complaints such as fevers, chills, nausea, vomiting. No acute changes since last appointment, and no other complaints at this time.   Objective: AAO x3, NAD DP/PT pulses palpable bilaterally, CRT less than 3 seconds Procedure site to the right third toe status post total nail avulsion appears to be healed.  Calluses overlying the area and there is no drainage or pus or any edema, erythema to the toe.  There is no tenderness to palpation of the toe.  Toe is in rectus position. No open lesions or pre-ulcerative lesions.  No pain with calf compression, swelling, warmth, erythema  Assessment: Healing fracture of distal phalanx right third toe with healed procedure site  Plan: -All treatment options discussed with the patient including all alternatives, risks, complications.  -X-rays were obtained and reviewed.  On the oblique view there is still a transverse line along the distal phalanx however it appears to be healing. -Procedure site appears to be healed.  He is able to wear a regular shoe without any issues.  Continue current regular shoe.  Mild sprain signs or symptoms of infection there is any pain to call the office.  Otherwise I will see him back as needed.  Is doing well. -Patient encouraged to call the office with any questions, concerns, change in symptoms.   Trula Slade DPM

## 2017-09-03 ENCOUNTER — Other Ambulatory Visit: Payer: 59

## 2017-09-11 ENCOUNTER — Other Ambulatory Visit: Payer: Self-pay | Admitting: Internal Medicine

## 2017-09-11 DIAGNOSIS — B20 Human immunodeficiency virus [HIV] disease: Secondary | ICD-10-CM

## 2017-09-11 MED FILL — TRIAMTERENE/HCTZ 75/50 TAB: 75-50 | 90 days supply | Qty: 90 | Fill #2

## 2017-09-11 MED FILL — TIVICAY 50 MG TABLET: 50 | 30 days supply | Qty: 30 | Fill #0

## 2017-09-11 MED FILL — ELIQUIS 5 MG TABLET: 5 | 30 days supply | Qty: 60 | Fill #1

## 2017-09-11 MED FILL — DESCOVY 200-25 MG TABS: 200-25 | 30 days supply | Qty: 30 | Fill #0

## 2017-09-11 MED FILL — UNIFINE PENTIPS 8MM 31G: 31G X 8 MM | 33 days supply | Qty: 200 | Fill #6

## 2017-09-11 MED FILL — VERAPAMIL ER 240 MG TABLET: 240 | 90 days supply | Qty: 90 | Fill #1

## 2017-09-11 MED FILL — VICTOZA 2-PAK 18 MG/3 ML PE: 18 | 90 days supply | Qty: 18 | Fill #4

## 2017-09-14 MED FILL — HUMALOG 100 UNITS/ML KWIKPE: 100 | 38 days supply | Qty: 15 | Fill #1

## 2017-09-17 MED FILL — LANTUS SOLOSTAR 100 UNITS/M: 100 | 90 days supply | Qty: 21 | Fill #0

## 2017-09-21 ENCOUNTER — Encounter: Payer: Self-pay | Admitting: *Deleted

## 2017-09-21 ENCOUNTER — Other Ambulatory Visit: Payer: 59

## 2017-09-21 ENCOUNTER — Other Ambulatory Visit (HOSPITAL_COMMUNITY)
Admission: RE | Admit: 2017-09-21 | Discharge: 2017-09-21 | Disposition: A | Discharge status: Home / Self Care | Payer: 59 | Source: Ambulatory Visit | Attending: Infectious Disease | Admitting: Infectious Disease

## 2017-09-21 DIAGNOSIS — B20 Human immunodeficiency virus [HIV] disease: Secondary | ICD-10-CM | POA: Insufficient documentation

## 2017-09-21 LAB — T-HELPER CELL (CD4) - (RCID CLINIC ONLY)
CD4 % Helper T Cell: 31 % — ABNORMAL LOW (ref 33–55)
CD4 T CELL ABS: 650 /uL (ref 400–2700)

## 2017-09-23 ENCOUNTER — Encounter (HOSPITAL_COMMUNITY): Payer: Self-pay | Admitting: Emergency Medicine

## 2017-09-23 DIAGNOSIS — Y990 Civilian activity done for income or pay: Secondary | ICD-10-CM | POA: Diagnosis not present

## 2017-09-23 DIAGNOSIS — B20 Human immunodeficiency virus [HIV] disease: Secondary | ICD-10-CM | POA: Diagnosis not present

## 2017-09-23 DIAGNOSIS — X500XXA Overexertion from strenuous movement or load, initial encounter: Secondary | ICD-10-CM | POA: Insufficient documentation

## 2017-09-23 DIAGNOSIS — E119 Type 2 diabetes mellitus without complications: Secondary | ICD-10-CM | POA: Diagnosis not present

## 2017-09-23 DIAGNOSIS — Z86711 Personal history of pulmonary embolism: Secondary | ICD-10-CM | POA: Insufficient documentation

## 2017-09-23 DIAGNOSIS — M5441 Lumbago with sciatica, right side: Secondary | ICD-10-CM | POA: Diagnosis not present

## 2017-09-23 DIAGNOSIS — Z794 Long term (current) use of insulin: Secondary | ICD-10-CM | POA: Insufficient documentation

## 2017-09-23 DIAGNOSIS — Y92230 Patient room in hospital as the place of occurrence of the external cause: Secondary | ICD-10-CM | POA: Insufficient documentation

## 2017-09-23 DIAGNOSIS — S3992XA Unspecified injury of lower back, initial encounter: Secondary | ICD-10-CM | POA: Diagnosis present

## 2017-09-23 DIAGNOSIS — M5442 Lumbago with sciatica, left side: Secondary | ICD-10-CM | POA: Insufficient documentation

## 2017-09-23 DIAGNOSIS — Z8614 Personal history of Methicillin resistant Staphylococcus aureus infection: Secondary | ICD-10-CM | POA: Insufficient documentation

## 2017-09-23 DIAGNOSIS — I10 Essential (primary) hypertension: Secondary | ICD-10-CM | POA: Insufficient documentation

## 2017-09-23 DIAGNOSIS — Z86718 Personal history of other venous thrombosis and embolism: Secondary | ICD-10-CM | POA: Insufficient documentation

## 2017-09-23 DIAGNOSIS — Y93F1 Activity, caregiving, bathing: Secondary | ICD-10-CM | POA: Diagnosis not present

## 2017-09-23 NOTE — ED Triage Notes (Signed)
Patient here from work with complaints of lower back pain radiating down into leg. 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 Ambulatory. Pain 10/10.

## 2017-09-24 ENCOUNTER — Emergency Department (HOSPITAL_COMMUNITY)
Admission: EM | Admit: 2017-09-24 | Discharge: 2017-09-24 | Disposition: A | Discharge status: Home / Self Care | Payer: PRIVATE HEALTH INSURANCE | Attending: Emergency Medicine | Admitting: Emergency Medicine

## 2017-09-24 DIAGNOSIS — M5441 Lumbago with sciatica, right side: Secondary | ICD-10-CM

## 2017-09-24 DIAGNOSIS — M5442 Lumbago with sciatica, left side: Secondary | ICD-10-CM

## 2017-09-24 LAB — CBC WITH DIFFERENTIAL/PLATELET
BASOS PCT: 1.2 %
Basophils Absolute: 61 cells/uL (ref 0–200)
EOS ABS: 143 {cells}/uL (ref 15–500)
Eosinophils Relative: 2.8 %
HCT: 42.4 % (ref 38.5–50.0)
HEMOGLOBIN: 14.2 g/dL (ref 13.2–17.1)
Lymphs Abs: 1984 cells/uL (ref 850–3900)
MCH: 27 pg (ref 27.0–33.0)
MCHC: 33.5 g/dL (ref 32.0–36.0)
MCV: 80.8 fL (ref 80.0–100.0)
MPV: 9 fL (ref 7.5–12.5)
Monocytes Relative: 9.6 %
NEUTROS ABS: 2423 {cells}/uL (ref 1500–7800)
Neutrophils Relative %: 47.5 %
Platelets: 234 10*3/uL (ref 140–400)
RBC: 5.25 10*6/uL (ref 4.20–5.80)
RDW: 13 % (ref 11.0–15.0)
TOTAL LYMPHOCYTE: 38.9 %
WBC: 5.1 10*3/uL (ref 3.8–10.8)
WBCMIX: 490 {cells}/uL (ref 200–950)

## 2017-09-24 LAB — COMPLETE METABOLIC PANEL WITH GFR
AG Ratio: 1.7 (calc) (ref 1.0–2.5)
ALKALINE PHOSPHATASE (APISO): 86 U/L (ref 40–115)
ALT: 56 U/L — AB (ref 9–46)
AST: 36 U/L — AB (ref 10–35)
Albumin: 4.4 g/dL (ref 3.6–5.1)
BUN: 19 mg/dL (ref 7–25)
CALCIUM: 9.6 mg/dL (ref 8.6–10.3)
CO2: 27 mmol/L (ref 20–32)
CREATININE: 1.16 mg/dL (ref 0.70–1.33)
Chloride: 101 mmol/L (ref 98–110)
GFR, EST NON AFRICAN AMERICAN: 69 mL/min/{1.73_m2} (ref >=60)
GFR, Est African American: 80 mL/min/{1.73_m2} (ref >=60)
GLOBULIN: 2.6 g/dL (ref 1.9–3.7)
GLUCOSE: 175 mg/dL — AB (ref 65–99)
Potassium: 4.4 mmol/L (ref 3.5–5.3)
SODIUM: 140 mmol/L (ref 135–146)
Total Bilirubin: 0.4 mg/dL (ref 0.2–1.2)
Total Protein: 7 g/dL (ref 6.1–8.1)

## 2017-09-24 LAB — LIPID PANEL
CHOL/HDL RATIO: 4.1 (calc) (ref <5.0)
Cholesterol: 151 mg/dL (ref <200)
HDL: 37 mg/dL — ABNORMAL LOW (ref >40)
LDL CHOLESTEROL (CALC): 91 mg/dL
NON-HDL CHOLESTEROL (CALC): 114 mg/dL (ref <130)
Triglycerides: 125 mg/dL (ref <150)

## 2017-09-24 LAB — URINE CYTOLOGY ANCILLARY ONLY
CHLAMYDIA, DNA PROBE: NEGATIVE
Neisseria Gonorrhea: NEGATIVE

## 2017-09-24 LAB — RPR: RPR: NONREACTIVE

## 2017-09-24 MED ORDER — CYCLOBENZAPRINE HCL 10 MG PO TABS: 10.0000 mg | ORAL_TABLET | Freq: Two times a day (BID) | ORAL | 0 refills | Status: DC | PRN

## 2017-09-24 MED ORDER — TRAMADOL HCL 50 MG PO TABS: 50.0000 mg | ORAL_TABLET | Freq: Four times a day (QID) | ORAL | 0 refills | Status: DC | PRN

## 2017-09-24 NOTE — ED Provider Notes (Signed)
[facility] DEPT Provider Note   CSN: [id number] Arrival date & time: 09/23/17  2305     History   Chief Complaint Chief Complaint  Patient presents with  . Back Pain    HPI Trevor Fischer is a 56 y.o. male.  HPI Trevor Fischer is a 56 y.o. male with history of hypertension, diabetes, HIV infection, DVT, presents to emergency department complaining of back pain.  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  He states this happened 2 days ago.  He reports mild pain at that time, but states pain worsened over the last 2 days and has become severe today.  He states pain radiates into bilateral upper thighs.  Denies pain radiating down to the lower legs.  He denies any numbness or weakness in his legs.  He reports prior history of back pain.  Denies any abdominal pain.  No trouble controlling his bowels or bladder.  No fever.  No IV drug use.  He states he came to work, however unable to perform any duties so was sent to emergency department.  He took acetaminophen earlier and gabapentin for his pain which did not help.  Past Medical History:  Diagnosis Date  . Allergic rhinitis   . Degenerative joint disease of knee   . DVT (deep venous thrombosis) (HCC) RLE X 2   Ended anticoagulation in 2012  . GERD (gastroesophageal reflux disease)   . 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 HIV infection ([place])   . Hx MRSA infection   . Hyperlipidemia   . Hypertension   . Pulmonary embolism ([place]) 05/14/2017  . Superficial thrombophlebitis   . Type II diabetes mellitus ([place]) 9/09    Patient Active Problem List   Diagnosis Date Noted  . Acute paronychia of toe of right foot 08/03/2017  . Left shoulder pain 06/25/2017  . Need for immunization against influenza 06/22/2017  . Dyspnea on exertion 05/14/2017  . Personal history of PE (pulmonary embolism) 05/14/2017  .  Erectile dysfunction 10/11/2016  . Daytime somnolence 02/20/2016  . Advanced care planning/counseling discussion 02/20/2016  . Cough 09/14/2015  . Back pain 07/26/2015  . Flatulence 05/13/2015  . 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 Trigger thumb of right hand 05/08/2012  . Varicose veins of lower extremities with other complications 62/83/1517  . DEGENERATIVE JOINT DISEASE, KNEE 07/09/2008  . 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 Hyperlipemia 07/03/2008  . Human immunodeficiency virus (HIV) disease ([place]) 03/01/2007  . Essential hypertension 03/01/2007  . GERD 03/01/2007    Past Surgical History:  Procedure Laterality Date  . ENDOVENOUS ABLATION SAPHENOUS VEIN W/ LASER  07-17-2011 LEFT GRERATER SAPHENOUS VEIN AND STAB PHLEBECTOMIES   10-20   LEFT LEG  . VEIN LIGATION AND STRIPPING Bilateral        Home Medications    Prior to Admission medications   Medication Sig Start Date End Date Taking? Authorizing Provider  ACCU-CHEK FASTCLIX LANCETS MISC USE TO CHECK BLOOD SUGAR UP TO 4 TIMES A DAY 05/14/17   [provider]  ACCU-CHEK GUIDE test strip USE TO CHECK BLOOD SUGAR UP TO 4 TIMES A DAY 05/14/17   [provider]  Alcohol Swabs (SM ALCOHOL PREP) 70 % PADS USE 4 TIMES DAILY AS NEEDED 04/30/17   [provider]  apixaban (ELIQUIS) 5 MG TABS tablet Take 1 tablet (5  mg total) by mouth 2 (two) times daily. 08/03/17   Collier Salina, MD  ASPIR-LOW 81 MG EC tablet TAKE 1 TABLET BY MOUTH DAILY. Patient taking differently: TAKE 1 TABLET (81MG ) BY MOUTH DAILY. 01/09/17   Rice, Resa Miner, MD  atorvastatin (LIPITOR) 40 MG tablet Take 1 tablet (40 mg total) by mouth daily. 09/21/16   Rice, Resa Miner, MD  baclofen (LIORESAL) 10 MG tablet Take 1 tablet (10 mg total) by mouth 3 (three) times daily as needed for muscle spasms. 08/03/17   Rice, Resa Miner, MD  chlorpheniramine (CHLOR-TRIMETON) 4 MG tablet Take 1 tablet (4 mg  total) by mouth every 4 (four) hours as needed for allergies. 09/14/15   Rice, Resa Miner, MD  cyclobenzaprine (FLEXERIL) 10 MG tablet Take 1 tablet (10 mg total) by mouth 2 (two) times daily as needed for muscle spasms. 09/24/17   Jeannett Senior, PA-C  DESCOVY 200-25 MG tablet TAKE 1 TABLET BY MOUTH ONCE DAILY 09/11/17   Tommy Medal, Lavell Islam, MD  doxycycline (VIBRA-TABS) 100 MG tablet Take 1 tablet (100 mg total) 2 (two) times daily by mouth. 08/06/17   Trula Slade, DPM  gabapentin (NEURONTIN) 600 MG tablet TAKE 1 TABLET BY MOUTH 3 TIMES DAILY. Patient taking differently: TAKE 1-2 TABLETS (600mg -1200mg ) BY MOUTH 3 TIMES DAILY. 04/12/17   Axel Filler, MD  HUMALOG KWIKPEN 100 UNIT/ML KiwkPen INJECT 10 UNITS TOTAL INTO THE SKIN 4 TIMES DAILY 08/14/17   Collier Salina, MD  LANTUS SOLOSTAR 100 UNIT/ML Solostar Pen INJECT 23 UNITS INTO THE SKIN DAILY AT 10 PM. 09/13/17   Rice, Resa Miner, MD  liraglutide (VICTOZA) 18 MG/3ML SOPN Inject 0.3 mLs (1.8 mg total) into the skin daily. E11.65 Patient taking differently: Inject 1.2 mg into the skin daily. E11.65 02/09/17   Collier Salina, MD  losartan (COZAAR) 50 MG tablet TAKE 1 TABLET BY MOUTH DAILY. Patient taking differently: TAKE 1 TABLET (50MG ) BY MOUTH DAILY. 01/31/17   Collier Salina, MD  Multiple Vitamins-Minerals (MULTIVITAMIN WITH MINERALS) tablet Take 1 tablet by mouth daily.    [provider]  Omega-3 Fatty Acids (FISH OIL) 1000 MG CAPS Take 1 capsule by mouth 2 (two) times daily.     [provider]  pantoprazole (PROTONIX) 40 MG tablet TAKE 1 TABLET BY MOUTH DAILY. Patient taking differently: TAKE 1 TABLET (40MG ) BY MOUTH DAILY. 01/30/17   Truman Hayward, MD  saccharomyces boulardii (FLORASTOR) 250 MG capsule Take 1 capsule (250 mg total) 2 (two) times daily by mouth. 08/06/17   Trula Slade, DPM  sildenafil (VIAGRA) 50 MG tablet Take 1 tablet (50 mg total) by mouth daily as needed  for erectile dysfunction. 02/06/17   Rice, Resa Miner, MD  Simethicone 180 MG CAPS Take 1 capsule (180 mg total) by mouth 3 (three) times daily. 09/15/16   Collier Salina, MD  TIVICAY 50 MG tablet TAKE 1 TABLET BY MOUTH ONCE DAILY 09/11/17   Tommy Medal, Lavell Islam, MD  traMADol (ULTRAM) 50 MG tablet Take 1 tablet (50 mg total) by mouth every 6 (six) hours as needed. 09/24/17   Aura Bibby, Lahoma Rocker, PA-C  triamterene-hydrochlorothiazide (MAXZIDE) 75-50 MG tablet TAKE 1 TABLET BY MOUTH ONCE DAILY Patient taking differently: TAKE 1/2 TABLET BY MOUTH ONCE DAILY 09/21/16   Tommy Medal, Lavell Islam, MD  UNIFINE PENTIPS 31G X 8 MM MISC USE TO INJECT INSULIN 5 TIMES DAILY PLUS LIRAGLUTIDE ONCE DAILY 05/30/17   [provider]  verapamil (  CALAN-SR) 240 MG CR tablet Take 1 tablet (240 mg total) by mouth daily. 06/07/17   Truman Hayward, MD    Family History Family History  Problem Relation Age of Onset  . Diabetes Father   . Kidney disease Father   . Heart failure Father   . Hyperlipidemia Father   . Hypertension Father   . Osteoarthritis Mother     Social History Social History   Tobacco Use  . Smoking status: Former Smoker    Packs/day: 1.00    Years: 28.00    Pack years: 28.00    Types: Cigarettes    Last attempt to quit: 07/02/2005    Years since quitting: 12.2  . Smokeless tobacco: Never Used  Substance Use Topics  . Alcohol use: Yes    Alcohol/week: 0.0 oz    Comment: 05/14/2017 "1-2 drinks/year"  . Drug use: No     Allergies   Amoxicillin-pot clavulanate   Review of Systems Review of Systems  Constitutional: Negative for chills and fever.  Respiratory: Negative for cough, chest tightness and shortness of breath.   Cardiovascular: Negative for chest pain, palpitations and leg swelling.  Gastrointestinal: Negative for abdominal distention, abdominal pain, diarrhea, nausea and vomiting.  Genitourinary: Negative for dysuria, frequency, hematuria and urgency.    Musculoskeletal: Positive for arthralgias, back pain and myalgias. Negative for neck pain and neck stiffness.  Skin: Negative for rash.  Allergic/Immunologic: Negative for immunocompromised state.  Neurological: Negative for dizziness, weakness, light-headedness, numbness and headaches.  All other systems reviewed and are negative.    Physical Exam Updated Vital Signs BP (!) 152/87 (BP Location: Right Arm)   Pulse 88   Temp 98.4 F (36.9 C) (Oral)   Resp 17   SpO2 99%   Physical Exam  Constitutional: He appears well-developed and well-nourished. No distress.  HENT:  Head: Normocephalic and atraumatic.  Eyes: Conjunctivae are normal.  Neck: Neck supple.  Cardiovascular: Normal rate, regular rhythm and normal heart sounds.  Pulmonary/Chest: Effort normal. No respiratory distress. He has no wheezes. He has no rales.  Abdominal: Soft. Bowel sounds are normal. He exhibits no distension. There is no tenderness. There is no rebound.  Musculoskeletal: He exhibits no edema.  Tenderness to palpation over midline lower lumbar spine and bilateral paravertebral spinal muscles.  Pain with bilateral straight leg raise.  Neurological: He is alert.  5/5 and equal lower extremity strength. 2+ and equal patellar reflexes bilaterally. Pt able to dorsiflex bilateral toes and feet with good strength against resistance. Equal sensation bilaterally over thighs and lower legs.   Skin: Skin is warm and dry.  Nursing note and vitals reviewed.    ED Treatments / Results  Labs (all labs ordered are listed, but only abnormal results are displayed) Labs Reviewed - No data to display  EKG  EKG Interpretation None       Radiology No results found.  Procedures Procedures (including critical care time)  Medications Ordered in ED Medications - No data to display   Initial Impression / Assessment and Plan / ED Course  I have reviewed the triage vital signs and the nursing notes.  Pertinent  labs & imaging results that were available during my care of the patient were reviewed by me and considered in my medical decision making (see chart for details).    Patient in emergency department with lower back pain after lifting a heavy patient.  Patient is having pain radiating to bilateral thighs, but denies pain radiating below  his knees.  No numbness or weakness on the exam.  He is ambulatory.  No fever.  No IV drug use.  No trouble controlling bowels or bladder.  No concern for cauda equina at this time.  Will treat with muscle relaxants, tramadol for severe pain, acetaminophen for mild to moderate pain.  Patient is on blood thinners and unable to take NSAIDs.  Will have patient follow-up with family doctor if pain is not improving.   Vitals:   09/23/17 2310 09/24/17 0108  BP: (!) 154/90 (!) 152/87  Pulse: 87 88  Resp: 20 17  Temp: 98.4 F (36.9 C)   TempSrc: Oral   SpO2: 99% 99%    Final Clinical Impressions(s) / ED Diagnoses   Final diagnoses:  Acute bilateral low back pain with bilateral sciatica    ED Discharge Orders        Ordered    traMADol (ULTRAM) 50 MG tablet  Every 6 hours PRN     09/24/17 0101    cyclobenzaprine (FLEXERIL) 10 MG tablet  2 times daily PRN     09/24/17 0101       Jeannett Senior, PA-C 09/24/17 0114    Varney Biles, MD 09/25/17 [id number]

## 2017-09-24 NOTE — Discharge Instructions (Signed)
Continue to take acetaminophen for pain. Tramadol for severe pain. Flexeril for muscle spasms. Follow up with your family doctor for further imaging and treatment if not improving. Avoid any heavy lifting.

## 2017-09-26 LAB — HIV-1 RNA QUANT-NO REFLEX-BLD
HIV 1 RNA QUANT: NOT DETECTED {copies}/mL
HIV-1 RNA QUANT, LOG: NOT DETECTED {Log_copies}/mL

## 2017-10-01 ENCOUNTER — Other Ambulatory Visit: Payer: Self-pay | Admitting: Internal Medicine

## 2017-10-01 DIAGNOSIS — E1140 Type 2 diabetes mellitus with diabetic neuropathy, unspecified: Secondary | ICD-10-CM

## 2017-10-01 DIAGNOSIS — G8929 Other chronic pain: Secondary | ICD-10-CM

## 2017-10-01 DIAGNOSIS — Z794 Long term (current) use of insulin: Secondary | ICD-10-CM

## 2017-10-01 DIAGNOSIS — M5441 Lumbago with sciatica, right side: Principal | ICD-10-CM

## 2017-10-01 MED FILL — ULTICARE ALCOHOL SWABS 70 %: 70 | 90 days supply | Qty: 400 | Fill #4

## 2017-10-04 ENCOUNTER — Other Ambulatory Visit: Payer: Self-pay | Admitting: Infectious Disease

## 2017-10-04 ENCOUNTER — Encounter: Payer: Self-pay | Admitting: Infectious Disease

## 2017-10-04 ENCOUNTER — Ambulatory Visit: Payer: 59 | Admitting: Infectious Disease

## 2017-10-04 VITALS — BP 153/88 | HR 71 | Temp 98.6°F | Ht 74.0 in | Wt 227.0 lb

## 2017-10-04 DIAGNOSIS — I82409 Acute embolism and thrombosis of unspecified deep veins of unspecified lower extremity: Secondary | ICD-10-CM | POA: Diagnosis not present

## 2017-10-04 DIAGNOSIS — B20 Human immunodeficiency virus [HIV] disease: Secondary | ICD-10-CM

## 2017-10-04 DIAGNOSIS — M5441 Lumbago with sciatica, right side: Secondary | ICD-10-CM

## 2017-10-04 DIAGNOSIS — M5442 Lumbago with sciatica, left side: Secondary | ICD-10-CM

## 2017-10-04 DIAGNOSIS — Z794 Long term (current) use of insulin: Secondary | ICD-10-CM

## 2017-10-04 DIAGNOSIS — Z113 Encounter for screening for infections with a predominantly sexual mode of transmission: Secondary | ICD-10-CM

## 2017-10-04 DIAGNOSIS — E1140 Type 2 diabetes mellitus with diabetic neuropathy, unspecified: Secondary | ICD-10-CM | POA: Diagnosis not present

## 2017-10-04 DIAGNOSIS — I10 Essential (primary) hypertension: Secondary | ICD-10-CM | POA: Diagnosis not present

## 2017-10-04 DIAGNOSIS — G8929 Other chronic pain: Secondary | ICD-10-CM

## 2017-10-04 MED ORDER — BICTEGRAVIR-EMTRICITAB-TENOFOV 50-200-25 MG PO TABS: 1.0000 | ORAL_TABLET | Freq: Every day | ORAL | 11 refills | Status: DC

## 2017-10-04 MED FILL — ATORVASTATIN 40 MG TABLET: 40 | 90 days supply | Qty: 90 | Fill #0

## 2017-10-04 MED FILL — PANTOPRAZOLE SOD DR 40 MG T: 40 | 90 days supply | Qty: 90 | Fill #0

## 2017-10-04 MED FILL — ACCU-CHEK FASTCLIX LANCETS: 75 days supply | Qty: 306 | Fill #3

## 2017-10-04 MED FILL — BACLOFEN 10 MG TABLET: 10 | 30 days supply | Qty: 90 | Fill #0

## 2017-10-04 MED FILL — BIKTARVY 50-200-25 MG TABS: 50-200-25 | 30 days supply | Qty: 30 | Fill #0
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
## 2017-10-10 ENCOUNTER — Other Ambulatory Visit: Payer: Self-pay | Admitting: Occupational Medicine

## 2017-10-10 ENCOUNTER — Ambulatory Visit: Payer: Self-pay

## 2017-10-10 DIAGNOSIS — M545 Low back pain: Secondary | ICD-10-CM

## 2017-10-10 MED FILL — predniSONE 5 MG TABS: 5 | 6 days supply | Qty: 21 | Fill #0

## 2017-10-10 MED FILL — traMADol HCL 50 MG TABS: 50 | 7 days supply | Qty: 40 | Fill #0

## 2017-10-11 MED FILL — SILDENAFIL CITRATE 50 MG TA: 50 | 30 days supply | Qty: 6 | Fill #4

## 2017-10-11 MED FILL — ELIQUIS 5 MG TABLET: 5 | 30 days supply | Qty: 60 | Fill #2

## 2017-10-25 ENCOUNTER — Other Ambulatory Visit: Payer: Self-pay | Admitting: Student in an Organized Health Care Education/Training Program

## 2017-10-25 ENCOUNTER — Other Ambulatory Visit: Payer: Self-pay | Admitting: Internal Medicine

## 2017-10-25 DIAGNOSIS — R143 Flatulence: Secondary | ICD-10-CM

## 2017-10-25 NOTE — Telephone Encounter (Signed)
Next appt scheduled  12/07/17 with PCP.

## 2017-10-26 MED FILL — GABAPENTIN 600 MG TABLET: 600 | 90 days supply | Qty: 270 | Fill #0

## 2017-10-26 MED FILL — SIMETHICONE 180 MG SOFTGEL: 180 | 90 days supply | Qty: 270 | Fill #0

## 2017-10-31 MED FILL — UNIFINE PENTIPS 8MM 31G: 31G X 8 MM | 33 days supply | Qty: 200 | Fill #7

## 2017-10-31 MED FILL — HUMALOG 100 UNITS/ML KWIKPE: 100 | 38 days supply | Qty: 15 | Fill #2

## 2017-11-02 ENCOUNTER — Other Ambulatory Visit: Payer: Self-pay | Admitting: *Deleted

## 2017-11-02 ENCOUNTER — Other Ambulatory Visit: Payer: Self-pay | Admitting: Internal Medicine

## 2017-11-02 DIAGNOSIS — G8929 Other chronic pain: Secondary | ICD-10-CM

## 2017-11-02 DIAGNOSIS — M5441 Lumbago with sciatica, right side: Principal | ICD-10-CM

## 2017-11-02 MED ORDER — BACLOFEN 10 MG PO TABS: 10.0000 mg | ORAL_TABLET | Freq: Three times a day (TID) | ORAL | 0 refills | Status: DC

## 2017-11-02 MED FILL — BACLOFEN 10 MG TABLET: 10 | 30 days supply | Qty: 90 | Fill #0

## 2017-11-02 NOTE — Telephone Encounter (Signed)
Refilled 10/04/17 x 90 tabs.

## 2017-11-09 ENCOUNTER — Encounter: Payer: 59 | Admitting: Internal Medicine

## 2017-11-10 ENCOUNTER — Emergency Department (HOSPITAL_COMMUNITY)
Admission: EM | Admit: 2017-11-10 | Discharge: 2017-11-10 | Disposition: A | Discharge status: Home / Self Care | Payer: PRIVATE HEALTH INSURANCE | Attending: Emergency Medicine | Admitting: Emergency Medicine

## 2017-11-10 ENCOUNTER — Encounter (HOSPITAL_COMMUNITY): Payer: Self-pay | Admitting: *Deleted

## 2017-11-10 ENCOUNTER — Emergency Department (HOSPITAL_BASED_OUTPATIENT_CLINIC_OR_DEPARTMENT_OTHER)
Admit: 2017-11-10 | Discharge: 2017-11-10 | Disposition: A | Discharge status: Home / Self Care | Payer: PRIVATE HEALTH INSURANCE | Attending: Emergency Medicine | Admitting: Emergency Medicine

## 2017-11-10 ENCOUNTER — Other Ambulatory Visit: Payer: Self-pay

## 2017-11-10 DIAGNOSIS — M5431 Sciatica, right side: Secondary | ICD-10-CM | POA: Insufficient documentation

## 2017-11-10 DIAGNOSIS — E1140 Type 2 diabetes mellitus with diabetic neuropathy, unspecified: Secondary | ICD-10-CM | POA: Insufficient documentation

## 2017-11-10 DIAGNOSIS — Z79899 Other long term (current) drug therapy: Secondary | ICD-10-CM | POA: Insufficient documentation

## 2017-11-10 DIAGNOSIS — Z7901 Long term (current) use of anticoagulants: Secondary | ICD-10-CM | POA: Diagnosis not present

## 2017-11-10 DIAGNOSIS — Z86718 Personal history of other venous thrombosis and embolism: Secondary | ICD-10-CM | POA: Diagnosis not present

## 2017-11-10 DIAGNOSIS — E119 Type 2 diabetes mellitus without complications: Secondary | ICD-10-CM | POA: Diagnosis not present

## 2017-11-10 DIAGNOSIS — I10 Essential (primary) hypertension: Secondary | ICD-10-CM | POA: Diagnosis not present

## 2017-11-10 DIAGNOSIS — Z7982 Long term (current) use of aspirin: Secondary | ICD-10-CM | POA: Diagnosis not present

## 2017-11-10 DIAGNOSIS — Z87891 Personal history of nicotine dependence: Secondary | ICD-10-CM | POA: Insufficient documentation

## 2017-11-10 DIAGNOSIS — M79661 Pain in right lower leg: Secondary | ICD-10-CM | POA: Diagnosis present

## 2017-11-10 DIAGNOSIS — M79609 Pain in unspecified limb: Secondary | ICD-10-CM

## 2017-11-10 DIAGNOSIS — Z794 Long term (current) use of insulin: Secondary | ICD-10-CM | POA: Diagnosis not present

## 2017-11-10 DIAGNOSIS — M5441 Lumbago with sciatica, right side: Secondary | ICD-10-CM | POA: Diagnosis not present

## 2017-11-10 LAB — CBC WITH DIFFERENTIAL/PLATELET
BASOS ABS: 0 10*3/uL (ref 0.0–0.1)
Basophils Relative: 0 %
EOS ABS: 0 10*3/uL (ref 0.0–0.7)
EOS PCT: 0 %
HCT: 40.2 % (ref 39.0–52.0)
HEMOGLOBIN: 13.7 g/dL (ref 13.0–17.0)
LYMPHS PCT: 15 %
Lymphs Abs: 1 10*3/uL (ref 0.7–4.0)
MCH: 27.8 pg (ref 26.0–34.0)
MCHC: 34.1 g/dL (ref 30.0–36.0)
MCV: 81.7 fL (ref 78.0–100.0)
Monocytes Absolute: 0.4 10*3/uL (ref 0.1–1.0)
Monocytes Relative: 5 %
NEUTROS PCT: 80 %
Neutro Abs: 5.3 10*3/uL (ref 1.7–7.7)
PLATELETS: 241 10*3/uL (ref 150–400)
RBC: 4.92 MIL/uL (ref 4.22–5.81)
RDW: 13.7 % (ref 11.5–15.5)
WBC: 6.7 10*3/uL (ref 4.0–10.5)

## 2017-11-10 LAB — BASIC METABOLIC PANEL
ANION GAP: 7 (ref 5–15)
BUN: 18 mg/dL (ref 6–20)
CO2: 26 mmol/L (ref 22–32)
Calcium: 9.3 mg/dL (ref 8.9–10.3)
Chloride: 103 mmol/L (ref 101–111)
Creatinine, Ser: 0.93 mg/dL (ref 0.61–1.24)
GFR calc Af Amer: >60 mL/min (ref >60)
Glucose, Bld: 177 mg/dL — ABNORMAL HIGH (ref 65–99)
POTASSIUM: 3.8 mmol/L (ref 3.5–5.1)
SODIUM: 136 mmol/L (ref 135–145)

## 2017-11-10 MED ORDER — HYDROCODONE-ACETAMINOPHEN 5-325 MG PO TABS: 1.0000 | ORAL_TABLET | Freq: Four times a day (QID) | ORAL | 0 refills | Status: DC | PRN

## 2017-11-10 MED ORDER — HYDROMORPHONE HCL 1 MG/ML IJ SOLN
1.0000 mg | Freq: Once | INTRAMUSCULAR | Status: AC
  Administered 2017-11-10: 1 mg via INTRAVENOUS
  Filled 2017-11-10: qty 1

## 2017-11-10 MED ORDER — KETOROLAC TROMETHAMINE 30 MG/ML IJ SOLN
30.0000 mg | Freq: Once | INTRAMUSCULAR | Status: AC
  Administered 2017-11-10: 30 mg via INTRAVENOUS
  Filled 2017-11-10: qty 1

## 2017-11-10 MED ORDER — DIAZEPAM 5 MG/ML IJ SOLN
5.0000 mg | Freq: Once | INTRAMUSCULAR | Status: AC
  Administered 2017-11-10: 5 mg via INTRAVENOUS
  Filled 2017-11-10: qty 2

## 2017-11-10 MED ORDER — MORPHINE SULFATE (PF) 4 MG/ML IV SOLN
4.0000 mg | Freq: Once | INTRAVENOUS | Status: AC
  Administered 2017-11-10: 4 mg via INTRAVENOUS
  Filled 2017-11-10: qty 1

## 2017-11-10 MED ORDER — PREDNISONE 20 MG PO TABS: ORAL_TABLET | ORAL | 0 refills | Status: DC

## 2017-11-10 MED ORDER — HYDROMORPHONE HCL 2 MG/ML IJ SOLN: 2.0000 mg | Freq: Once | INTRAMUSCULAR | Status: DC

## 2017-11-10 MED ORDER — METHYLPREDNISOLONE SODIUM SUCC 125 MG IJ SOLR
125.0000 mg | Freq: Once | INTRAMUSCULAR | Status: AC
  Administered 2017-11-10: 125 mg via INTRAVENOUS
  Filled 2017-11-10: qty 2

## 2017-11-10 NOTE — ED Notes (Signed)
Back pain going down both legs.

## 2017-11-10 NOTE — ED Triage Notes (Signed)
Pt developed symptoms of rt leg pain with numbness. Has history of DVT's. Also has increased back pain from injury in December. This afternoon had near syncopal episode due to pain.

## 2017-11-10 NOTE — ED Notes (Signed)
Bed: WA17 Expected date:  Expected time:  Means of arrival:  Comments: 
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
## 2017-11-10 NOTE — Discharge Instructions (Signed)
Take prednisone as prescribed.   Take vicodin for severe pain.   See your doctor. Follow up with worker's comp regarding your back  Return to ER if you have worse back pain, numbness, weakness, chest pain, trouble breathing, calf pain.

## 2017-11-10 NOTE — Progress Notes (Signed)
VASCULAR LAB PRELIMINARY  PRELIMINARY  PRELIMINARY  PRELIMINARY  Right lower extremity venous duplex completed.    Preliminary report:  There is no DVT or SVT noted in the right lower extremity.   Gave report to Dr. Fleeta Emmer, [facility], RVT 11/10/2017, 6:17 PM

## 2017-11-12 ENCOUNTER — Other Ambulatory Visit: Payer: Self-pay | Admitting: Internal Medicine

## 2017-11-12 MED FILL — BIKTARVY 50-200-25 MG TABS: 50-200-25 | 30 days supply | Qty: 30 | Fill #1

## 2017-11-12 MED FILL — ELIQUIS 5 MG TABLET: 5 | 30 days supply | Qty: 60 | Fill #3

## 2017-11-12 MED FILL — LOSARTAN POTASSIUM 50 MG TA: 50 | 90 days supply | Qty: 90 | Fill #3

## 2017-11-12 MED FILL — ACCU-CHEK GUIDE STRP: 25 days supply | Qty: 100 | Fill #0

## 2017-11-12 NOTE — Telephone Encounter (Signed)
Next appt scheduled  3/8 with PCP.

## 2017-11-19 ENCOUNTER — Telehealth: Payer: Self-pay | Admitting: Podiatry

## 2017-11-19 NOTE — Telephone Encounter (Signed)
Called pt back and informed him he would need to fill out and sign a medical records release form. Told the pt we could send it via e-mail, fax, or he could come by the office to fill it out and pick up the CD of x-rays then. Pt stated he would be in around 1:00 pm to fill out the form to pick the CD of his x-rays up. I told him there would be a $5.00 charge to burn the x-rays to the CD.

## 2017-11-19 NOTE — Telephone Encounter (Signed)
I saw Dr. Jacqualyn Posey back in November and I'm scheduled to be seen at [facility] tomorrow and I need to have my x-rays released to them. Can you please give me a call at [phone] or you can reach me on my cell at [phone]. Thanks. Bye.

## 2017-11-20 ENCOUNTER — Other Ambulatory Visit (HOSPITAL_COMMUNITY): Payer: Self-pay | Admitting: Orthopedic Surgery

## 2017-11-20 DIAGNOSIS — M5126 Other intervertebral disc displacement, lumbar region: Secondary | ICD-10-CM

## 2017-11-21 MED FILL — HYDROCODON-APAP 5-325: 5-325 | 13 days supply | Qty: 40 | Fill #0

## 2017-11-24 ENCOUNTER — Ambulatory Visit (HOSPITAL_COMMUNITY)
Admission: RE | Admit: 2017-11-24 | Discharge: 2017-11-24 | Disposition: A | Discharge status: Home / Self Care | Payer: PRIVATE HEALTH INSURANCE | Source: Ambulatory Visit | Attending: Orthopedic Surgery | Admitting: Orthopedic Surgery

## 2017-11-24 DIAGNOSIS — M5416 Radiculopathy, lumbar region: Secondary | ICD-10-CM | POA: Diagnosis not present

## 2017-11-24 DIAGNOSIS — M48061 Spinal stenosis, lumbar region without neurogenic claudication: Secondary | ICD-10-CM | POA: Insufficient documentation

## 2017-11-24 DIAGNOSIS — M4316 Spondylolisthesis, lumbar region: Secondary | ICD-10-CM | POA: Diagnosis not present

## 2017-11-24 DIAGNOSIS — M5126 Other intervertebral disc displacement, lumbar region: Secondary | ICD-10-CM | POA: Insufficient documentation

## 2017-11-24 DIAGNOSIS — M1288 Other specific arthropathies, not elsewhere classified, other specified site: Secondary | ICD-10-CM | POA: Diagnosis not present

## 2017-11-29 ENCOUNTER — Encounter: Payer: Self-pay | Admitting: Physical Therapy

## 2017-11-29 ENCOUNTER — Other Ambulatory Visit: Payer: Self-pay

## 2017-11-29 ENCOUNTER — Ambulatory Visit: Payer: PRIVATE HEALTH INSURANCE | Attending: Orthopedic Surgery | Admitting: Physical Therapy

## 2017-11-29 DIAGNOSIS — M6281 Muscle weakness (generalized): Secondary | ICD-10-CM | POA: Insufficient documentation

## 2017-11-29 DIAGNOSIS — M62830 Muscle spasm of back: Secondary | ICD-10-CM | POA: Diagnosis present

## 2017-11-29 DIAGNOSIS — M5441 Lumbago with sciatica, right side: Secondary | ICD-10-CM | POA: Insufficient documentation

## 2017-11-29 MED FILL — LANTUS SOLOSTAR 100 UNITS/M: 100 | 90 days supply | Qty: 21 | Fill #1

## 2017-11-29 NOTE — Therapy (Signed)
[facility] [address] [place], [place], [address] Phone: [phone]   Fax:  [phone]  Physical Therapy Evaluation  Patient Details  Name: Trevor Fischer MRN: [id number] Date of Birth: 07-02-1961 Referring Provider: Dr. Elta Guadeloupe L. Dumonski   Encounter Date: 11/29/2017  PT End of Session - 11/29/17 1204    Visit Number  1    Date for PT Re-Evaluation  01/03/18    Authorization Type  W/c    Authorization - Visit Number  1    Authorization - Number of Visits  9    PT Start Time  1100    PT Stop Time  1145    PT Time Calculation (min)  45 min    Activity Tolerance  Treatment limited secondary to medical complications (Comment)    Behavior During Therapy  [facility] for tasks assessed/performed       Past Medical History:  Diagnosis Date  . Allergic rhinitis   . Degenerative joint disease of knee   . DVT (deep venous thrombosis) (HCC) RLE X 2   Ended anticoagulation in 2012  . GERD (gastroesophageal reflux disease)   . 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 HIV infection ([place])   . Hx MRSA infection   . Hyperlipidemia   . Hypertension   . Pulmonary embolism ([place]) 05/14/2017  . Superficial thrombophlebitis   . Type II diabetes mellitus ([place]) 9/09    Past Surgical History:  Procedure Laterality Date  . ENDOVENOUS ABLATION SAPHENOUS VEIN W/ LASER  07-17-2011 LEFT GRERATER SAPHENOUS VEIN AND STAB PHLEBECTOMIES   10-20   LEFT LEG  . VEIN LIGATION AND STRIPPING Bilateral     There were no vitals filed for this visit.   Subjective Assessment - 11/29/17 1110    Subjective  Date of injury was 09/21/2017. Patient was at work. Patient was helping a patient off the floor by lifting the leg of a 600# person and felt a pop in his back.  The next day unable to get out of bed.   One time patient felt like his bowels were going to fall out and went to ER.  Patient was referred to an ortho consultation.      Diagnostic tests  MRI showed 2 bulging disc on a nerve    Patient Stated Goals  move better and reduce pain    Currently in Pain?  Yes    Pain Score  6  high is 9/10    Pain Location  Leg    Pain Orientation  Right    Pain Descriptors / Indicators  Aching;Dull numb from right knee to foot    Pain Type  Acute pain    Pain Onset  More than a month ago    Pain Frequency  Constant    Aggravating Factors   sitting too long on a hard chair, sleep on right, walking too much    Pain Relieving Factors  lay on left side, lay on back, walking to reduce pain for short period,     Multiple Pain Sites  No         [facility] PT Assessment - 11/29/17 0001      Assessment   Medical Diagnosis  Lumbar radiculopathy    Referring Provider  Dr. Elta Guadeloupe L. Dumonski    Onset Date/Surgical Date  09/21/17    Prior Therapy  none      Precautions   Precautions  Back    Precaution Comments  10 or less, no pushing, lifting, pulling      Restrictions   Weight Bearing Restrictions  No      Balance Screen   Has the patient fallen in the past 6 months  No    Has the patient had a decrease in activity level because of a fear of falling?   No    Is the patient reluctant to leave their home because of a fear of falling?   No      Home Film/video editor residence      Prior Function   Level of Independence  Independent    Vocation  Workers comp    Leisure  yardwork      Cognition   Overall Cognitive Status  Within Functional Limits for tasks assessed      Observation/Other Assessments   Focus on Therapeutic Outcomes (FOTO)   54% limitation and goal is 41% limitation      Posture/Postural Control   Posture/Postural Control  Postural limitations    Posture Comments  sit on left side      ROM / Strength   AROM / PROM / Strength  AROM;PROM;Strength      Strength   Right Hip Flexion  4/5    Right Hip Extension  3-/5    Left Hip Flexion  5/5    Right Knee Flexion  3+/5    Right  Knee Extension  4/5      Palpation   SI assessment   right ilium is anteriorly rotated    Palpation comment  tender in mid right hamstring,  and midline of right calf      Special Tests    Special Tests  Sacrolliac Tests;Lumbar    Lumbar Tests  Straight Leg Raise    Sacroiliac Tests   Pelvic Compression      Straight Leg Raise   Findings  Positive    Side   Right    Comment  pain at 55 degrees      Pelvic Compression   Findings  Positive    Side  Right    comment  pain      Ambulation/Gait   Gait Pattern  Decreased hip/knee flexion - right;Decreased weight shift to right             Objective measurements completed on examination: See above findings.      [facility] Adult PT Treatment/Exercise - 11/29/17 0001      Therapeutic Activites    Therapeutic Activities  ADL's    ADL's  home tasks with abdominal bracing  with bed mobility, sit to stand, getting in and out of car       Manual Therapy   Manual Therapy  Taping    McConnell  taped lumbar and low thoracic to keep spine in extension             PT Education - 11/29/17 1151    Education provided  Yes    Education Details  lateral shift, body mechanics with bed mobility, getting out of a chair, getting into a car; instruction on taking tape off in 2-3 days    Person(s) Educated  Patient    Methods  Explanation;Demonstration;Verbal cues;Handout    Comprehension  Returned demonstration;Verbalized understanding       PT Short Term Goals - 11/29/17 1354      PT SHORT TERM GOAL #1   Title  independent with initial HEP    Time  4    Period  Weeks    Status  New    Target Date  12/27/17      PT SHORT TERM GOAL #2   Title  able to centralize the pain into his lumbar with lateral shifting    Time  4    Period  Weeks    Target Date  12/27/17      PT SHORT TERM GOAL #3   Title  ability to demonstrate correct body mechanics with daily tasks to decrease strain on back    Time  4    Period  Weeks     Status  New    Target Date  12/27/17        PT Long Term Goals - 11/29/17 1355      PT LONG TERM GOAL #1   Title  independent with HEP and understand how to progress himself    Time  5    Period  Weeks    Status  New    Target Date  01/03/18      PT LONG TERM GOAL #2   Title  able to abolish his pain with McKenzie technique     Time  5    Period  Weeks    Status  New    Target Date  01/03/18      PT LONG TERM GOAL #3   Title  able to tolerate work tasks with pain decreased >/= 75% due to abdominal bracing and centralization of pain    Time  5    Period  Weeks    Status  New    Target Date  01/03/18      PT LONG TERM GOAL #4   Title  able to sit without difficulty due to pain minimal to none on bilateral buttocks    Time  5    Period  Weeks    Status  New    Target Date  01/03/18      PT LONG TERM GOAL #5   Title  FOTO score </= 41% limitation    Time  5    Period  Weeks    Status  New    Target Date  01/03/18             Plan - 11/29/17 1205    Clinical Impression Statement  Patient is a 57 year old male with lumbar pain going down the right leg due to bulging disc hitting his nerve. Patient injured himself helping with a 600 pound patient when he heard a pop in his back.  Right buttocks down to right knee pain is 6-9/10 and from right knee to foot he has numbness.  Standing lateral shift will centralize his pain.  Straight leg raise is 55 degrees.  Positive compression on the right for SI test.  Right ilium is rotated anteriorly.  Tenderness located in mid right hamstring and gastroc.  Decreased right hip flexion and extension with limp on the right.  Patient will benefit from skilled therapy to centralize pain and restore mobility to perform daily function.     History and Personal Factors relevant to plan of care:  HIV positive; Diabetes    Clinical Presentation  Evolving    Clinical Presentation due to:  Patient unable to do any lifting above 10#, no  twisting, no pulling, no yard work     Clinical Decision Making  Moderate    Rehab Potential  Excellent    PT Frequency  2x / week 1 time first week; 2x/week for 4 weeks    PT Duration  Other (comment) 5 weeks    PT Treatment/Interventions  Iontophoresis 4mg /ml Dexamethasone;Cryotherapy;Electrical Stimulation;Moist Heat;Traction;Ultrasound;Therapeutic exercise;Therapeutic activities;Neuromuscular re-education;Patient/family education;Manual techniques    PT Next Visit Plan  neural tension stretch to right LE, Mckenzie lateral shift to keep pain centralized, once centralized work on extension, taping low back into extension, abdominal bracing with transitional movements, back extensor strength    PT Home Exercise Plan  progress McK[place]ie exercise    Consulted and Agree with Plan of Care  Patient       Patient will benefit from skilled therapeutic intervention in order to improve the following deficits and impairments:  Increased fascial restricitons, Pain, Decreased mobility, Increased muscle spasms, Decreased strength, Decreased range of motion, Decreased endurance, Decreased activity tolerance, Impaired flexibility  Visit Diagnosis: Acute right-sided low back pain with right-sided sciatica - Plan: PT plan of care cert/re-cert  Muscle weakness (generalized) - Plan: PT plan of care cert/re-cert  Muscle spasm of back - Plan: PT plan of care cert/re-cert     Problem List Patient Active Problem List   Diagnosis Date Noted  . Acute paronychia of toe of right foot 08/03/2017  . Left shoulder pain 06/25/2017  . Need for immunization against influenza 06/22/2017  . Dyspnea on exertion 05/14/2017  . Personal history of PE (pulmonary embolism) 05/14/2017  . Erectile dysfunction 10/11/2016  . Daytime somnolence 02/20/2016  . Advanced care planning/counseling discussion 02/20/2016  . Cough 09/14/2015  . Back pain 07/26/2015  . Flatulence 05/13/2015  . 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 Trigger thumb of right hand 05/08/2012  . Varicose veins of lower extremities with other complications 00/86/7619  . DEGENERATIVE JOINT DISEASE, KNEE 07/09/2008  . Diabetes mellitus with neuropathy ([place]) 07/03/2008  . Hyperlipemia 07/03/2008  . Human immunodeficiency virus (HIV) disease ([place]) 03/01/2007  . Hypertension 03/01/2007  . GERD 03/01/2007    Earlie Counts, PT 11/29/17 2:00 PM   [facility] [address], [place], [place], [address] Phone: [phone]   Fax:  [phone]  Name: Trevor Fischer MRN: [id number] Date of Birth: [birth year]

## 2017-11-29 NOTE — Patient Instructions (Addendum)
Getting Into / Out of Bed    Lower self to lie down on one side by raising legs and lowering head at the same time. Use arms to assist moving without twisting. Bend both knees to roll onto back if desired. To sit up, start from lying on side, and use same move-ments in reverse. Keep trunk aligned with legs.   Copyright  VHI. All rights reserved.  Avoid Twisting    Avoid twisting or bending back. Pivot around using foot movements, and bend at knees if needed when reaching for articles.   Copyright  VHI. All rights reserved.  Getting Into / Out of Car    Lower self onto seat, scoot back, then bring in one leg at a time. Reverse sequence to get out.   Copyright  VHI. All rights reserved.  Posture - Sitting    Sit upright, head facing forward. Try using a roll to support lower back. Keep shoulders relaxed, and avoid rounded back. Keep hips level with knees. Avoid crossing legs for long periods.   Copyright  VHI. All rights reserved.  Wall Lean Stretch    With right elbow against wall, slowly stretch hips toward wall, other arm supporting trunk. Hold _1 min. Relax.  1 time every hour.  http://orth.exer.us/102   Copyright  VHI. All rights reserved.  [facility] [address], [place], [place] [address] Phone # [phone] Fax [phone]

## 2017-12-03 ENCOUNTER — Ambulatory Visit: Payer: PRIVATE HEALTH INSURANCE | Attending: Orthopedic Surgery

## 2017-12-03 DIAGNOSIS — M6281 Muscle weakness (generalized): Secondary | ICD-10-CM | POA: Diagnosis present

## 2017-12-03 DIAGNOSIS — M62830 Muscle spasm of back: Secondary | ICD-10-CM | POA: Insufficient documentation

## 2017-12-03 DIAGNOSIS — M5441 Lumbago with sciatica, right side: Secondary | ICD-10-CM | POA: Insufficient documentation

## 2017-12-03 NOTE — Patient Instructions (Addendum)
Lower abdominal/core stability exercises  1. Practice your breathing technique: Inhale through your nose expanding your belly and rib cage. Try not to breathe into your chest. Exhale slowly and gradually out your mouth feeling a sense of softness to your body. Practice multiple times. This can be performed unlimited.  2. Finding the lower abdominals. Laying on your back with the knees bent, place your fingers just below your belly button. Using your breathing technique from above, on your exhale gently pull the belly button away from your fingertips without tensing any other muscles. Practice this 5x. Next, as you exhale, draw belly button inwards and hold onto it...then feel as if you are pulling that muscle across your pelvis like you are tightening a belt. This can be hard to do at first so be patient and practice. Do 5-10 reps 1-3 x day. Always recognize quality over quantity; if your abdominal muscles become tired you will notice you may tighten/contract other muscles. This is the time to take a break.   Practice this first laying on your back, then in sitting, progressing to standing and finally adding it to all your daily movements.     Side Pull: Double Arm   On back, knees bent, feet flat. Arms perpendicular to body, shoulder level, elbows straight but relaxed. Pull arms out to sides, elbows straight. Resistance band comes across collarbones, hands toward floor. Hold momentarily. Slowly return to starting position. Repeat _10__ times. Band color _yellow____   Sash   On back, knees bent, feet flat, left hand on left hip, right hand above left. Pull right arm DIAGONALLY (hip to shoulder) across chest. Bring right arm along head toward floor. Hold momentarily. Slowly return to starting position. Repeat __10_ times. Do with left arm. Band color ___yellow___     Abduction: Clam (Eccentric) - Side-Lying    Lie on side with knees bent. Lift top knee, keeping feet together. Keep trunk  steady. Slowly lower for 3-5 seconds. ___ reps per set, _10__ sets per day, ___ days per week.     [facility] [address] [place], [place] [address] Phone # [phone] Fax [phone]____

## 2017-12-03 NOTE — Therapy (Signed)
[facility] [address] [place], [place], [address] Phone: [phone]   Fax:  [phone]  Physical Therapy Treatment  Patient Details  Name: Trevor Fischer MRN: [id number] Date of Birth: 07-25-1961 Referring Provider: Dr. Elta Guadeloupe L. Dumonski   Encounter Date: 12/03/2017  PT End of Session - 12/03/17 0843    Visit Number  2    Date for PT Re-Evaluation  01/03/18    Authorization Type  W/c    Authorization - Visit Number  2    Authorization - Number of Visits  9    PT Start Time  0801    PT Stop Time  0843    PT Time Calculation (min)  42 min    Activity Tolerance  Patient tolerated treatment well    Behavior During Therapy  [facility] for tasks assessed/performed       Past Medical History:  Diagnosis Date  . Allergic rhinitis   . Degenerative joint disease of knee   . DVT (deep venous thrombosis) (HCC) RLE X 2   Ended anticoagulation in 2012  . GERD (gastroesophageal reflux disease)   . 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 HIV infection ([place])   . Hx MRSA infection   . Hyperlipidemia   . Hypertension   . Pulmonary embolism ([place]) 05/14/2017  . Superficial thrombophlebitis   . Type II diabetes mellitus ([place]) 9/09    Past Surgical History:  Procedure Laterality Date  . ENDOVENOUS ABLATION SAPHENOUS VEIN W/ LASER  07-17-2011 LEFT GRERATER SAPHENOUS VEIN AND STAB PHLEBECTOMIES   10-20   LEFT LEG  . VEIN LIGATION AND STRIPPING Bilateral     There were no vitals filed for this visit.  Subjective Assessment - 12/03/17 0802    Subjective  I had a good night last night.  The tape helped me.      Currently in Pain?  Yes    Pain Score  0-No pain    Pain Location  Back    Pain Orientation  Right    Pain Descriptors / Indicators  Aching;Dull    Pain Type  Acute pain    Pain Radiating Towards  Rt LE     Pain Onset  More than a month ago    Pain Frequency  Constant    Aggravating Factors   sitting too  long on a hard chair, sleep on right, walking too much    Pain Relieving Factors  lay on Lt side, lay on back, exercises for Sciatica                      [facility] Adult PT Treatment/Exercise - 12/03/17 0001      Exercises   Exercises  Knee/Hip;Lumbar      Lumbar Exercises: Aerobic   Nustep  Level 2 x 8 minutes PT present to discuss progress       Lumbar Exercises: Supine   Ab Set  20 reps;5 seconds    Other Supine Lumbar Exercises  supine on foam roll x 3 minutes with abdominal bracing, green theraband horizontal abduction, D2 2x10      Lumbar Exercises: Sidelying   Clam  Both;20 reps    Clam Limitations  green theraband      Manual Therapy   Manual Therapy  Taping    Kinesiotex  Facilitate Muscle      Kinesiotix   Facilitate Muscle   star over low back for pain relief and to  promote extension             PT Education - 12/03/17 0831    Education provided  Yes    Education Details  sidelying clam, supine with band, TA activation    Person(s) Educated  Patient    Methods  Explanation;Demonstration;Handout    Comprehension  Verbalized understanding;Returned demonstration       PT Short Term Goals - 11/29/17 1354      PT SHORT TERM GOAL #1   Title  independent with initial HEP    Time  4    Period  Weeks    Status  New    Target Date  12/27/17      PT SHORT TERM GOAL #2   Title  able to centralize the pain into his lumbar with lateral shifting    Time  4    Period  Weeks    Target Date  12/27/17      PT SHORT TERM GOAL #3   Title  ability to demonstrate correct body mechanics with daily tasks to decrease strain on back    Time  4    Period  Weeks    Status  New    Target Date  12/27/17        PT Long Term Goals - 11/29/17 1355      PT LONG TERM GOAL #1   Title  independent with HEP and understand how to progress himself    Time  5    Period  Weeks    Status  New    Target Date  01/03/18      PT LONG TERM GOAL #2   Title  able to  abolish his pain with McKenzie technique     Time  5    Period  Weeks    Status  New    Target Date  01/03/18      PT LONG TERM GOAL #3   Title  able to tolerate work tasks with pain decreased >/= 75% due to abdominal bracing and centralization of pain    Time  5    Period  Weeks    Status  New    Target Date  01/03/18      PT LONG TERM GOAL #4   Title  able to sit without difficulty due to pain minimal to none on bilateral buttocks    Time  5    Period  Weeks    Status  New    Target Date  01/03/18      PT LONG TERM GOAL #5   Title  FOTO score </= 41% limitation    Time  5    Period  Weeks    Status  New    Target Date  01/03/18            Plan - 12/03/17 4481    Clinical Impression Statement  Pt with only 1 session after evaluation.  Pt demonstrates core weakness with supine exercis on foam roll.  Pt has been applying body mechanics education to getting in/out of the car and bed.  Pt reports no pain or Rt LE radiculopathy over the past 2 days.  Pt with forward trunk flexion and exercise was issued to promote extension.  Pt will continue to benefit from skilled PT for core strength, extension, and correct lateral shift.      Rehab Potential  Excellent    PT Frequency  2x / week    PT  Duration  -- 5 weeks    PT Treatment/Interventions  Iontophoresis 4mg /ml Dexamethasone;Cryotherapy;Electrical Stimulation;Moist Heat;Traction;Ultrasound;Therapeutic exercise;Therapeutic activities;Neuromuscular re-education;Patient/family education;Manual techniques    PT Next Visit Plan  review HEP, core strength, continue taping, neural tension stretch on the Rt    Consulted and Agree with Plan of Care  Patient       Patient will benefit from skilled therapeutic intervention in order to improve the following deficits and impairments:  Increased fascial restricitons, Pain, Decreased mobility, Increased muscle spasms, Decreased strength, Decreased range of motion, Decreased endurance,  Decreased activity tolerance, Impaired flexibility  Visit Diagnosis: Acute right-sided low back pain with right-sided sciatica  Muscle weakness (generalized)  Muscle spasm of back     Problem List Patient Active Problem List   Diagnosis Date Noted  . Acute paronychia of toe of right foot 08/03/2017  . Left shoulder pain 06/25/2017  . Need for immunization against influenza 06/22/2017  . Dyspnea on exertion 05/14/2017  . Personal history of PE (pulmonary embolism) 05/14/2017  . Erectile dysfunction 10/11/2016  . Daytime somnolence 02/20/2016  . Advanced care planning/counseling discussion 02/20/2016  . Cough 09/14/2015  . Back pain 07/26/2015  . Flatulence 05/13/2015  . 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 Trigger thumb of right hand 05/08/2012  . Varicose veins of lower extremities with other complications 24/23/5361  . DEGENERATIVE JOINT DISEASE, KNEE 07/09/2008  . Diabetes mellitus with neuropathy ([place]) 07/03/2008  . Hyperlipemia 07/03/2008  . Human immunodeficiency virus (HIV) disease ([place]) 03/01/2007  . Hypertension 03/01/2007  . GERD 03/01/2007     Sigurd Sos, PT 12/03/17 8:46 AM  [facility] [address] [place], [place], [address] Phone: [phone]   Fax:  [phone]  Name: KHANG HANNUM MRN: [id number] Date of Birth: September 08, 1961

## 2017-12-04 ENCOUNTER — Other Ambulatory Visit: Payer: Self-pay | Admitting: Internal Medicine

## 2017-12-04 DIAGNOSIS — M5441 Lumbago with sciatica, right side: Principal | ICD-10-CM

## 2017-12-04 DIAGNOSIS — G8929 Other chronic pain: Secondary | ICD-10-CM

## 2017-12-04 MED FILL — BACLOFEN 10 MG TABLET: 10 | 30 days supply | Qty: 90 | Fill #0

## 2017-12-04 MED FILL — UNIFINE PENTIPS 8MM 31G: 31G X 8 MM | 33 days supply | Qty: 200 | Fill #8

## 2017-12-05 ENCOUNTER — Ambulatory Visit: Payer: PRIVATE HEALTH INSURANCE

## 2017-12-05 DIAGNOSIS — M5441 Lumbago with sciatica, right side: Secondary | ICD-10-CM

## 2017-12-05 DIAGNOSIS — M6281 Muscle weakness (generalized): Secondary | ICD-10-CM

## 2017-12-05 DIAGNOSIS — M62830 Muscle spasm of back: Secondary | ICD-10-CM

## 2017-12-05 NOTE — Therapy (Signed)
[facility] [address] [place], [place], [address] Phone: [phone]   Fax:  [phone]  Physical Therapy Treatment  Patient Details  Name: Trevor Fischer MRN: [id number] Date of Birth: 1961-04-20 Referring Provider: Dr. Elta Guadeloupe L. Dumonski   Encounter Date: 12/05/2017  PT End of Session - 12/05/17 0847    Visit Number  3    Date for PT Re-Evaluation  01/03/18    Authorization Type  W/C    Authorization - Visit Number  3    Authorization - Number of Visits  9    PT Start Time  0802    PT Stop Time  0849    PT Time Calculation (min)  47 min    Activity Tolerance  Patient tolerated treatment well    Behavior During Therapy  [facility] for tasks assessed/performed       Past Medical History:  Diagnosis Date  . Allergic rhinitis   . Degenerative joint disease of knee   . DVT (deep venous thrombosis) (HCC) RLE X 2   Ended anticoagulation in 2012  . GERD (gastroesophageal reflux disease)   . 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 HIV infection ([place])   . Hx MRSA infection   . Hyperlipidemia   . Hypertension   . Pulmonary embolism ([place]) 05/14/2017  . Superficial thrombophlebitis   . Type II diabetes mellitus ([place]) 9/09    Past Surgical History:  Procedure Laterality Date  . ENDOVENOUS ABLATION SAPHENOUS VEIN W/ LASER  07-17-2011 LEFT GRERATER SAPHENOUS VEIN AND STAB PHLEBECTOMIES   10-20   LEFT LEG  . VEIN LIGATION AND STRIPPING Bilateral     There were no vitals filed for this visit.  Subjective Assessment - 12/05/17 0803    Subjective  No pain this morning.  I had a little pain yesterday (4/10)    Patient Stated Goals  move better and reduce pain    Currently in Pain?  Yes    Pain Score  0-No pain    Pain Location  Back    Pain Orientation  Right                      [facility] Adult PT Treatment/Exercise - 12/05/17 0001      Lumbar Exercises: Aerobic   UBE (Upper Arm Bike)  seated  on blue ball: Level 2x 4 in reverse    Nustep  Level 2 x 8 minutes PT present to discuss progress       Lumbar Exercises: Seated   Other Seated Lumbar Exercises  ER with green band 2x10      Lumbar Exercises: Supine   Ab Set  20 reps;5 seconds    Other Supine Lumbar Exercises  supine on mat: abdominal bracing with green band horizontal abduction      Lumbar Exercises: Sidelying   Clam  Both;20 reps    Clam Limitations  no band      Manual Therapy   Manual Therapy  Taping    McConnell  taped lumbar and low thoracic to keep spine in extension               PT Short Term Goals - 11/29/17 1354      PT SHORT TERM GOAL #1   Title  independent with initial HEP    Time  4    Period  Weeks    Status  New    Target Date  12/27/17  PT SHORT TERM GOAL #2   Title  able to centralize the pain into his lumbar with lateral shifting    Time  4    Period  Weeks    Target Date  12/27/17      PT SHORT TERM GOAL #3   Title  ability to demonstrate correct body mechanics with daily tasks to decrease strain on back    Time  4    Period  Weeks    Status  New    Target Date  12/27/17        PT Long Term Goals - 11/29/17 1355      PT LONG TERM GOAL #1   Title  independent with HEP and understand how to progress himself    Time  5    Period  Weeks    Status  New    Target Date  01/03/18      PT LONG TERM GOAL #2   Title  able to abolish his pain with McKenzie technique     Time  5    Period  Weeks    Status  New    Target Date  01/03/18      PT LONG TERM GOAL #3   Title  able to tolerate work tasks with pain decreased >/= 75% due to abdominal bracing and centralization of pain    Time  5    Period  Weeks    Status  New    Target Date  01/03/18      PT LONG TERM GOAL #4   Title  able to sit without difficulty due to pain minimal to none on bilateral buttocks    Time  5    Period  Weeks    Status  New    Target Date  01/03/18      PT LONG TERM GOAL #5   Title   FOTO score </= 41% limitation    Time  5    Period  Weeks    Status  New    Target Date  01/03/18            Plan - 12/05/17 4098    Clinical Impression Statement  Pt is independent and compliant with HEP.  Pt sits with slumped posture and requires verbal and tactile cues for correction.  Pt has been applying body mechanics education at work and with getting out of bed and car.  Pt reports reduced Rt LE radiculopathy.  He is having difficulty walking long periods due to reduced Rt LE sensation.  Pt with weak core endurance and will continue to benefit from skilled PT for core, strength, extension based exercise and postural strength.      Rehab Potential  Excellent    PT Frequency  2x / week    PT Duration  -- 5 weeks    PT Treatment/Interventions  Iontophoresis 4mg /ml Dexamethasone;Cryotherapy;Electrical Stimulation;Moist Heat;Traction;Ultrasound;Therapeutic exercise;Therapeutic activities;Neuromuscular re-education;Patient/family education;Manual techniques    PT Next Visit Plan  scapular/postural strength, core strength, continue taping, neural tension stretch on the Rt    Consulted and Agree with Plan of Care  Patient       Patient will benefit from skilled therapeutic intervention in order to improve the following deficits and impairments:  Increased fascial restricitons, Pain, Decreased mobility, Increased muscle spasms, Decreased strength, Decreased range of motion, Decreased endurance, Decreased activity tolerance, Impaired flexibility  Visit Diagnosis: Acute right-sided low back pain with right-sided sciatica  Muscle weakness (generalized)  Muscle spasm  of back     Problem List Patient Active Problem List   Diagnosis Date Noted  . Acute paronychia of toe of right foot 08/03/2017  . Left shoulder pain 06/25/2017  . Need for immunization against influenza 06/22/2017  . Dyspnea on exertion 05/14/2017  . Personal history of PE (pulmonary embolism) 05/14/2017  .  Erectile dysfunction 10/11/2016  . Daytime somnolence 02/20/2016  . Advanced care planning/counseling discussion 02/20/2016  . Cough 09/14/2015  . Back pain 07/26/2015  . Flatulence 05/13/2015  . 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 Trigger thumb of right hand 05/08/2012  . Varicose veins of lower extremities with other complications 51/11/5850  . DEGENERATIVE JOINT DISEASE, KNEE 07/09/2008  . Diabetes mellitus with neuropathy ([place]) 07/03/2008  . Hyperlipemia 07/03/2008  . Human immunodeficiency virus (HIV) disease ([place]) 03/01/2007  . Hypertension 03/01/2007  . GERD 03/01/2007    Sigurd Sos, PT 12/05/17 8:51 AM  [facility] [address] [place], [place], [address] Phone: [phone]   Fax:  [phone]  Name: Trevor Fischer MRN: [id number] Date of Birth: [birth year]

## 2017-12-07 ENCOUNTER — Ambulatory Visit (INDEPENDENT_AMBULATORY_CARE_PROVIDER_SITE_OTHER): Payer: 59 | Admitting: Internal Medicine

## 2017-12-07 ENCOUNTER — Encounter: Payer: Self-pay | Admitting: Internal Medicine

## 2017-12-07 ENCOUNTER — Other Ambulatory Visit: Payer: Self-pay

## 2017-12-07 VITALS — BP 135/78 | HR 64 | Temp 97.6°F | Ht 74.0 in | Wt 227.7 lb

## 2017-12-07 DIAGNOSIS — E1140 Type 2 diabetes mellitus with diabetic neuropathy, unspecified: Secondary | ICD-10-CM | POA: Diagnosis not present

## 2017-12-07 DIAGNOSIS — M5416 Radiculopathy, lumbar region: Secondary | ICD-10-CM

## 2017-12-07 DIAGNOSIS — M79671 Pain in right foot: Secondary | ICD-10-CM | POA: Diagnosis not present

## 2017-12-07 DIAGNOSIS — Z794 Long term (current) use of insulin: Principal | ICD-10-CM

## 2017-12-07 DIAGNOSIS — M545 Low back pain: Secondary | ICD-10-CM

## 2017-12-07 DIAGNOSIS — M5116 Intervertebral disc disorders with radiculopathy, lumbar region: Secondary | ICD-10-CM

## 2017-12-07 DIAGNOSIS — Z79891 Long term (current) use of opiate analgesic: Secondary | ICD-10-CM

## 2017-12-07 LAB — POCT GLYCOSYLATED HEMOGLOBIN (HGB A1C): Hemoglobin A1C: 7.5

## 2017-12-07 LAB — GLUCOSE, CAPILLARY: Glucose-Capillary: 75 mg/dL (ref 65–99)

## 2017-12-07 NOTE — Progress Notes (Signed)
   CC: Follow up for diabetes, low back pain  HPI:  [name] is a 57 y.o. 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 He has undergone MRI evaluation showing nerve impingement from a large herniation which was evaluated by orthopedic surgery. At this time he is planning to go ahead with ESI and see how symptoms improve. He is taking hydrocodone intermittently for symptom management and there is associated right foot numbness as well as pain.  See problem based assessment and plan below for additional details.  Lumbar radiculopathy, right Significant right leg pain and parasthesia associated with a work injury lifting an approximately 500lb patient. He has been evaluated by orthopedic surgery and MRI currently undergoing ESI and PT for conservative management. At this time he has a stable gait although significant pain walking even moderate distances.  He requests a handicapped parking placard today for which I provided documentation for temporary status, to be reassessed after further management and recovery from his injury.  Diabetes mellitus with neuropathy (HCC) A: Hgb A1c is 7.5% today, above goal. He will likely be receiving intermittent steroid injections for up to weeks so I will refer to our diabetes coordinator to help follow up his glucose management during this time period.  He was making progress on lifestyle and diet at our previous visit before this injury, so long term we will have to reassess.  P: Ambulatory referral to diabetic educator   Past Medical History:  Diagnosis Date  . Allergic rhinitis   . Degenerative joint disease of knee   . DVT (deep venous thrombosis) (HCC) RLE X 2   Ended anticoagulation in 2012  . GERD (gastroesophageal reflux disease)   . 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 HIV  infection ([place])   . Hx MRSA infection   . Hyperlipidemia   . Hypertension   . Pulmonary embolism ([place]) 05/14/2017  . Superficial thrombophlebitis   . Type II diabetes mellitus ([place]) 9/09    Review of Systems: Review of Systems  Constitutional: Negative for fever.  Eyes: Negative for blurred vision.  Respiratory: Negative for shortness of breath.   Cardiovascular: Negative for leg swelling.  Gastrointestinal: Negative for abdominal pain.  Genitourinary: Negative for dysuria.  Musculoskeletal: Positive for back pain. Negative for falls.  Skin: Negative for rash.  Neurological: Positive for sensory change.     Physical Exam: Vitals:   12/07/17 1450  BP: 135/78  Pulse: 64  Temp: 97.6 F (36.4 C)  TempSrc: Oral  SpO2: 98%  Weight: 227 lb 11.2 oz (103.3 kg)  Height: 6\' 2"  (1.88 m)   GENERAL- alert, co-operative, NAD HEENT- Atraumatic, PERRL, EOMI, oral mucosa appears moist, good and intact dentition, no cervical LN enlargement. CARDIAC- RRR, no murmurs, rubs or gallops. RESP- CTAB, no wheezes or crackles. ABDOMEN- Soft, nontender, no guarding or rebound BACK- Normal curvature, no paraspinal tenderness, no CVA tenderness. NEURO- Strength upper and lower extremities- 5/5, DTRs 2+ b/l, sensation subjectively diminished in R foot but good light touch perception and proprioception EXTREMITIES- pulse 2+, symmetric, no pedal edema. SKIN- Warm, dry, No rash or lesion. PSYCH- Normal mood and affect, appropriate thought content and speech.   Assessment & Plan:   See encounters tab for problem based medical decision making.   Patient discussed with Dr. Lynnae January

## 2017-12-10 DIAGNOSIS — M5416 Radiculopathy, lumbar region: Secondary | ICD-10-CM | POA: Insufficient documentation

## 2017-12-10 HISTORY — DX: Radiculopathy, lumbar region: M54.16

## 2017-12-10 MED FILL — HYDROCODON-APAP 5-325: 5-325 | 13 days supply | Qty: 40 | Fill #0

## 2017-12-10 NOTE — Progress Notes (Signed)
Internal Medicine Clinic Attending  Case discussed with Dr. Rice at the time of the visit.  We reviewed the resident's history and exam and pertinent patient test results.  I agree with the assessment, diagnosis, and plan of care documented in the resident's note.  

## 2017-12-10 NOTE — Assessment & Plan Note (Signed)
A: Hgb A1c is 7.5% today, above goal. He will likely be receiving intermittent steroid injections for up to weeks so I will refer to our diabetes coordinator to help follow up his glucose management during this time period.  He was making progress on lifestyle and diet at our previous visit before this injury, so long term we will have to reassess.  P: Ambulatory referral to diabetic educator

## 2017-12-10 NOTE — Assessment & Plan Note (Addendum)
Significant right leg pain and parasthesia associated with a work injury lifting an approximately 500lb patient. He has been evaluated by orthopedic surgery and MRI currently undergoing ESI and PT for conservative management. At this time he has a stable gait although significant pain walking even moderate distances.  He requests a handicapped parking placard today for which I provided documentation for temporary status, to be reassessed after further management and recovery from his injury.

## 2017-12-11 ENCOUNTER — Telehealth: Payer: Self-pay | Admitting: Internal Medicine

## 2017-12-11 ENCOUNTER — Other Ambulatory Visit: Payer: Self-pay | Admitting: Internal Medicine

## 2017-12-11 ENCOUNTER — Ambulatory Visit: Payer: PRIVATE HEALTH INSURANCE

## 2017-12-11 DIAGNOSIS — M62830 Muscle spasm of back: Secondary | ICD-10-CM

## 2017-12-11 DIAGNOSIS — E1140 Type 2 diabetes mellitus with diabetic neuropathy, unspecified: Secondary | ICD-10-CM

## 2017-12-11 DIAGNOSIS — M5441 Lumbago with sciatica, right side: Secondary | ICD-10-CM | POA: Diagnosis not present

## 2017-12-11 DIAGNOSIS — Z794 Long term (current) use of insulin: Principal | ICD-10-CM

## 2017-12-11 DIAGNOSIS — M6281 Muscle weakness (generalized): Secondary | ICD-10-CM

## 2017-12-11 MED FILL — BIKTARVY 50-200-25 MG TABS: 50-200-25 | 30 days supply | Qty: 30 | Fill #2

## 2017-12-11 MED FILL — ELIQUIS 5 MG TABLET: 5 | 30 days supply | Qty: 60 | Fill #4

## 2017-12-11 MED FILL — HUMALOG 100 UNITS/ML KWIKPE: 100 | 38 days supply | Qty: 15 | Fill #3

## 2017-12-11 MED FILL — VERAPAMIL ER 240 MG TABLET: 240 | 90 days supply | Qty: 90 | Fill #2

## 2017-12-11 NOTE — Therapy (Signed)
[facility] [address], [place], [place], [address] Phone: [phone]   Fax:  [phone]  Physical Therapy Treatment  Patient Details  Name: Trevor Fischer MRN: [id number] Date of Birth: 1960/11/29 Referring Provider: Dr. Elta Guadeloupe L. Dumonski   Encounter Date: 12/11/2017  PT End of Session - 12/11/17 0838    Visit Number  4    Date for PT Re-Evaluation  01/03/18    Authorization Type  W/C    Authorization - Visit Number  4    Authorization - Number of Visits  9    PT Start Time  0800    PT Stop Time  0852    PT Time Calculation (min)  52 min    Activity Tolerance  Patient tolerated treatment well    Behavior During Therapy  [facility] for tasks assessed/performed       Past Medical History:  Diagnosis Date  . Allergic rhinitis   . Degenerative joint disease of knee   . DVT (deep venous thrombosis) (HCC) RLE X 2   Ended anticoagulation in 2012  . GERD (gastroesophageal reflux disease)   . 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 HIV infection ([place])   . Hx MRSA infection   . Hyperlipidemia   . Hypertension   . Pulmonary embolism ([place]) 05/14/2017  . Superficial thrombophlebitis   . Type II diabetes mellitus ([place]) 9/09    Past Surgical History:  Procedure Laterality Date  . ENDOVENOUS ABLATION SAPHENOUS VEIN W/ LASER  07-17-2011 LEFT GRERATER SAPHENOUS VEIN AND STAB PHLEBECTOMIES   10-20   LEFT LEG  . VEIN LIGATION AND STRIPPING Bilateral     There were no vitals filed for this visit.  Subjective Assessment - 12/11/17 0803    Subjective  I didn't get much sleep yesterday and I worked last night.  I feel 40% better since the start of care.    Currently in Pain?  Yes    Pain Score  4     Pain Location  Back    Pain Orientation  Right    Pain Descriptors / Indicators  Aching;Dull    Pain Onset  More than a month ago    Pain Frequency  Constant    Aggravating Factors   walking too much, sitting too  long on hard chair    Pain Relieving Factors  supine, on side                      [facility] Adult PT Treatment/Exercise - 12/11/17 0001      Lumbar Exercises: Aerobic   Nustep  Level 2 x 8 minutes PT present to discuss progress       Lumbar Exercises: Seated   Other Seated Lumbar Exercises  ER with green band 2x10      Lumbar Exercises: Sidelying   Other Sidelying Lumbar Exercises  open book stretch x 10 bil.      Lumbar Exercises: Prone   Straight Leg Raise  10 reps 2x5      Knee/Hip Exercises: Supine   Straight Leg Raises  Strengthening;Both;2 sets;10 reps with abdominal bracing      Modalities   Modalities  Traction      Traction   Type of Traction  Lumbar    Min (lbs)  60    Max (lbs)  110    Hold Time  60    Rest Time  10    Time  15  PT Short Term Goals - 12/11/17 0809      PT SHORT TERM GOAL #1   Title  independent with initial HEP    Status  Achieved      PT SHORT TERM GOAL #2   Title  able to centralize the pain into his lumbar with lateral shifting    Baseline  pt has not been doing    Time  4    Period  Weeks    Status  On-going      PT SHORT TERM GOAL #3   Title  ability to demonstrate correct body mechanics with daily tasks to decrease strain on back    Status  Achieved        PT Long Term Goals - 11/29/17 1355      PT LONG TERM GOAL #1   Title  independent with HEP and understand how to progress himself    Time  5    Period  Weeks    Status  New    Target Date  01/03/18      PT LONG TERM GOAL #2   Title  able to abolish his pain with McKenzie technique     Time  5    Period  Weeks    Status  New    Target Date  01/03/18      PT LONG TERM GOAL #3   Title  able to tolerate work tasks with pain decreased >/= 75% due to abdominal bracing and centralization of pain    Time  5    Period  Weeks    Status  New    Target Date  01/03/18      PT LONG TERM GOAL #4   Title  able to sit without difficulty  due to pain minimal to none on bilateral buttocks    Time  5    Period  Weeks    Status  New    Target Date  01/03/18      PT LONG TERM GOAL #5   Title  FOTO score </= 41% limitation    Time  5    Period  Weeks    Status  New    Target Date  01/03/18            Plan - 12/11/17 1191    Clinical Impression Statement  Pt is working on making postural corrections and is bracing his abdomen during the day.  Pt demonstrates Rt>Lt LE weakness and was challenged by SLR today.  Pt required tactile and verbal cues to reduce substitution with strength exercise.  PT did trial of traction today due to continued Rt LE pain.  Pt will continue to benefit from skilled PT for LE strength, endurance, core strength and traction if helpful.      Rehab Potential  Excellent    PT Frequency  2x / week    PT Duration  -- 5 weeks    PT Treatment/Interventions  Iontophoresis 4mg /ml Dexamethasone;Cryotherapy;Electrical Stimulation;Moist Heat;Traction;Ultrasound;Therapeutic exercise;Therapeutic activities;Neuromuscular re-education;Patient/family education;Manual techniques    PT Next Visit Plan  scapular/postural strength, core strength, continue taping, neural tension stretch on the Rt.  Traction if helpful    Consulted and Agree with Plan of Care  Patient       Patient will benefit from skilled therapeutic intervention in order to improve the following deficits and impairments:  Increased fascial restricitons, Pain, Decreased mobility, Increased muscle spasms, Decreased strength, Decreased range of motion, Decreased endurance, Decreased activity tolerance, Impaired  flexibility  Visit Diagnosis: Acute right-sided low back pain with right-sided sciatica  Muscle weakness (generalized)  Muscle spasm of back     Problem List Patient Active Problem List   Diagnosis Date Noted  . Lumbar radiculopathy, right 12/10/2017  . Acute paronychia of toe of right foot 08/03/2017  . Left shoulder pain 06/25/2017   . Dyspnea on exertion 05/14/2017  . Personal history of PE (pulmonary embolism) 05/14/2017  . Erectile dysfunction 10/11/2016  . Daytime somnolence 02/20/2016  . Advanced care planning/counseling discussion 02/20/2016  . Cough 09/14/2015  . Back pain 07/26/2015  . Flatulence 05/13/2015  . 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 Trigger thumb of right hand 05/08/2012  . Varicose veins of lower extremities with other complications 50/56/9794  . DEGENERATIVE JOINT DISEASE, KNEE 07/09/2008  . Diabetes mellitus with neuropathy ([place]) 07/03/2008  . Hyperlipemia 07/03/2008  . Human immunodeficiency virus (HIV) disease ([place]) 03/01/2007  . Hypertension 03/01/2007  . GERD 03/01/2007     Sigurd Sos, PT 12/11/17 8:41 AM  [facility] [address] [place], [place], [address] Phone: [phone]   Fax:  [phone]  Name: Trevor Fischer MRN: [id number] Date of Birth: 1961-06-05

## 2017-12-11 NOTE — Telephone Encounter (Signed)
He is far enough out that holding it is fine without major risk, I agree with holding it as many times as needed before any injections and can resume the next day.

## 2017-12-11 NOTE — Telephone Encounter (Signed)
Rec'd a Request from [facility] asking for the patient to hold Eliquis for 48 hrs prior to Epidural Injection.  Notes has been placed in your box for review along with this request.  Please advise

## 2017-12-12 MED FILL — VICTOZA 2-PAK 18 MG/3 ML PE: 18 | 30 days supply | Qty: 6 | Fill #0

## 2017-12-13 ENCOUNTER — Ambulatory Visit: Payer: PRIVATE HEALTH INSURANCE

## 2017-12-13 ENCOUNTER — Other Ambulatory Visit: Payer: Self-pay | Admitting: *Deleted

## 2017-12-13 DIAGNOSIS — M5441 Lumbago with sciatica, right side: Secondary | ICD-10-CM

## 2017-12-13 DIAGNOSIS — M62830 Muscle spasm of back: Secondary | ICD-10-CM

## 2017-12-13 DIAGNOSIS — M6281 Muscle weakness (generalized): Secondary | ICD-10-CM

## 2017-12-13 NOTE — Therapy (Signed)
[facility] [address], [place], [place], [address] Phone: [phone]   Fax:  [phone]  Physical Therapy Treatment  Patient Details  Name: Trevor Fischer MRN: [id number] Date of Birth: [birth year] Referring Provider: Dr. Elta Guadeloupe L. Dumonski   Encounter Date: 12/13/2017  PT End of Session - 12/13/17 0846    Visit Number  6    Date for PT Re-Evaluation  01/03/18    Authorization Type  W/C    Authorization - Visit Number  5    Authorization - Number of Visits  9    PT Start Time  0800    PT Stop Time  0852    PT Time Calculation (min)  52 min    Activity Tolerance  Patient tolerated treatment well    Behavior During Therapy  [facility] for tasks assessed/performed       Past Medical History:  Diagnosis Date  . Allergic rhinitis   . Degenerative joint disease of knee   . DVT (deep venous thrombosis) (HCC) RLE X 2   Ended anticoagulation in 2012  . GERD (gastroesophageal reflux disease)   . 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 HIV infection ([place])   . Hx MRSA infection   . Hyperlipidemia   . Hypertension   . Pulmonary embolism ([place]) 05/14/2017  . Superficial thrombophlebitis   . Type II diabetes mellitus ([place]) 9/09    Past Surgical History:  Procedure Laterality Date  . ENDOVENOUS ABLATION SAPHENOUS VEIN W/ LASER  07-17-2011 LEFT GRERATER SAPHENOUS VEIN AND STAB PHLEBECTOMIES   10-20   LEFT LEG  . VEIN LIGATION AND STRIPPING Bilateral     There were no vitals filed for this visit.  Subjective Assessment - 12/13/17 0800    Subjective  I didn't work last night so I feel good.  Traction made me a little bit sore but not bad.  It felt good.    Currently in Pain?  Yes    Pain Score  2     Pain Location  Back                      [facility] Adult PT Treatment/Exercise - 12/13/17 0001      Lumbar Exercises: Stretches   Other Lumbar Stretch Exercise  seated and supine neural tension/glide  exercise x10 each      Lumbar Exercises: Aerobic   Nustep  Level 2 x 8 minutes PT present to discuss progress       Lumbar Exercises: Sidelying   Other Sidelying Lumbar Exercises  open book stretch x 10 bil.      Lumbar Exercises: Prone   Straight Leg Raise  10 reps 2x5      Knee/Hip Exercises: Supine   Straight Leg Raises  Strengthening;Both;2 sets;10 reps with abdominal bracing      Traction   Type of Traction  Lumbar    Min (lbs)  60    Max (lbs)  110    Hold Time  60    Rest Time  10    Time  15             PT Education - 12/13/17 0814    Education provided  Yes    Education Details  nerve flossing    Person(s) Educated  Patient    Methods  Explanation;Demonstration;Handout    Comprehension  Verbalized understanding;Returned demonstration       PT Short Term Goals - 12/11/17  56      PT SHORT TERM GOAL #1   Title  independent with initial HEP    Status  Achieved      PT SHORT TERM GOAL #2   Title  able to centralize the pain into his lumbar with lateral shifting    Baseline  pt has not been doing    Time  4    Period  Weeks    Status  On-going      PT SHORT TERM GOAL #3   Title  ability to demonstrate correct body mechanics with daily tasks to decrease strain on back    Status  Achieved        PT Long Term Goals - 11/29/17 1355      PT LONG TERM GOAL #1   Title  independent with HEP and understand how to progress himself    Time  5    Period  Weeks    Status  New    Target Date  01/03/18      PT LONG TERM GOAL #2   Title  able to abolish his pain with McKenzie technique     Time  5    Period  Weeks    Status  New    Target Date  01/03/18      PT LONG TERM GOAL #3   Title  able to tolerate work tasks with pain decreased >/= 75% due to abdominal bracing and centralization of pain    Time  5    Period  Weeks    Status  New    Target Date  01/03/18      PT LONG TERM GOAL #4   Title  able to sit without difficulty due to pain minimal  to none on bilateral buttocks    Time  5    Period  Weeks    Status  New    Target Date  01/03/18      PT LONG TERM GOAL #5   Title  FOTO score </= 41% limitation    Time  5    Period  Weeks    Status  New    Target Date  01/03/18            Plan - 12/13/17 0814    Clinical Impression Statement  Pt no longer needs to stay in the recliner at work as he isn't having as much pain.  Pt with Rt>Lt LE weakness and is challenged by SLR yet this was easier today.  Pt required tactile and verbal cues to reduce substitution with exercise due to weakness.  Pt responded well to traction so repeated today.  Pt will have injection in 2 weeks.  Pt will continue to benefit from skilled PT for traction, core strength, neural tension and flexibility.      Rehab Potential  Excellent    PT Frequency  2x / week    PT Duration  Other (comment) 5 weeks    PT Treatment/Interventions  Iontophoresis 4mg /ml Dexamethasone;Cryotherapy;Electrical Stimulation;Moist Heat;Traction;Ultrasound;Therapeutic exercise;Therapeutic activities;Neuromuscular re-education;Patient/family education;Manual techniques    PT Next Visit Plan  scapular/postural strength, core strength, continue taping, neural tension stretch on the Rt.  Traction if helpful    Consulted and Agree with Plan of Care  Patient       Patient will benefit from skilled therapeutic intervention in order to improve the following deficits and impairments:  Increased fascial restricitons, Pain, Decreased mobility, Increased muscle spasms, Decreased strength, Decreased range of motion,  Decreased endurance, Decreased activity tolerance, Impaired flexibility  Visit Diagnosis: Acute right-sided low back pain with right-sided sciatica  Muscle weakness (generalized)  Muscle spasm of back     Problem List Patient Active Problem List   Diagnosis Date Noted  . Lumbar radiculopathy, right 12/10/2017  . Acute paronychia of toe of right foot 08/03/2017  .  Left shoulder pain 06/25/2017  . Dyspnea on exertion 05/14/2017  . Personal history of PE (pulmonary embolism) 05/14/2017  . Erectile dysfunction 10/11/2016  . Daytime somnolence 02/20/2016  . Advanced care planning/counseling discussion 02/20/2016  . Cough 09/14/2015  . Back pain 07/26/2015  . Flatulence 05/13/2015  . 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 Trigger thumb of right hand 05/08/2012  . Varicose veins of lower extremities with other complications 50/51/8335  . DEGENERATIVE JOINT DISEASE, KNEE 07/09/2008  . Diabetes mellitus with neuropathy ([place]) 07/03/2008  . Hyperlipemia 07/03/2008  . Human immunodeficiency virus (HIV) disease ([place]) 03/01/2007  . Hypertension 03/01/2007  . GERD 03/01/2007     Sigurd Sos, PT 12/13/17 8:49 AM  [facility] [address], [place], [place], [address] Phone: [phone]   Fax:  [phone]  Name: Trevor Fischer MRN: [id number] Date of Birth: [birth year]

## 2017-12-13 NOTE — Patient Instructions (Addendum)
SLR SURAL NERVE: Flossing I    Lie on back, hands behind right thigh, knee straight, toes pulled up and in. Simultaneously tuck chin to chest and point toes down and out. Do __2_ sets of __10_ repetitions per session. Do _2__ sessions per day.  Copyright  VHI. All rights reserved.  Lower Limb Neural Tension (Sitting)    Feet on floor, hands behind back, slump forward, bending neck. Straighten right knee until stretch is felt. Move foot up and down. Repeat _10___ times per set. Do _3-4___ sets per session. Do __2__ sessions per day.  http://orth.exer.us/282   Copyright  VHI. All rights reserved.  Straight Leg Raise    Bend one leg. Raise other leg ____ inches with knee locked. Exhale and tighten thigh muscles while raising leg. Repeat with other leg. Repeat __5__ times.  Do 2 sets on each leg.  Do _1-2 ___ sessions per day.  http://gt2.exer.us/269   Copyright  VHI. All rights reserved.   [facility] [address], [place], [place] [address] Phone # [phone] Fax [phone]

## 2017-12-14 ENCOUNTER — Encounter: Payer: PRIVATE HEALTH INSURANCE | Admitting: Dietician

## 2017-12-14 ENCOUNTER — Ambulatory Visit: Payer: PRIVATE HEALTH INSURANCE | Admitting: Dietician

## 2017-12-14 ENCOUNTER — Other Ambulatory Visit: Payer: Self-pay | Admitting: Dietician

## 2017-12-14 ENCOUNTER — Encounter: Payer: Self-pay | Admitting: Dietician

## 2017-12-14 ENCOUNTER — Ambulatory Visit (INDEPENDENT_AMBULATORY_CARE_PROVIDER_SITE_OTHER): Payer: 59 | Admitting: Dietician

## 2017-12-14 DIAGNOSIS — Z713 Dietary counseling and surveillance: Secondary | ICD-10-CM | POA: Diagnosis not present

## 2017-12-14 DIAGNOSIS — Z794 Long term (current) use of insulin: Principal | ICD-10-CM

## 2017-12-14 DIAGNOSIS — E1140 Type 2 diabetes mellitus with diabetic neuropathy, unspecified: Secondary | ICD-10-CM

## 2017-12-14 DIAGNOSIS — E109 Type 1 diabetes mellitus without complications: Secondary | ICD-10-CM | POA: Diagnosis not present

## 2017-12-14 MED ORDER — FREESTYLE LIBRE 14 DAY SENSOR MISC: 1.0000 | Freq: Four times a day (QID) | 12 refills | Status: DC

## 2017-12-14 MED ORDER — FREESTYLE LIBRE 14 DAY READER DEVI: 1.0000 | Freq: Four times a day (QID) | 0 refills | Status: DC

## 2017-12-14 MED ORDER — INSULIN PEN NEEDLE 32G X 4 MM MISC: 12 refills | Status: DC

## 2017-12-14 MED ORDER — SM ALCOHOL PREP 70 % PADS: MEDICATED_PAD | 2 refills | Status: DC

## 2017-12-14 MED FILL — ULTICARE ALCOHOL SWABS 70 %: 70 | 25 days supply | Qty: 100 | Fill #0

## 2017-12-14 NOTE — Progress Notes (Signed)
Diabetes Self-Management Education  Visit Type: (P) First/Initial  Appt. Start Time: 1015 Appt. End Time: 1100  12/14/2017  Trevor Fischer, identified by name and date of birth, is a 57 y.o. male with a diagnosis of Diabetes: (P) Type 1.   ASSESSMENT  Lab Results  Component Value Date   HGBA1C 7.5 12/07/2017   Estimated body mass index is 29.23 kg/m as calculated from the following:   Height as of 12/07/17: 6\' 2"  (1.88 m).   Weight as of 12/07/17: 227 lb 11.2 oz (103.3 kg).   Diabetes Self-Management Education - 12/14/17 1100      Visit Information   Visit Type  First/Initial  (Pended)       Initial Visit   Diabetes Type  Type 1  (Pended)     Are you currently following a meal plan?  Yes  (Pended)     What type of meal plan do you follow?  healthy fats. lower carb  (Pended)     Are you taking your medications as prescribed?  Yes  (Pended)       Health Coping   How would you rate your overall health?  Good  (Pended)       Psychosocial Assessment   Patient Belief/Attitude about Diabetes  Motivated to manage diabetes  (Pended)     Self-care barriers  None  (Pended)     Self-management support  Doctor's office;Family;CDE visits  (Pended)     Patient Concerns  Glycemic Control  (Pended)     Special Needs  None  (Pended)     Preferred Learning Style  No preference indicated  (Pended)     Learning Readiness  Ready  (Pended)     How often do you need to have someone help you when you read instructions, pamphlets, or other written materials from your doctor or pharmacy?  2 - Rarely  (Pended)     What is the last grade level you completed in school?  16  (Pended)       Pre-Education Assessment   Patient understands the diabetes disease and treatment process.  Demonstrates understanding / competency  (Pended)     Patient understands incorporating nutritional management into lifestyle.  Demonstrates understanding / competency  (Pended)     Patient undertands incorporating  physical activity into lifestyle.  Demonstrates understanding / competency  (Pended)     Patient understands using medications safely.  Needs Review  (Pended)     Patient understands monitoring blood glucose, interpreting and using results  Demonstrates understanding / competency  (Pended)     Patient understands prevention, detection, and treatment of acute complications.  Demonstrates understanding / competency  (Pended)     Patient understands prevention, detection, and treatment of chronic complications.  Demonstrates understanding / competency  (Pended)     Patient understands how to develop strategies to address psychosocial issues.  Demonstrates understanding / competency  (Pended)     Patient understands how to develop strategies to promote health/change behavior.  Demonstrates understanding / competency  (Pended)       Complications   Last HgB A1C per patient/outside source  7.5 %  (Pended)     How often do you check your blood sugar?  > 4 times/day  (Pended)     Fasting Blood glucose range (mg/dL)  --  (Pended)  he did not bring his meter    Have you had a dilated eye exam in the past 12 months?  Yes  (Pended)  Have you had a dental exam in the past 12 months?  Yes  (Pended)     Are you checking your feet?  Yes  (Pended)        Individualized Plan for Diabetes Self-Management Training:   Learning Objective:  Patient will have a greater understanding of diabetes self-management. Patient education plan is to attend individual and/or group sessions per assessed needs and concerns.   Plan:   Patient Instructions  A correction insulin for when blood sugars are higher than you want for any reason:  Check blood sugar before each meal. For blood sugar less than 150- take regular mealtime insulin For blood sugar 151-200  Add  2 units  To food insulin For blood sugar 201 - 250 Add 4 units  For blood sugar 251 - 300 Add 6 units  For blood sugar 301 - 350 Add 8 units  For blood  sugar 351 - 400  Add 10 units  For blood sugar 401 - 450 Add 12 units  For blood sugar 451 - 500 Add 14 units  For blood sugar over 501  Add 16 units   call office, if office is closed call after hours resident for instructions.    Expected Outcomes:     Education material provided: avs  If problems or questions, patient to contact team via:  Phone and Email  Future DSME appointment:   yearly and as needed Trevor Fischer, RD 12/14/2017 11:32 AM.

## 2017-12-14 NOTE — Patient Instructions (Signed)
A correction insulin for when blood sugars are higher than you want for any reason:  Check blood sugar before each meal. For blood sugar less than 150- take regular mealtime insulin For blood sugar 151-200  Add  2 units  To food insulin For blood sugar 201 - 250 Add 4 units  For blood sugar 251 - 300 Add 6 units  For blood sugar 301 - 350 Add 8 units  For blood sugar 351 - 400  Add 10 units  For blood sugar 401 - 450 Add 12 units  For blood sugar 451 - 500 Add 14 units  For blood sugar over 501  Add 16 units   call office, if office is closed call after hours resident for instructions.

## 2017-12-14 NOTE — Patient Instructions (Signed)
Use the Correction Insulin scale below when blood sugars are higher than desired from steroids, illness, food etc...  Check blood sugar before each meal. For blood sugar <150, take usual meal time insulin  For Blood sugar 151-200 add 2 units Humalog to your usual meal time Humalog For blood sugar 200 - 250 add 4 units  For blood sugar 251 - 300 add 6 units  For blood sugar 301 - 350 add  8 units  For blood sugar 351 - 400  add 10 units  For blood sugar 401 - 450 add 12 units  For blood sugar 451 - 500 add 14 units  For blood sugar over 501 add 16 units   and call office, if office is closed call after hours resident for instructions.

## 2017-12-14 NOTE — Progress Notes (Signed)
Diabetes Self-Management Education  Visit Type:    Appt. Start Time: 1046 Appt. End Time: 1120  12/14/2017  [name], identified by name and date of birth, is a 57 y.o. male with a diagnosis of Diabetes:  .   ASSESSMENT  There were no vitals taken for this visit. There is no height or weight on file to calculate BMI.  Diabetes Self-Management Education - 12/14/17 1100      Visit Information   Visit Type  First/Initial      Initial Visit   Diabetes Type  Type 1    Are you currently following a meal plan?  Yes    What type of meal plan do you follow?  healthy fats. lower carb    Are you taking your medications as prescribed?  Yes      Health Coping   How would you rate your overall health?  Good      Psychosocial Assessment   Patient Belief/Attitude about Diabetes  Motivated to manage diabetes    Self-care barriers  None    Self-management support  Doctor's office;Family;CDE visits    Patient Concerns  Glycemic Control    Special Needs  None    Preferred Learning Style  No preference indicated    Learning Readiness  Ready    How often do you need to have someone help you when you read instructions, pamphlets, or other written materials from your doctor or pharmacy?  2 - Rarely    What is the last grade level you completed in school?  16      Pre-Education Assessment   Patient understands the diabetes disease and treatment process.  Demonstrates understanding / competency    Patient understands incorporating nutritional management into lifestyle.  Demonstrates understanding / competency    Patient undertands incorporating physical activity into lifestyle.  Demonstrates understanding / competency    Patient understands using medications safely.  Needs Review    Patient understands monitoring blood glucose, interpreting and using results  Demonstrates understanding / competency    Patient understands prevention, detection, and treatment of acute complications.   Demonstrates understanding / competency    Patient understands prevention, detection, and treatment of chronic complications.  Demonstrates understanding / competency    Patient understands how to develop strategies to address psychosocial issues.  Demonstrates understanding / competency    Patient understands how to develop strategies to promote health/change behavior.  Demonstrates understanding / competency      Complications   Last HgB A1C per patient/outside source  7.5 %    How often do you check your blood sugar?  > 4 times/day    Fasting Blood glucose range (mg/dL)  -- he did not bring his meter    Have you had a dilated eye exam in the past 12 months?  Yes    Have you had a dental exam in the past 12 months?  Yes    Are you checking your feet?  Yes    How many days per week are you checking your feet?  7      Patient Education   Medications  Reviewed medication adjustment guidelines for hyperglycemia and sick days. for steroid use and other occasssions when blood sugar high      Individualized Goals (developed by patient)   Medications  take my medication as prescribed      Post-Education Assessment   Patient understands using medications safely.  Demonstrates understanding / competency      Outcomes  Expected Outcomes  Demonstrated interest in learning. Expect positive outcomes    Future DMSE  Yearly    Program Status  Completed       Individualized Plan for Diabetes Self-Management Training:   Learning Objective:  Patient will have a greater understanding of diabetes self-management. Patient education plan is to attend individual and/or group sessions per assessed needs and concerns.   Plan:   Patient Instructions  Use the Correction Insulin scale below when blood sugars are higher than desired from steroids, illness, food etc...  Check blood sugar before each meal. For blood sugar <150, take usual meal time insulin  For Blood sugar 151-200 add 2 units Humalog to  your usual meal time Humalog For blood sugar 200 - 250 add 4 units  For blood sugar 251 - 300 add 6 units  For blood sugar 301 - 350 add  8 units  For blood sugar 351 - 400  add 10 units  For blood sugar 401 - 450 add 12 units  For blood sugar 451 - 500 add 14 units  For blood sugar over 501 add 16 units   and call office, if office is closed call after hours resident for instructions.    Expected Outcomes:     Education material provided: avs  If problems or questions, patient to contact team via:  Phone and Email  Future DSME appointment:  yearly or sooner if needed [name], RD 12/14/2017 3:01 PM.

## 2017-12-14 NOTE — Telephone Encounter (Signed)
Patient requests prescriptions for shorter pen needles and freestyle libre.

## 2017-12-17 ENCOUNTER — Ambulatory Visit: Payer: PRIVATE HEALTH INSURANCE

## 2017-12-17 DIAGNOSIS — M62830 Muscle spasm of back: Secondary | ICD-10-CM

## 2017-12-17 DIAGNOSIS — M5441 Lumbago with sciatica, right side: Secondary | ICD-10-CM

## 2017-12-17 DIAGNOSIS — M6281 Muscle weakness (generalized): Secondary | ICD-10-CM

## 2017-12-17 NOTE — Therapy (Signed)
[facility] [address] [place], [place], [address] Phone: [phone]   Fax:  [phone]  Physical Therapy Treatment  Patient Details  Name: Trevor Fischer MRN: [id number] Date of Birth: [birth year] Referring Provider: Dr. Elta Guadeloupe L. Dumonski   Encounter Date: 12/17/2017  PT End of Session - 12/17/17 0827    Visit Number  7    Date for PT Re-Evaluation  01/03/18    Authorization Type  W/C    Authorization - Visit Number  6    Authorization - Number of Visits  9    PT Start Time  0800    PT Stop Time  0901    PT Time Calculation (min)  61 min    Activity Tolerance  Patient tolerated treatment well    Behavior During Therapy  WFL for tasks assessed/performed       Past Medical History:  Diagnosis Date  . Allergic rhinitis   . Degenerative joint disease of knee   . DVT (deep venous thrombosis) (HCC) RLE X 2   Ended anticoagulation in 2012  . GERD (gastroesophageal reflux disease)   . 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 HIV infection ([place])   . Hx MRSA infection   . Hyperlipidemia   . Hypertension   . Pulmonary embolism ([place]) 05/14/2017  . Superficial thrombophlebitis   . Type II diabetes mellitus ([place]) 9/09    Past Surgical History:  Procedure Laterality Date  . ENDOVENOUS ABLATION SAPHENOUS VEIN W/ LASER  07-17-2011 LEFT GRERATER SAPHENOUS VEIN AND STAB PHLEBECTOMIES   10-20   LEFT LEG  . VEIN LIGATION AND STRIPPING Bilateral     There were no vitals filed for this visit.  Subjective Assessment - 12/17/17 0758    Subjective  I had a good night last night.  My back is sore but no sciatic nerve pain.      Patient Stated Goals  move better and reduce pain    Currently in Pain?  Yes    Pain Score  4     Pain Location  Back    Pain Orientation  Right    Pain Descriptors / Indicators  Aching;Dull    Pain Onset  More than a month ago    Pain Frequency  Constant    Aggravating Factors    walking too much, sitting too long on hard chair    Pain Relieving Factors  supine, rest                      [facility] Adult PT Treatment/Exercise - 12/17/17 0001      Lumbar Exercises: Stretches   Other Lumbar Stretch Exercise  --      Lumbar Exercises: Aerobic   Nustep  Level 2 x 8 minutes PT present to discuss progress       Lumbar Exercises: Sidelying   Other Sidelying Lumbar Exercises  open book stretch x 10 bil.      Lumbar Exercises: Prone   Straight Leg Raise  10 reps 2x5      Knee/Hip Exercises: Stretches   Active Hamstring Stretch  Both;3 reps;20 seconds      Knee/Hip Exercises: Supine   Straight Leg Raises  Strengthening;Both;2 sets;10 reps with abdominal bracing      Modalities   Modalities  Electrical Stimulation;Moist Heat      Moist Heat Therapy   Number Minutes Moist Heat  15 Minutes    Moist Heat Location  Lumbar Spine      Electrical Stimulation   Electrical Stimulation Location  bil low back    Electrical Stimulation Action  IFC    Electrical Stimulation Parameters  12 minutes    Electrical Stimulation Goals  Pain      Traction   Type of Traction  Lumbar    Min (lbs)  60    Max (lbs)  110    Hold Time  60    Rest Time  10    Time  15               PT Short Term Goals - 12/11/17 0809      PT SHORT TERM GOAL #1   Title  independent with initial HEP    Status  Achieved      PT SHORT TERM GOAL #2   Title  able to centralize the pain into his lumbar with lateral shifting    Baseline  pt has not been doing    Time  4    Period  Weeks    Status  On-going      PT SHORT TERM GOAL #3   Title  ability to demonstrate correct body mechanics with daily tasks to decrease strain on back    Status  Achieved        PT Long Term Goals - 11/29/17 1355      PT LONG TERM GOAL #1   Title  independent with HEP and understand how to progress himself    Time  5    Period  Weeks    Status  New    Target Date  01/03/18      PT  LONG TERM GOAL #2   Title  able to abolish his pain with McKenzie technique     Time  5    Period  Weeks    Status  New    Target Date  01/03/18      PT LONG TERM GOAL #3   Title  able to tolerate work tasks with pain decreased >/= 75% due to abdominal bracing and centralization of pain    Time  5    Period  Weeks    Status  New    Target Date  01/03/18      PT LONG TERM GOAL #4   Title  able to sit without difficulty due to pain minimal to none on bilateral buttocks    Time  5    Period  Weeks    Status  New    Target Date  01/03/18      PT LONG TERM GOAL #5   Title  FOTO score </= 41% limitation    Time  5    Period  Weeks    Status  New    Target Date  01/03/18            Plan - 12/17/17 0811    Clinical Impression Statement  Pt reports that sciatic nerve pain is now gone.  Pt with increased LBP over the past few days. Pt is challenged by LE strength exercises and requires tactile and demo cues to reduce substitution due to weakness.  Pt responded well to traction and electrical stimulation.  Pt will continue to benefit from skilled PT for traction, core strength, neural tension and flexibility.      PT Frequency  2x / week    PT Duration  -- 5 weeks    PT Treatment/Interventions  Iontophoresis  4mg /ml Dexamethasone;Cryotherapy;Electrical Stimulation;Moist Heat;Traction;Ultrasound;Therapeutic exercise;Therapeutic activities;Neuromuscular re-education;Patient/family education;Manual techniques    PT Next Visit Plan  scapular/postural strength, core strength, continue taping, neural tension stretch on the Rt.  Traction if helpful.  Home TENs if helpful    Consulted and Agree with Plan of Care  Patient       Patient will benefit from skilled therapeutic intervention in order to improve the following deficits and impairments:  Increased fascial restricitons, Pain, Decreased mobility, Increased muscle spasms, Decreased strength, Decreased range of motion, Decreased  endurance, Decreased activity tolerance, Impaired flexibility  Visit Diagnosis: Acute right-sided low back pain with right-sided sciatica  Muscle weakness (generalized)  Muscle spasm of back     Problem List Patient Active Problem List   Diagnosis Date Noted  . Lumbar radiculopathy, right 12/10/2017  . Acute paronychia of toe of right foot 08/03/2017  . Left shoulder pain 06/25/2017  . Dyspnea on exertion 05/14/2017  . Personal history of PE (pulmonary embolism) 05/14/2017  . Erectile dysfunction 10/11/2016  . Daytime somnolence 02/20/2016  . Advanced care planning/counseling discussion 02/20/2016  . Cough 09/14/2015  . Back pain 07/26/2015  . Flatulence 05/13/2015  . 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 Trigger thumb of right hand 05/08/2012  . Varicose veins of lower extremities with other complications 84/11/7541  . DEGENERATIVE JOINT DISEASE, KNEE 07/09/2008  . Diabetes mellitus with neuropathy ([place]) 07/03/2008  . Hyperlipemia 07/03/2008  . Human immunodeficiency virus (HIV) disease ([place]) 03/01/2007  . Hypertension 03/01/2007  . GERD 03/01/2007    TAKACS,KELLY 12/17/2017, 8:29 AM  [facility] [address] [place], [place], [address] Phone: [phone]   Fax:  [phone]  Name: Trevor Fischer MRN: [id number] Date of Birth: 02-26-1961

## 2017-12-17 NOTE — Telephone Encounter (Signed)
Faxed this message to [place].

## 2017-12-18 MED FILL — ACCU-CHEK GUIDE STRP: 25 days supply | Qty: 100 | Fill #1

## 2017-12-19 ENCOUNTER — Ambulatory Visit: Payer: PRIVATE HEALTH INSURANCE

## 2017-12-19 DIAGNOSIS — M5441 Lumbago with sciatica, right side: Secondary | ICD-10-CM | POA: Diagnosis not present

## 2017-12-19 DIAGNOSIS — M6281 Muscle weakness (generalized): Secondary | ICD-10-CM

## 2017-12-19 DIAGNOSIS — M62830 Muscle spasm of back: Secondary | ICD-10-CM

## 2017-12-19 NOTE — Therapy (Addendum)
[facility] [address], [place], [place], [address] Phone: [phone]   Fax:  [phone]  Physical Therapy Treatment  Patient Details  Name: Trevor Fischer MRN: [id number] Date of Birth: Aug 07, 1961 Referring Provider: Dr. Elta Guadeloupe L. Dumonski   Encounter Date: 12/19/2017  PT End of Session - 12/19/17 0845    Visit Number  8    Date for PT Re-Evaluation  01/18/18    Authorization Type  W/C    Authorization - Visit Number  8    Authorization - Number of Visits  9    PT Start Time  0800    PT Stop Time  0900    PT Time Calculation (min)  60 min    Activity Tolerance  Treatment limited secondary to medical complications (Comment)    Behavior During Therapy  [facility] for tasks assessed/performed       Past Medical History:  Diagnosis Date  . Allergic rhinitis   . Degenerative joint disease of knee   . DVT (deep venous thrombosis) (HCC) RLE X 2   Ended anticoagulation in 2012  . GERD (gastroesophageal reflux disease)   . 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 HIV infection ([place])   . Hx MRSA infection   . Hyperlipidemia   . Hypertension   . Pulmonary embolism ([place]) 05/14/2017  . Superficial thrombophlebitis   . Type II diabetes mellitus ([place]) 9/09    Past Surgical History:  Procedure Laterality Date  . ENDOVENOUS ABLATION SAPHENOUS VEIN W/ LASER  07-17-2011 LEFT GRERATER SAPHENOUS VEIN AND STAB PHLEBECTOMIES   10-20   LEFT LEG  . VEIN LIGATION AND STRIPPING Bilateral     There were no vitals filed for this visit.  Subjective Assessment - 12/19/17 0803    Subjective  I didn't work last night.  Still having LBP and feeling like it is still not right.      Currently in Pain?  Yes    Pain Score  3     Pain Location  Back    Pain Orientation  Right         [facility] PT Assessment - 12/19/17 0001      Assessment   Medical Diagnosis  Lumbar radiculopathy    Onset Date/Surgical Date  09/21/17      Home Environment   Living Environment  Private residence      Prior Function   Level of Independence  Independent      Cognition   Overall Cognitive Status  Within Functional Limits for tasks assessed      Strength   Right Hip Extension  3+/5    Right Knee Extension  4/5                  [facility] Adult PT Treatment/Exercise - 12/19/17 0001      Lumbar Exercises: Stretches   Other Lumbar Stretch Exercise  seated and supine neural tension/glide exercise x10 each      Lumbar Exercises: Aerobic   UBE (Upper Arm Bike)  seated on blue ball: Level 2x 6 minutes (3/3)    Nustep  Level 2 x 8 minutes PT present to discuss progress       Lumbar Exercises: Sidelying   Other Sidelying Lumbar Exercises  open book stretch x 10 bil.      Knee/Hip Exercises: Standing   Walking with Sports Cord  walking in reverse holding handles- 30# abdominal bracing 2x10      Modalities  Modalities  Electrical Stimulation;Moist Heat      Moist Heat Therapy   Number Minutes Moist Heat  15 Minutes    Moist Heat Location  Lumbar Spine      Manual Therapy   Manual Therapy  Joint mobilization    Manual therapy comments  grade 2 PA mobs L3-5               PT Short Term Goals - 12/19/17 0830      PT SHORT TERM GOAL #2   Title  able to centralize the pain into his lumbar with lateral shifting    Status  Achieved      PT SHORT TERM GOAL #3   Title  ability to demonstrate correct body mechanics with daily tasks to decrease strain on back    Status  Achieved        PT Long Term Goals - 12/19/17 0830      PT LONG TERM GOAL #1   Title  independent with HEP and understand how to progress himself    Time  4    Period  Weeks    Status  On-going      PT LONG TERM GOAL #2   Title  able to abolish his pain with McKenzie technique     Baseline  60% better    Time  4    Period  Weeks    Status  On-going      PT LONG TERM GOAL #3   Title  able to tolerate work tasks with pain  decreased >/= 75% due to abdominal bracing and centralization of pain    Baseline  60%    Time  4    Period  Weeks    Status  On-going    Target Date  01/16/18      PT LONG TERM GOAL #4   Title  able to sit without difficulty due to pain minimal to none on bilateral buttocks    Baseline  no longer needs to sit in the recliner    Time  4    Period  Weeks    Status  On-going            Plan - 12/19/17 0827    Clinical Impression Statement  Pt report that sciatic nerve pain is gone.  Pt reports that Low back is still painful and feels like it is spasming.  Pt has not returned to gardening per MD recommendation. Pt with improved postural awareness and is making postural corrections at home and work.  Pt is challenged by LE strength exercise and requires and demo cues to reduce substitution due to weakness.  Pt with reduced lumbar spinal mobility.  Pt will continue to benefit from skilled PT for LE strength, core strength, traction, pain management and neural tension.  Pt reports 60% overall improvement since the start of care.      Rehab Potential  Excellent    PT Frequency  2x / week    PT Duration  4 weeks    PT Treatment/Interventions  Iontophoresis 4mg /ml Dexamethasone;Cryotherapy;Electrical Stimulation;Moist Heat;Traction;Ultrasound;Therapeutic exercise;Therapeutic activities;Neuromuscular re-education;Patient/family education;Manual techniques    PT Next Visit Plan  scapular/postural strength, core strength, continue taping, neural tension stretch on the Rt.  Traction if helpful.     Consulted and Agree with Plan of Care  Patient       Patient will benefit from skilled therapeutic intervention in order to improve the following deficits and impairments:  Increased fascial restricitons,  Pain, Decreased mobility, Increased muscle spasms, Decreased strength, Decreased range of motion, Decreased endurance, Decreased activity tolerance, Impaired flexibility  Visit Diagnosis: Acute  right-sided low back pain with right-sided sciatica - Plan: PT plan of care cert/re-cert  Muscle weakness (generalized) - Plan: PT plan of care cert/re-cert  Muscle spasm of back - Plan: PT plan of care cert/re-cert     Problem List Patient Active Problem List   Diagnosis Date Noted  . Lumbar radiculopathy, right 12/10/2017  . Acute paronychia of toe of right foot 08/03/2017  . Left shoulder pain 06/25/2017  . Dyspnea on exertion 05/14/2017  . Personal history of PE (pulmonary embolism) 05/14/2017  . Erectile dysfunction 10/11/2016  . Daytime somnolence 02/20/2016  . Advanced care planning/counseling discussion 02/20/2016  . Cough 09/14/2015  . Back pain 07/26/2015  . Flatulence 05/13/2015  . 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 Trigger thumb of right hand 05/08/2012  . Varicose veins of lower extremities with other complications 37/90/2409  . DEGENERATIVE JOINT DISEASE, KNEE 07/09/2008  . Diabetes mellitus with neuropathy ([place]) 07/03/2008  . Hyperlipemia 07/03/2008  . Human immunodeficiency virus (HIV) disease ([place]) 03/01/2007  . Hypertension 03/01/2007  . GERD 03/01/2007     Sigurd Sos, PT 12/19/17 9:31 AM  [facility] [address] [place], [place], [address] Phone: [phone]   Fax:  [phone]  Name: Trevor Fischer MRN: [id number] Date of Birth: 1960-12-05

## 2017-12-24 ENCOUNTER — Ambulatory Visit: Payer: PRIVATE HEALTH INSURANCE

## 2017-12-24 DIAGNOSIS — M6281 Muscle weakness (generalized): Secondary | ICD-10-CM

## 2017-12-24 DIAGNOSIS — M5441 Lumbago with sciatica, right side: Secondary | ICD-10-CM | POA: Diagnosis not present

## 2017-12-24 DIAGNOSIS — M62830 Muscle spasm of back: Secondary | ICD-10-CM

## 2017-12-24 NOTE — Therapy (Signed)
[facility] [address], [place], [place], [address] Phone: [phone]   Fax:  [phone]  Physical Therapy Treatment  Patient Details  Name: Trevor Fischer MRN: [id number] Date of Birth: January 21, 1961 Referring Provider: Dr. Elta Guadeloupe L. Fischer   Encounter Date: 12/24/2017  PT End of Session - 12/24/17 0829    Visit Number  9    Date for PT Re-Evaluation  01/18/18    Authorization Type  W/C- 17 total approved    Authorization - Visit Number  9    Authorization - Number of Visits  17    PT Start Time  0800    PT Stop Time  0849    PT Time Calculation (min)  49 min    Activity Tolerance  Patient tolerated treatment well    Behavior During Therapy  WFL for tasks assessed/performed       Past Medical History:  Diagnosis Date  . Allergic rhinitis   . Degenerative joint disease of knee   . DVT (deep venous thrombosis) (HCC) RLE X 2   Ended anticoagulation in 2012  . GERD (gastroesophageal reflux disease)   . 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 HIV infection ([place])   . Hx MRSA infection   . Hyperlipidemia   . Hypertension   . Pulmonary embolism ([place]) 05/14/2017  . Superficial thrombophlebitis   . Type II diabetes mellitus ([place]) 9/09    Past Surgical History:  Procedure Laterality Date  . ENDOVENOUS ABLATION SAPHENOUS VEIN W/ LASER  07-17-2011 LEFT GRERATER SAPHENOUS VEIN AND STAB PHLEBECTOMIES   10-20   LEFT LEG  . VEIN LIGATION AND STRIPPING Bilateral     There were no vitals filed for this visit.  Subjective Assessment - 12/24/17 0802    Subjective  I have good days and bad days.  My Rt foot is still numb.  I am scheduled for injection 01/02/18.      Diagnostic tests  MRI showed 2 bulging disc on a nerve    Currently in Pain?  Yes    Pain Score  4     Pain Location  Back    Pain Orientation  Right    Pain Descriptors / Indicators  Aching;Dull    Pain Onset  More than a month ago    Pain  Frequency  Constant    Aggravating Factors   walking a lot    Pain Relieving Factors  pain medication, Gabapentin                No data recorded       [facility] Adult PT Treatment/Exercise - 12/24/17 0001      Lumbar Exercises: Aerobic   Nustep  Level 2 x10 minutes PT present to discuss progress       Lumbar Exercises: Sidelying   Other Sidelying Lumbar Exercises  open book stretch x 10 bil.      Lumbar Exercises: Prone   Straight Leg Raise  10 reps 2x5    Other Prone Lumbar Exercises  prone on elbows x 3 minutes, prone press x 10      Modalities   Modalities  --      Moist Heat Therapy   Number Minutes Moist Heat  --    Moist Heat Location  --      Acupuncturist Location  --    Electrical Stimulation Action  --    Electrical Stimulation Parameters  --  Electrical Stimulation Goals  --      Traction   Type of Traction  Lumbar    Min (lbs)  60    Max (lbs)  110    Hold Time  60    Rest Time  10    Time  15               PT Short Term Goals - 12/19/17 0830      PT SHORT TERM GOAL #2   Title  able to centralize the pain into his lumbar with lateral shifting    Status  Achieved      PT SHORT TERM GOAL #3   Title  ability to demonstrate correct body mechanics with daily tasks to decrease strain on back    Status  Achieved        PT Long Term Goals - 12/24/17 0807      PT LONG TERM GOAL #2   Title  able to abolish his pain with McKenzie technique     Baseline  60% better    Time  4    Period  Weeks    Status  On-going            Plan - 12/24/17 0808    Clinical Impression Statement  Pt continues to have Rt LE numbness and intermittent sharp pain that is constant.  Pt reports overall improvement in symptoms by 60%.  Pt with reduced spinal segmental mobility and bil hip weakness.  Pt will continue to benefit from skilled PT for traction, electrical stimulation, strength and flexibility to reduce Rt  LE radiculopathy.      Rehab Potential  Excellent    PT Frequency  2x / week    PT Duration  4 weeks    PT Treatment/Interventions  Iontophoresis 4mg /ml Dexamethasone;Cryotherapy;Electrical Stimulation;Moist Heat;Traction;Ultrasound;Therapeutic exercise;Therapeutic activities;Neuromuscular re-education;Patient/family education;Manual techniques    PT Next Visit Plan  scapular/postural strength, core strength, continue taping, neural tension stretch on the Rt.  Traction if helpful.     Recommended Other Services  certification send a 2nd time, recert sent 9/56/38    Consulted and Agree with Plan of Care  Patient       Patient will benefit from skilled therapeutic intervention in order to improve the following deficits and impairments:  Increased fascial restricitons, Pain, Decreased mobility, Increased muscle spasms, Decreased strength, Decreased range of motion, Decreased endurance, Decreased activity tolerance, Impaired flexibility  Visit Diagnosis: Acute right-sided low back pain with right-sided sciatica  Muscle weakness (generalized)  Muscle spasm of back     Problem List Patient Active Problem List   Diagnosis Date Noted  . Lumbar radiculopathy, right 12/10/2017  . Acute paronychia of toe of right foot 08/03/2017  . Left shoulder pain 06/25/2017  . Dyspnea on exertion 05/14/2017  . Personal history of PE (pulmonary embolism) 05/14/2017  . Erectile dysfunction 10/11/2016  . Daytime somnolence 02/20/2016  . Advanced care planning/counseling discussion 02/20/2016  . Cough 09/14/2015  . Back pain 07/26/2015  . Flatulence 05/13/2015  . 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 Trigger thumb of right hand 05/08/2012  . Varicose veins of lower extremities with other complications 75/64/3329  . DEGENERATIVE JOINT DISEASE, KNEE 07/09/2008  . Diabetes mellitus with neuropathy ([place]) 07/03/2008  . Hyperlipemia 07/03/2008  . Human immunodeficiency virus (HIV) disease  ([place]) 03/01/2007  . Hypertension 03/01/2007  . GERD 03/01/2007    Sigurd Sos, PT 12/24/17 8:35 AM  [facility] [address], [place], [place], [address] Phone: [phone]   Fax:  [phone]  Name: Trevor Fischer MRN: [id number] Date of Birth: [birth year]

## 2017-12-25 MED FILL — HYDROCODON-APAP 5-325: 5-325 | 13 days supply | Qty: 40 | Fill #0

## 2017-12-27 ENCOUNTER — Ambulatory Visit: Payer: PRIVATE HEALTH INSURANCE

## 2017-12-27 DIAGNOSIS — M5441 Lumbago with sciatica, right side: Secondary | ICD-10-CM

## 2017-12-27 DIAGNOSIS — M6281 Muscle weakness (generalized): Secondary | ICD-10-CM

## 2017-12-27 DIAGNOSIS — M62830 Muscle spasm of back: Secondary | ICD-10-CM

## 2017-12-27 NOTE — Therapy (Signed)
[facility] [address] [place], [place], [address] Phone: [phone]   Fax:  [phone]  Physical Therapy Treatment  Patient Details  Name: Trevor Fischer MRN: [id number] Date of Birth: Jul 23, 1961 Referring Provider: Dr. Elta Guadeloupe L. Dumonski   Encounter Date: 12/27/2017  PT End of Session - 12/27/17 0831    Visit Number  10    Date for PT Re-Evaluation  01/18/18    Authorization Type  W/C- 17 total approved    Authorization - Visit Number  10    Authorization - Number of Visits  17    PT Start Time  0756    PT Stop Time  0853    PT Time Calculation (min)  57 min    Activity Tolerance  Patient tolerated treatment well    Behavior During Therapy  [facility] for tasks assessed/performed       Past Medical History:  Diagnosis Date  . Allergic rhinitis   . Degenerative joint disease of knee   . DVT (deep venous thrombosis) (HCC) RLE X 2   Ended anticoagulation in 2012  . GERD (gastroesophageal reflux disease)   . 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 HIV infection ([place])   . Hx MRSA infection   . Hyperlipidemia   . Hypertension   . Pulmonary embolism ([place]) 05/14/2017  . Superficial thrombophlebitis   . Type II diabetes mellitus ([place]) 9/09    Past Surgical History:  Procedure Laterality Date  . ENDOVENOUS ABLATION SAPHENOUS VEIN W/ LASER  07-17-2011 LEFT GRERATER SAPHENOUS VEIN AND STAB PHLEBECTOMIES   10-20   LEFT LEG  . VEIN LIGATION AND STRIPPING Bilateral     There were no vitals filed for this visit.  Subjective Assessment - 12/27/17 0820    Subjective  My Rt leg is still numb.  I'm doing OK.    Currently in Pain?  Yes    Pain Score  3     Pain Location  Back    Pain Orientation  Right    Pain Descriptors / Indicators  Aching;Dull    Pain Type  Acute pain    Pain Onset  More than a month ago    Pain Frequency  Constant    Aggravating Factors   walking a lot, activity    Pain Relieving  Factors  pain medication, Gabapentin                No data recorded       [facility] Adult PT Treatment/Exercise - 12/27/17 0001      Lumbar Exercises: Stretches   Other Lumbar Stretch Exercise  seated and supine neural tension/glide exercise x10 each      Lumbar Exercises: Aerobic   UBE (Upper Arm Bike)  seated on blue ball: Level 2x 6 minutes (3/3)    Nustep  Level 2 x10 minutes PT present to discuss progress       Lumbar Exercises: Supine   Bridge  20 reps;5 seconds    Straight Leg Raise  10 reps      Lumbar Exercises: Sidelying   Other Sidelying Lumbar Exercises  open book stretch x 10 bil.      Lumbar Exercises: Prone   Straight Leg Raise  10 reps 2x5    Other Prone Lumbar Exercises  prone on elbows x 3 minutes, prone press x 10      Traction   Type of Traction  Lumbar    Min (lbs)  60    Max (lbs)  120    Hold Time  60    Rest Time  10    Time  15      Manual Therapy   Manual Therapy  Joint mobilization    Manual therapy comments  grade 2-3 PA mobs L3-5               PT Short Term Goals - 12/19/17 0830      PT SHORT TERM GOAL #2   Title  able to centralize the pain into his lumbar with lateral shifting    Status  Achieved      PT SHORT TERM GOAL #3   Title  ability to demonstrate correct body mechanics with daily tasks to decrease strain on back    Status  Achieved        PT Long Term Goals - 12/24/17 0807      PT LONG TERM GOAL #2   Title  able to abolish his pain with McKenzie technique     Baseline  60% better    Time  4    Period  Weeks    Status  On-going            Plan - 12/27/17 8546    Clinical Impression Statement  Pt continues to have Rt LE numbness and intermittent sharp pain.  Pt has worked 6 days in a row so increased pain this week. Pt demonstrates improved spinal segmental mobility in the lumbar spine with reduced pain today.  Pt reports overall improvement in symptoms by 60%.  Pt with reduced spinal  segmental mobility and bil hip weakness.  Pt will continue to benefit from skilled PT for traction, electrical stimulation, strength, flexibility and reduce Rt LE radiculopathy.      Rehab Potential  Excellent    PT Frequency  2x / week    PT Duration  4 weeks    PT Treatment/Interventions  Iontophoresis 4mg /ml Dexamethasone;Cryotherapy;Electrical Stimulation;Moist Heat;Traction;Ultrasound;Therapeutic exercise;Therapeutic activities;Neuromuscular re-education;Patient/family education;Manual techniques    PT Next Visit Plan  scapular/postural strength, core strength, continue taping, neural tension stretch on the Rt.  Traction if helpful.     Consulted and Agree with Plan of Care  Patient       Patient will benefit from skilled therapeutic intervention in order to improve the following deficits and impairments:  Increased fascial restricitons, Pain, Decreased mobility, Increased muscle spasms, Decreased strength, Decreased range of motion, Decreased endurance, Decreased activity tolerance, Impaired flexibility  Visit Diagnosis: Acute right-sided low back pain with right-sided sciatica  Muscle weakness (generalized)  Muscle spasm of back     Problem List Patient Active Problem List   Diagnosis Date Noted  . Lumbar radiculopathy, right 12/10/2017  . Acute paronychia of toe of right foot 08/03/2017  . Left shoulder pain 06/25/2017  . Dyspnea on exertion 05/14/2017  . Personal history of PE (pulmonary embolism) 05/14/2017  . Erectile dysfunction 10/11/2016  . Daytime somnolence 02/20/2016  . Advanced care planning/counseling discussion 02/20/2016  . Cough 09/14/2015  . Back pain 07/26/2015  . Flatulence 05/13/2015  . 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 Trigger thumb of right hand 05/08/2012  . Varicose veins of lower extremities with other complications 27/12/5007  . DEGENERATIVE JOINT DISEASE, KNEE 07/09/2008  . Diabetes mellitus with neuropathy ([place]) 07/03/2008   . Hyperlipemia 07/03/2008  . Human immunodeficiency virus (HIV) disease ([place]) 03/01/2007  . Hypertension 03/01/2007  . GERD 03/01/2007   Sigurd Sos, PT  12/27/17 8:38 AM  [facility] [address], [place], [place], [address] Phone: [phone]   Fax:  [phone]  Name: Trevor Fischer MRN: [id number] Date of Birth: 1961/02/08

## 2017-12-31 ENCOUNTER — Ambulatory Visit: Payer: PRIVATE HEALTH INSURANCE | Attending: Orthopedic Surgery

## 2017-12-31 DIAGNOSIS — M6281 Muscle weakness (generalized): Secondary | ICD-10-CM | POA: Diagnosis present

## 2017-12-31 DIAGNOSIS — M5441 Lumbago with sciatica, right side: Secondary | ICD-10-CM | POA: Insufficient documentation

## 2017-12-31 DIAGNOSIS — M62830 Muscle spasm of back: Secondary | ICD-10-CM | POA: Insufficient documentation

## 2017-12-31 NOTE — Therapy (Signed)
[facility] [address] [place], [place], [address] Phone: [phone]   Fax:  [phone]  Physical Therapy Treatment  Patient Details  Name: Trevor Fischer MRN: [id number] Date of Birth: 1960-12-21 Referring Provider: Dr. Elta Guadeloupe L. Dumonski   Encounter Date: 12/31/2017  PT End of Session - 12/31/17 0846    Visit Number  11    Authorization Type  W/C- 17 total approved    Authorization - Visit Number  11    Authorization - Number of Visits  17    PT Start Time  0800    PT Stop Time  0859    PT Time Calculation (min)  59 min    Activity Tolerance  Patient tolerated treatment well    Behavior During Therapy  [facility] for tasks assessed/performed       Past Medical History:  Diagnosis Date  . Allergic rhinitis   . Degenerative joint disease of knee   . DVT (deep venous thrombosis) (HCC) RLE X 2   Ended anticoagulation in 2012  . GERD (gastroesophageal reflux disease)   . 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 HIV infection ([place])   . Hx MRSA infection   . Hyperlipidemia   . Hypertension   . Pulmonary embolism ([place]) 05/14/2017  . Superficial thrombophlebitis   . Type II diabetes mellitus ([place]) 9/09    Past Surgical History:  Procedure Laterality Date  . ENDOVENOUS ABLATION SAPHENOUS VEIN W/ LASER  07-17-2011 LEFT GRERATER SAPHENOUS VEIN AND STAB PHLEBECTOMIES   10-20   LEFT LEG  . VEIN LIGATION AND STRIPPING Bilateral     There were no vitals filed for this visit.  Subjective Assessment - 12/31/17 0759    Subjective  I am feeling about the same.  I worked all weekend.  I took pain medication so my pain is now lower.      Diagnostic tests  MRI showed 2 bulging disc on a nerve    Patient Stated Goals  move better and reduce pain    Currently in Pain?  Yes    Pain Score  3     Pain Location  Back    Pain Orientation  Right    Pain Descriptors / Indicators  Aching;Dull    Pain Type  Acute pain    Pain Onset  More than a month ago    Pain Frequency  Constant    Aggravating Factors   work, walking, activity    Pain Relieving Factors  pain medication, Gabapentin         [facility] PT Assessment - 12/31/17 0001      Observation/Other Assessments   Focus on Therapeutic Outcomes (FOTO)   43% limitation            No data recorded       [facility] Adult PT Treatment/Exercise - 12/31/17 0001      Lumbar Exercises: Stretches   Other Lumbar Stretch Exercise  seated and supine neural tension/glide exercise x10 each      Lumbar Exercises: Aerobic   Recumbent Bike  level 2x 8 minutes PT present for FOTO survey      Lumbar Exercises: Supine   Bridge  20 reps;5 seconds    Straight Leg Raise  10 reps      Lumbar Exercises: Prone   Straight Leg Raise  10 reps 2x5    Other Prone Lumbar Exercises  prone on elbows x 3 minutes, prone press x 10  Traction   Type of Traction  Lumbar    Min (lbs)  60    Max (lbs)  120    Hold Time  60    Rest Time  10    Time  15               PT Short Term Goals - 12/19/17 0830      PT SHORT TERM GOAL #2   Title  able to centralize the pain into his lumbar with lateral shifting    Status  Achieved      PT SHORT TERM GOAL #3   Title  ability to demonstrate correct body mechanics with daily tasks to decrease strain on back    Status  Achieved        PT Long Term Goals - 12/31/17 0805      PT LONG TERM GOAL #2   Title  able to abolish his pain with McKenzie technique     Baseline  60% better    Time  4    Period  Weeks    Status  On-going      PT LONG TERM GOAL #5   Title  FOTO score </= 41% limitation    Baseline  43%    Time  5    Period  Weeks    Status  On-going            Plan - 12/31/17 0810    Clinical Impression Statement  Pt feels as if he has reached a plateau with his Rt LE radiculopathy.  Injection is scheduled for 01/02/18.  FOTO is improved to 43% limitation (54% limitation at evaluation).  Pt  remains weak in the hips and core and continues to work on this at home.  Pt will continue to benefit from skilled PT for strength, flexibility and traction.      Rehab Potential  Excellent    PT Frequency  2x / week    PT Duration  4 weeks    PT Treatment/Interventions  Iontophoresis 4mg /ml Dexamethasone;Cryotherapy;Electrical Stimulation;Moist Heat;Traction;Ultrasound;Therapeutic exercise;Therapeutic activities;Neuromuscular re-education;Patient/family education;Manual techniques    PT Next Visit Plan  See how injection helps.  scapular/postural strength, core strength, continue taping, neural tension stretch on the Rt.  Traction if helpful.     Consulted and Agree with Plan of Care  Patient       Patient will benefit from skilled therapeutic intervention in order to improve the following deficits and impairments:  Increased fascial restricitons, Pain, Decreased mobility, Increased muscle spasms, Decreased strength, Decreased range of motion, Decreased endurance, Decreased activity tolerance, Impaired flexibility  Visit Diagnosis: Acute right-sided low back pain with right-sided sciatica  Muscle weakness (generalized)  Muscle spasm of back     Problem List Patient Active Problem List   Diagnosis Date Noted  . Lumbar radiculopathy, right 12/10/2017  . Acute paronychia of toe of right foot 08/03/2017  . Left shoulder pain 06/25/2017  . Dyspnea on exertion 05/14/2017  . Personal history of PE (pulmonary embolism) 05/14/2017  . Erectile dysfunction 10/11/2016  . Daytime somnolence 02/20/2016  . Advanced care planning/counseling discussion 02/20/2016  . Cough 09/14/2015  . Back pain 07/26/2015  . Flatulence 05/13/2015  . 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 Trigger thumb of right hand 05/08/2012  . Varicose veins of lower extremities with other complications 27/78/2423  . DEGENERATIVE JOINT DISEASE, KNEE 07/09/2008  . Diabetes mellitus with neuropathy ([place])  07/03/2008  . Hyperlipemia 07/03/2008  . Human  immunodeficiency virus (HIV) disease ([place]) 03/01/2007  . Hypertension 03/01/2007  . GERD 03/01/2007    Sigurd Sos, PT 12/31/17 8:51 AM  [facility] [address] [place], [place], [address] Phone: [phone]   Fax:  [phone]  Name: Trevor Fischer MRN: [id number] Date of Birth: 02-04-1961

## 2018-01-01 MED FILL — PANTOPRAZOLE SOD DR 40 MG T: 40 | 90 days supply | Qty: 90 | Fill #1

## 2018-01-01 MED FILL — ATORVASTATIN 40 MG TABLET: 40 | 90 days supply | Qty: 90 | Fill #1

## 2018-01-09 ENCOUNTER — Ambulatory Visit: Payer: PRIVATE HEALTH INSURANCE

## 2018-01-09 DIAGNOSIS — M6281 Muscle weakness (generalized): Secondary | ICD-10-CM

## 2018-01-09 DIAGNOSIS — M5441 Lumbago with sciatica, right side: Secondary | ICD-10-CM | POA: Diagnosis not present

## 2018-01-09 DIAGNOSIS — M62830 Muscle spasm of back: Secondary | ICD-10-CM

## 2018-01-09 MED FILL — ULTICARE ALCOHOL SWABS 70 %: 70 | 25 days supply | Qty: 100 | Fill #1

## 2018-01-09 MED FILL — BACLOFEN 10 MG TABLET: 10 | 30 days supply | Qty: 90 | Fill #1

## 2018-01-09 MED FILL — VICTOZA 2-PAK 18 MG/3 ML PE: 18 | 30 days supply | Qty: 6 | Fill #1

## 2018-01-09 MED FILL — ELIQUIS 5 MG TABLET: 5 | 30 days supply | Qty: 60 | Fill #5

## 2018-01-09 MED FILL — UNIFINE PENTIPS 8MM 31G: 31G X 8 MM | 33 days supply | Qty: 200 | Fill #9

## 2018-01-09 NOTE — Therapy (Signed)
[facility] [address], [place], [place], [address] Phone: [phone]   Fax:  [phone]  Physical Therapy Treatment  Patient Details  Name: Trevor Fischer MRN: [id number] Date of Birth: March 27, 1961 Referring Provider: Dr. Elta Guadeloupe L. Dumonski   Encounter Date: 01/09/2018  PT End of Session - 01/09/18 0838    Visit Number  12    Date for PT Re-Evaluation  01/18/18    Authorization Type  W/C- 17 total approved    Authorization - Visit Number  12    Authorization - Number of Visits  17    PT Start Time  9983    PT Stop Time  0854    PT Time Calculation (min)  58 min    Activity Tolerance  Patient tolerated treatment well    Behavior During Therapy  WFL for tasks assessed/performed       Past Medical History:  Diagnosis Date  . Allergic rhinitis   . Degenerative joint disease of knee   . DVT (deep venous thrombosis) (HCC) RLE X 2   Ended anticoagulation in 2012  . GERD (gastroesophageal reflux disease)   . 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 HIV infection ([place])   . Hx MRSA infection   . Hyperlipidemia   . Hypertension   . Pulmonary embolism ([place]) 05/14/2017  . Superficial thrombophlebitis   . Type II diabetes mellitus ([place]) 9/09    Past Surgical History:  Procedure Laterality Date  . ENDOVENOUS ABLATION SAPHENOUS VEIN W/ LASER  07-17-2011 LEFT GRERATER SAPHENOUS VEIN AND STAB PHLEBECTOMIES   10-20   LEFT LEG  . VEIN LIGATION AND STRIPPING Bilateral     There were no vitals filed for this visit.  Subjective Assessment - 01/09/18 0755    Subjective  I had injection last week.  I feel a little better.  Rt Leg is still numb but not as bad.  80% overall improvement since the start of care.    Currently in Pain?  Yes    Pain Score  3     Pain Location  Back    Pain Orientation  Right    Pain Descriptors / Indicators  Aching;Dull    Pain Type  Acute pain    Pain Onset  More than a month ago     Pain Frequency  Constant    Aggravating Factors   work, walking, activity    Pain Relieving Factors  pain medication, Gabapentin                       [facility] Adult PT Treatment/Exercise - 01/09/18 0001      Lumbar Exercises: Stretches   Active Hamstring Stretch  Left;Right;3 reps;20 seconds      Lumbar Exercises: Aerobic   Nustep  Level 2 x10 minutes PT present to discuss progress       Lumbar Exercises: Machines for Strengthening   Leg Press  100# bil legs 2x10, 60# Rt and Lt single 2x10 seat 9      Lumbar Exercises: Standing   Other Standing Lumbar Exercises  walking in reverse with abdominal bracing: 25# 2x10      Lumbar Exercises: Supine   Straight Leg Raise  10 reps      Traction   Type of Traction  Lumbar    Min (lbs)  60    Max (lbs)  120    Hold Time  60    Rest Time  10    Time  15               PT Short Term Goals - 12/19/17 0830      PT SHORT TERM GOAL #2   Title  able to centralize the pain into his lumbar with lateral shifting    Status  Achieved      PT SHORT TERM GOAL #3   Title  ability to demonstrate correct body mechanics with daily tasks to decrease strain on back    Status  Achieved        PT Long Term Goals - 01/09/18 0802      PT LONG TERM GOAL #1   Title  independent with HEP and understand how to progress himself    Time  4    Period  Weeks    Status  On-going      PT LONG TERM GOAL #2   Title  able to abolish his pain with McKenzie technique     Baseline  80% improvement    Time  4    Period  Weeks    Status  On-going            Plan - 01/09/18 0802    Clinical Impression Statement  Pt reports 80% overall improvement in symptoms since the start of care.  Pt with continued Rt LE numbness.  Pt had injection last week and reports some improvement in this pain.  Pt remains weak in his core and hips and requires minor verbal cueing and supervision with exercise for advancement as appropriate.  Pt will  continue to benefit from skilled PT for hip strength, nerve glides, traction and core strength.      Rehab Potential  Excellent    PT Frequency  2x / week    PT Duration  4 weeks    PT Treatment/Interventions  Iontophoresis 4mg /ml Dexamethasone;Cryotherapy;Electrical Stimulation;Moist Heat;Traction;Ultrasound;Therapeutic exercise;Therapeutic activities;Neuromuscular re-education;Patient/family education;Manual techniques    PT Next Visit Plan  Traction, core and hip strength, nerve glides    Consulted and Agree with Plan of Care  Patient       Patient will benefit from skilled therapeutic intervention in order to improve the following deficits and impairments:  Increased fascial restricitons, Pain, Decreased mobility, Increased muscle spasms, Decreased strength, Decreased range of motion, Decreased endurance, Decreased activity tolerance, Impaired flexibility  Visit Diagnosis: Acute right-sided low back pain with right-sided sciatica  Muscle weakness (generalized)  Muscle spasm of back     Problem List Patient Active Problem List   Diagnosis Date Noted  . Lumbar radiculopathy, right 12/10/2017  . Acute paronychia of toe of right foot 08/03/2017  . Left shoulder pain 06/25/2017  . Dyspnea on exertion 05/14/2017  . Personal history of PE (pulmonary embolism) 05/14/2017  . Erectile dysfunction 10/11/2016  . Daytime somnolence 02/20/2016  . Advanced care planning/counseling discussion 02/20/2016  . Cough 09/14/2015  . Back pain 07/26/2015  . Flatulence 05/13/2015  . 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 Trigger thumb of right hand 05/08/2012  . Varicose veins of lower extremities with other complications 82/99/3716  . DEGENERATIVE JOINT DISEASE, KNEE 07/09/2008  . Diabetes mellitus with neuropathy ([place]) 07/03/2008  . Hyperlipemia 07/03/2008  . Human immunodeficiency virus (HIV) disease ([place]) 03/01/2007  . Hypertension 03/01/2007  . GERD 03/01/2007    Sigurd Sos, PT 01/09/18 8:41 AM  [facility] [address], [place], [place], [address] Phone: [phone]  Fax:  [phone]  Name: GURVEER COLUCCI MRN: [id number] Date of Birth: 1960/11/15

## 2018-01-10 MED FILL — BIKTARVY 50-200-25 MG TABS: 50-200-25 | 30 days supply | Qty: 30 | Fill #3

## 2018-01-10 MED FILL — HUMALOG 100 UNITS/ML KWIKPE: 100 | 38 days supply | Qty: 15 | Fill #4

## 2018-01-11 ENCOUNTER — Ambulatory Visit: Payer: PRIVATE HEALTH INSURANCE | Admitting: Physical Therapy

## 2018-01-11 ENCOUNTER — Encounter: Payer: Self-pay | Admitting: Physical Therapy

## 2018-01-11 DIAGNOSIS — M5441 Lumbago with sciatica, right side: Secondary | ICD-10-CM

## 2018-01-11 DIAGNOSIS — M62830 Muscle spasm of back: Secondary | ICD-10-CM

## 2018-01-11 DIAGNOSIS — M6281 Muscle weakness (generalized): Secondary | ICD-10-CM

## 2018-01-11 MED FILL — HYDROCODON-APAP 5-325: 5-325 | 13 days supply | Qty: 40 | Fill #0

## 2018-01-11 NOTE — Therapy (Signed)
[facility] [address] [place], [place], [address] Phone: [phone]   Fax:  [phone]  Physical Therapy Treatment  Patient Details  Name: Trevor Fischer MRN: [id number] Date of Birth: 04-17-1961 Referring Provider: Dr. Elta Guadeloupe L. Dumonski   Encounter Date: 01/11/2018  PT End of Session - 01/11/18 0756    Visit Number  13    Date for PT Re-Evaluation  01/18/18    Authorization Type  W/C- 17 total approved    Authorization - Visit Number  4    Authorization - Number of Visits  17    PT Start Time  4010    PT Stop Time  0855    PT Time Calculation (min)  59 min    Activity Tolerance  Patient tolerated treatment well    Behavior During Therapy  WFL for tasks assessed/performed       Past Medical History:  Diagnosis Date  . Allergic rhinitis   . Degenerative joint disease of knee   . DVT (deep venous thrombosis) (HCC) RLE X 2   Ended anticoagulation in 2012  . GERD (gastroesophageal reflux disease)   . 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 HIV infection ([place])   . Hx MRSA infection   . Hyperlipidemia   . Hypertension   . Pulmonary embolism ([place]) 05/14/2017  . Superficial thrombophlebitis   . Type II diabetes mellitus ([place]) 9/09    Past Surgical History:  Procedure Laterality Date  . ENDOVENOUS ABLATION SAPHENOUS VEIN W/ LASER  07-17-2011 LEFT GRERATER SAPHENOUS VEIN AND STAB PHLEBECTOMIES   10-20   LEFT LEG  . VEIN LIGATION AND STRIPPING Bilateral     There were no vitals filed for this visit.  Subjective Assessment - 01/11/18 0802    Subjective  I am feeling really good this AM.     Diagnostic tests  MRI showed 2 bulging disc on a nerve    Patient Stated Goals  move better and reduce pain    Currently in Pain?  No/denies Just numbness                       [facility] Adult PT Treatment/Exercise - 01/11/18 0001      Lumbar Exercises: Stretches   Active Hamstring Stretch   Left;Right;3 reps;20 seconds      Lumbar Exercises: Aerobic   Nustep  L3 x 10 min PTA present      Lumbar Exercises: Machines for Strengthening   Leg Press  100# bil legs 2x10, 60# Rt and Lt single 2x10 seat 9, VC for lengthening his trunk      Lumbar Exercises: Standing   Other Standing Lumbar Exercises  walking in reverse with abdominal bracing: 25# 2x10      Lumbar Exercises: Supine   Bridge  -- 8x with Pilates principles      Traction   Type of Traction  Lumbar    Min (lbs)  60    Max (lbs)  120    Hold Time  60    Rest Time  10    Time  15      Manual Therapy   Manual Therapy  -- RTLE long axis leg pull 3x 1 min               PT Short Term Goals - 12/19/17 0830      PT SHORT TERM GOAL #2   Title  able to centralize the pain  into his lumbar with lateral shifting    Status  Achieved      PT SHORT TERM GOAL #3   Title  ability to demonstrate correct body mechanics with daily tasks to decrease strain on back    Status  Achieved        PT Long Term Goals - 01/09/18 0802      PT LONG TERM GOAL #1   Title  independent with HEP and understand how to progress himself    Time  4    Period  Weeks    Status  On-going      PT LONG TERM GOAL #2   Title  able to abolish his pain with McKenzie technique     Baseline  80% improvement    Time  4    Period  Weeks    Status  On-going            Plan - 01/11/18 0756    Clinical Impression Statement  Pt continues to do very well per his report. His numbness he rates was low this AM ( 2 level). He performed his strengthening exercises well excepts he needs VC for posture and core activation. PTA encouraged pt to do his HEP more consistently ( bridge, etc) to help progress his strength.     Rehab Potential  Excellent    PT Frequency  2x / week    PT Duration  4 weeks    PT Treatment/Interventions  Iontophoresis 4mg /ml Dexamethasone;Cryotherapy;Electrical Stimulation;Moist Heat;Traction;Ultrasound;Therapeutic  exercise;Therapeutic activities;Neuromuscular re-education;Patient/family education;Manual techniques    PT Next Visit Plan  Traction, core and hip strength, nerve glides    PT Home Exercise Plan  progress McKensie exercise    Consulted and Agree with Plan of Care  Patient       Patient will benefit from skilled therapeutic intervention in order to improve the following deficits and impairments:  Increased fascial restricitons, Pain, Decreased mobility, Increased muscle spasms, Decreased strength, Decreased range of motion, Decreased endurance, Decreased activity tolerance, Impaired flexibility  Visit Diagnosis: Acute right-sided low back pain with right-sided sciatica  Muscle weakness (generalized)  Muscle spasm of back     Problem List Patient Active Problem List   Diagnosis Date Noted  . Lumbar radiculopathy, right 12/10/2017  . Acute paronychia of toe of right foot 08/03/2017  . Left shoulder pain 06/25/2017  . Dyspnea on exertion 05/14/2017  . Personal history of PE (pulmonary embolism) 05/14/2017  . Erectile dysfunction 10/11/2016  . Daytime somnolence 02/20/2016  . Advanced care planning/counseling discussion 02/20/2016  . Cough 09/14/2015  . Back pain 07/26/2015  . Flatulence 05/13/2015  . 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 Trigger thumb of right hand 05/08/2012  . Varicose veins of lower extremities with other complications 69/45/0388  . DEGENERATIVE JOINT DISEASE, KNEE 07/09/2008  . Diabetes mellitus with neuropathy ([place]) 07/03/2008  . Hyperlipemia 07/03/2008  . Human immunodeficiency virus (HIV) disease ([place]) 03/01/2007  . Hypertension 03/01/2007  . GERD 03/01/2007    Anihya Tuma, PTA 01/11/2018, 8:51 AM  [facility] [address], [place], [place], [address] Phone: [phone]   Fax:  [phone]  Name: Trevor Fischer MRN: [id number] Date of Birth:  [birth year]

## 2018-01-15 ENCOUNTER — Ambulatory Visit: Payer: PRIVATE HEALTH INSURANCE

## 2018-01-15 DIAGNOSIS — M5441 Lumbago with sciatica, right side: Secondary | ICD-10-CM

## 2018-01-15 DIAGNOSIS — M6281 Muscle weakness (generalized): Secondary | ICD-10-CM

## 2018-01-15 DIAGNOSIS — M62830 Muscle spasm of back: Secondary | ICD-10-CM

## 2018-01-15 NOTE — Therapy (Signed)
[facility] [address] [place], [place], [address] Phone: [phone]   Fax:  [phone]  Physical Therapy Treatment  Patient Details  Name: Trevor Fischer MRN: [id number] Date of Birth: [birth year] Referring Provider: Dr. Elta Guadeloupe L. Dumonski   Encounter Date: 01/15/2018  PT End of Session - 01/15/18 0924    Visit Number  14    Date for PT Re-Evaluation  01/18/18    Authorization Type  W/C- 17 total approved    Authorization - Visit Number  14    Authorization - Number of Visits  17    PT Start Time  9983    PT Stop Time  0941    PT Time Calculation (min)  58 min    Activity Tolerance  Patient tolerated treatment well    Behavior During Therapy  WFL for tasks assessed/performed       Past Medical History:  Diagnosis Date  . Allergic rhinitis   . Degenerative joint disease of knee   . DVT (deep venous thrombosis) (HCC) RLE X 2   Ended anticoagulation in 2012  . GERD (gastroesophageal reflux disease)   . 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 HIV infection ([place])   . Hx MRSA infection   . Hyperlipidemia   . Hypertension   . Pulmonary embolism ([place]) 05/14/2017  . Superficial thrombophlebitis   . Type II diabetes mellitus ([place]) 9/09    Past Surgical History:  Procedure Laterality Date  . ENDOVENOUS ABLATION SAPHENOUS VEIN W/ LASER  07-17-2011 LEFT GRERATER SAPHENOUS VEIN AND STAB PHLEBECTOMIES   10-20   LEFT LEG  . VEIN LIGATION AND STRIPPING Bilateral     There were no vitals filed for this visit.  Subjective Assessment - 01/15/18 0857    Subjective  I am 90% better.  I still have numbness in my Rt foot but it is better.  I want to get off the pain medication.    Currently in Pain?  Yes    Pain Score  1     Pain Location  Back    Pain Orientation  Right    Pain Descriptors / Indicators  Aching;Dull    Pain Type  Acute pain    Pain Onset  More than a month ago    Pain Frequency  Constant     Aggravating Factors   work, walking, activity    Pain Relieving Factors  pain medication, Gabapentin         [facility] PT Assessment - 01/15/18 0001      Assessment   Medical Diagnosis  Lumbar radiculopathy      Prior Function   Level of Independence  Independent      Cognition   Overall Cognitive Status  Within Functional Limits for tasks assessed      Observation/Other Assessments   Focus on Therapeutic Outcomes (FOTO)   39% limitation                   [facility] Adult PT Treatment/Exercise - 01/15/18 0001      Lumbar Exercises: Stretches   Active Hamstring Stretch  Left;Right;3 reps;20 seconds    Lower Trunk Rotation  3 reps;20 seconds legs on ball      Lumbar Exercises: Aerobic   Nustep  L3 x 10 min PTA present      Lumbar Exercises: Machines for Strengthening   Leg Press  100# bil legs 2x10, 60# Rt and Lt single 2x10 seat 9,  VC for lengthening his trunk      Lumbar Exercises: Standing   Other Standing Lumbar Exercises  walking in reverse with abdominal bracing: 25# 3x10      Lumbar Exercises: Supine   Bridge  20 reps      Traction   Type of Traction  Lumbar    Min (lbs)  55    Max (lbs)  125    Hold Time  60    Rest Time  10    Time  15               PT Short Term Goals - 12/19/17 0830      PT SHORT TERM GOAL #2   Title  able to centralize the pain into his lumbar with lateral shifting    Status  Achieved      PT SHORT TERM GOAL #3   Title  ability to demonstrate correct body mechanics with daily tasks to decrease strain on back    Status  Achieved        PT Long Term Goals - 01/15/18 0900      PT LONG TERM GOAL #5   Title  FOTO score </= 41% limitation    Baseline  39%     Status  Achieved            Plan - 01/15/18 0908    Clinical Impression Statement  Pt reports 90% overall improvement in symptoms since the start of care.  FOTO is improved to 39% limitation.  Pt demonstrated Rt LE weakness with leg press today.   Verbal cues required for bridging and for core activation with other exercise.  Pt continues to responds to traction with reduced Rt LE pain.  Renewal next session.      PT Frequency  2x / week    PT Duration  4 weeks    PT Treatment/Interventions  Iontophoresis 4mg /ml Dexamethasone;Cryotherapy;Electrical Stimulation;Moist Heat;Traction;Ultrasound;Therapeutic exercise;Therapeutic activities;Neuromuscular re-education;Patient/family education;Manual techniques    PT Next Visit Plan  Traction, core and hip strength, renewal    Consulted and Agree with Plan of Care  Patient       Patient will benefit from skilled therapeutic intervention in order to improve the following deficits and impairments:  Increased fascial restricitons, Pain, Decreased mobility, Increased muscle spasms, Decreased strength, Decreased range of motion, Decreased endurance, Decreased activity tolerance, Impaired flexibility  Visit Diagnosis: Acute right-sided low back pain with right-sided sciatica  Muscle weakness (generalized)  Muscle spasm of back     Problem List Patient Active Problem List   Diagnosis Date Noted  . Lumbar radiculopathy, right 12/10/2017  . Acute paronychia of toe of right foot 08/03/2017  . Left shoulder pain 06/25/2017  . Dyspnea on exertion 05/14/2017  . Personal history of PE (pulmonary embolism) 05/14/2017  . Erectile dysfunction 10/11/2016  . Daytime somnolence 02/20/2016  . Advanced care planning/counseling discussion 02/20/2016  . Cough 09/14/2015  . Back pain 07/26/2015  . Flatulence 05/13/2015  . 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 Trigger thumb of right hand 05/08/2012  . Varicose veins of lower extremities with other complications 19/62/2297  . DEGENERATIVE JOINT DISEASE, KNEE 07/09/2008  . Diabetes mellitus with neuropathy ([place]) 07/03/2008  . Hyperlipemia 07/03/2008  . Human immunodeficiency virus (HIV) disease ([place]) 03/01/2007  . Hypertension 03/01/2007   . GERD 03/01/2007    Sigurd Sos, PT 01/15/18 9:29 AM  [facility] [address], [place], [place], [address]  Phone: [phone]   Fax:  [phone]  Name: Trevor Fischer MRN: [id number] Date of Birth: [birth year]

## 2018-01-17 ENCOUNTER — Other Ambulatory Visit: Payer: Self-pay | Admitting: Internal Medicine

## 2018-01-17 ENCOUNTER — Ambulatory Visit: Payer: PRIVATE HEALTH INSURANCE

## 2018-01-17 DIAGNOSIS — M5441 Lumbago with sciatica, right side: Secondary | ICD-10-CM

## 2018-01-17 DIAGNOSIS — M6281 Muscle weakness (generalized): Secondary | ICD-10-CM

## 2018-01-17 DIAGNOSIS — M62830 Muscle spasm of back: Secondary | ICD-10-CM

## 2018-01-17 MED FILL — ACCU-CHEK GUIDE STRP: 25 days supply | Qty: 100 | Fill #2

## 2018-01-17 MED FILL — ACCU-CHEK FASTCLIX LANCETS: 25 days supply | Qty: 102 | Fill #0

## 2018-01-17 NOTE — Therapy (Signed)
[facility] [address], [place], [place], [address] Phone: [phone]   Fax:  [phone]  Physical Therapy Treatment  Patient Details  Name: Trevor Fischer MRN: [id number] Date of Birth: 09-27-1961 Referring Provider: Dr. Elta Guadeloupe L. Dumonski   Encounter Date: 01/17/2018  PT End of Session - 01/17/18 0928    Visit Number  15    Date for PT Re-Evaluation  02/15/18    Authorization Type  W/C- 17 total approved    Authorization - Visit Number  15    Authorization - Number of Visits  17    PT Start Time  0846    PT Stop Time  0941    PT Time Calculation (min)  55 min    Activity Tolerance  Patient tolerated treatment well    Behavior During Therapy  [facility] for tasks assessed/performed       Past Medical History:  Diagnosis Date  . Allergic rhinitis   . Degenerative joint disease of knee   . DVT (deep venous thrombosis) (HCC) RLE X 2   Ended anticoagulation in 2012  . GERD (gastroesophageal reflux disease)   . 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 HIV infection ([place])   . Hx MRSA infection   . Hyperlipidemia   . Hypertension   . Pulmonary embolism ([place]) 05/14/2017  . Superficial thrombophlebitis   . Type II diabetes mellitus ([place]) 9/09    Past Surgical History:  Procedure Laterality Date  . ENDOVENOUS ABLATION SAPHENOUS VEIN W/ LASER  07-17-2011 LEFT GRERATER SAPHENOUS VEIN AND STAB PHLEBECTOMIES   10-20   LEFT LEG  . VEIN LIGATION AND STRIPPING Bilateral     There were no vitals filed for this visit.  Subjective Assessment - 01/17/18 0855    Subjective  I am 90% better.  Numbness in my Rt foot is getting better.      Patient Stated Goals  move better and reduce pain    Pain Score  0-No pain Just Rt LE tingling         [facility] PT Assessment - 01/17/18 0001      Assessment   Medical Diagnosis  Lumbar radiculopathy      Prior Function   Level of Independence  Independent    Vocation  Full  time employment      Cognition   Overall Cognitive Status  Within Functional Limits for tasks assessed      Observation/Other Assessments   Focus on Therapeutic Outcomes (FOTO)   39% limitation      ROM / Strength   AROM / PROM / Strength  AROM;PROM;Strength      Strength   Overall Strength  Deficits    Strength Assessment Site  Hip    Right/Left Hip  Right    Right Hip Flexion  4+/5    Right Hip Extension  4/5    Right Hip ADduction  4/5                   [facility] Adult PT Treatment/Exercise - 01/17/18 0001      Lumbar Exercises: Stretches   Active Hamstring Stretch  Left;Right;3 reps;20 seconds    Lower Trunk Rotation  3 reps;20 seconds legs on ball      Lumbar Exercises: Aerobic   Nustep  L3 x 10 min      Lumbar Exercises: Machines for Strengthening   Leg Press  100# bil legs 2x10, 60# Rt and Lt single 2x10  seat 9, VC for lengthening his trunk      Lumbar Exercises: Standing   Other Standing Lumbar Exercises  walking in reverse with abdominal bracing: 25# 3x10      Lumbar Exercises: Supine   Bridge  20 reps ball under legs      Traction   Type of Traction  Lumbar    Min (lbs)  55    Max (lbs)  125    Hold Time  60    Rest Time  10    Time  15               PT Short Term Goals - 12/19/17 0830      PT SHORT TERM GOAL #2   Title  able to centralize the pain into his lumbar with lateral shifting    Status  Achieved      PT SHORT TERM GOAL #3   Title  ability to demonstrate correct body mechanics with daily tasks to decrease strain on back    Status  Achieved        PT Long Term Goals - 01/17/18 0904      PT LONG TERM GOAL #1   Title  independent with HEP and understand how to progress himself    Baseline  independent in current HEP    Time  4    Period  Weeks    Status  On-going    Target Date  02/15/18      PT LONG TERM GOAL #2   Title  report a 95% reduction in Rt LE radiculopathy     Baseline  constant Rt LE pain, 75%  improvement in intensity    Time  4    Period  Weeks    Status  On-going    Target Date  02/15/18      PT LONG TERM GOAL #3   Title  able to tolerate work tasks with pain decreased >/= 75% due to abdominal bracing and centralization of pain    Baseline  90%    Status  Achieved      PT LONG TERM GOAL #4   Title  able to sit without difficulty due to pain minimal to none on bilateral buttocks    Status  Achieved      PT LONG TERM GOAL #5   Title  FOTO score </= 41% limitation    Baseline  39% limitation     Status  Achieved      Additional Long Term Goals   Additional Long Term Goals  Yes      PT LONG TERM GOAL #6   Title  demonstrate 4+/5 to 5/5 Rt hip strength to improve safety and endurance at work and in the community    Time  4    Period  Weeks    Status  New    Target Date  02/15/18            Plan - 01/17/18 0859    Clinical Impression Statement  Pt denies any lumbar pain at this time and only reports Rt LE tingling.  Pt demonstrated Rt LE weakness with Leg press and Bosu step ups today.  Rt hip strength is improved yet still limited.  Pt requires verbal cues for scapular position and core activation.  Pt will continue to benefit from skilled PT for core strength, LE strength and stability and traction and nerve glides to address Rt LE radiculopathy.     Rehab Potential  Excellent    PT Frequency  2x / week    PT Duration  4 weeks    PT Treatment/Interventions  Iontophoresis 4mg /ml Dexamethasone;Cryotherapy;Electrical Stimulation;Moist Heat;Traction;Ultrasound;Therapeutic exercise;Therapeutic activities;Neuromuscular re-education;Patient/family education;Manual techniques    PT Next Visit Plan  Traction, core and hip strength     Recommended Other Services  request has been made for cert and recert x 2, new recert sent 8/93/73    Consulted and Agree with Plan of Care  Patient       Patient will benefit from skilled therapeutic intervention in order to improve the  following deficits and impairments:  Increased fascial restricitons, Pain, Decreased mobility, Increased muscle spasms, Decreased strength, Decreased range of motion, Decreased endurance, Decreased activity tolerance, Impaired flexibility  Visit Diagnosis: Acute right-sided low back pain with right-sided sciatica - Plan: PT plan of care cert/re-cert  Muscle weakness (generalized) - Plan: PT plan of care cert/re-cert  Muscle spasm of back - Plan: PT plan of care cert/re-cert     Problem List Patient Active Problem List   Diagnosis Date Noted  . Lumbar radiculopathy, right 12/10/2017  . Acute paronychia of toe of right foot 08/03/2017  . Left shoulder pain 06/25/2017  . Dyspnea on exertion 05/14/2017  . Personal history of PE (pulmonary embolism) 05/14/2017  . Erectile dysfunction 10/11/2016  . Daytime somnolence 02/20/2016  . Advanced care planning/counseling discussion 02/20/2016  . Cough 09/14/2015  . Back pain 07/26/2015  . Flatulence 05/13/2015  . 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 Trigger thumb of right hand 05/08/2012  . Varicose veins of lower extremities with other complications 42/87/6811  . DEGENERATIVE JOINT DISEASE, KNEE 07/09/2008  . Diabetes mellitus with neuropathy ([place]) 07/03/2008  . Hyperlipemia 07/03/2008  . Human immunodeficiency virus (HIV) disease ([place]) 03/01/2007  . Hypertension 03/01/2007  . GERD 03/01/2007     Sigurd Sos, PT 01/17/18 9:29 AM  [facility] [address], [place], [place], [address] Phone: [phone]   Fax:  [phone]  Name: HANSFORD HIRT MRN: [id number] Date of Birth: January 25, 1961

## 2018-01-21 ENCOUNTER — Ambulatory Visit: Payer: PRIVATE HEALTH INSURANCE

## 2018-01-21 DIAGNOSIS — M6281 Muscle weakness (generalized): Secondary | ICD-10-CM

## 2018-01-21 DIAGNOSIS — M62830 Muscle spasm of back: Secondary | ICD-10-CM

## 2018-01-21 DIAGNOSIS — M5441 Lumbago with sciatica, right side: Secondary | ICD-10-CM | POA: Diagnosis not present

## 2018-01-21 NOTE — Therapy (Signed)
[facility] [address], [place], [place], [address] Phone: [phone]   Fax:  [phone]  Physical Therapy Treatment  Patient Details  Name: Trevor Fischer MRN: [id number] Date of Birth: January 20, 1961 Referring Provider: Dr. Elta Guadeloupe L. Dumonski   Encounter Date: 01/21/2018  PT End of Session - 01/21/18 0925    Visit Number  16    Date for PT Re-Evaluation  02/15/18    Authorization Type  W/C- 25 total approved    Authorization - Visit Number  74    Authorization - Number of Visits  25    PT Start Time  [id number]    PT Stop Time  0940    PT Time Calculation (min)  58 min    Activity Tolerance  Patient tolerated treatment well    Behavior During Therapy  WFL for tasks assessed/performed       Past Medical History:  Diagnosis Date  . Allergic rhinitis   . Degenerative joint disease of knee   . DVT (deep venous thrombosis) (HCC) RLE X 2   Ended anticoagulation in 2012  . GERD (gastroesophageal reflux disease)   . 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 HIV infection ([place])   . Hx MRSA infection   . Hyperlipidemia   . Hypertension   . Pulmonary embolism ([place]) 05/14/2017  . Superficial thrombophlebitis   . Type II diabetes mellitus ([place]) 9/09    Past Surgical History:  Procedure Laterality Date  . ENDOVENOUS ABLATION SAPHENOUS VEIN W/ LASER  07-17-2011 LEFT GRERATER SAPHENOUS VEIN AND STAB PHLEBECTOMIES   10-20   LEFT LEG  . VEIN LIGATION AND STRIPPING Bilateral     There were no vitals filed for this visit.  Subjective Assessment - 01/21/18 0845    Subjective  I worked all weekend.  My Rt foot is swollen.      Currently in Pain?  Yes    Pain Score  0-No pain no back pain, Rt foot is numb    Pain Location  Back    Pain Orientation  Right    Pain Descriptors / Indicators  Numbness    Pain Type  Acute pain    Pain Onset  More than a month ago    Pain Frequency  Constant    Aggravating Factors    work, walking, activity    Pain Relieving Factors  pain medication, Gabapentin                       [facility] Adult PT Treatment/Exercise - 01/21/18 0001      Lumbar Exercises: Stretches   Active Hamstring Stretch  Left;Right;3 reps;20 seconds    Lower Trunk Rotation  3 reps;20 seconds legs on ball      Lumbar Exercises: Aerobic   Nustep  L3 x 10 min      Lumbar Exercises: Machines for Strengthening   Leg Press  100# bil legs 2x10, 60# Rt and Lt single 2x10 seat 9, VC for lengthening his trunk      Lumbar Exercises: Standing   Other Standing Lumbar Exercises  walking in reverse with abdominal bracing: 25# 3x10      Lumbar Exercises: Seated   Other Seated Lumbar Exercises  nerve glides x 10 bil      Lumbar Exercises: Supine   Bridge  20 reps ball under legs      Traction   Type of Traction  Lumbar    Min (  lbs)  55    Max (lbs)  125    Hold Time  60    Rest Time  10    Time  15               PT Short Term Goals - 12/19/17 0830      PT SHORT TERM GOAL #2   Title  able to centralize the pain into his lumbar with lateral shifting    Status  Achieved      PT SHORT TERM GOAL #3   Title  ability to demonstrate correct body mechanics with daily tasks to decrease strain on back    Status  Achieved        PT Long Term Goals - 01/17/18 0904      PT LONG TERM GOAL #1   Title  independent with HEP and understand how to progress himself    Baseline  independent in current HEP    Time  4    Period  Weeks    Status  On-going    Target Date  02/15/18      PT LONG TERM GOAL #2   Title  report a 95% reduction in Rt LE radiculopathy     Baseline  constant Rt LE pain, 75% improvement in intensity    Time  4    Period  Weeks    Status  On-going    Target Date  02/15/18      PT LONG TERM GOAL #3   Title  able to tolerate work tasks with pain decreased >/= 75% due to abdominal bracing and centralization of pain    Baseline  90%    Status  Achieved       PT LONG TERM GOAL #4   Title  able to sit without difficulty due to pain minimal to none on bilateral buttocks    Status  Achieved      PT LONG TERM GOAL #5   Title  FOTO score </= 41% limitation    Baseline  39% limitation     Status  Achieved      Additional Long Term Goals   Additional Long Term Goals  Yes      PT LONG TERM GOAL #6   Title  demonstrate 4+/5 to 5/5 Rt hip strength to improve safety and endurance at work and in the community    Time  4    Period  Weeks    Status  New    Target Date  02/15/18            Plan - 01/21/18 0859    Clinical Impression Statement  Pt without low back pain.  Pt with continued Rt LE radiculopathy and describes the sensation as numbness.  Pt demonstrates Rt LE weakness with Leg press and Bosu step ups.  Rt hip strength is improved overall yet still limited.  Pt will continue to benefit from skilled PT for stability, LE strength and traction/nerve glides to address Rt LE radiculopathy.      Rehab Potential  Excellent    PT Frequency  2x / week    PT Duration  4 weeks    PT Treatment/Interventions  Iontophoresis 4mg /ml Dexamethasone;Cryotherapy;Electrical Stimulation;Moist Heat;Traction;Ultrasound;Therapeutic exercise;Therapeutic activities;Neuromuscular re-education;Patient/family education;Manual techniques    PT Next Visit Plan  Traction, core and hip strength     Consulted and Agree with Plan of Care  Patient       Patient will benefit from skilled therapeutic intervention in  order to improve the following deficits and impairments:  Increased fascial restricitons, Pain, Decreased mobility, Increased muscle spasms, Decreased strength, Decreased range of motion, Decreased endurance, Decreased activity tolerance, Impaired flexibility  Visit Diagnosis: Acute right-sided low back pain with right-sided sciatica  Muscle weakness (generalized)  Muscle spasm of back     Problem List Patient Active Problem List   Diagnosis Date  Noted  . Lumbar radiculopathy, right 12/10/2017  . Acute paronychia of toe of right foot 08/03/2017  . Left shoulder pain 06/25/2017  . Dyspnea on exertion 05/14/2017  . Personal history of PE (pulmonary embolism) 05/14/2017  . Erectile dysfunction 10/11/2016  . Daytime somnolence 02/20/2016  . Advanced care planning/counseling discussion 02/20/2016  . Cough 09/14/2015  . Back pain 07/26/2015  . Flatulence 05/13/2015  . 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 Trigger thumb of right hand 05/08/2012  . Varicose veins of lower extremities with other complications 77/08/6578  . DEGENERATIVE JOINT DISEASE, KNEE 07/09/2008  . Diabetes mellitus with neuropathy ([place]) 07/03/2008  . Hyperlipemia 07/03/2008  . Human immunodeficiency virus (HIV) disease ([place]) 03/01/2007  . Hypertension 03/01/2007  . GERD 03/01/2007    Sigurd Sos, PT 01/21/18 9:29 AM  [facility] [address] [place], [place], [address] Phone: [phone]   Fax:  [phone]  Name: Trevor Fischer MRN: [id number] Date of Birth: 12/23/1960

## 2018-01-22 MED FILL — GABAPENTIN 600 MG TAB: 600 | 90 days supply | Qty: 270 | Fill #1

## 2018-01-24 ENCOUNTER — Ambulatory Visit: Payer: PRIVATE HEALTH INSURANCE

## 2018-01-24 DIAGNOSIS — M5441 Lumbago with sciatica, right side: Secondary | ICD-10-CM | POA: Diagnosis not present

## 2018-01-24 DIAGNOSIS — M6281 Muscle weakness (generalized): Secondary | ICD-10-CM

## 2018-01-24 DIAGNOSIS — M62830 Muscle spasm of back: Secondary | ICD-10-CM

## 2018-01-24 NOTE — Therapy (Signed)
[facility] [address] [place], [place], [address] Phone: [phone]   Fax:  [phone]  Physical Therapy Treatment  Patient Details  Name: Trevor Fischer MRN: [id number] Date of Birth: Jul 23, 1961 Referring Provider: Dr. Elta Guadeloupe L. [place]ki   Encounter Date: 01/24/2018  PT End of Session - 01/24/18 0920    Visit Number  17    Date for PT Re-Evaluation  02/15/18    Authorization Type  W/C- 25 total approved    Authorization - Visit Number  93    Authorization - Number of Visits  25    PT Start Time  0840    PT Stop Time  0935    PT Time Calculation (min)  55 min    Activity Tolerance  Patient tolerated treatment well    Behavior During Therapy  WFL for tasks assessed/performed       Past Medical History:  Diagnosis Date  . Allergic rhinitis   . Degenerative joint disease of knee   . DVT (deep venous thrombosis) (HCC) RLE X 2   Ended anticoagulation in 2012  . GERD (gastroesophageal reflux disease)   . 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 HIV infection ([place])   . Hx MRSA infection   . Hyperlipidemia   . Hypertension   . Pulmonary embolism ([place]) 05/14/2017  . Superficial thrombophlebitis   . Type II diabetes mellitus ([place]) 9/09    Past Surgical History:  Procedure Laterality Date  . ENDOVENOUS ABLATION SAPHENOUS VEIN W/ LASER  07-17-2011 LEFT GRERATER SAPHENOUS VEIN AND STAB PHLEBECTOMIES   10-20   LEFT LEG  . VEIN LIGATION AND STRIPPING Bilateral     There were no vitals filed for this visit.  Subjective Assessment - 01/24/18 0852    Subjective  I was able to work in the yard for 2 days without increased back pain.  Rt foot remains numb.  Sometimes i get sharp pain in my Rt foot.      Currently in Pain?  No/denies    Pain Score  -- Rt foot numbness                       [facility] Adult PT Treatment/Exercise - 01/24/18 0001      Lumbar Exercises: Stretches   Active  Hamstring Stretch  Left;Right;3 reps;20 seconds    Lower Trunk Rotation  3 reps;20 seconds legs on ball      Lumbar Exercises: Aerobic   Nustep  L3 x 10 min      Lumbar Exercises: Machines for Strengthening   Leg Press  120# bil legs 2x10, 70# Rt and Lt single 2x10 seat 9, VC for lengthening his trunk      Lumbar Exercises: Standing   Other Standing Lumbar Exercises  walking in reverse with abdominal bracing: 25# 3x10      Lumbar Exercises: Seated   Other Seated Lumbar Exercises  nerve glides x 10 bil      Lumbar Exercises: Supine   Bridge  20 reps ball under legs      Knee/Hip Exercises: Standing   Walking with Sports Cord  25# sidestepping x 10 each SBA for safety      Traction   Type of Traction  Lumbar    Min (lbs)  55    Max (lbs)  125    Hold Time  60    Rest Time  10    Time  15  PT Short Term Goals - 12/19/17 0830      PT SHORT TERM GOAL #2   Title  able to centralize the pain into his lumbar with lateral shifting    Status  Achieved      PT SHORT TERM GOAL #3   Title  ability to demonstrate correct body mechanics with daily tasks to decrease strain on back    Status  Achieved        PT Long Term Goals - 01/17/18 0904      PT LONG TERM GOAL #1   Title  independent with HEP and understand how to progress himself    Baseline  independent in current HEP    Time  4    Period  Weeks    Status  On-going    Target Date  02/15/18      PT LONG TERM GOAL #2   Title  report a 95% reduction in Rt LE radiculopathy     Baseline  constant Rt LE pain, 75% improvement in intensity    Time  4    Period  Weeks    Status  On-going    Target Date  02/15/18      PT LONG TERM GOAL #3   Title  able to tolerate work tasks with pain decreased >/= 75% due to abdominal bracing and centralization of pain    Baseline  90%    Status  Achieved      PT LONG TERM GOAL #4   Title  able to sit without difficulty due to pain minimal to none on bilateral  buttocks    Status  Achieved      PT LONG TERM GOAL #5   Title  FOTO score </= 41% limitation    Baseline  39% limitation     Status  Achieved      Additional Long Term Goals   Additional Long Term Goals  Yes      PT LONG TERM GOAL #6   Title  demonstrate 4+/5 to 5/5 Rt hip strength to improve safety and endurance at work and in the community    Time  4    Period  Weeks    Status  New    Target Date  02/15/18            Plan - 01/24/18 0856    Clinical Impression Statement  Pt with continued Rt foot numbness that hasn't changed much in the past week.  Pt with continued Rt LE weakness and demonstrates instability with Bosu Step ups and resisted walking to the side.  Rt hip strength is improved overall yet still limited.  Pt will continue to benefit  from skilled PT for stability, LE sterngth and traction/nerve glides to address Rt LE radiculopathy.      Rehab Potential  Excellent    PT Frequency  2x / week    PT Duration  4 weeks    PT Treatment/Interventions  Iontophoresis 4mg /ml Dexamethasone;Cryotherapy;Electrical Stimulation;Moist Heat;Traction;Ultrasound;Therapeutic exercise;Therapeutic activities;Neuromuscular re-education;Patient/family education;Manual techniques    Consulted and Agree with Plan of Care  Patient       Patient will benefit from skilled therapeutic intervention in order to improve the following deficits and impairments:  Increased fascial restricitons, Pain, Decreased mobility, Increased muscle spasms, Decreased strength, Decreased range of motion, Decreased endurance, Decreased activity tolerance, Impaired flexibility  Visit Diagnosis: Acute right-sided low back pain with right-sided sciatica  Muscle weakness (generalized)  Muscle spasm of back  Problem List Patient Active Problem List   Diagnosis Date Noted  . Lumbar radiculopathy, right 12/10/2017  . Acute paronychia of toe of right foot 08/03/2017  . Left shoulder pain 06/25/2017  .  Dyspnea on exertion 05/14/2017  . Personal history of PE (pulmonary embolism) 05/14/2017  . Erectile dysfunction 10/11/2016  . Daytime somnolence 02/20/2016  . Advanced care planning/counseling discussion 02/20/2016  . Cough 09/14/2015  . Back pain 07/26/2015  . Flatulence 05/13/2015  . 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 Trigger thumb of right hand 05/08/2012  . Varicose veins of lower extremities with other complications 18/84/1660  . DEGENERATIVE JOINT DISEASE, KNEE 07/09/2008  . Diabetes mellitus with neuropathy ([place]) 07/03/2008  . Hyperlipemia 07/03/2008  . Human immunodeficiency virus (HIV) disease ([place]) 03/01/2007  . Hypertension 03/01/2007  . GERD 03/01/2007    Sigurd Sos, PT 01/24/18 9:24 AM  [facility] [address], [place], [place], [address] Phone: [phone]   Fax:  [phone]  Name: Trevor Fischer MRN: [id number] Date of Birth: 02-22-1961

## 2018-01-28 ENCOUNTER — Ambulatory Visit: Payer: PRIVATE HEALTH INSURANCE

## 2018-01-28 DIAGNOSIS — M5441 Lumbago with sciatica, right side: Secondary | ICD-10-CM | POA: Diagnosis not present

## 2018-01-28 DIAGNOSIS — M6281 Muscle weakness (generalized): Secondary | ICD-10-CM

## 2018-01-28 DIAGNOSIS — M62830 Muscle spasm of back: Secondary | ICD-10-CM

## 2018-01-28 NOTE — Therapy (Signed)
[facility] [address] [place], [place], [address] Phone: [phone]   Fax:  [phone]  Physical Therapy Treatment  Patient Details  Name: Trevor Fischer MRN: [id number] Date of Birth: June 17, 1961 Referring Provider: Dr. Elta Guadeloupe L. [place]ki   Encounter Date: 01/28/2018  PT End of Session - 01/28/18 0835    Visit Number  18    Date for PT Re-Evaluation  02/15/18    Authorization Type  W/C- 25 total approved    Authorization - Visit Number  62    Authorization - Number of Visits  25    PT Start Time  6294    PT Stop Time  0853    PT Time Calculation (min)  59 min    Activity Tolerance  Patient tolerated treatment well    Behavior During Therapy  [facility] for tasks assessed/performed       Past Medical History:  Diagnosis Date  . Allergic rhinitis   . Degenerative joint disease of knee   . DVT (deep venous thrombosis) (HCC) RLE X 2   Ended anticoagulation in 2012  . GERD (gastroesophageal reflux disease)   . 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 HIV infection ([place])   . Hx MRSA infection   . Hyperlipidemia   . Hypertension   . Pulmonary embolism ([place]) 05/14/2017  . Superficial thrombophlebitis   . Type II diabetes mellitus ([place]) 9/09    Past Surgical History:  Procedure Laterality Date  . ENDOVENOUS ABLATION SAPHENOUS VEIN W/ LASER  07-17-2011 LEFT GRERATER SAPHENOUS VEIN AND STAB PHLEBECTOMIES   10-20   LEFT LEG  . VEIN LIGATION AND STRIPPING Bilateral     There were no vitals filed for this visit.  Subjective Assessment - 01/28/18 0751    Subjective  I worked all weekend.  It was busy.  I have numbness in my Rt foot and my back is feeling better.  I had back pain with work this week.      Currently in Pain?  Yes    Pain Score  7     Pain Location  Back    Pain Orientation  Right    Pain Descriptors / Indicators  Aching    Pain Type  Acute pain    Pain Onset  More than a month ago    Pain  Frequency  Constant    Aggravating Factors   work, walking, activity    Pain Relieving Factors  Gabapentin, stretching, rest                       [facility] Adult PT Treatment/Exercise - 01/28/18 0001      Lumbar Exercises: Stretches   Active Hamstring Stretch  Left;Right;3 reps;20 seconds    Lower Trunk Rotation  3 reps;20 seconds legs on ball      Lumbar Exercises: Aerobic   Nustep  L3 x 10 min PT present to discuss onset of LBP      Lumbar Exercises: Machines for Strengthening   Leg Press  120# bil legs 2x10, 70# Rt and Lt single 2x10 seat 9, VC for lengthening his trunk      Lumbar Exercises: Standing   Row  Power tower;Both;20 reps 30#    Other Standing Lumbar Exercises  walking in reverse with abdominal bracing: 25# 3x10      Lumbar Exercises: Seated   Other Seated Lumbar Exercises  nerve glides x 10 bil  Lumbar Exercises: Supine   Bridge  20 reps ball under legs      Knee/Hip Exercises: Stretches   Active Hamstring Stretch  Both;3 reps;20 seconds      Knee/Hip Exercises: Standing   Walking with Sports Cord  25# sidestepping x 10 each SBA for safety      Traction   Type of Traction  Lumbar    Min (lbs)  55    Max (lbs)  125    Hold Time  60    Rest Time  10    Time  15               PT Short Term Goals - 12/19/17 0830      PT SHORT TERM GOAL #2   Title  able to centralize the pain into his lumbar with lateral shifting    Status  Achieved      PT SHORT TERM GOAL #3   Title  ability to demonstrate correct body mechanics with daily tasks to decrease strain on back    Status  Achieved        PT Long Term Goals - 01/17/18 0904      PT LONG TERM GOAL #1   Title  independent with HEP and understand how to progress himself    Baseline  independent in current HEP    Time  4    Period  Weeks    Status  On-going    Target Date  02/15/18      PT LONG TERM GOAL #2   Title  report a 95% reduction in Rt LE radiculopathy     Baseline   constant Rt LE pain, 75% improvement in intensity    Time  4    Period  Weeks    Status  On-going    Target Date  02/15/18      PT LONG TERM GOAL #3   Title  able to tolerate work tasks with pain decreased >/= 75% due to abdominal bracing and centralization of pain    Baseline  90%    Status  Achieved      PT LONG TERM GOAL #4   Title  able to sit without difficulty due to pain minimal to none on bilateral buttocks    Status  Achieved      PT LONG TERM GOAL #5   Title  FOTO score </= 41% limitation    Baseline  39% limitation     Status  Achieved      Additional Long Term Goals   Additional Long Term Goals  Yes      PT LONG TERM GOAL #6   Title  demonstrate 4+/5 to 5/5 Rt hip strength to improve safety and endurance at work and in the community    Time  4    Period  Weeks    Status  New    Target Date  02/15/18            Plan - 01/28/18 0813    Clinical Impression Statement  Pt with continued Rt foot numbness and now reports LBP that has flared up over the past couple of days.  Pt denies any consistent pattern to this pain.  Pt reports that he has not been stretching as much as he is used to.  Pt requires tactile cues to reduce scapular elevation with rowing.  Pt with hip and core weakness and will continue to benefit form skilled PT for hip and core  strength and traction to address Rt LE radiculopathy.    Rehab Potential  Excellent    PT Frequency  2x / week    PT Duration  4 weeks    PT Treatment/Interventions  Iontophoresis 4mg /ml Dexamethasone;Cryotherapy;Electrical Stimulation;Moist Heat;Traction;Ultrasound;Therapeutic exercise;Therapeutic activities;Neuromuscular re-education;Patient/family education;Manual techniques    PT Next Visit Plan  Traction, core and hip strength     Consulted and Agree with Plan of Care  Patient       Patient will benefit from skilled therapeutic intervention in order to improve the following deficits and impairments:  Increased  fascial restricitons, Pain, Decreased mobility, Increased muscle spasms, Decreased strength, Decreased range of motion, Decreased endurance, Decreased activity tolerance, Impaired flexibility  Visit Diagnosis: Muscle weakness (generalized)  Acute right-sided low back pain with right-sided sciatica  Muscle spasm of back     Problem List Patient Active Problem List   Diagnosis Date Noted  . Lumbar radiculopathy, right 12/10/2017  . Acute paronychia of toe of right foot 08/03/2017  . Left shoulder pain 06/25/2017  . Dyspnea on exertion 05/14/2017  . Personal history of PE (pulmonary embolism) 05/14/2017  . Erectile dysfunction 10/11/2016  . Daytime somnolence 02/20/2016  . Advanced care planning/counseling discussion 02/20/2016  . Cough 09/14/2015  . Back pain 07/26/2015  . Flatulence 05/13/2015  . 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 Trigger thumb of right hand 05/08/2012  . Varicose veins of lower extremities with other complications 16/07/9603  . DEGENERATIVE JOINT DISEASE, KNEE 07/09/2008  . Diabetes mellitus with neuropathy ([place]) 07/03/2008  . Hyperlipemia 07/03/2008  . Human immunodeficiency virus (HIV) disease ([place]) 03/01/2007  . Hypertension 03/01/2007  . GERD 03/01/2007    Sigurd Sos, PT 01/28/18 8:36 AM  [facility] [address], [place], [place], [address] Phone: [phone]   Fax:  [phone]  Name: Trevor Fischer MRN: [id number] Date of Birth: 05-18-1961

## 2018-01-31 ENCOUNTER — Ambulatory Visit: Payer: PRIVATE HEALTH INSURANCE | Attending: Orthopedic Surgery

## 2018-01-31 DIAGNOSIS — M62830 Muscle spasm of back: Secondary | ICD-10-CM | POA: Diagnosis present

## 2018-01-31 DIAGNOSIS — M6281 Muscle weakness (generalized): Secondary | ICD-10-CM | POA: Insufficient documentation

## 2018-01-31 DIAGNOSIS — M5441 Lumbago with sciatica, right side: Secondary | ICD-10-CM | POA: Insufficient documentation

## 2018-01-31 NOTE — Therapy (Addendum)
[facility] [address], [place], [place], [address] Phone: [phone]   Fax:  [phone]  Physical Therapy Treatment  Patient Details  Name: Trevor Fischer MRN: [id number] Date of Birth: 02-05-1961 Referring Provider: Dr. Elta Guadeloupe L. Dumonski   Encounter Date: 01/31/2018  PT End of Session - 01/31/18 0927    Visit Number  19    Date for PT Re-Evaluation  02/15/18    Authorization Type  W/C- 25 total approved    Authorization - Visit Number  61    Authorization - Number of Visits  25    PT Start Time  0840    PT Stop Time  0939    PT Time Calculation (min)  59 min    Activity Tolerance  Patient tolerated treatment well    Behavior During Therapy  WFL for tasks assessed/performed       Past Medical History:  Diagnosis Date  . Allergic rhinitis   . Degenerative joint disease of knee   . DVT (deep venous thrombosis) (HCC) RLE X 2   Ended anticoagulation in 2012  . GERD (gastroesophageal reflux disease)   . 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 HIV infection ([place])   . Hx MRSA infection   . Hyperlipidemia   . Hypertension   . Pulmonary embolism ([place]) 05/14/2017  . Superficial thrombophlebitis   . Type II diabetes mellitus ([place]) 9/09    Past Surgical History:  Procedure Laterality Date  . ENDOVENOUS ABLATION SAPHENOUS VEIN W/ LASER  07-17-2011 LEFT GRERATER SAPHENOUS VEIN AND STAB PHLEBECTOMIES   10-20   LEFT LEG  . VEIN LIGATION AND STRIPPING Bilateral     There were no vitals filed for this visit.  Subjective Assessment - 01/31/18 0849    Subjective  I did my exercises.  I dont have any pain today.      Currently in Pain?  No/denies                       [facility] Adult PT Treatment/Exercise - 01/31/18 0001      Lumbar Exercises: Stretches   Active Hamstring Stretch  --    Lower Trunk Rotation  -- legs on ball      Lumbar Exercises: Aerobic   Nustep  L3 x 10 min PT present  to discuss onset of LBP      Lumbar Exercises: Machines for Strengthening   Leg Press  130# bil legs 2x10, 75# Rt and Lt single 2x10 seat 9, VC for lengthening his trunk      Lumbar Exercises: Standing   Row  Power tower;Both;20 reps 30#    Other Standing Lumbar Exercises  walking in reverse with abdominal bracing: 30# 3x10      Lumbar Exercises: Supine   Bridge  20 reps ball under legs      Knee/Hip Exercises: Stretches   Active Hamstring Stretch  Both;3 reps;20 seconds      Knee/Hip Exercises: Standing   Forward Step Up  Both;3 sets;10 reps on Bosu    Walking with Sports Cord  25# sidestepping x 10 each SBA for safety      Traction   Type of Traction  Lumbar    Min (lbs)  60    Max (lbs)  135    Hold Time  60    Rest Time  10    Time  15       Traction adjusted traction  down to 120# max after 5 minutes as the additional pull pulled pt down the table.           PT Short Term Goals - 12/19/17 0830      PT SHORT TERM GOAL #2   Title  able to centralize the pain into his lumbar with lateral shifting    Status  Achieved      PT SHORT TERM GOAL #3   Title  ability to demonstrate correct body mechanics with daily tasks to decrease strain on back    Status  Achieved        PT Long Term Goals - 01/17/18 0904      PT LONG TERM GOAL #1   Title  independent with HEP and understand how to progress himself    Baseline  independent in current HEP    Time  4    Period  Weeks    Status  On-going    Target Date  02/15/18      PT LONG TERM GOAL #2   Title  report a 95% reduction in Rt LE radiculopathy     Baseline  constant Rt LE pain, 75% improvement in intensity    Time  4    Period  Weeks    Status  On-going    Target Date  02/15/18      PT LONG TERM GOAL #3   Title  able to tolerate work tasks with pain decreased >/= 75% due to abdominal bracing and centralization of pain    Baseline  90%    Status  Achieved      PT LONG TERM GOAL #4   Title  able to sit  without difficulty due to pain minimal to none on bilateral buttocks    Status  Achieved      PT LONG TERM GOAL #5   Title  FOTO score </= 41% limitation    Baseline  39% limitation     Status  Achieved      Additional Long Term Goals   Additional Long Term Goals  Yes      PT LONG TERM GOAL #6   Title  demonstrate 4+/5 to 5/5 Rt hip strength to improve safety and endurance at work and in the community    Time  4    Period  Weeks    Status  New    Target Date  02/15/18            Plan - 01/31/18 0850    Clinical Impression Statement  Pt with very mild to no numbness in the Rt LE today.  Pt was able to tolerate increased weight with rowing and resisted walking today.  Traction was increased to 135# due to good result with traction.  Pt requires tactile cues with rowing exercise.  Pt will continue to benefit from skilled PT for core and hip strength, nerve glides and traction.      Rehab Potential  Excellent    PT Frequency  2x / week    PT Duration  4 weeks    PT Treatment/Interventions  Iontophoresis 4mg /ml Dexamethasone;Cryotherapy;Electrical Stimulation;Moist Heat;Traction;Ultrasound;Therapeutic exercise;Therapeutic activities;Neuromuscular re-education;Patient/family education;Manual techniques    PT Next Visit Plan  Traction, core and hip strength     Consulted and Agree with Plan of Care  Patient       Patient will benefit from skilled therapeutic intervention in order to improve the following deficits and impairments:  Increased fascial restricitons, Pain, Decreased mobility, Increased  muscle spasms, Decreased strength, Decreased range of motion, Decreased endurance, Decreased activity tolerance, Impaired flexibility  Visit Diagnosis: Muscle weakness (generalized)  Acute right-sided low back pain with right-sided sciatica  Muscle spasm of back     Problem List Patient Active Problem List   Diagnosis Date Noted  . Lumbar radiculopathy, right 12/10/2017  . Acute  paronychia of toe of right foot 08/03/2017  . Left shoulder pain 06/25/2017  . Dyspnea on exertion 05/14/2017  . Personal history of PE (pulmonary embolism) 05/14/2017  . Erectile dysfunction 10/11/2016  . Daytime somnolence 02/20/2016  . Advanced care planning/counseling discussion 02/20/2016  . Cough 09/14/2015  . Back pain 07/26/2015  . Flatulence 05/13/2015  . 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 Trigger thumb of right hand 05/08/2012  . Varicose veins of lower extremities with other complications 85/11/7739  . DEGENERATIVE JOINT DISEASE, KNEE 07/09/2008  . Diabetes mellitus with neuropathy ([place]) 07/03/2008  . Hyperlipemia 07/03/2008  . Human immunodeficiency virus (HIV) disease ([place]) 03/01/2007  . Hypertension 03/01/2007  . GERD 03/01/2007     Sigurd Sos, PT 01/31/18 9:28 AM  [facility] [address], [place], [place], [address] Phone: [phone]   Fax:  [phone]  Name: Trevor Fischer MRN: [id number] Date of Birth: [birth year]

## 2018-02-04 ENCOUNTER — Ambulatory Visit: Payer: PRIVATE HEALTH INSURANCE | Admitting: Physical Therapy

## 2018-02-04 ENCOUNTER — Encounter: Payer: Self-pay | Admitting: Physical Therapy

## 2018-02-04 DIAGNOSIS — M5441 Lumbago with sciatica, right side: Secondary | ICD-10-CM

## 2018-02-04 DIAGNOSIS — M62830 Muscle spasm of back: Secondary | ICD-10-CM

## 2018-02-04 DIAGNOSIS — M6281 Muscle weakness (generalized): Secondary | ICD-10-CM | POA: Diagnosis not present

## 2018-02-04 NOTE — Therapy (Signed)
[facility] [address] [place], [place], [address] Phone: [phone]   Fax:  [phone]  Physical Therapy Treatment  Patient Details  Name: DELAN KSIAZEK MRN: [id number] Date of Birth: 1961/07/20 Referring Provider: Dr. Elta Guadeloupe L. Dumonski   Encounter Date: 02/04/2018 Progress Note Reporting Period 12/31/17 to 02/04/18  See note below for Objective Data and Assessment of Progress/Goals.   Sigurd Sos, PT 02/04/18 11:18 AM    PT End of Session - 02/04/18 0755    Visit Number  20    Date for PT Re-Evaluation  02/15/18    Authorization Type  W/C- 25 total approved    Authorization - Visit Number  20    Authorization - Number of Visits  25    PT Start Time  0932    PT Stop Time  0855    PT Time Calculation (min)  60 min    Activity Tolerance  Patient tolerated treatment well    Behavior During Therapy  [facility] for tasks assessed/performed       Past Medical History:  Diagnosis Date  . Allergic rhinitis   . Degenerative joint disease of knee   . DVT (deep venous thrombosis) (HCC) RLE X 2   Ended anticoagulation in 2012  . GERD (gastroesophageal reflux disease)   . 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 HIV infection ([place])   . Hx MRSA infection   . Hyperlipidemia   . Hypertension   . Pulmonary embolism ([place]) 05/14/2017  . Superficial thrombophlebitis   . Type II diabetes mellitus ([place]) 9/09    Past Surgical History:  Procedure Laterality Date  . ENDOVENOUS ABLATION SAPHENOUS VEIN W/ LASER  07-17-2011 LEFT GRERATER SAPHENOUS VEIN AND STAB PHLEBECTOMIES   10-20   LEFT LEG  . VEIN LIGATION AND STRIPPING Bilateral     There were no vitals filed for this visit.  Subjective Assessment - 02/04/18 0756    Subjective  I am doing great, occasional foot numbness.     Diagnostic tests  MRI showed 2 bulging disc on a nerve    Currently in Pain?  No/denies    Multiple Pain Sites  No                        [facility] Adult PT Treatment/Exercise - 02/04/18 0001      Lumbar Exercises: Aerobic   Nustep  L3 x 10 min PT present to discuss onset of LBP      Lumbar Exercises: Machines for Strengthening   Leg Press  Seat 9: Bil 130# 2x15, 75# RTLE 2x15      Lumbar Exercises: Standing   Row  Power tower;Both;20 reps 30#    Other Standing Lumbar Exercises  walking in reverse with abdominal bracing: 30# 3x10      Lumbar Exercises: Quadruped   Plank  -- modified on forearms 2x 10-20 sec, then with legs straight       Traction   Type of Traction  Lumbar    Min (lbs)  60    Max (lbs)  125    Hold Time  60    Rest Time  10    Time  15               PT Short Term Goals - 12/19/17 0830      PT SHORT TERM GOAL #2   Title  able to centralize the pain into his lumbar with  lateral shifting    Status  Achieved      PT SHORT TERM GOAL #3   Title  ability to demonstrate correct body mechanics with daily tasks to decrease strain on back    Status  Achieved        PT Long Term Goals - 01/17/18 0904      PT LONG TERM GOAL #1   Title  independent with HEP and understand how to progress himself    Baseline  independent in current HEP    Time  4    Period  Weeks    Status  On-going    Target Date  02/15/18      PT LONG TERM GOAL #2   Title  report a 95% reduction in Rt LE radiculopathy     Baseline  constant Rt LE pain, 75% improvement in intensity    Time  4    Period  Weeks    Status  On-going    Target Date  02/15/18      PT LONG TERM GOAL #3   Title  able to tolerate work tasks with pain decreased >/= 75% due to abdominal bracing and centralization of pain    Baseline  90%    Status  Achieved      PT LONG TERM GOAL #4   Title  able to sit without difficulty due to pain minimal to none on bilateral buttocks    Status  Achieved      PT LONG TERM GOAL #5   Title  FOTO score </= 41% limitation    Baseline  39% limitation     Status  Achieved       Additional Long Term Goals   Additional Long Term Goals  Yes      PT LONG TERM GOAL #6   Title  demonstrate 4+/5 to 5/5 Rt hip strength to improve safety and endurance at work and in the community    Time  4    Period  Weeks    Status  New    Target Date  02/15/18            Plan - 02/04/18 0755    Clinical Impression Statement  Pt continues to do well in general. Pt reports he only feels the numbness in his foot if he "over does it." He defines overdoing it as being in the yard all day or too much standing. He gives himself a 90% improvement rating.  He is semi-compliant with his HEP per his report today. He will see the MD next Monday.  Reduced the pull on the traction today as last session the belts could not stablilize the pt's body. 125# worked well today.      Rehab Potential  Excellent    PT Frequency  2x / week    PT Duration  4 weeks    PT Treatment/Interventions  Iontophoresis 4mg /ml Dexamethasone;Cryotherapy;Electrical Stimulation;Moist Heat;Traction;Ultrasound;Therapeutic exercise;Therapeutic activities;Neuromuscular re-education;Patient/family education;Manual techniques    PT Next Visit Plan  Traction, core and hip strength     Consulted and Agree with Plan of Care  Patient       Patient will benefit from skilled therapeutic intervention in order to improve the following deficits and impairments:  Increased fascial restricitons, Pain, Decreased mobility, Increased muscle spasms, Decreased strength, Decreased range of motion, Decreased endurance, Decreased activity tolerance, Impaired flexibility  Visit Diagnosis: Muscle weakness (generalized)  Acute right-sided low back pain with right-sided sciatica  Muscle spasm of back  Problem List Patient Active Problem List   Diagnosis Date Noted  . Lumbar radiculopathy, right 12/10/2017  . Acute paronychia of toe of right foot 08/03/2017  . Left shoulder pain 06/25/2017  . Dyspnea on exertion 05/14/2017  .  Personal history of PE (pulmonary embolism) 05/14/2017  . Erectile dysfunction 10/11/2016  . Daytime somnolence 02/20/2016  . Advanced care planning/counseling discussion 02/20/2016  . Cough 09/14/2015  . Back pain 07/26/2015  . Flatulence 05/13/2015  . 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 Trigger thumb of right hand 05/08/2012  . Varicose veins of lower extremities with other complications 16/07/9603  . DEGENERATIVE JOINT DISEASE, KNEE 07/09/2008  . Diabetes mellitus with neuropathy ([place]) 07/03/2008  . Hyperlipemia 07/03/2008  . Human immunodeficiency virus (HIV) disease ([place]) 03/01/2007  . Hypertension 03/01/2007  . GERD 03/01/2007   Myrene Galas, PTA 02/04/18 11:17 AM   [facility] [address] [place], [place], [address] Phone: [phone]   Fax:  [phone]  Name: DAJAUN GOLDRING MRN: [id number] Date of Birth: September 20, 1961

## 2018-02-05 ENCOUNTER — Other Ambulatory Visit: Payer: Self-pay | Admitting: Internal Medicine

## 2018-02-05 DIAGNOSIS — M5441 Lumbago with sciatica, right side: Principal | ICD-10-CM

## 2018-02-05 DIAGNOSIS — G8929 Other chronic pain: Secondary | ICD-10-CM

## 2018-02-05 MED FILL — BACLOFEN 10 MG TABLET: 10 | 30 days supply | Qty: 90 | Fill #0

## 2018-02-05 MED FILL — SIMETHICONE 180 MG SOFTGEL: 180 | 80 days supply | Qty: 240 | Fill #1

## 2018-02-05 MED FILL — HYDROCODON-APAP 5-325: 5-325 | 15 days supply | Qty: 30 | Fill #0

## 2018-02-05 MED FILL — ULTICARE ALCOHOL SWABS 70 %: 70 | 25 days supply | Qty: 100 | Fill #2

## 2018-02-05 MED FILL — VICTOZA 2-PAK 18 MG/3 ML PE: 18 | 30 days supply | Qty: 6 | Fill #2

## 2018-02-06 ENCOUNTER — Ambulatory Visit: Payer: PRIVATE HEALTH INSURANCE | Admitting: Physical Therapy

## 2018-02-06 ENCOUNTER — Encounter: Payer: Self-pay | Admitting: Physical Therapy

## 2018-02-06 DIAGNOSIS — M62830 Muscle spasm of back: Secondary | ICD-10-CM

## 2018-02-06 DIAGNOSIS — M6281 Muscle weakness (generalized): Secondary | ICD-10-CM | POA: Diagnosis not present

## 2018-02-06 DIAGNOSIS — M5441 Lumbago with sciatica, right side: Secondary | ICD-10-CM

## 2018-02-06 NOTE — Therapy (Addendum)
[facility] [address] [place], [place], [address] Phone: [phone]   Fax:  [phone]  Physical Therapy Treatment  Patient Details  Name: Trevor Fischer MRN: [id number] Date of Birth: [birth year] Referring Provider: Dr. Elta Guadeloupe L. Dumonski   Encounter Date: 02/06/2018  PT End of Session - 02/06/18 0757    Visit Number  21    Date for PT Re-Evaluation  02/15/18    Authorization Type  W/C- 25 total approved    Authorization - Visit Number  21    Authorization - Number of Visits  25    PT Start Time  0756    PT Stop Time  0850    PT Time Calculation (min)  54 min    Activity Tolerance  Patient tolerated treatment well    Behavior During Therapy  WFL for tasks assessed/performed       Past Medical History:  Diagnosis Date  . Allergic rhinitis   . Degenerative joint disease of knee   . DVT (deep venous thrombosis) (HCC) RLE X 2   Ended anticoagulation in 2012  . GERD (gastroesophageal reflux disease)   . 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 HIV infection ([place])   . Hx MRSA infection   . Hyperlipidemia   . Hypertension   . Pulmonary embolism ([place]) 05/14/2017  . Superficial thrombophlebitis   . Type II diabetes mellitus ([place]) 9/09    Past Surgical History:  Procedure Laterality Date  . ENDOVENOUS ABLATION SAPHENOUS VEIN W/ LASER  07-17-2011 LEFT GRERATER SAPHENOUS VEIN AND STAB PHLEBECTOMIES   10-20   LEFT LEG  . VEIN LIGATION AND STRIPPING Bilateral     There were no vitals filed for this visit.  Subjective Assessment - 02/06/18 0800    Subjective  No new complaints this AM. Foot is slightly numb this AM.     Diagnostic tests  MRI showed 2 bulging disc on a nerve    Patient Stated Goals  move better and reduce pain    Currently in Pain?  No/denies    Multiple Pain Sites  No                       [facility] Adult PT Treatment/Exercise - 02/06/18 0001      Lumbar Exercises:  Stretches   Active Hamstring Stretch  Right;Left;2 reps;30 seconds      Lumbar Exercises: Aerobic   Nustep  L3 x 10 min PT present to discuss onset of LBP      Lumbar Exercises: Machines for Strengthening   Leg Press  Seat 9: Bil 135# 3x10, RTLE 75# 3x10,       Lumbar Exercises: Standing   Row  Power tower;Both;20 reps 30#, reduced wt to 25# for improved form    Other Standing Lumbar Exercises  walking in reverse with abdominal bracing: 30# 3x10      Lumbar Exercises: Quadruped   Plank  -- modified on forearms 2x20 sec, then with legs straight 2x10       Traction   Type of Traction  Lumbar    Min (lbs)  60    Max (lbs)  125    Hold Time  60    Rest Time  10    Time  15               PT Short Term Goals - 12/19/17 0830      PT SHORT TERM GOAL #2  Title  able to centralize the pain into his lumbar with lateral shifting    Status  Achieved      PT SHORT TERM GOAL #3   Title  ability to demonstrate correct body mechanics with daily tasks to decrease strain on back    Status  Achieved        PT Long Term Goals - 02/06/18 0915      PT LONG TERM GOAL #1   Title  independent with HEP and understand how to progress himself    Baseline  independent in current HEP    Time  4    Period  Weeks    Status  On-going      PT LONG TERM GOAL #2   Title  report a 95% reduction in Rt LE radiculopathy     Time  4    Period  Weeks    Status  On-going 90%      PT LONG TERM GOAL #3   Title  able to tolerate work tasks with pain decreased >/= 75% due to abdominal bracing and centralization of pain    Baseline  90%    Time  4    Period  Weeks    Status  Achieved      PT LONG TERM GOAL #4   Title  able to sit without difficulty due to pain minimal to none on bilateral buttocks    Baseline  no longer needs to sit in the recliner    Time  4    Period  Weeks    Status  Achieved      PT LONG TERM GOAL #5   Title  FOTO score </= 41% limitation    Baseline  39% limitation      Time  5    Period  Weeks    Status  Achieved      PT LONG TERM GOAL #6   Title  demonstrate 4+/5 to 5/5 Rt hip strength to improve safety and endurance at work and in the community    Time  4    Period  Weeks    Status  On-going            Plan - 02/06/18 0758    Clinical Impression Statement  Pt has not tried planks at home. Pt needs verbal cuing during his rows as he still elevates his shoulders too much. Increased leg press weight for leg strength, tolerated well.  Pt continues to tolerate the 125# on traction. To MD next week.     Rehab Potential  Excellent    PT Frequency  2x / week    PT Duration  4 weeks    PT Treatment/Interventions  Iontophoresis 4mg /ml Dexamethasone;Cryotherapy;Electrical Stimulation;Moist Heat;Traction;Ultrasound;Therapeutic exercise;Therapeutic activities;Neuromuscular re-education;Patient/family education;Manual techniques    PT Next Visit Plan  Traction, core and hip strength     Consulted and Agree with Plan of Care  Patient       Patient will benefit from skilled therapeutic intervention in order to improve the following deficits and impairments:  Increased fascial restricitons, Pain, Decreased mobility, Increased muscle spasms, Decreased strength, Decreased range of motion, Decreased endurance, Decreased activity tolerance, Impaired flexibility  Visit Diagnosis: Muscle weakness (generalized)  Acute right-sided low back pain with right-sided sciatica  Muscle spasm of back     Problem List Patient Active Problem List   Diagnosis Date Noted  . Lumbar radiculopathy, right 12/10/2017  . Acute paronychia of toe of right foot 08/03/2017  .  Left shoulder pain 06/25/2017  . Dyspnea on exertion 05/14/2017  . Personal history of PE (pulmonary embolism) 05/14/2017  . Erectile dysfunction 10/11/2016  . Daytime somnolence 02/20/2016  . Advanced care planning/counseling discussion 02/20/2016  . Cough 09/14/2015  . Back pain 07/26/2015  .  Flatulence 05/13/2015  . 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 Trigger thumb of right hand 05/08/2012  . Varicose veins of lower extremities with other complications 96/01/5408  . DEGENERATIVE JOINT DISEASE, KNEE 07/09/2008  . Diabetes mellitus with neuropathy ([place]) 07/03/2008  . Hyperlipemia 07/03/2008  . Human immunodeficiency virus (HIV) disease ([place]) 03/01/2007  . Hypertension 03/01/2007  . GERD 03/01/2007    Virgel Haro, PTA 02/06/2018, 9:17 AM  [facility] [address] [place], [place], [address] Phone: [phone]   Fax:  [phone]  Name: ANTAVIUS SPERBECK MRN: [id number] Date of Birth: 1961-08-21

## 2018-02-11 MED FILL — LANTUS SOLOSTAR 100 UNITS/M: 100 | 90 days supply | Qty: 21 | Fill #2

## 2018-02-13 ENCOUNTER — Telehealth: Payer: Self-pay

## 2018-02-13 ENCOUNTER — Other Ambulatory Visit: Payer: Self-pay | Admitting: Internal Medicine

## 2018-02-13 MED FILL — ELIQUIS 5 MG TABLET: 5 | 30 days supply | Qty: 60 | Fill #1

## 2018-02-13 MED FILL — BIKTARVY 50-200-25 MG TABS: 50-200-25 | 30 days supply | Qty: 30 | Fill #4

## 2018-02-13 MED FILL — LOSARTAN POTASSIUM 50 MG TA: 50 | 90 days supply | Qty: 90 | Fill #0

## 2018-02-13 NOTE — Telephone Encounter (Signed)
Spoke w/ pharm, VO #[phone] w/ 3 refills, verified w/ dr rice and ask him to change medlist

## 2018-02-13 NOTE — Telephone Encounter (Signed)
Trevor Fischer with outpatient pharmacy requesting to speak with a nurse about a direction on Losartan. Please call back.

## 2018-02-14 ENCOUNTER — Ambulatory Visit: Payer: PRIVATE HEALTH INSURANCE

## 2018-02-14 DIAGNOSIS — M62830 Muscle spasm of back: Secondary | ICD-10-CM

## 2018-02-14 DIAGNOSIS — M5441 Lumbago with sciatica, right side: Secondary | ICD-10-CM

## 2018-02-14 DIAGNOSIS — M6281 Muscle weakness (generalized): Secondary | ICD-10-CM | POA: Diagnosis not present

## 2018-02-14 MED ORDER — LOSARTAN POTASSIUM 50 MG PO TABS: 50.0000 mg | ORAL_TABLET | Freq: Every day | ORAL | 3 refills | Status: DC

## 2018-02-14 NOTE — Therapy (Signed)
[facility] [address], [place], [place], [address] Phone: [phone]   Fax:  [phone]  Physical Therapy Treatment  Patient Details  Name: Trevor Fischer MRN: [id number] Date of Birth: 12/22/1960 Referring Provider: Dr. Elta Guadeloupe L. Dumonski   Encounter Date: 02/14/2018  PT End of Session - 02/14/18 0837    Visit Number  22    Date for PT Re-Evaluation  03/01/18    Authorization Type  W/C- 25 total approved    Authorization - Visit Number  48    Authorization - Number of Visits  25    PT Start Time  0750    PT Stop Time  0845    PT Time Calculation (min)  55 min    Activity Tolerance  Patient tolerated treatment well    Behavior During Therapy  WFL for tasks assessed/performed       Past Medical History:  Diagnosis Date  . Allergic rhinitis   . Degenerative joint disease of knee   . DVT (deep venous thrombosis) (HCC) RLE X 2   Ended anticoagulation in 2012  . GERD (gastroesophageal reflux disease)   . 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 HIV infection ([place])   . Hx MRSA infection   . Hyperlipidemia   . Hypertension   . Pulmonary embolism ([place]) 05/14/2017  . Superficial thrombophlebitis   . Type II diabetes mellitus ([place]) 9/09    Past Surgical History:  Procedure Laterality Date  . ENDOVENOUS ABLATION SAPHENOUS VEIN W/ LASER  07-17-2011 LEFT GRERATER SAPHENOUS VEIN AND STAB PHLEBECTOMIES   10-20   LEFT LEG  . VEIN LIGATION AND STRIPPING Bilateral     There were no vitals filed for this visit.  Subjective Assessment - 02/14/18 0757    Subjective  I am 95% better overall.  The MD released me to work full duty.  I am still working on my core and leg strength as this is still limited.      Patient Stated Goals  move better and reduce pain    Currently in Pain?  No/denies         [facility] PT Assessment - 02/14/18 0001      Assessment   Medical Diagnosis  Lumbar radiculopathy      Prior  Function   Level of Independence  Independent    Vocation  Full time employment      Cognition   Overall Cognitive Status  Within Functional Limits for tasks assessed      Observation/Other Assessments   Focus on Therapeutic Outcomes (FOTO)   39% limitation      Strength   Overall Strength  Deficits    Strength Assessment Site  Hip    Right/Left Hip  Right    Right Hip Flexion  4+/5    Right Hip Extension  4/5    Right Hip ABduction  4+/5                   [facility] Adult PT Treatment/Exercise - 02/14/18 0001      Lumbar Exercises: Stretches   Double Knee to Chest Stretch  3 reps;20 seconds      Lumbar Exercises: Aerobic   Nustep  L3 x 10 min PT present to discuss progress toward goals      Lumbar Exercises: Machines for Strengthening   Leg Press  Seat 9: Bil 135# 3x10, RTLE 75# 3x10,       Lumbar Exercises: Standing  Other Standing Lumbar Exercises  walking in reverse with abdominal bracing: 30# 3x10      Knee/Hip Exercises: Supine   Bridges  Strengthening;2 sets;10 reps green ball under legs      Knee/Hip Exercises: Sidelying   Hip ABduction  Strengthening;Right;2 sets;10 reps      Knee/Hip Exercises: Prone   Hip Extension  Strengthening;Both;2 sets;10 reps      Traction   Type of Traction  Lumbar    Min (lbs)  60    Max (lbs)  125    Hold Time  60    Rest Time  10    Time  15               PT Short Term Goals - 12/19/17 0830      PT SHORT TERM GOAL #2   Title  able to centralize the pain into his lumbar with lateral shifting    Status  Achieved      PT SHORT TERM GOAL #3   Title  ability to demonstrate correct body mechanics with daily tasks to decrease strain on back    Status  Achieved        PT Long Term Goals - 02/14/18 0801      PT LONG TERM GOAL #1   Title  independent with HEP and understand how to progress himself    Time  2    Period  Weeks    Status  On-going    Target Date  03/01/18      PT LONG TERM GOAL #2    Title  report a 95% reduction in Rt LE radiculopathy     Baseline  this varies 80-90% and this is now intermittent    Time  2    Period  Weeks    Status  On-going    Target Date  03/01/18      PT LONG TERM GOAL #3   Title  able to tolerate work tasks with pain decreased >/= 75% due to abdominal bracing and centralization of pain    Status  Achieved      PT LONG TERM GOAL #4   Title  able to sit without difficulty due to pain minimal to none on bilateral buttocks    Status  Achieved      PT LONG TERM GOAL #5   Title  FOTO score </= 41% limitation    Status  Achieved      PT LONG TERM GOAL #6   Title  demonstrate 4+/5 to 5/5 Rt hip strength to improve safety and endurance at work and in the community    Baseline  4 to 4+/5    Time  2    Period  Weeks    Status  On-going    Target Date  03/01/18            Plan - 02/14/18 0811    Clinical Impression Statement  Pt has made steady progress with PT.  Pt reports 80-90% reduction in Rt LE radiculopathy and this is now intermittent.  Pt as been released to full work duty.  Pt remains weak in the Rt hip although this is improving.  Session focused on hip strength today.  Pt will attend 3 more sessions to focus on gluteal and core strength and advancement of HEP.      Rehab Potential  Excellent    PT Frequency  2x / week    PT Duration  2 weeks  PT Treatment/Interventions  Iontophoresis 4mg /ml Dexamethasone;Cryotherapy;Electrical Stimulation;Moist Heat;Traction;Ultrasound;Therapeutic exercise;Therapeutic activities;Neuromuscular re-education;Patient/family education;Manual techniques    PT Next Visit Plan  3 more sessions.  Hip and core strength progression.      Consulted and Agree with Plan of Care  Patient       Patient will benefit from skilled therapeutic intervention in order to improve the following deficits and impairments:  Increased fascial restricitons, Pain, Decreased mobility, Increased muscle spasms, Decreased  strength, Decreased range of motion, Decreased endurance, Decreased activity tolerance, Impaired flexibility  Visit Diagnosis: Muscle weakness (generalized) - Plan: PT plan of care cert/re-cert  Acute right-sided low back pain with right-sided sciatica - Plan: PT plan of care cert/re-cert  Muscle spasm of back - Plan: PT plan of care cert/re-cert     Problem List Patient Active Problem List   Diagnosis Date Noted  . Lumbar radiculopathy, right 12/10/2017  . Acute paronychia of toe of right foot 08/03/2017  . Left shoulder pain 06/25/2017  . Dyspnea on exertion 05/14/2017  . Personal history of PE (pulmonary embolism) 05/14/2017  . Erectile dysfunction 10/11/2016  . Daytime somnolence 02/20/2016  . Advanced care planning/counseling discussion 02/20/2016  . Cough 09/14/2015  . Back pain 07/26/2015  . Flatulence 05/13/2015  . 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 Trigger thumb of right hand 05/08/2012  . Varicose veins of lower extremities with other complications 11/01/4386  . DEGENERATIVE JOINT DISEASE, KNEE 07/09/2008  . Diabetes mellitus with neuropathy ([place]) 07/03/2008  . Hyperlipemia 07/03/2008  . Human immunodeficiency virus (HIV) disease ([place]) 03/01/2007  . Hypertension 03/01/2007  . GERD 03/01/2007    Sigurd Sos, PT 02/14/18 8:41 AM  [facility] [address] [place], [place], [address] Phone: [phone]   Fax:  [phone]  Name: PHOENIX DRESSER MRN: [id number] Date of Birth: 1961/02/19

## 2018-02-18 ENCOUNTER — Ambulatory Visit: Payer: PRIVATE HEALTH INSURANCE | Admitting: Physical Therapy

## 2018-02-18 ENCOUNTER — Encounter: Payer: Self-pay | Admitting: Physical Therapy

## 2018-02-18 DIAGNOSIS — M5441 Lumbago with sciatica, right side: Secondary | ICD-10-CM

## 2018-02-18 DIAGNOSIS — M6281 Muscle weakness (generalized): Secondary | ICD-10-CM | POA: Diagnosis not present

## 2018-02-18 DIAGNOSIS — M62830 Muscle spasm of back: Secondary | ICD-10-CM

## 2018-02-18 NOTE — Patient Instructions (Signed)
Hip Extension: 2-4 Inches    Tighten gluteal muscle and lift your abdominals. Lift right leg __10_ times. Restabilize pelvis.  Keep pelvis still. Be sure pelvis does not rotate and back does not arch. Do _2__ sets, __1-2_ times per day.  http://ss.exer.us/62   Copyright  VHI. All rights reserved.   HIP: Abduction - Side-Lying    Lie on left side, top leg straight and I want you to bend your bottom knee and in line with trunk. Lift your bottom waist and engage your lower abdominals.Lift RT  top leg up and slightly back. Point toes forward. Lift leg up and down slowly with your outter glute muscle.  __10_ reps per set, __2_ sets per day.  Bend bottom leg to stabilize pelvis.  Copyright  VHI. All rights reserved.   #3 Single leg balance on the RT leg. Sprinke this into your day to strengthen the entire RT leg. Squeeze the RT glutes to keep your pelvis from dipping.

## 2018-02-18 NOTE — Therapy (Signed)
[facility] [address], [place], [place], [address] Phone: [phone]   Fax:  [phone]  Physical Therapy Treatment  Patient Details  Name: Trevor Fischer MRN: [id number] Date of Birth: 11/25/1960 Referring Provider: Dr. Elta Guadeloupe L. Dumonski   Encounter Date: 02/18/2018  PT End of Session - 02/18/18 0756    Visit Number  23    Date for PT Re-Evaluation  03/01/18    Authorization Type  W/C- 25 total approved    Authorization - Visit Number  23    Authorization - Number of Visits  25    PT Start Time  0751    PT Stop Time  0850    PT Time Calculation (min)  59 min    Activity Tolerance  Patient tolerated treatment well    Behavior During Therapy  [facility] for tasks assessed/performed       Past Medical History:  Diagnosis Date  . Allergic rhinitis   . Degenerative joint disease of knee   . DVT (deep venous thrombosis) (HCC) RLE X 2   Ended anticoagulation in 2012  . GERD (gastroesophageal reflux disease)   . 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 HIV infection ([place])   . Hx MRSA infection   . Hyperlipidemia   . Hypertension   . Pulmonary embolism ([place]) 05/14/2017  . Superficial thrombophlebitis   . Type II diabetes mellitus ([place]) 9/09    Past Surgical History:  Procedure Laterality Date  . ENDOVENOUS ABLATION SAPHENOUS VEIN W/ LASER  07-17-2011 LEFT GRERATER SAPHENOUS VEIN AND STAB PHLEBECTOMIES   10-20   LEFT LEG  . VEIN LIGATION AND STRIPPING Bilateral     There were no vitals filed for this visit.  Subjective Assessment - 02/18/18 0758    Subjective  95% improved as stated last session. Very pleased.    Diagnostic tests  MRI showed 2 bulging disc on a nerve    Patient Stated Goals  move better and reduce pain    Currently in Pain?  No/denies    Multiple Pain Sites  No                       [facility] Adult PT Treatment/Exercise - 02/18/18 0001      Lumbar Exercises: Aerobic    Nustep  L4 x 10 min PT present to discuss progress toward goals      Lumbar Exercises: Standing   Row  Power tower;Both;20 reps 30#, reduced wt to 25# for improved form    Other Standing Lumbar Exercises  walking in reverse with abdominal bracing: 30# 3x10      Lumbar Exercises: Quadruped   Plank  -- modified on forearms 2x20 sec, then with legs straight 2x10       Knee/Hip Exercises: Standing   SLS  On black square 3x 10 sec VC for proprioception/feeling his hip stabilize    Walking with Sports Cord  Rt side stepping 25# 10x      Knee/Hip Exercises: Sidelying   Hip ABduction  Strengthening;Right;2 sets;10 reps 0#, TC for technique      Knee/Hip Exercises: Prone   Hip Extension  Strengthening;Both;2 sets;10 reps      Traction   Type of Traction  Lumbar    Min (lbs)  60    Max (lbs)  125    Hold Time  60    Rest Time  10    Time  15  PT Education - 02/18/18 [phone]    Education provided  Yes    Education Details  Hip abd, ext for HEP    Person(s) Educated  Patient    Methods  Explanation;Demonstration;Tactile cues;Verbal cues;Handout    Comprehension  Returned demonstration;Verbalized understanding       PT Short Term Goals - 12/19/17 0830      PT SHORT TERM GOAL #2   Title  able to centralize the pain into his lumbar with lateral shifting    Status  Achieved      PT SHORT TERM GOAL #3   Title  ability to demonstrate correct body mechanics with daily tasks to decrease strain on back    Status  Achieved        PT Long Term Goals - 02/14/18 0801      PT LONG TERM GOAL #1   Title  independent with HEP and understand how to progress himself    Time  2    Period  Weeks    Status  On-going    Target Date  03/01/18      PT LONG TERM GOAL #2   Title  report a 95% reduction in Rt LE radiculopathy     Baseline  this varies 80-90% and this is now intermittent    Time  2    Period  Weeks    Status  On-going    Target Date  03/01/18      PT LONG  TERM GOAL #3   Title  able to tolerate work tasks with pain decreased >/= 75% due to abdominal bracing and centralization of pain    Status  Achieved      PT LONG TERM GOAL #4   Title  able to sit without difficulty due to pain minimal to none on bilateral buttocks    Status  Achieved      PT LONG TERM GOAL #5   Title  FOTO score </= 41% limitation    Status  Achieved      PT LONG TERM GOAL #6   Title  demonstrate 4+/5 to 5/5 Rt hip strength to improve safety and endurance at work and in the community    Baseline  4 to 4+/5    Time  2    Period  Weeks    Status  On-going    Target Date  03/01/18            Plan - 02/18/18 0757    Clinical Impression Statement  Continued to work on hip and core strength. Pt required verbal and tactile cuing for proper alignment and core engagement for hip strength.     Rehab Potential  Excellent    PT Frequency  2x / week    PT Duration  2 weeks    PT Treatment/Interventions  Iontophoresis 4mg /ml Dexamethasone;Cryotherapy;Electrical Stimulation;Moist Heat;Traction;Ultrasound;Therapeutic exercise;Therapeutic activities;Neuromuscular re-education;Patient/family education;Manual techniques    PT Next Visit Plan  DC next week.     PT Home Exercise Plan  progress [place] exercise    Consulted and Agree with Plan of Care  Patient       Patient will benefit from skilled therapeutic intervention in order to improve the following deficits and impairments:  Increased fascial restricitons, Pain, Decreased mobility, Increased muscle spasms, Decreased strength, Decreased range of motion, Decreased endurance, Decreased activity tolerance, Impaired flexibility  Visit Diagnosis: Muscle weakness (generalized)  Acute right-sided low back pain with right-sided sciatica  Muscle spasm of back  Problem List Patient Active Problem List   Diagnosis Date Noted  . Lumbar radiculopathy, right 12/10/2017  . Acute paronychia of toe of right foot  08/03/2017  . Left shoulder pain 06/25/2017  . Dyspnea on exertion 05/14/2017  . Personal history of PE (pulmonary embolism) 05/14/2017  . Erectile dysfunction 10/11/2016  . Daytime somnolence 02/20/2016  . Advanced care planning/counseling discussion 02/20/2016  . Cough 09/14/2015  . Back pain 07/26/2015  . Flatulence 05/13/2015  . 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 Trigger thumb of right hand 05/08/2012  . Varicose veins of lower extremities with other complications 38/07/1750  . DEGENERATIVE JOINT DISEASE, KNEE 07/09/2008  . Diabetes mellitus with neuropathy ([place]) 07/03/2008  . Hyperlipemia 07/03/2008  . Human immunodeficiency virus (HIV) disease ([place]) 03/01/2007  . Hypertension 03/01/2007  . GERD 03/01/2007    Kelsee Preslar, PTA 02/18/2018, 8:44 AM  [facility] [address], [place], [place], [address] Phone: [phone]   Fax:  [phone]  Name: Trevor Fischer MRN: [id number] Date of Birth: [birth year]

## 2018-02-19 ENCOUNTER — Other Ambulatory Visit: Payer: Self-pay | Admitting: Internal Medicine

## 2018-02-19 DIAGNOSIS — Z794 Long term (current) use of insulin: Principal | ICD-10-CM

## 2018-02-19 DIAGNOSIS — E1140 Type 2 diabetes mellitus with diabetic neuropathy, unspecified: Secondary | ICD-10-CM

## 2018-02-19 MED FILL — ACCU-CHEK GUIDE STRP: 25 days supply | Qty: 100 | Fill #3

## 2018-02-19 MED FILL — UNIFINE PENTIPS 32GX5/32: 32G X 4 MM | 33 days supply | Qty: 200 | Fill #0

## 2018-02-19 MED FILL — UNIFINE PENTIPS 32GX5/32": 32G X 4 MM | 33 days supply | Qty: 200 | Fill #0

## 2018-02-20 ENCOUNTER — Ambulatory Visit: Payer: PRIVATE HEALTH INSURANCE

## 2018-02-20 DIAGNOSIS — M6281 Muscle weakness (generalized): Secondary | ICD-10-CM | POA: Diagnosis not present

## 2018-02-20 DIAGNOSIS — M5441 Lumbago with sciatica, right side: Secondary | ICD-10-CM

## 2018-02-20 DIAGNOSIS — M62830 Muscle spasm of back: Secondary | ICD-10-CM

## 2018-02-20 NOTE — Therapy (Signed)
[facility] [address] [place], [place], [address] Phone: [phone]   Fax:  [phone]  Physical Therapy Treatment  Patient Details  Name: Trevor Fischer MRN: [id number] Date of Birth: Aug 15, 1961 Referring Provider: Dr. Elta Guadeloupe L. Dumonski   Encounter Date: 02/20/2018  PT End of Session - 02/20/18 0834    Visit Number  24    Date for PT Re-Evaluation  03/01/18    Authorization Type  W/C- 25 total approved    Authorization - Visit Number  24    Authorization - Number of Visits  25    PT Start Time  0752    PT Stop Time  0852    PT Time Calculation (min)  60 min    Activity Tolerance  Patient tolerated treatment well    Behavior During Therapy  [facility] for tasks assessed/performed       Past Medical History:  Diagnosis Date  . Allergic rhinitis   . Degenerative joint disease of knee   . DVT (deep venous thrombosis) (HCC) RLE X 2   Ended anticoagulation in 2012  . GERD (gastroesophageal reflux disease)   . 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 HIV infection ([place])   . Hx MRSA infection   . Hyperlipidemia   . Hypertension   . Pulmonary embolism ([place]) 05/14/2017  . Superficial thrombophlebitis   . Type II diabetes mellitus ([place]) 9/09    Past Surgical History:  Procedure Laterality Date  . ENDOVENOUS ABLATION SAPHENOUS VEIN W/ LASER  07-17-2011 LEFT GRERATER SAPHENOUS VEIN AND STAB PHLEBECTOMIES   10-20   LEFT LEG  . VEIN LIGATION AND STRIPPING Bilateral     There were no vitals filed for this visit.  Subjective Assessment - 02/20/18 0800    Subjective  I am not taking pain medication and I am not hurting much.     Currently in Pain?  Yes    Pain Score  1     Pain Location  Back    Pain Orientation  Right    Pain Descriptors / Indicators  Aching    Pain Type  Chronic pain    Pain Onset  More than a month ago    Pain Frequency  Constant    Aggravating Factors   work, walking, activity     Pain Relieving Factors  Gabapentin, stretching, rest                       [facility] Adult PT Treatment/Exercise - 02/20/18 0001      Lumbar Exercises: Aerobic   Nustep  L4 x 10 min PT present to discuss progress toward goals      Lumbar Exercises: Standing   Row  Power tower;Both;20 reps 30#, reduced wt to 25# for improved form    Other Standing Lumbar Exercises  walking in reverse with abdominal bracing: 30# 3x10      Knee/Hip Exercises: Standing   Walking with Sports Cord  4 ways: 25# with tactile cues for abdominal engagement      Knee/Hip Exercises: Sidelying   Hip ABduction  Strengthening;Right;2 sets;10 reps 0#, TC for technique      Knee/Hip Exercises: Prone   Hip Extension  Strengthening;Both;2 sets;10 reps      Traction   Type of Traction  Lumbar    Min (lbs)  60    Max (lbs)  125    Hold Time  60    Rest Time  10    Time  15               PT Short Term Goals - 12/19/17 0830      PT SHORT TERM GOAL #2   Title  able to centralize the pain into his lumbar with lateral shifting    Status  Achieved      PT SHORT TERM GOAL #3   Title  ability to demonstrate correct body mechanics with daily tasks to decrease strain on back    Status  Achieved        PT Long Term Goals - 02/14/18 0801      PT LONG TERM GOAL #1   Title  independent with HEP and understand how to progress himself    Time  2    Period  Weeks    Status  On-going    Target Date  03/01/18      PT LONG TERM GOAL #2   Title  report a 95% reduction in Rt LE radiculopathy     Baseline  this varies 80-90% and this is now intermittent    Time  2    Period  Weeks    Status  On-going    Target Date  03/01/18      PT LONG TERM GOAL #3   Title  able to tolerate work tasks with pain decreased >/= 75% due to abdominal bracing and centralization of pain    Status  Achieved      PT LONG TERM GOAL #4   Title  able to sit without difficulty due to pain minimal to none on bilateral  buttocks    Status  Achieved      PT LONG TERM GOAL #5   Title  FOTO score </= 41% limitation    Status  Achieved      PT LONG TERM GOAL #6   Title  demonstrate 4+/5 to 5/5 Rt hip strength to improve safety and endurance at work and in the community    Baseline  4 to 4+/5    Time  2    Period  Weeks    Status  On-going    Target Date  03/01/18            Plan - 02/20/18 0805    Clinical Impression Statement  Pt reports 90-95% reduction in Rt LE radiculopathy and describes pain as intermittent.  Pt reports reduced lumbar strain with weight training exercises today.  Pt requires verbal cues for posture and core activation.  Pt will attend 1 more session to finalize HEP and 1 more session of traction.       Rehab Potential  Excellent    PT Frequency  2x / week    PT Duration  2 weeks    PT Treatment/Interventions  Iontophoresis 4mg /ml Dexamethasone;Cryotherapy;Electrical Stimulation;Moist Heat;Traction;Ultrasound;Therapeutic exercise;Therapeutic activities;Neuromuscular re-education;Patient/family education;Manual techniques    PT Next Visit Plan  1 more session.  D/C, finalize HEP and traction.      Consulted and Agree with Plan of Care  Patient       Patient will benefit from skilled therapeutic intervention in order to improve the following deficits and impairments:  Increased fascial restricitons, Pain, Decreased mobility, Increased muscle spasms, Decreased strength, Decreased range of motion, Decreased endurance, Decreased activity tolerance, Impaired flexibility  Visit Diagnosis: Muscle weakness (generalized)  Acute right-sided low back pain with right-sided sciatica  Muscle spasm of back     Problem List Patient Active Problem List  Diagnosis Date Noted  . Lumbar radiculopathy, right 12/10/2017  . Acute paronychia of toe of right foot 08/03/2017  . Left shoulder pain 06/25/2017  . Dyspnea on exertion 05/14/2017  . Personal history of PE (pulmonary embolism)  05/14/2017  . Erectile dysfunction 10/11/2016  . Daytime somnolence 02/20/2016  . Advanced care planning/counseling discussion 02/20/2016  . Cough 09/14/2015  . Back pain 07/26/2015  . Flatulence 05/13/2015  . 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 Trigger thumb of right hand 05/08/2012  . Varicose veins of lower extremities with other complications 12/52/7129  . DEGENERATIVE JOINT DISEASE, KNEE 07/09/2008  . Diabetes mellitus with neuropathy ([place]) 07/03/2008  . Hyperlipemia 07/03/2008  . Human immunodeficiency virus (HIV) disease ([place]) 03/01/2007  . Hypertension 03/01/2007  . GERD 03/01/2007     Sigurd Sos, PT 02/20/18 8:39 AM  [facility] [address] [place], [place], [address] Phone: [phone]   Fax:  [phone]  Name: Trevor Fischer MRN: [id number] Date of Birth: 12/17/1960

## 2018-02-26 ENCOUNTER — Other Ambulatory Visit: Payer: Self-pay | Admitting: Infectious Disease

## 2018-02-26 ENCOUNTER — Other Ambulatory Visit: Payer: Self-pay | Admitting: Internal Medicine

## 2018-02-26 DIAGNOSIS — I10 Essential (primary) hypertension: Secondary | ICD-10-CM

## 2018-02-26 MED FILL — ULTICARE ALCOHOL SWABS PADS: 25 days supply | Qty: 100 | Fill #0

## 2018-02-26 MED FILL — HUMALOG 100 UNITS/ML KWIKPE: 100 | 38 days supply | Qty: 15 | Fill #5

## 2018-02-26 MED FILL — VERAPAMIL ER 240 MG TABLET: 240 | 90 days supply | Qty: 90 | Fill #0

## 2018-02-27 ENCOUNTER — Ambulatory Visit: Payer: PRIVATE HEALTH INSURANCE

## 2018-02-27 DIAGNOSIS — M62830 Muscle spasm of back: Secondary | ICD-10-CM

## 2018-02-27 DIAGNOSIS — M5441 Lumbago with sciatica, right side: Secondary | ICD-10-CM

## 2018-02-27 DIAGNOSIS — M6281 Muscle weakness (generalized): Secondary | ICD-10-CM | POA: Diagnosis not present

## 2018-02-27 NOTE — Therapy (Signed)
[facility] [address], [place], [place], [address] Phone: [phone]   Fax:  [phone]  Physical Therapy Treatment  Patient Details  Name: Trevor Fischer MRN: [id number] Date of Birth: September 02, 1961 Referring Provider: Dr. Elta Guadeloupe L. Dumonski   Encounter Date: 02/27/2018  PT End of Session - 02/27/18 0827    Visit Number  25    Authorization Type  W/C- 25 total approved    Authorization - Visit Number  25    Authorization - Number of Visits  25    PT Start Time  0174    PT Stop Time  0852    PT Time Calculation (min)  57 min    Activity Tolerance  Patient tolerated treatment well    Behavior During Therapy  WFL for tasks assessed/performed       Past Medical History:  Diagnosis Date  . Allergic rhinitis   . Degenerative joint disease of knee   . DVT (deep venous thrombosis) (HCC) RLE X 2   Ended anticoagulation in 2012  . GERD (gastroesophageal reflux disease)   . 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 HIV infection ([place])   . Hx MRSA infection   . Hyperlipidemia   . Hypertension   . Pulmonary embolism ([place]) 05/14/2017  . Superficial thrombophlebitis   . Type II diabetes mellitus ([place]) 9/09    Past Surgical History:  Procedure Laterality Date  . ENDOVENOUS ABLATION SAPHENOUS VEIN W/ LASER  07-17-2011 LEFT GRERATER SAPHENOUS VEIN AND STAB PHLEBECTOMIES   10-20   LEFT LEG  . VEIN LIGATION AND STRIPPING Bilateral     There were no vitals filed for this visit.  Subjective Assessment - 02/27/18 0801    Subjective  I'm 95% better overall.  Ready to D/C to HEP. I've been working a lot.      Currently in Pain?  No/denies         [facility] PT Assessment - 02/27/18 0001      Assessment   Medical Diagnosis  Lumbar radiculopathy      Prior Function   Level of Independence  Independent    Vocation  Full time employment      Cognition   Overall Cognitive Status  Within Functional Limits for  tasks assessed      Observation/Other Assessments   Focus on Therapeutic Outcomes (FOTO)   17% limitation      Strength   Overall Strength  Deficits    Strength Assessment Site  Hip    Right/Left Hip  Right    Right Hip Flexion  4+/5    Right Hip Extension  4/5    Right Hip ABduction  4+/5                   [facility] Adult PT Treatment/Exercise - 02/27/18 0001      Lumbar Exercises: Machines for Strengthening   Leg Press  Seat 9: Bil 135# 3x10, RTLE 75# 3x10,       Lumbar Exercises: Standing   Row  Power tower;Both;20 reps 30#, reduced wt to 25# for improved form    Other Standing Lumbar Exercises  walking in reverse with abdominal bracing: 30# 3x10      Knee/Hip Exercises: Standing   Walking with Sports Cord  4 ways: 25# with tactile cues for abdominal engagement      Knee/Hip Exercises: Sidelying   Hip ABduction  Strengthening;Right;2 sets;10 reps 0#, TC for technique  Knee/Hip Exercises: Prone   Hip Extension  Strengthening;Both;2 sets;10 reps      Traction   Type of Traction  Lumbar    Min (lbs)  60    Max (lbs)  125    Hold Time  60    Rest Time  10    Time  15               PT Short Term Goals - 12/19/17 0830      PT SHORT TERM GOAL #2   Title  able to centralize the pain into his lumbar with lateral shifting    Status  Achieved      PT SHORT TERM GOAL #3   Title  ability to demonstrate correct body mechanics with daily tasks to decrease strain on back    Status  Achieved        PT Long Term Goals - 02/27/18 0802      PT LONG TERM GOAL #1   Title  independent with HEP and understand how to progress himself    Status  Achieved      PT LONG TERM GOAL #2   Title  report a 95% reduction in Rt LE radiculopathy     Status  Achieved      PT LONG TERM GOAL #3   Title  able to tolerate work tasks with pain decreased >/= 75% due to abdominal bracing and centralization of pain    Status  Achieved      PT LONG TERM GOAL #4   Title   able to sit without difficulty due to pain minimal to none on bilateral buttocks    Status  Achieved      PT LONG TERM GOAL #5   Title  FOTO score </= 41% limitation    Baseline  17% limitation    Status  Achieved      PT LONG TERM GOAL #6   Title  demonstrate 4+/5 to 5/5 Rt hip strength to improve safety and endurance at work and in the community    Baseline  4 to 4+/5    Status  Partially Met            Plan - 02/27/18 0807    Clinical Impression Statement  Pt reports 95% overall improvement since the start of care.  Pt is independent in HEP for strength and flexibility and is applying body mechanics modifications at home and work.  Pt remains weak in the Rt hip and continues to work on strength exercises at home.  Pt will follow-up with MD 03/11/18.    PT Next Visit Plan  D/C PT to HEP    Consulted and Agree with Plan of Care  Patient       Patient will benefit from skilled therapeutic intervention in order to improve the following deficits and impairments:     Visit Diagnosis: Muscle weakness (generalized)  Acute right-sided low back pain with right-sided sciatica  Muscle spasm of back     Problem List Patient Active Problem List   Diagnosis Date Noted  . Lumbar radiculopathy, right 12/10/2017  . Acute paronychia of toe of right foot 08/03/2017  . Left shoulder pain 06/25/2017  . Dyspnea on exertion 05/14/2017  . Personal history of PE (pulmonary embolism) 05/14/2017  . Erectile dysfunction 10/11/2016  . Daytime somnolence 02/20/2016  . Advanced care planning/counseling discussion 02/20/2016  . Cough 09/14/2015  . Back pain 07/26/2015  . Flatulence 05/13/2015  . Lipodystrophy due to  HIV infection ([place]) 06/10/2012  . Trigger thumb of right hand 05/08/2012  . Varicose veins of lower extremities with other complications 32/67/1245  . DEGENERATIVE JOINT DISEASE, KNEE 07/09/2008  . Diabetes mellitus with neuropathy ([place]) 07/03/2008  . Hyperlipemia 07/03/2008   . Human immunodeficiency virus (HIV) disease ([place]) 03/01/2007  . Hypertension 03/01/2007  . GERD 03/01/2007   PHYSICAL THERAPY DISCHARGE SUMMARY  Visits from Start of Care: 25  Current functional level related to goals / functional outcomes: See  above for status.  Pt reports 95% overall improvement and no significant limitations at this time.     Remaining deficits: Pt with intermittent Rt LE numbness and Rt hip weakness.  HEP in place to address both.     Education / Equipment: HEP, Economist education Plan: Patient agrees to discharge.  Patient goals were met. Patient is being discharged due to meeting the stated rehab goals.  ?????         Sigurd Sos, PT 02/27/18 8:30 AM  [facility] [address] [place], [place], [address] Phone: [phone]   Fax:  [phone]  Name: Trevor Fischer MRN: [id number] Date of Birth: 03/11/1961

## 2018-03-06 ENCOUNTER — Other Ambulatory Visit: Payer: Self-pay | Admitting: Infectious Disease

## 2018-03-06 DIAGNOSIS — I10 Essential (primary) hypertension: Secondary | ICD-10-CM

## 2018-03-06 MED FILL — ACCU-CHEK FASTCLIX LANCETS: 25 days supply | Qty: 102 | Fill #1

## 2018-03-06 MED FILL — VICTOZA 2-PAK 18 MG/3 ML PE: 18 | 30 days supply | Qty: 6 | Fill #3

## 2018-03-08 ENCOUNTER — Ambulatory Visit: Payer: 59 | Admitting: Internal Medicine

## 2018-03-08 ENCOUNTER — Other Ambulatory Visit: Payer: Self-pay | Admitting: Infectious Disease

## 2018-03-08 ENCOUNTER — Encounter: Payer: Self-pay | Admitting: Internal Medicine

## 2018-03-08 ENCOUNTER — Other Ambulatory Visit: Payer: Self-pay

## 2018-03-08 VITALS — BP 142/73 | HR 60 | Temp 98.6°F | Ht 74.0 in | Wt 230.7 lb

## 2018-03-08 DIAGNOSIS — Z794 Long term (current) use of insulin: Secondary | ICD-10-CM | POA: Diagnosis not present

## 2018-03-08 DIAGNOSIS — Z79899 Other long term (current) drug therapy: Secondary | ICD-10-CM

## 2018-03-08 DIAGNOSIS — I10 Essential (primary) hypertension: Secondary | ICD-10-CM

## 2018-03-08 DIAGNOSIS — E1140 Type 2 diabetes mellitus with diabetic neuropathy, unspecified: Secondary | ICD-10-CM

## 2018-03-08 DIAGNOSIS — M5416 Radiculopathy, lumbar region: Secondary | ICD-10-CM | POA: Diagnosis not present

## 2018-03-08 DIAGNOSIS — D6859 Other primary thrombophilia: Secondary | ICD-10-CM | POA: Diagnosis not present

## 2018-03-08 LAB — POCT GLYCOSYLATED HEMOGLOBIN (HGB A1C): HEMOGLOBIN A1C: 7.2 % — AB (ref 4.0–5.6)

## 2018-03-08 LAB — GLUCOSE, CAPILLARY: GLUCOSE-CAPILLARY: 80 mg/dL (ref 65–99)

## 2018-03-08 MED ORDER — GABAPENTIN 800 MG PO TABS: 800.0000 mg | ORAL_TABLET | Freq: Three times a day (TID) | ORAL | 1 refills | Status: DC

## 2018-03-08 MED ORDER — APIXABAN 5 MG PO TABS: 5.0000 mg | ORAL_TABLET | Freq: Two times a day (BID) | ORAL | 1 refills | Status: DC

## 2018-03-08 MED ORDER — SILDENAFIL CITRATE 50 MG PO TABS: 50.0000 mg | ORAL_TABLET | Freq: Every day | ORAL | 2 refills | Status: DC | PRN

## 2018-03-08 MED ORDER — TRIAMTERENE-HCTZ 75-50 MG PO TABS: 0.5000 | ORAL_TABLET | Freq: Every day | ORAL | 1 refills | Status: DC

## 2018-03-08 MED FILL — GABAPENTIN 800 MG TABLET: 800 | 90 days supply | Qty: 270 | Fill #0

## 2018-03-08 MED FILL — SHINGRIX 50 MCG SUS: 50 | 1 days supply | Qty: 1 | Fill #0

## 2018-03-08 MED FILL — SILDENAFIL CITRATE 50 MG TA: 50 | 50 days supply | Qty: 10 | Fill #0

## 2018-03-08 MED FILL — TRIAMTERENE/HCTZ 75/50 TAB: 75-50 | 90 days supply | Qty: 45 | Fill #0

## 2018-03-08 NOTE — Patient Instructions (Signed)
I have reordered your current medicines. I increased your gabapentin prescription to 800mg  TID. Let me know if there are any problems with these refills or your pharmacy formulary coverage.  Your diabetes control is doing great and we do not need any changes today.

## 2018-03-08 NOTE — Progress Notes (Signed)
CC: Follow up for diabetes and right leg pain  HPI:  [name] is a 57 y.o. male with PMHx detailed below presenting for 3 month follow-up of his diabetes and also of his ongoing right leg pain. He has been working on reducing refined sweets in his diet and continuing with the same medications since 3 months ago. He has had a few episodes of symptomatic hypoglycemia while working in his yard after not eating about once monthly. His right leg continues to have intermittent radicular pain and occasional numbness and tingling in the right foot.  However his strength has improved back to normal with physical therapy and his pain is partially improved with epidural steroid injections.  He is no longer taking hydrocodone for pain relief and feels that gabapentin manages the symptoms adequately.  He continues to have chronic leg swelling and wears his compression socks diligently when at work.  See problem based assessment and plan below for additional details.  Diabetes mellitus with neuropathy (HCC) Hemoglobin A1c is 7.2% today which is getting close to his goal 7.0%.  He is having what sounds like symptomatic hypoglycemia about once per month on the current regimen after prolonged exertion and missing meals.  Overall this is progress compared to 3 months ago.  He met with [name] and was adding sliding scale insulin to his mealtime doses but has not been recently due to normal blood sugar measurements.  He seems to be tolerating the current medicines without difficulty.  He is up-to-date on screening with no new evidence of microvascular complications. Plan: Continue current regimen - Lantus 30 units qHS, Humalog 10 units TIDAC, Victoza 1.8 mg daily  Hypertension His blood pressure is slightly above goal today at 142/73 with a target of 130/80 due to his associated type 2 diabetes.  This is slightly higher than his last readings here. He could benefit from up titration of losartan  if this continues to be above goal at future visits, this may or may not obviate the need for triamterene Plan: Ordered refill triamterene hydrochlorothiazide 75-29m today 0.5 tabs daily Continue verapamil 240 mg, losartan 50 mg Check basic metabolic panel  [place]coagulable state ([place] He has a past history of provoked right lower extremity DVT after fibula fracture and also of unprovoked submassive pulmonary embolism in 05/2017.  He is now on lifelong anticoagulation and tolerating Eliquis without complication. Plan: Continue Eliquis 5 mg twice daily, reordered today  Lumbar radiculopathy, right His leg pain is improving no longer requiring opioid analgesics and the strength has improved to normal and symmetric with the left leg.  Request trying an increase of the gabapentin dose to 800 mg 3 times daily because he finds the 600 mg partially helpful for symptom relief.  I think this is a reasonable intervention to try and remains within the recommended total daily dose. Plan: Reordered gabapentin with increase from 6079mTID to 80071mID He will continue following up for ESI with his orthopedist   Past Medical History:  Diagnosis Date  . Allergic rhinitis   . Degenerative joint disease of knee   . DVT (deep venous thrombosis) (HCC) RLE X 2   Ended anticoagulation in 2012  . GERD (gastroesophageal reflux disease)   . 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 HIV infection ([place] . Hx MRSA infection   . Hyperlipidemia   . Hypertension   . [place]ism ([place]/13[place]  . Superficial thrombophlebitis   . Type II  diabetes mellitus ([place]) 9/09    Review of Systems: Review of Systems  Constitutional: Negative for malaise/fatigue.  Cardiovascular: Positive for leg swelling.  Musculoskeletal: Negative for falls.  Neurological: Positive for tingling. Negative for weakness.     Physical Exam: Vitals:   03/08/18 1359  BP: (!) 142/73  Pulse: 60  Temp: 98.6 F (37 C)    TempSrc: Oral  SpO2: 99%  Weight: 230 lb 11.2 oz (104.6 kg)  Height: '6\' 2"'  (1.88 m)   GENERAL- alert, co-operative, NAD HEENT- Atraumatic, oral mucosa appears moist, no cervical LN enlargement. CARDIAC- RRR, no murmurs, rubs or gallops. RESP- CTAB, no wheezes or crackles. BACK- Normal curvature, no paraspinal tenderness NEURO- Normal symmetric patellar and Achilles reflexes, 5 out of 5 strength bilaterally in knee extension, foot dorsiflexion and plantar flexion, sensation grossly intact light touch throughout EXTREMITIES-palpable DP pulses bilaterally, 1+ pedal edema in right leg SKIN- Warm, dry, No rash or lesion. PSYCH- Normal mood and affect, appropriate thought content and speech.   Assessment & Plan:   See encounters tab for problem based medical decision making.   Patient discussed with Dr. Evette Doffing

## 2018-03-09 LAB — BMP8+ANION GAP
ANION GAP: 15 mmol/L (ref 10.0–18.0)
BUN/Creatinine Ratio: 19 (ref 9–20)
BUN: 17 mg/dL (ref 6–24)
CO2: 24 mmol/L (ref 20–29)
Calcium: 9.4 mg/dL (ref 8.7–10.2)
Chloride: 100 mmol/L (ref 96–106)
Creatinine, Ser: 0.9 mg/dL (ref 0.76–1.27)
GFR calc Af Amer: 108 mL/min/{1.73_m2} (ref >59)
GFR calc non Af Amer: 94 mL/min/{1.73_m2} (ref >59)
GLUCOSE: 74 mg/dL (ref 65–99)
POTASSIUM: 4.4 mmol/L (ref 3.5–5.2)
SODIUM: 139 mmol/L (ref 134–144)

## 2018-03-09 MED FILL — ELIQUIS 5 MG TABLET: 5 | 30 days supply | Qty: 60 | Fill #0

## 2018-03-11 NOTE — Progress Notes (Signed)
Internal Medicine Clinic Attending  Case discussed with Dr. Rice soon after the resident saw the patient.  We reviewed the resident's history and exam and pertinent patient test results.  I agree with the assessment, diagnosis, and plan of care documented in the resident's note. 

## 2018-03-11 NOTE — Assessment & Plan Note (Signed)
His blood pressure is slightly above goal today at 142/73 with a target of 130/80 due to his associated type 2 diabetes.  This is slightly higher than his last readings here. He could benefit from up titration of losartan if this continues to be above goal at future visits, this may or may not obviate the need for triamterene Plan: Ordered refill triamterene hydrochlorothiazide 75-50mg  today 0.5 tabs daily Continue verapamil 240 mg, losartan 50 mg Check basic metabolic panel

## 2018-03-11 NOTE — Assessment & Plan Note (Addendum)
His leg pain is improving no longer requiring opioid analgesics and the strength has improved to normal and symmetric with the left leg.  Request trying an increase of the gabapentin dose to 800 mg 3 times daily because he finds the 600 mg partially helpful for symptom relief.  I think this is a reasonable intervention to try and remains within the recommended total daily dose. Plan: Reordered gabapentin with increase from 600mg  TID to 800mg  TID He will continue following up for [facility] with his orthopedist

## 2018-03-11 NOTE — Assessment & Plan Note (Signed)
He has a past history of provoked right lower extremity DVT after fibula fracture and also of unprovoked submassive pulmonary embolism in 05/2017.  He is now on lifelong anticoagulation and tolerating Eliquis without complication. Plan: Continue Eliquis 5 mg twice daily, reordered today

## 2018-03-11 NOTE — Assessment & Plan Note (Signed)
Hemoglobin A1c is 7.2% today which is getting close to his goal 7.0%.  He is having what sounds like symptomatic hypoglycemia about once per month on the current regimen after prolonged exertion and missing meals.  Overall this is progress compared to 3 months ago.  He met with [name] and was adding sliding scale insulin to his mealtime doses but has not been recently due to normal blood sugar measurements.  He seems to be tolerating the current medicines without difficulty.  He is up-to-date on screening with no new evidence of microvascular complications. Plan: Continue current regimen - Lantus 30 units qHS, Humalog 10 units TIDAC, Victoza 1.8 mg daily

## 2018-03-14 MED FILL — BACLOFEN 10 MG TABLET: 10 | 30 days supply | Qty: 90 | Fill #1

## 2018-03-18 MED FILL — BIKTARVY 50-200-25 MG TABS: 50-200-25 | 30 days supply | Qty: 30 | Fill #5

## 2018-03-26 MED FILL — ULTICARE ALCOHOL SWABS PADS: 25 days supply | Qty: 100 | Fill #1

## 2018-03-26 MED FILL — UNIFINE PENTIPS 32GX5/32: 32G X 4 MM | 33 days supply | Qty: 200 | Fill #1

## 2018-03-26 MED FILL — UNIFINE PENTIPS 32GX5/32": 32G X 4 MM | 33 days supply | Qty: 200 | Fill #1

## 2018-03-26 MED FILL — ACCU-CHEK GUIDE STRP: 25 days supply | Qty: 100 | Fill #4

## 2018-04-02 ENCOUNTER — Other Ambulatory Visit: Payer: Self-pay | Admitting: Internal Medicine

## 2018-04-02 DIAGNOSIS — Z794 Long term (current) use of insulin: Principal | ICD-10-CM

## 2018-04-02 DIAGNOSIS — E1140 Type 2 diabetes mellitus with diabetic neuropathy, unspecified: Secondary | ICD-10-CM

## 2018-04-02 MED FILL — ACCU-CHEK FASTCLIX LANCETS: 25 days supply | Qty: 102 | Fill #2

## 2018-04-02 MED FILL — HUMALOG 100 UNITS/ML KWIKPE: 100 | 38 days supply | Qty: 15 | Fill #0

## 2018-04-02 MED FILL — PANTOPRAZOLE SOD DR 40 MG T: 40 | 60 days supply | Qty: 60 | Fill #2

## 2018-04-02 MED FILL — VICTOZA 2-PAK 18 MG/3 ML PE: 18 | 30 days supply | Qty: 6 | Fill #4

## 2018-04-02 MED FILL — ATORVASTATIN 40 MG TABLET: 40 | 90 days supply | Qty: 90 | Fill #2

## 2018-04-16 ENCOUNTER — Other Ambulatory Visit: Payer: Self-pay | Admitting: Internal Medicine

## 2018-04-16 DIAGNOSIS — M5441 Lumbago with sciatica, right side: Principal | ICD-10-CM

## 2018-04-16 DIAGNOSIS — G8929 Other chronic pain: Secondary | ICD-10-CM

## 2018-04-16 MED FILL — BIKTARVY 50-200-25 MG TABS: 50-200-25 | 30 days supply | Qty: 30 | Fill #6

## 2018-04-16 MED FILL — BACLOFEN 10 MG TABLET: 10 | 30 days supply | Qty: 90 | Fill #0

## 2018-04-16 NOTE — Telephone Encounter (Signed)
This needs to be reviewed with new PCP.  Supposed to be a PRN medication, not really assessed in last year.    Thanks  Please have new appointment with PCP in next 1-2 months.

## 2018-04-22 MED FILL — ELIQUIS 5 MG TABLET: 5 | 30 days supply | Qty: 60 | Fill #1

## 2018-04-22 MED FILL — ACCU-CHEK GUIDE STRP: 25 days supply | Qty: 100 | Fill #5

## 2018-04-24 ENCOUNTER — Encounter: Payer: Self-pay | Admitting: *Deleted

## 2018-04-24 MED FILL — LANTUS SOLOSTAR 100 UNITS/M: 100 | 90 days supply | Qty: 21 | Fill #3

## 2018-04-30 MED FILL — ULTICARE ALCOHOL SWABS PADS: 25 days supply | Qty: 100 | Fill #2

## 2018-04-30 MED FILL — UNIFINE PENTIPS 32GX5/32": 32G X 4 MM | 33 days supply | Qty: 200 | Fill #2

## 2018-04-30 MED FILL — UNIFINE PENTIPS 32GX5/32: 32G X 4 MM | 33 days supply | Qty: 200 | Fill #2

## 2018-04-30 MED FILL — LOSARTAN POTASSIUM 50 MG TA: 50 | 90 days supply | Qty: 90 | Fill #1

## 2018-05-06 MED FILL — ACCU-CHEK FASTCLIX LANCETS: 25 days supply | Qty: 102 | Fill #3

## 2018-05-06 MED FILL — VICTOZA 2-PAK 18 MG/3 ML PE: 18 | 30 days supply | Qty: 6 | Fill #5

## 2018-05-14 MED FILL — SIMETHICONE 180 MG SOFTGEL: 180 | 80 days supply | Qty: 240 | Fill #2

## 2018-05-21 ENCOUNTER — Other Ambulatory Visit: Payer: Self-pay | Admitting: Internal Medicine

## 2018-05-21 ENCOUNTER — Other Ambulatory Visit: Payer: Self-pay | Admitting: Infectious Disease

## 2018-05-21 DIAGNOSIS — M5441 Lumbago with sciatica, right side: Principal | ICD-10-CM

## 2018-05-21 DIAGNOSIS — G8929 Other chronic pain: Secondary | ICD-10-CM

## 2018-05-21 MED FILL — HUMALOG 100 UNITS/ML KWIKPE: 100 | 38 days supply | Qty: 15 | Fill #1

## 2018-05-21 MED FILL — ELIQUIS 5 MG TABLET: 5 | 30 days supply | Qty: 60 | Fill #2

## 2018-05-21 MED FILL — BIKTARVY 50-200-25 MG TABS: 50-200-25 | 30 days supply | Qty: 30 | Fill #7

## 2018-05-21 MED FILL — PANTOPRAZOLE SOD DR 40 MG T: 40 | 60 days supply | Qty: 60 | Fill #0

## 2018-05-22 ENCOUNTER — Other Ambulatory Visit: Payer: Self-pay | Admitting: Internal Medicine

## 2018-05-22 NOTE — Telephone Encounter (Signed)
Pt is calling back, he talked to the pharmacy they are also denied his baclofen (LIORESAL) 10 MG tablet, can someone pls call patient and let him know what is going on, pt is upset [phone]

## 2018-05-22 NOTE — Telephone Encounter (Signed)
Needs refills on ULTICARE ALCOHOL SWABS 70 % PADS @ [facility]-asked pharmacy; said they was denied because it was inappropiate.  Pls call [phone]

## 2018-05-23 ENCOUNTER — Other Ambulatory Visit: Payer: Self-pay

## 2018-05-23 ENCOUNTER — Other Ambulatory Visit: Payer: Self-pay | Admitting: Internal Medicine

## 2018-05-23 ENCOUNTER — Ambulatory Visit (INDEPENDENT_AMBULATORY_CARE_PROVIDER_SITE_OTHER): Payer: 59 | Admitting: Internal Medicine

## 2018-05-23 ENCOUNTER — Encounter: Payer: Self-pay | Admitting: Internal Medicine

## 2018-05-23 VITALS — BP 124/70 | HR 56 | Temp 98.2°F | Ht 74.0 in | Wt 229.6 lb

## 2018-05-23 DIAGNOSIS — M5416 Radiculopathy, lumbar region: Secondary | ICD-10-CM | POA: Diagnosis not present

## 2018-05-23 DIAGNOSIS — Z0000 Encounter for general adult medical examination without abnormal findings: Secondary | ICD-10-CM

## 2018-05-23 DIAGNOSIS — Z79899 Other long term (current) drug therapy: Secondary | ICD-10-CM

## 2018-05-23 DIAGNOSIS — I10 Essential (primary) hypertension: Secondary | ICD-10-CM

## 2018-05-23 DIAGNOSIS — D6859 Other primary thrombophilia: Secondary | ICD-10-CM

## 2018-05-23 DIAGNOSIS — M545 Low back pain: Secondary | ICD-10-CM

## 2018-05-23 DIAGNOSIS — M5441 Lumbago with sciatica, right side: Principal | ICD-10-CM

## 2018-05-23 DIAGNOSIS — Z794 Long term (current) use of insulin: Secondary | ICD-10-CM

## 2018-05-23 DIAGNOSIS — M62830 Muscle spasm of back: Secondary | ICD-10-CM

## 2018-05-23 DIAGNOSIS — E1140 Type 2 diabetes mellitus with diabetic neuropathy, unspecified: Secondary | ICD-10-CM | POA: Diagnosis not present

## 2018-05-23 DIAGNOSIS — G8929 Other chronic pain: Secondary | ICD-10-CM

## 2018-05-23 MED ORDER — CYCLOBENZAPRINE HCL 5 MG PO TABS: 5.0000 mg | ORAL_TABLET | Freq: Three times a day (TID) | ORAL | 0 refills | Status: DC | PRN

## 2018-05-23 MED ORDER — GABAPENTIN 800 MG PO TABS: 800.0000 mg | ORAL_TABLET | Freq: Three times a day (TID) | ORAL | 1 refills | Status: DC

## 2018-05-23 MED ORDER — APIXABAN 5 MG PO TABS: 5.0000 mg | ORAL_TABLET | Freq: Two times a day (BID) | ORAL | 1 refills | Status: DC

## 2018-05-23 MED ORDER — TRIAMTERENE-HCTZ 75-50 MG PO TABS: 0.5000 | ORAL_TABLET | Freq: Every day | ORAL | 1 refills | Status: DC

## 2018-05-23 MED ORDER — ULTICARE ALCOHOL SWABS 70 % PADS: 300.0000 | MEDICATED_PAD | Freq: Three times a day (TID) | 2 refills | Status: DC

## 2018-05-23 MED ORDER — ATORVASTATIN CALCIUM 40 MG PO TABS: 40.0000 mg | ORAL_TABLET | Freq: Every day | ORAL | 3 refills | Status: DC

## 2018-05-23 MED ORDER — VERAPAMIL HCL ER 240 MG PO TBCR: 240.0000 mg | EXTENDED_RELEASE_TABLET | Freq: Every day | ORAL | 2 refills | Status: DC

## 2018-05-23 MED FILL — VERAPAMIL ER 240 MG TABLET: 240 | 90 days supply | Qty: 90 | Fill #0

## 2018-05-23 MED FILL — CYCLOBENZAPRINE 5 MG TABLET: 5 | 20 days supply | Qty: 60 | Fill #0

## 2018-05-23 MED FILL — ULTICARE ALCOHOL SWABS PADS: 90 days supply | Qty: 100 | Fill #0

## 2018-05-23 MED FILL — TRIAMTERENE/HCTZ 75/50 TAB: 75-50 | 90 days supply | Qty: 45 | Fill #0

## 2018-05-23 NOTE — Progress Notes (Addendum)
CC: Routine follow up   HPI:  [name] is a 57 y.o. with medical history as listed below presenting for follow-up of his chronic medical problems.  See problem based assessment below for further details.  Hypertension: His blood pressure has been fairly well controlled.  Today, BP at the clinic was 124/70 with pulse of 56.  Given his history of diabetes, I would prefer for his blood pressure to stay <130/80.  Currently his antihypertensive regimen are triamterene hydrochlorothiazide 75-50mg   daily, verapamil 240 mg daily and losartan 50 mg daily.  Plan: I am not making any adjustments to his antihypertensives and have given him refills today.   Type 2 diabetes mellitus: His last hemoglobin A1c from June was 7.2% which was down from 7.5%.  His CGM from 04/24/2018 to 05/23/2018 recorded an average BG of 123, highest BG of 230 and lowest BG of 61.  He reports of some symptomatic hypoglycemia about once a month especially when he forgets to eat and also he indicates there are times where he tries to "over medicate."  I counseled the patient of the dangers of overmedicating and also the importance of adhering to frequent, regular diet regimen.  Plan: -Continue Lantus 30 units qhs, Humalog 10 units TID w/ SS, Victoza 1.8 mg daily. -I have placed a future order for hemoglobin A1c in 1 month -Counseled extensively on appropriate medication adherence.  Lumbar radiculopathy: He has long-standing history of right-sided lower back pain with radiculopathic symptoms after injury at work.  He reports of experiencing shooting pain down his right leg about 3 times a month.  He also reports of associated muscle spasm.  Previously he has received epidural steroid injection from orthopedist and has reported significant improvement.  In addition his gabapentin dose was increased from 600 mg TID to 800 mg TID.  Patient was given Flexeril 5 mg 3 times daily as needed for muscle spasm, however he was  taking it on a scheduled dose and ran out of medication.  He reports of recurrence of muscle spasms especially at night after his Flexeril and out.  I believe he will benefit from further evaluation by orthopedist including but not limited to epidural steroid injection.   Plan: -Asked to make an appointment with Dr. Lynann Bologna -Continue gabapentin 800 mg TID -Ordered refill of Flexeril AS NEEDED for muscle spasm. (#60 tabs)  Health maintenance: We will discuss flu shot at the next visit.  Otherwise up-to-date    Past Medical History:  Diagnosis Date  . Allergic rhinitis   . Degenerative joint disease of knee   . DVT (deep venous thrombosis) (HCC) RLE X 2   Ended anticoagulation in 2012  . GERD (gastroesophageal reflux disease)   . 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 HIV infection ([place])   . Hx MRSA infection   . Hyperlipidemia   . Hypertension   . Pulmonary embolism ([place]) 05/14/2017  . Superficial thrombophlebitis   . Type II diabetes mellitus ([place]) 9/09   Review of Systems: As per HPI  Physical Exam:  Vitals:   05/23/18 1313  BP: 124/70  Pulse: (!) 56  Temp: 98.2 F (36.8 C)  TempSrc: Oral  SpO2: 100%  Weight: 229 lb 9.6 oz (104.1 kg)  Height: 6\' 2"  (1.88 m)   Constitutional: In no apparent distress, very pleasant gentleman Cardiovascular: Normal S1 and S2.  No murmurs, gallops, rubs Respiratory: Clear to auscultation bilaterally.  No wheezes, crackles, rhonchi Abdomen: Bowel sounds present, nontender  to palpation, nondistended Extremity: Dorsalis pedis palpable 2+ bilaterally Back: Lumbar spine and paraspinal non tender to palpation  Neuro: Sensation intact bilaterally at the lower extremity, Normal gait  Assessment & Plan:   See Encounters Tab for problem based charting.  Patient discussed with Dr. Rebeca Alert

## 2018-05-23 NOTE — Assessment & Plan Note (Signed)
Hypertension: His blood pressure has been fairly well controlled.  Today, BP at the clinic was 124/70 with pulse of 56.  Given his history of diabetes, I would prefer for his blood pressure to stay <130/80.  Currently his antihypertensive regimen are triamterene hydrochlorothiazide 75-50mg   daily, verapamil 240 mg daily and losartan 50 mg daily.  Plan: I am not making any adjustments to his antihypertensives and have given him refills today.

## 2018-05-23 NOTE — Assessment & Plan Note (Signed)
Health maintenance: We will discuss flu shot at the next visit.  Otherwise up-to-date

## 2018-05-23 NOTE — Assessment & Plan Note (Addendum)
Lumbar radiculopathy: He has long-standing history of right-sided lower back pain with radiculopathic symptoms after injury at work.  He reports of experiencing shooting pain down his right leg about 3 times a month.  He also reports of associated muscle spasm.  Previously he has received epidural steroid injection from orthopedist and has reported significant improvement.  In addition his gabapentin dose was increased from 600 mg TID to 800 mg TID.  Patient was given Flexeril 5 mg 3 times daily as needed for muscle spasm, however he was taking it on a scheduled dose and ran out of medication.  He reports of recurrence of muscle spasms especially at night after his Flexeril and out.  I believe he will benefit from further evaluation by orthopedist including but not limited to epidural steroid injection.   Plan: -Asked to make an appointment with Dr. Lynann Bologna -Continue gabapentin 800 mg TID -Ordered refill of Flexeril AS NEEDED for muscle spasm. (#60 tab

## 2018-05-23 NOTE — Assessment & Plan Note (Signed)
Type 2 diabetes mellitus: His last hemoglobin A1c from June was 7.2% which was down from 7.5%.  His CGM from 04/24/2018 to 05/23/2018 recorded an average BG of 123, highest BG of 230 and lowest BG of 61.  He reports of some symptomatic hypoglycemia about once a month especially when he forgets to eat and also he indicates there are times where he tries to "over medicate."  I counseled the patient of the dangers of overmedicating and also the importance of adhering to frequent, regular diet regimen.  Plan: -Continue Lantus 30 units qhs, Humalog 10 units TID w/ SS, Victoza 1.8 mg daily. -I have placed a future order for hemoglobin A1c in 1 month -Counseled extensively on appropriate medication adherence.

## 2018-05-23 NOTE — Patient Instructions (Signed)
Trevor Fischer,  It was a pleasure taking care of you today in the clinic and it looks like you are doing pretty well with your medical condition.  For your back pain, I would like to have you reevaluated by Dr. Lynann Bologna, Elta Guadeloupe for possible epidural steroid injection.  Please continue to take all your medications as prescribed.  ~Dr. Eileen Stanford

## 2018-05-24 NOTE — Telephone Encounter (Signed)
Patient completed OV with PCP on 05/23/2018. Hubbard Hartshorn, RN, BSN

## 2018-05-29 NOTE — Progress Notes (Signed)
Internal Medicine Clinic Attending  I saw and evaluated the patient.  I personally confirmed the key portions of the history and exam documented by Dr. Agyei and I reviewed pertinent patient test results.  The assessment, diagnosis, and plan were formulated together and I agree with the documentation in the resident's note.  Alexander Raines, M.D., Ph.D.  

## 2018-06-04 DIAGNOSIS — M545 Low back pain: Secondary | ICD-10-CM | POA: Diagnosis not present

## 2018-06-04 MED FILL — BACLOFEN 10 MG TABLET: 10 | 22 days supply | Qty: 90 | Fill #0

## 2018-06-04 MED FILL — ACCU-CHEK GUIDE STRP: 25 days supply | Qty: 100 | Fill #6

## 2018-06-04 MED FILL — VICTOZA 2-PAK 18 MG/3 ML PE: 18 | 30 days supply | Qty: 6 | Fill #6

## 2018-06-04 MED FILL — ACETAMINOPHEN/COD #3 TABLET: 300-30 | 5 days supply | Qty: 60 | Fill #0

## 2018-06-04 MED FILL — ACCU-CHEK FASTCLIX LANCETS: 25 days supply | Qty: 102 | Fill #4

## 2018-06-05 MED FILL — UNIFINE PENTIPS 32GX5/32": 32G X 4 MM | 33 days supply | Qty: 200 | Fill #3

## 2018-06-05 MED FILL — UNIFINE PENTIPS 32GX5/32: 32G X 4 MM | 33 days supply | Qty: 200 | Fill #3

## 2018-06-09 DIAGNOSIS — R600 Localized edema: Secondary | ICD-10-CM | POA: Diagnosis not present

## 2018-06-14 ENCOUNTER — Encounter: Payer: Self-pay | Admitting: Internal Medicine

## 2018-06-19 MED FILL — BIKTARVY 50-200-25 MG TABS: 50-200-25 | 30 days supply | Qty: 30 | Fill #8

## 2018-06-24 MED FILL — CYCLOBENZAPRINE 5 MG TABLET: 5 | 30 days supply | Qty: 60 | Fill #0

## 2018-06-24 MED FILL — ELIQUIS 5 MG TABLET: 5 | 30 days supply | Qty: 60 | Fill #3

## 2018-06-24 MED FILL — ATORVASTATIN 40 MG TABLET: 40 | 90 days supply | Qty: 90 | Fill #3

## 2018-07-01 ENCOUNTER — Other Ambulatory Visit: Payer: Self-pay

## 2018-07-01 ENCOUNTER — Other Ambulatory Visit (INDEPENDENT_AMBULATORY_CARE_PROVIDER_SITE_OTHER): Payer: 59

## 2018-07-01 ENCOUNTER — Other Ambulatory Visit: Payer: Self-pay | Admitting: Internal Medicine

## 2018-07-01 DIAGNOSIS — Z794 Long term (current) use of insulin: Secondary | ICD-10-CM | POA: Diagnosis not present

## 2018-07-01 DIAGNOSIS — E1140 Type 2 diabetes mellitus with diabetic neuropathy, unspecified: Secondary | ICD-10-CM

## 2018-07-01 MED FILL — SHINGRIX 50 MCG SUS: 50 | 1 days supply | Qty: 1 | Fill #1

## 2018-07-01 MED FILL — GABAPENTIN 800 MG TABLET: 800 | 90 days supply | Qty: 270 | Fill #0

## 2018-07-01 MED FILL — VICTOZA 2-PAK 18 MG/3 ML PE: 18 | 30 days supply | Qty: 6 | Fill #7

## 2018-07-01 MED FILL — SILDENAFIL CITRATE 50 MG TA: 50 | 50 days supply | Qty: 10 | Fill #1

## 2018-07-01 NOTE — Telephone Encounter (Signed)
Next appt scheduled 10/25 with PCP. 

## 2018-07-02 ENCOUNTER — Other Ambulatory Visit: Payer: Self-pay | Admitting: Internal Medicine

## 2018-07-02 LAB — HEMOGLOBIN A1C
Est. average glucose Bld gHb Est-mCnc: 177 mg/dL
Hgb A1c MFr Bld: 7.8 % — ABNORMAL HIGH (ref 4.8–5.6)

## 2018-07-05 ENCOUNTER — Other Ambulatory Visit: Payer: Self-pay

## 2018-07-08 MED FILL — LANTUS SOLOSTAR 100 UNITS/M: 100 | 90 days supply | Qty: 21 | Fill #4

## 2018-07-08 MED FILL — HUMALOG 100 UNITS/ML KWIKPE: 100 | 90 days supply | Qty: 36 | Fill #2

## 2018-07-09 MED FILL — ULTICARE ALCOHOL SWABS PADS: 25 days supply | Qty: 100 | Fill #1

## 2018-07-12 ENCOUNTER — Ambulatory Visit (INDEPENDENT_AMBULATORY_CARE_PROVIDER_SITE_OTHER): Payer: 59 | Admitting: Dietician

## 2018-07-12 ENCOUNTER — Other Ambulatory Visit: Payer: Self-pay

## 2018-07-12 ENCOUNTER — Encounter: Payer: Self-pay | Admitting: Dietician

## 2018-07-12 ENCOUNTER — Other Ambulatory Visit: Payer: Self-pay | Admitting: Dietician

## 2018-07-12 DIAGNOSIS — E1140 Type 2 diabetes mellitus with diabetic neuropathy, unspecified: Secondary | ICD-10-CM | POA: Diagnosis not present

## 2018-07-12 DIAGNOSIS — Z794 Long term (current) use of insulin: Principal | ICD-10-CM

## 2018-07-12 NOTE — Patient Instructions (Signed)
Please record the time, amount and what food drinks and activities you have while wearing the continuous glucose monitor(CGM).  Bring the folder with you to follow up appointments  Do not have a CT or an MRI while wearing the CGM.   Please make an appointment for 1 week with me and a doctor for the first of two CGM downloads..   You will also return in 2 weeks to have your second download and the CGM remove  [place] [phone] 

## 2018-07-12 NOTE — Progress Notes (Signed)
referral request for Continuous glucose monitoring

## 2018-07-12 NOTE — Progress Notes (Signed)
Documentation for Freestyle Libre Pro Continuous glucose monitoring Freestyle Libre Pro CGM sensor placed and started on [place] who was identified by name and date of birth.  Patient was educated about wearing sensor, keeping food, activity and medication log and when to call office.He was educated about how to care for the sensor and not to have an MRI, CT or Diathermy while wearing the sensor. Follow up was arranged with the patient for 1 week.   Lot #:[id number] Serial #:[id number] Expiration Date:01/30/2019 [name], RD 07/12/2018 1:53 PM.

## 2018-07-17 MED FILL — UNIFINE PENTIPS 32GX5/32": 32G X 4 MM | 33 days supply | Qty: 200 | Fill #4

## 2018-07-17 MED FILL — BACLOFEN 10 MG TABLET: 10 | 22 days supply | Qty: 90 | Fill #1

## 2018-07-17 MED FILL — BIKTARVY 50-200-25 MG TABS: 50-200-25 | 30 days supply | Qty: 30 | Fill #9

## 2018-07-17 MED FILL — UNIFINE PENTIPS 32GX5/32: 32G X 4 MM | 33 days supply | Qty: 200 | Fill #4

## 2018-07-18 MED FILL — CYCLOBENZAPRINE 5 MG TABLET: 5 | 30 days supply | Qty: 60 | Fill #1

## 2018-07-18 MED FILL — ACCU-CHEK GUIDE STRP: 25 days supply | Qty: 100 | Fill #7

## 2018-07-19 ENCOUNTER — Ambulatory Visit (INDEPENDENT_AMBULATORY_CARE_PROVIDER_SITE_OTHER): Payer: 59 | Admitting: Dietician

## 2018-07-19 ENCOUNTER — Encounter: Payer: Self-pay | Admitting: Dietician

## 2018-07-19 DIAGNOSIS — Z6829 Body mass index (BMI) 29.0-29.9, adult: Secondary | ICD-10-CM | POA: Diagnosis not present

## 2018-07-19 DIAGNOSIS — Z794 Long term (current) use of insulin: Secondary | ICD-10-CM

## 2018-07-19 DIAGNOSIS — Z713 Dietary counseling and surveillance: Secondary | ICD-10-CM

## 2018-07-19 DIAGNOSIS — E1140 Type 2 diabetes mellitus with diabetic neuropathy, unspecified: Secondary | ICD-10-CM | POA: Diagnosis not present

## 2018-07-19 NOTE — Progress Notes (Signed)
  Medical Nutrition Therapy:  Appt start time: 1430 end time:  2992. Visit # 1  Assessment:  Primary concerns today: glycemic control.  Trevor Fischer presents for CGM download #1. He is accompanied by his spouse. He kept very good records of food, activity, insulin taken and glucose values. His CGM download is concerning for 19% hypoglycemia and large variability. His Bolus insulin is 40 units, which is 10 units more than his basal insulin. His low blood sugars occur throughout the day and seem to be due to insulin stacking and too many units for snacks. He otherwise is doing a great job of  keeping his carb content of his meals to 60-90 grams.  He takes his insulin right before meals which also may be contributing to insulin stacking. He does report symptoms of low blood sugar about half the time, but some he was unaware of.   Preferred Learning Style: No preference indicated  Learning Readiness: Ready  ANTHROPOMETRICS: Estimated body mass index is 29.48 kg/m as calculated from the following:   Height as of 05/23/18: 6\' 2"  (1.88 m).   Weight as of 05/23/18: 229 lb 9.6 oz (104.1 kg).  WEIGHT HISTORY:  Wt Readings from Last 5 Encounters:  05/23/18 229 lb 9.6 oz (104.1 kg)  03/08/18 230 lb 11.2 oz (104.6 kg)  12/07/17 227 lb 11.2 oz (103.3 kg)  11/10/17 225 lb (102.1 kg)  10/04/17 227 lb (103 kg)   SLEEP:works shifts, 8 hours some days, 12 hours others. Suspect he does not get adequate rest.   MEDICATIONS: lantus 8 AM,  BLOOD SUGAR: Hansel Starling wore the CGM for 8 days.  average reading was 134  % time in target was 55  % time below target was 19 (target, 3%)   % time above target was. 26 CV: 53.7( target < 36) Standard deviation 72 ( target, 50)  DIETARY INTAKE: Usual eating pattern includes 3 meals and 1 snacks per day. Everyday foods include french today sticks, calzone, loaded baked potato, lactose free milk, [place] sandwich, chips.   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   Nutritional Diagnosis:  NB-1.4 Self-monitoring deficit As related to not having access to continuous glucose monitor.  As evidenced by  his report and not having and CGM.    Intervention:  Nutrition education about Continuous glucose monitoring report and what might be causing his hypoglycemia.Also reviewed lower glycemic load foods and options for faster acting meal tine insulin.  Coordination of care: suggest he consider insulin pump or VGo and CGM with alarms  Teaching Method Utilized: Visual,  Auditory, Hands on Handouts given during visit include:avs, cgm reprot Barriers to learning/adherence to lifestyle change: competing values/lack of sleep Demonstrated degree of understanding via:  Teach Back   Monitoring/Evaluation:  Dietary intake, exercise, meter, and body weight in 1 week(s). [name], RD 07/19/2018 4:22 PM.

## 2018-07-19 NOTE — Patient Instructions (Addendum)
Plan to decrease low blood sugar:  1- take only 5 units Humalog/Fiasp at 2 AM if you eat a snack and your blood sugar is above 150  2- Replace Humalog with FIASP  And mark on records what meals/days you do this for. It is really fast so take with your meal - no dilly dallying.   3- maybe try less processed foods, more fiber, add veggies, add chia seeds, flax seeds, greek yogurt, rye or sourdough bread.

## 2018-07-23 MED FILL — ACCU-CHEK FASTCLIX LANCETS: 25 days supply | Qty: 102 | Fill #5

## 2018-07-26 ENCOUNTER — Other Ambulatory Visit: Payer: Self-pay

## 2018-07-26 ENCOUNTER — Ambulatory Visit (INDEPENDENT_AMBULATORY_CARE_PROVIDER_SITE_OTHER): Payer: 59 | Admitting: Internal Medicine

## 2018-07-26 ENCOUNTER — Telehealth: Payer: Self-pay | Admitting: Dietician

## 2018-07-26 ENCOUNTER — Ambulatory Visit (INDEPENDENT_AMBULATORY_CARE_PROVIDER_SITE_OTHER): Payer: 59 | Admitting: Dietician

## 2018-07-26 ENCOUNTER — Encounter: Payer: Self-pay | Admitting: Internal Medicine

## 2018-07-26 DIAGNOSIS — Z794 Long term (current) use of insulin: Secondary | ICD-10-CM

## 2018-07-26 DIAGNOSIS — Z6829 Body mass index (BMI) 29.0-29.9, adult: Secondary | ICD-10-CM | POA: Diagnosis not present

## 2018-07-26 DIAGNOSIS — Z21 Asymptomatic human immunodeficiency virus [HIV] infection status: Secondary | ICD-10-CM

## 2018-07-26 DIAGNOSIS — I10 Essential (primary) hypertension: Secondary | ICD-10-CM

## 2018-07-26 DIAGNOSIS — E1140 Type 2 diabetes mellitus with diabetic neuropathy, unspecified: Secondary | ICD-10-CM

## 2018-07-26 DIAGNOSIS — Z79899 Other long term (current) drug therapy: Secondary | ICD-10-CM

## 2018-07-26 DIAGNOSIS — Z713 Dietary counseling and surveillance: Secondary | ICD-10-CM | POA: Diagnosis not present

## 2018-07-26 LAB — GLUCOSE, CAPILLARY: Glucose-Capillary: 93 mg/dL (ref 70–99)

## 2018-07-26 MED ORDER — ACCU-CHEK FASTCLIX LANCETS MISC: 99 refills | Status: DC

## 2018-07-26 MED ORDER — ULTICARE ALCOHOL SWABS 70 % PADS: 300.0000 | MEDICATED_PAD | Freq: Three times a day (TID) | 3 refills | Status: DC

## 2018-07-26 MED ORDER — GLUCOSE BLOOD VI STRP: ORAL_STRIP | 99 refills | Status: DC

## 2018-07-26 NOTE — Telephone Encounter (Signed)
Corrected directions on testing supplies

## 2018-07-26 NOTE — Assessment & Plan Note (Signed)
Hypertension: This issue is chronic and well-controlled.  BP Readings from Last 3 Encounters:  07/26/18 120/74  05/23/18 124/70  03/08/18 (!) 142/73   Plan: -Continue current medication with triamterene-HCTZ 75-50 mg daily, verapamil 240 mg daily, losartan 50 mg daily.

## 2018-07-26 NOTE — Assessment & Plan Note (Signed)
Type 2 diabetes mellitus: A1c still not at goal.  A1c on 9/30 was 7.8% from 7.2%.  I have been working closely with [name] to optimize patient's diabetes medication regimen.  He last saw [name] on 07/19/2018 for which she was found to have 19% hypoglycemic episodes with a large variability.  He was instructed to continue Lantus 30 units daily and start FIASP (fast acting insulin aspart) 10 units 3 times a day.   Today, his CGM reads are as follows.  Average blood glucose of 130, 24% above average, 54% in target range, 22% of hypoglycemic episodes below 70.  Patient reports he does not experience symptoms of hypoglycemia.  Per review of his CGM log, patient seems to be having hypoglycemic episodes usually in the early mornings at 6 AM and around 6 PM.  This is in part due to patient's erratic work schedule as well as irregular eating patterns.  Plan: -Decrease Lantus from 30 units daily to 25 units daily. -Discontinue FIASP  - Resume back on Humalog 10 units qAM, 5 units at lunch, 5 units at dinner and 5 units (midnight snack) -Patient advised to not take short acting insulin if his pre-meal blood sugar is below 120. - Continue Victoza 1.8 mg daily.

## 2018-07-26 NOTE — Patient Instructions (Signed)
Hi Trevor Fischer  It was a pleasure taking care of your the clinic today.  As we discussed with [name] and myself him recommendations after today's visit.  1.  Decrease your Lantus from 30 units to 25 units daily. 2.  Discontinue FIASP 3.  Start back on the Humalog 10 units in the morning, 5 units for lunch 5 units for dinner and 5 units for your nighttime snack.  Per our discussion, please do not take the short acting insulin if you do not eat.  Take care Dr. Eileen Stanford

## 2018-07-26 NOTE — Progress Notes (Signed)
Medical Nutrition Therapy:  Appt start time: 1320 end time:  0349. Visit # 2  Assessment:  Primary concerns today: glycemic control.  Trevor Fischer presents for CGM download #2. He is accompanied by his spouse. He kept very good records of food, activity, insulin taken and glucose values. His CGM download shows  Average 130  Time in range(70-180) 54% Time above range: 24% Time below range: 22% Coefficient of variation: 52.7%  Standard deviation: 68.5  ANTHROPOMETRICS: Estimated body mass index is 29.79 kg/m as calculated from the following:   Height as of an earlier encounter on 07/26/18: 6\' 2"  (1.88 m).   Weight as of an earlier encounter on 07/26/18: 232 lb (105.2 kg).  SLEEP:works shifts, 8 hours some days, 12 hours others. Suspect he does not get adequate rest.   MEDICATIONS: took FIASP 10 units often 4 times a day, did not see much difference while using it BLOOD SUGAR:Improved post prandial slightly but more lows  DIETARY INTAKE: Usual eating pattern includes 3 meals and 1 snacks per day. 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   Nutritional Diagnosis:  NB-1.4 Self-monitoring deficit As related to not having access to continuous glucose monitor.  As evidenced by  his report and not having and CGM.    Intervention:  Nutrition education about Continuous glucose monitoring report and what might be causing his hypoglycemia/hyperglycemia. Encouraged personal CGM to help prevent lows and  lower glycemic load foods. Coordination of care: suggest he consider insulin pump, VGo, CGM with alarms  Teaching Method Utilized: Visual,  Auditory, Hands on Handouts given during visit include:avs, cgm reprot Barriers to learning/adherence to lifestyle change: competing values/lack of sleep Demonstrated  degree of understanding via:  Teach Back   Monitoring/Evaluation:  Dietary intake, exercise, meter, and body weight in 3-6 month(s). [name], RD 07/26/2018 3:13 PM.

## 2018-07-26 NOTE — Progress Notes (Signed)
Medicine attending: I personally interviewed and briefly examined this patient on the day of the patient visit and reviewed pertinent clinical ,laboratory, and radiographic data  with resident physician Dr. Nathanial Rancher and we discussed a management plan. Some minor adjustments will be made to his insulin regimen.

## 2018-07-26 NOTE — Progress Notes (Signed)
   CC: Follow-up diabetes mellitus  HPI:  [name] is a 57 y.o. pleasant gentleman with medical history significant for hypertension, type 2 diabetes mellitus, HIV presented for follow-up.  Please see problem based charting for details.    Past Medical History:  Diagnosis Date  . Allergic rhinitis   . Degenerative joint disease of knee   . DVT (deep venous thrombosis) (HCC) RLE X 2   Ended anticoagulation in 2012  . GERD (gastroesophageal reflux disease)   . 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 HIV infection ([place])   . Hx MRSA infection   . Hyperlipidemia   . Hypertension   . Pulmonary embolism ([place]) 05/14/2017  . Superficial thrombophlebitis   . Type II diabetes mellitus ([place]) 9/09   Review of Systems: As per HPI  Physical Exam:  Vitals:   07/26/18 1322  BP: 120/74  Pulse: 73  Temp: 98 F (36.7 C)  TempSrc: Oral  SpO2: 99%  Weight: 232 lb (105.2 kg)  Height: 6\' 2"  (1.88 m)   Physical Exam  Constitutional: He is well-developed, well-nourished, and in no distress. No distress.  HENT:  Head: Normocephalic.  Neck: Neck supple.  Cardiovascular: Normal rate, regular rhythm and normal heart sounds. Exam reveals no gallop and no friction rub.  No murmur heard. Pulmonary/Chest: Breath sounds normal. No respiratory distress. He has no rales.  Abdominal: Soft. Bowel sounds are normal. He exhibits no distension. There is no tenderness. There is no rebound.  Musculoskeletal: He exhibits no edema.  Neurological: He is alert.  Skin: Skin is warm. He is not diaphoretic.  Psychiatric: Mood, memory, affect and judgment normal.    Assessment & Plan:   See Encounters Tab for problem based charting.  Patient discussed with Dr. Beryle Beams

## 2018-07-26 NOTE — Patient Instructions (Addendum)
Please try to include more vegetables, nuts and seeds in your meals ALSO- sometimes if we eat these before the other food on our plate, the blood sugar does not rise as much after we eat.   Examples of veggies easy to prepare- Sliced cucumber, carrots with Humus or ranch dressing Brussel sprouts in olive oil and garlic Stir fried vegetables  [employer]- peanuts- $1.59/large container- unsalted dry roasted, sunflower seeds add to salads, almonds are good also to add to salads and oatmeal   POur out the bag salads- there is a kale salad, a southwestern salad at [employer] Old fashioned oats at [employer]  Their ready to eat spring mix is great also- throw anything you want on it- add half a four cheese Calzone and you are good to go!  Sugest follow up 3-6 months. As always, call anytime!  [name] [phone]

## 2018-07-29 MED FILL — ELIQUIS 5 MG TABLET: 5 | 30 days supply | Qty: 60 | Fill #4

## 2018-07-31 MED FILL — SIMETHICONE 180 MG CAPS: 180 | 80 days supply | Qty: 240 | Fill #3

## 2018-08-13 MED FILL — ULTICARE ALCOHOL SWABS PADS: 25 days supply | Qty: 100 | Fill #2

## 2018-08-13 MED FILL — LOSARTAN POTASSIUM 50 MG TA: 50 | 90 days supply | Qty: 90 | Fill #2

## 2018-08-13 MED FILL — CYCLOBENZAPRINE 5 MG TABLET: 5 | 30 days supply | Qty: 60 | Fill #0

## 2018-08-13 MED FILL — ACCU-CHEK GUIDE STRP: 25 days supply | Qty: 100 | Fill #8

## 2018-08-13 MED FILL — VICTOZA 2-PAK 18 MG/3 ML PE: 18 | 30 days supply | Qty: 6 | Fill #8

## 2018-08-13 MED FILL — PANTOPRAZOLE SOD DR 40 MG T: 40 | 60 days supply | Qty: 60 | Fill #1

## 2018-08-21 MED FILL — BIKTARVY 50-200-25 MG TABS: 50-200-25 | 30 days supply | Qty: 30 | Fill #10

## 2018-08-23 MED FILL — ACETAMINOPHEN/COD #3 TABLET: 300-30 | 5 days supply | Qty: 60 | Fill #0

## 2018-08-23 MED FILL — BACLOFEN 10 MG TABLET: 10 | 22 days supply | Qty: 90 | Fill #0

## 2018-08-27 MED FILL — UNIFINE PENTIPS 32GX5/32": 32G X 4 MM | 33 days supply | Qty: 200 | Fill #5

## 2018-08-27 MED FILL — ACCU-CHEK FASTCLIX LANCETS: 25 days supply | Qty: 102 | Fill #6

## 2018-08-27 MED FILL — ELIQUIS 5 MG TABLET: 5 | 30 days supply | Qty: 60 | Fill #5

## 2018-08-27 MED FILL — UNIFINE PENTIPS 32GX5/32: 32G X 4 MM | 33 days supply | Qty: 200 | Fill #5

## 2018-09-05 MED FILL — VERAPAMIL ER 240 MG TABLET: 240 | 90 days supply | Qty: 90 | Fill #1

## 2018-09-09 DIAGNOSIS — E119 Type 2 diabetes mellitus without complications: Secondary | ICD-10-CM | POA: Diagnosis not present

## 2018-09-09 DIAGNOSIS — H2513 Age-related nuclear cataract, bilateral: Secondary | ICD-10-CM | POA: Diagnosis not present

## 2018-09-12 MED FILL — CYCLOBENZAPRINE 5 MG TABLET: 5 | 30 days supply | Qty: 60 | Fill #1

## 2018-09-12 MED FILL — VICTOZA 2-PAK 18 MG/3 ML PE: 18 | 30 days supply | Qty: 6 | Fill #9

## 2018-09-13 MED FILL — ULTICARE ALCOHOL SWABS PADS: 90 days supply | Qty: 1500 | Fill #0

## 2018-09-17 MED FILL — BIKTARVY 50-200-25 MG TABS: 50-200-25 | 30 days supply | Qty: 30 | Fill #11

## 2018-09-17 MED FILL — TRIAMTERENE/HCTZ 75/50 TAB: 75-50 | 90 days supply | Qty: 45 | Fill #1

## 2018-09-18 ENCOUNTER — Other Ambulatory Visit: Payer: Self-pay | Admitting: Internal Medicine

## 2018-09-23 ENCOUNTER — Other Ambulatory Visit: Payer: Self-pay | Admitting: Internal Medicine

## 2018-09-23 MED FILL — ACCU-CHEK GUIDE STRP: 25 days supply | Qty: 100 | Fill #9

## 2018-09-27 MED FILL — LANTUS SOLOSTAR 100 UNITS/M: 100 | 90 days supply | Qty: 21 | Fill #0

## 2018-09-30 MED FILL — SIMETHICONE 180 MG CAPS: 180 | 30 days supply | Qty: 90 | Fill #4

## 2018-09-30 MED FILL — ELIQUIS 5 MG TABLET: 5 | 90 days supply | Qty: 180 | Fill #0

## 2018-09-30 MED FILL — PANTOPRAZOLE SOD DR 40 MG T: 40 | 90 days supply | Qty: 90 | Fill #2

## 2018-09-30 MED FILL — GABAPENTIN 800 MG TABLET: 800 | 90 days supply | Qty: 270 | Fill #1

## 2018-09-30 MED FILL — ATORVASTATIN 40 MG TABLET: 40 | 90 days supply | Qty: 90 | Fill #0

## 2018-10-03 MED FILL — UNIFINE PENTIPS 32GX5/32": 32G X 4 MM | 33 days supply | Qty: 200 | Fill #6

## 2018-10-03 MED FILL — ACCU-CHEK FASTCLIX LANCETS: 25 days supply | Qty: 102 | Fill #7

## 2018-10-03 MED FILL — UNIFINE PENTIPS 32GX5/32: 32G X 4 MM | 33 days supply | Qty: 200 | Fill #6

## 2018-10-07 MED FILL — CYCLOBENZAPRINE 5 MG TABLET: 5 | 30 days supply | Qty: 60 | Fill #0

## 2018-10-07 MED FILL — BACLOFEN 10 MG TABLET: 10 | 22 days supply | Qty: 90 | Fill #1

## 2018-10-07 MED FILL — VICTOZA 2-PAK 18 MG/3 ML PE: 18 | 30 days supply | Qty: 6 | Fill #10

## 2018-10-08 ENCOUNTER — Other Ambulatory Visit: Payer: Self-pay | Admitting: Behavioral Health

## 2018-10-08 DIAGNOSIS — B20 Human immunodeficiency virus [HIV] disease: Secondary | ICD-10-CM

## 2018-10-08 MED ORDER — BICTEGRAVIR-EMTRICITAB-TENOFOV 50-200-25 MG PO TABS: 1.0000 | ORAL_TABLET | Freq: Every day | ORAL | 0 refills | Status: DC

## 2018-10-09 MED FILL — BIKTARVY 50-200-25 MG TABS: 50-200-25 | 30 days supply | Qty: 30 | Fill #0

## 2018-10-15 ENCOUNTER — Other Ambulatory Visit (HOSPITAL_COMMUNITY)
Admission: RE | Admit: 2018-10-15 | Discharge: 2018-10-15 | Disposition: A | Discharge status: Home / Self Care | Payer: 59 | Source: Ambulatory Visit | Attending: Infectious Disease | Admitting: Infectious Disease

## 2018-10-15 ENCOUNTER — Other Ambulatory Visit: Payer: 59

## 2018-10-15 DIAGNOSIS — M5441 Lumbago with sciatica, right side: Secondary | ICD-10-CM | POA: Diagnosis not present

## 2018-10-15 DIAGNOSIS — G8929 Other chronic pain: Secondary | ICD-10-CM | POA: Insufficient documentation

## 2018-10-15 DIAGNOSIS — B20 Human immunodeficiency virus [HIV] disease: Secondary | ICD-10-CM | POA: Diagnosis not present

## 2018-10-15 DIAGNOSIS — Z113 Encounter for screening for infections with a predominantly sexual mode of transmission: Secondary | ICD-10-CM | POA: Insufficient documentation

## 2018-10-16 LAB — T-HELPER CELL (CD4) - (RCID CLINIC ONLY)
CD4 % Helper T Cell: 29 % — ABNORMAL LOW (ref 33–55)
CD4 T Cell Abs: 380 /uL — ABNORMAL LOW (ref 400–2700)

## 2018-10-16 LAB — URINE CYTOLOGY ANCILLARY ONLY
Chlamydia: NEGATIVE
Neisseria Gonorrhea: NEGATIVE

## 2018-10-17 LAB — CBC WITH DIFFERENTIAL/PLATELET
Absolute Monocytes: 547 cells/uL (ref 200–950)
Basophils Absolute: 22 cells/uL (ref 0–200)
Basophils Relative: 0.3 %
Eosinophils Absolute: 144 cells/uL (ref 15–500)
Eosinophils Relative: 2 %
HCT: 42.3 % (ref 38.5–50.0)
Hemoglobin: 14.1 g/dL (ref 13.2–17.1)
Lymphs Abs: 1397 cells/uL (ref 850–3900)
MCH: 27.1 pg (ref 27.0–33.0)
MCHC: 33.3 g/dL (ref 32.0–36.0)
MCV: 81.2 fL (ref 80.0–100.0)
MPV: 9.6 fL (ref 7.5–12.5)
Monocytes Relative: 7.6 %
Neutro Abs: 5090 cells/uL (ref 1500–7800)
Neutrophils Relative %: 70.7 %
PLATELETS: 239 10*3/uL (ref 140–400)
RBC: 5.21 10*6/uL (ref 4.20–5.80)
RDW: 12.5 % (ref 11.0–15.0)
TOTAL LYMPHOCYTE: 19.4 %
WBC: 7.2 10*3/uL (ref 3.8–10.8)

## 2018-10-17 LAB — COMPLETE METABOLIC PANEL WITH GFR
AG RATIO: 1.5 (calc) (ref 1.0–2.5)
ALT: 51 U/L — ABNORMAL HIGH (ref 9–46)
AST: 38 U/L — ABNORMAL HIGH (ref 10–35)
Albumin: 4.3 g/dL (ref 3.6–5.1)
Alkaline phosphatase (APISO): 113 U/L (ref 40–115)
BUN/Creatinine Ratio: 24 (calc) — ABNORMAL HIGH (ref 6–22)
BUN: 28 mg/dL — ABNORMAL HIGH (ref 7–25)
CO2: 28 mmol/L (ref 20–32)
Calcium: 8.9 mg/dL (ref 8.6–10.3)
Chloride: 100 mmol/L (ref 98–110)
Creat: 1.19 mg/dL (ref 0.70–1.33)
GFR, Est African American: 77 mL/min/{1.73_m2} (ref >=60)
GFR, Est Non African American: 66 mL/min/{1.73_m2} (ref >=60)
Globulin: 2.8 g/dL (calc) (ref 1.9–3.7)
Glucose, Bld: 235 mg/dL — ABNORMAL HIGH (ref 65–99)
Potassium: 4.8 mmol/L (ref 3.5–5.3)
Sodium: 135 mmol/L (ref 135–146)
Total Bilirubin: 0.7 mg/dL (ref 0.2–1.2)
Total Protein: 7.1 g/dL (ref 6.1–8.1)

## 2018-10-17 LAB — HIV-1 RNA QUANT-NO REFLEX-BLD
HIV 1 RNA Quant: <20 copies/mL
HIV-1 RNA Quant, Log: <1.3 Log copies/mL

## 2018-10-17 LAB — RPR: RPR: NONREACTIVE

## 2018-10-18 MED FILL — HUMALOG 100 UNITS/ML KWIKPE: 100 | 60 days supply | Qty: 24 | Fill #3

## 2018-10-23 MED FILL — ACCU-CHEK GUIDE STRP: 25 days supply | Qty: 100 | Fill #10

## 2018-11-01 ENCOUNTER — Encounter: Payer: Self-pay | Admitting: Infectious Disease

## 2018-11-01 ENCOUNTER — Ambulatory Visit: Payer: 59 | Admitting: Infectious Disease

## 2018-11-01 VITALS — BP 146/80 | HR 81 | Temp 98.4°F | Wt 236.0 lb

## 2018-11-01 DIAGNOSIS — I10 Essential (primary) hypertension: Secondary | ICD-10-CM

## 2018-11-01 DIAGNOSIS — Z794 Long term (current) use of insulin: Secondary | ICD-10-CM | POA: Diagnosis not present

## 2018-11-01 DIAGNOSIS — E1140 Type 2 diabetes mellitus with diabetic neuropathy, unspecified: Secondary | ICD-10-CM

## 2018-11-01 DIAGNOSIS — B20 Human immunodeficiency virus [HIV] disease: Secondary | ICD-10-CM | POA: Diagnosis not present

## 2018-11-01 DIAGNOSIS — Z23 Encounter for immunization: Secondary | ICD-10-CM

## 2018-11-01 MED ORDER — BICTEGRAVIR-EMTRICITAB-TENOFOV 50-200-25 MG PO TABS: 1.0000 | ORAL_TABLET | Freq: Every day | ORAL | 11 refills | Status: DC

## 2018-11-01 MED FILL — BIKTARVY 50-200-25 MG TABS: 50-200-25 | 30 days supply | Qty: 30 | Fill #0

## 2018-11-01 NOTE — Addendum Note (Signed)
Addended by: Lenore Cordia on: 11/01/2018 12:20 PM   Modules accepted: Orders
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
## 2018-11-04 ENCOUNTER — Other Ambulatory Visit: Payer: Self-pay | Admitting: Internal Medicine

## 2018-11-04 DIAGNOSIS — R143 Flatulence: Secondary | ICD-10-CM

## 2018-11-04 MED FILL — VICTOZA 2-PAK 18 MG/3 ML PE: 18 | 30 days supply | Qty: 6 | Fill #11

## 2018-11-06 ENCOUNTER — Other Ambulatory Visit: Payer: Self-pay | Admitting: Internal Medicine

## 2018-11-06 DIAGNOSIS — R143 Flatulence: Secondary | ICD-10-CM

## 2018-11-06 MED FILL — ACCU-CHEK FASTCLIX LANCETS: 25 days supply | Qty: 102 | Fill #8

## 2018-11-06 MED FILL — UNIFINE PENTIPS 32GX5/32: 32G X 4 MM | 33 days supply | Qty: 200 | Fill #7

## 2018-11-06 MED FILL — UNIFINE PENTIPS 32GX5/32": 32G X 4 MM | 33 days supply | Qty: 200 | Fill #7

## 2018-11-07 MED FILL — LOSARTAN POTASSIUM 50 MG TA: 50 | 90 days supply | Qty: 90 | Fill #3

## 2018-11-08 ENCOUNTER — Other Ambulatory Visit: Payer: Self-pay | Admitting: Internal Medicine

## 2018-11-08 ENCOUNTER — Other Ambulatory Visit: Payer: Self-pay | Admitting: *Deleted

## 2018-11-08 DIAGNOSIS — M5441 Lumbago with sciatica, right side: Principal | ICD-10-CM

## 2018-11-08 DIAGNOSIS — R143 Flatulence: Secondary | ICD-10-CM

## 2018-11-08 DIAGNOSIS — G8929 Other chronic pain: Secondary | ICD-10-CM

## 2018-11-08 MED ORDER — BACLOFEN 10 MG PO TABS: 10.0000 mg | ORAL_TABLET | Freq: Three times a day (TID) | ORAL | 0 refills | Status: DC

## 2018-11-08 MED FILL — SIMETHICONE 180 MG CAPS: 180 | 90 days supply | Qty: 270 | Fill #0

## 2018-11-08 MED FILL — CYCLOBENZAPRINE 5 MG TABLET: 5 | 30 days supply | Qty: 60 | Fill #1

## 2018-11-11 MED FILL — BACLOFEN 10 MG TABLET: 10 | 30 days supply | Qty: 90 | Fill #0 | Status: TO

## 2018-11-25 MED FILL — ACCU-CHEK GUIDE STRP: 90 days supply | Qty: 400 | Fill #0

## 2018-11-26 MED FILL — ACCU-CHEK FASTCLIX LANCETS: 25 days supply | Qty: 102 | Fill #9

## 2018-11-27 ENCOUNTER — Other Ambulatory Visit: Payer: Self-pay | Admitting: Internal Medicine

## 2018-11-27 DIAGNOSIS — I10 Essential (primary) hypertension: Secondary | ICD-10-CM

## 2018-11-27 MED FILL — VERAPAMIL ER 240 MG TABLET: 240 | 90 days supply | Qty: 90 | Fill #2 | Status: TO

## 2018-11-28 MED FILL — TRIAMTERENE/HCTZ 75/50 TAB: 75-50 | 90 days supply | Qty: 45 | Fill #1

## 2018-12-05 MED FILL — CYCLOBENZAPRINE 5 MG TABLET: 5 | 30 days supply | Qty: 60 | Fill #0

## 2018-12-09 MED FILL — LANTUS SOLOSTAR 100 UNITS/M: 100 | 90 days supply | Qty: 21 | Fill #1 | Status: TO

## 2018-12-11 MED FILL — UNIFINE PENTIPS 32GX5/32: 32G X 4 MM | 33 days supply | Qty: 200 | Fill #8

## 2018-12-11 MED FILL — UNIFINE PENTIPS 32GX5/32": 32G X 4 MM | 33 days supply | Qty: 200 | Fill #8

## 2018-12-11 MED FILL — VICTOZA 2-PAK 18 MG/3 ML PE: 18 | 30 days supply | Qty: 6 | Fill #12

## 2018-12-16 ENCOUNTER — Other Ambulatory Visit: Payer: Self-pay

## 2018-12-16 ENCOUNTER — Encounter: Payer: Self-pay | Admitting: Internal Medicine

## 2018-12-16 ENCOUNTER — Ambulatory Visit (INDEPENDENT_AMBULATORY_CARE_PROVIDER_SITE_OTHER): Payer: 59 | Admitting: Internal Medicine

## 2018-12-16 VITALS — BP 143/68 | HR 59 | Temp 97.9°F | Wt 231.1 lb

## 2018-12-16 DIAGNOSIS — E1140 Type 2 diabetes mellitus with diabetic neuropathy, unspecified: Secondary | ICD-10-CM

## 2018-12-16 DIAGNOSIS — B20 Human immunodeficiency virus [HIV] disease: Secondary | ICD-10-CM

## 2018-12-16 DIAGNOSIS — R001 Bradycardia, unspecified: Secondary | ICD-10-CM | POA: Diagnosis not present

## 2018-12-16 DIAGNOSIS — R740 Nonspecific elevation of levels of transaminase and lactic acid dehydrogenase [LDH]: Secondary | ICD-10-CM | POA: Diagnosis not present

## 2018-12-16 DIAGNOSIS — N2889 Other specified disorders of kidney and ureter: Secondary | ICD-10-CM | POA: Diagnosis not present

## 2018-12-16 DIAGNOSIS — I10 Essential (primary) hypertension: Secondary | ICD-10-CM

## 2018-12-16 DIAGNOSIS — Z79899 Other long term (current) drug therapy: Secondary | ICD-10-CM | POA: Diagnosis not present

## 2018-12-16 DIAGNOSIS — R7401 Elevation of levels of liver transaminase levels: Secondary | ICD-10-CM

## 2018-12-16 DIAGNOSIS — Z794 Long term (current) use of insulin: Secondary | ICD-10-CM | POA: Diagnosis not present

## 2018-12-16 LAB — POCT GLYCOSYLATED HEMOGLOBIN (HGB A1C): Hemoglobin A1C: 8.3 % — AB (ref 4.0–5.6)

## 2018-12-16 LAB — GLUCOSE, CAPILLARY: Glucose-Capillary: 94 mg/dL (ref 70–99)

## 2018-12-16 MED ORDER — SILDENAFIL CITRATE 50 MG PO TABS: 50.0000 mg | ORAL_TABLET | Freq: Every day | ORAL | 2 refills | Status: DC | PRN

## 2018-12-16 MED FILL — BIKTARVY 50-200-25 MG TABS: 50-200-25 | 30 days supply | Qty: 30 | Fill #1 | Status: TO

## 2018-12-16 NOTE — Assessment & Plan Note (Signed)
  Transaminitis: Noted to have chronic transaminases with AST 38, ALT 51.  He denies abdominal pain.  Patient has HIV and has chronically been on HAART therapy which could be a contributing factor.  Plan: - Follow-up abdominal ultrasound to rule out hepatic steatosis.

## 2018-12-16 NOTE — Assessment & Plan Note (Signed)
Incidental right renal mass: Per chart review, he was noted to have a subcentimeter hypodense right renal mass in August 2018 and was asked to have follow-up renal protocol MRI in 6 to 12 months.  Plan: - Follow-up MRI abdomen with renal protocol.

## 2018-12-16 NOTE — Assessment & Plan Note (Signed)
Hypertension: This was well-controlled during my last visit with him for which his blood pressure was measured at 124/74.  He takes triamterene-HCTZ 75-50 mg daily, verapamil 240 mg daily, losartan 50 mg daily.  Today, he denies cardiopulmonary symptoms.  Initial BP was 154/77 with pulse of 41 though this could be an error.  Repeat blood pressure was 143/60 with pulse of 59.  He was completely asymptomatic and did not report of dizziness, lightheadedness, chest pain.  His heart rate in the past has ranged 50s-80s.  BP Readings from Last 3 Encounters:  12/16/18 (!) 143/68  11/01/18 (!) 146/80  07/26/18 120/74    Plan: -Continue triamterene-HCTZ 75-50 mg daily -Continue verapamil 240 mg daily -Continue losartan 50 mg daily

## 2018-12-16 NOTE — Assessment & Plan Note (Signed)
HIV: He was last evaluated by Dr. Drucilla Schmidt on November 01, 2018 and advised to continue Bikatarvy and follow-up in clinic 1 year.

## 2018-12-16 NOTE — Patient Instructions (Addendum)
Hi [name],  It was a pleasure taking care of you at the clinic today.  Here are my recommendations after today's visit.     1.  Your A1c today was 8.3% from 7.8% in October.  I would not make any adjustments to your diabetes medication and will just advise that you would make some simple lifestyle modification such as eating less carbohydrates and also exercising regularly 2.  I also noticed your liver enzymes were just slightly up so I am going to order for an ultrasound of your abdomen 3.  Continue taking your blood pressure medications as indicated   Take care   Dr. Eileen Stanford  Please call the internal medicine center clinic if you have any questions or concerns, we may be able to help and keep you from a long and expensive emergency room wait. Our clinic and after hours phone number is [phone], the best time to call is Monday through Friday 9 am to 4 pm but there is always someone available 24/7 if you have an emergency. If you need medication refills please notify your pharmacy one week in advance and they will send [name] a request.

## 2018-12-16 NOTE — Assessment & Plan Note (Addendum)
Type 2 diabetes mellitus: Trevor Fischer last saw me on July 26, 2018 and during our visit he was noted to have a slightly increased A1c at 7.8%.  At the reading of his CGM, he had 22% of hypoglycemic episodes below 70 and his insulin regimen was adjusted.   Today, he reports that he is doing well however states he is not being judicious at watching his diet.  He has his glucometer with him at today's visit and shows an average blood Leukos of 147, highest blood glucose of 283 and lowest of 73.  He denies any hypoglycemic episodes.  He takes Lantus 23 units at night, Humalog 10 units in the morning, 5 units at lunch, 5 units at dinner and 5 units midnight when he has a snack.  Also takes Victoza 1.8 mg daily.  He recently had his diabetic eye exam in October 2019. A1C today was measured at 8.3%  Plan: - Continue Lantus 23 units qhs - Continue Humalog 10 units qAM, 5 units lunch, 5 units dinner, 5 units midnight when eating a snack - Continue Victoza 1.8 mg daily - Trevor Fischer has been counseled on lifestyle modifications and its benefits.

## 2018-12-16 NOTE — Progress Notes (Signed)
   CC: Follow-up diabetes mellitus  HPI:  Mr.Haig L Feick is a 58 y.o. with medical history listed below presenting for routine follow-up and also management of his chronic medical problems.  Please see problem based charting for further details.   Past Medical History:  Diagnosis Date  . Allergic rhinitis   . Degenerative joint disease of knee   . DVT (deep venous thrombosis) (HCC) RLE X 2   Ended anticoagulation in 2012  . GERD (gastroesophageal reflux disease)   . 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 HIV infection ([place])   . Hx MRSA infection   . Hyperlipidemia   . Hypertension   . Pulmonary embolism ([place]) 05/14/2017  . Superficial thrombophlebitis   . Type II diabetes mellitus ([place]) 9/09   Review of Systems  Constitutional: Negative.   HENT: Negative.   Eyes: Negative.   Respiratory: Negative.   Cardiovascular: Negative.   Gastrointestinal: Negative.   Skin: Negative.   Neurological: Negative.     Physical Exam:  Vitals:   12/16/18 1355 12/16/18 1439  BP: (!) 150/77 (!) 143/68  Pulse: (!) 41 (!) 59  Temp: 97.9 F (36.6 C)   TempSrc: Oral   SpO2: 100%   Weight: 231 lb 1.6 oz (104.8 kg)    Physical Exam Vitals signs and nursing note reviewed.  Constitutional:      Appearance: Normal appearance.  HENT:     Head: Normocephalic and atraumatic.  Neck:     Musculoskeletal: Neck supple.  Cardiovascular:     Rate and Rhythm: Regular rhythm. Bradycardia present.     Heart sounds: Normal heart sounds. No murmur.  Pulmonary:     Effort: Pulmonary effort is normal.     Breath sounds: Normal breath sounds. No wheezing, rhonchi or rales.  Abdominal:     General: Bowel sounds are normal.  Musculoskeletal:     Right lower leg: No edema.     Left lower leg: No edema.  Neurological:     Mental Status: He is alert.  Psychiatric:        Mood and Affect: Mood normal.        Behavior: Behavior normal.     Assessment & Plan:   See Encounters Tab  for problem based charting.  Patient discussed with Dr. Evette Doffing

## 2018-12-17 ENCOUNTER — Other Ambulatory Visit: Payer: Self-pay | Admitting: Internal Medicine

## 2018-12-17 DIAGNOSIS — E1140 Type 2 diabetes mellitus with diabetic neuropathy, unspecified: Secondary | ICD-10-CM

## 2018-12-17 DIAGNOSIS — Z794 Long term (current) use of insulin: Principal | ICD-10-CM

## 2018-12-17 MED FILL — SILDENAFIL CITRATE 50 MG TA: 50 | 30 days supply | Qty: 5 | Fill #0

## 2018-12-17 MED FILL — GABAPENTIN 800 MG TABLET: 800 | 90 days supply | Qty: 270 | Fill #1

## 2018-12-17 NOTE — Progress Notes (Signed)
Internal Medicine Clinic Attending  Case discussed with Dr. Agyei at the time of the visit.  We reviewed the resident's history and exam and pertinent patient test results.  I agree with the assessment, diagnosis, and plan of care documented in the resident's note.    

## 2018-12-17 NOTE — Addendum Note (Signed)
Addended by: Jean Rosenthal on: 12/17/2018 11:17 AM   Modules accepted: Orders

## 2018-12-19 ENCOUNTER — Other Ambulatory Visit: Payer: Self-pay | Admitting: *Deleted

## 2018-12-19 DIAGNOSIS — E1140 Type 2 diabetes mellitus with diabetic neuropathy, unspecified: Secondary | ICD-10-CM

## 2018-12-19 DIAGNOSIS — Z794 Long term (current) use of insulin: Principal | ICD-10-CM

## 2018-12-19 MED ORDER — INSULIN LISPRO (1 UNIT DIAL) 100 UNIT/ML (KWIKPEN): PEN_INJECTOR | SUBCUTANEOUS | 5 refills | Status: DC

## 2018-12-19 MED FILL — HUMALOG 100 UNITS/ML KWIKPE: 100 | 38 days supply | Qty: 15 | Fill #0 | Status: TO

## 2018-12-19 NOTE — Telephone Encounter (Signed)
Next appt scheduled  6/26 with PCP. 

## 2018-12-24 ENCOUNTER — Ambulatory Visit (HOSPITAL_COMMUNITY): Payer: 59

## 2018-12-25 ENCOUNTER — Other Ambulatory Visit: Payer: Self-pay | Admitting: Internal Medicine

## 2018-12-25 DIAGNOSIS — Z794 Long term (current) use of insulin: Principal | ICD-10-CM

## 2018-12-25 DIAGNOSIS — G8929 Other chronic pain: Secondary | ICD-10-CM

## 2018-12-25 DIAGNOSIS — E1140 Type 2 diabetes mellitus with diabetic neuropathy, unspecified: Secondary | ICD-10-CM

## 2018-12-25 DIAGNOSIS — M5441 Lumbago with sciatica, right side: Principal | ICD-10-CM

## 2018-12-25 MED FILL — PANTOPRAZOLE SOD DR 40 MG T: 40 | 30 days supply | Qty: 30 | Fill #0

## 2018-12-25 MED FILL — ATORVASTATIN 40 MG TABLET: 40 | 90 days supply | Qty: 90 | Fill #0

## 2018-12-25 MED FILL — BACLOFEN 10 MG TABS: 10 | 30 days supply | Qty: 90 | Fill #0

## 2018-12-25 MED FILL — BIKTARVY 50-200-25 MG TABS: 50-200-25 | 30 days supply | Qty: 30 | Fill #0

## 2018-12-25 MED FILL — ELIQUIS 5 MG TABLET: 5 | 90 days supply | Qty: 180 | Fill #0

## 2018-12-31 MED FILL — VICTOZA 18 MG/3 ML INJECT P: 18 | 30 days supply | Qty: 6 | Fill #0

## 2019-01-07 ENCOUNTER — Other Ambulatory Visit: Payer: Self-pay | Admitting: *Deleted

## 2019-01-07 DIAGNOSIS — M5416 Radiculopathy, lumbar region: Secondary | ICD-10-CM

## 2019-01-08 MED ORDER — CYCLOBENZAPRINE HCL 5 MG PO TABS: 5.0000 mg | ORAL_TABLET | Freq: Three times a day (TID) | ORAL | 0 refills | Status: DC | PRN

## 2019-01-08 MED FILL — CYCLOBENZAPRINE HCL 5 MG TA: 5 | 20 days supply | Qty: 60 | Fill #0

## 2019-01-09 ENCOUNTER — Other Ambulatory Visit: Payer: Self-pay | Admitting: Internal Medicine

## 2019-01-09 DIAGNOSIS — M5416 Radiculopathy, lumbar region: Secondary | ICD-10-CM

## 2019-01-14 ENCOUNTER — Other Ambulatory Visit: Payer: Self-pay | Admitting: Internal Medicine

## 2019-01-14 DIAGNOSIS — Z794 Long term (current) use of insulin: Principal | ICD-10-CM

## 2019-01-14 DIAGNOSIS — E1140 Type 2 diabetes mellitus with diabetic neuropathy, unspecified: Secondary | ICD-10-CM

## 2019-01-14 MED FILL — UNIFINE PENTIPS 32GX5/32": 32G X 4 MM | 30 days supply | Qty: 200 | Fill #0

## 2019-01-14 MED FILL — UNIFINE PENTIPS 32GX5/32: 32G X 4 MM | 30 days supply | Qty: 200 | Fill #0

## 2019-01-15 ENCOUNTER — Ambulatory Visit (HOSPITAL_COMMUNITY): Payer: 59

## 2019-01-15 ENCOUNTER — Other Ambulatory Visit (HOSPITAL_COMMUNITY): Payer: 59

## 2019-01-16 ENCOUNTER — Other Ambulatory Visit: Payer: Self-pay

## 2019-01-16 ENCOUNTER — Ambulatory Visit (HOSPITAL_COMMUNITY)
Admission: RE | Admit: 2019-01-16 | Discharge: 2019-01-16 | Disposition: A | Discharge status: Home / Self Care | Payer: 59 | Source: Ambulatory Visit | Attending: Internal Medicine | Admitting: Internal Medicine

## 2019-01-16 DIAGNOSIS — N2889 Other specified disorders of kidney and ureter: Secondary | ICD-10-CM

## 2019-01-16 DIAGNOSIS — N281 Cyst of kidney, acquired: Secondary | ICD-10-CM | POA: Diagnosis not present

## 2019-01-16 LAB — POCT I-STAT CREATININE: Creatinine, Ser: 1 mg/dL (ref 0.61–1.24)

## 2019-01-16 MED ORDER — GADOBUTROL 1 MMOL/ML IV SOLN
10.0000 mL | Freq: Once | INTRAVENOUS | Status: AC | PRN
  Administered 2019-01-16: 10 mL via INTRAVENOUS

## 2019-01-17 ENCOUNTER — Encounter: Payer: Self-pay | Admitting: Internal Medicine

## 2019-01-17 ENCOUNTER — Ambulatory Visit (HOSPITAL_COMMUNITY): Admission: RE | Admit: 2019-01-17 | Payer: 59 | Source: Ambulatory Visit

## 2019-01-17 ENCOUNTER — Telehealth: Payer: Self-pay | Admitting: Internal Medicine

## 2019-01-17 NOTE — Telephone Encounter (Signed)
I tried reaching out to [name] but was unable. I did sent him a letter with his MRI abdomen results.

## 2019-01-17 NOTE — Progress Notes (Signed)
I tried reaching out to Trevor Fischer but unfortunately was unalbe to reach him. I however sent a letter.

## 2019-01-20 ENCOUNTER — Other Ambulatory Visit: Payer: Self-pay | Admitting: Internal Medicine

## 2019-01-20 DIAGNOSIS — Z794 Long term (current) use of insulin: Principal | ICD-10-CM

## 2019-01-20 DIAGNOSIS — E1140 Type 2 diabetes mellitus with diabetic neuropathy, unspecified: Secondary | ICD-10-CM

## 2019-01-20 MED FILL — ACCU-CHEK FASTCLIX LANCETS: 26 days supply | Qty: 102 | Fill #0 | Status: TO

## 2019-01-24 ENCOUNTER — Ambulatory Visit (HOSPITAL_COMMUNITY): Payer: 59

## 2019-01-28 ENCOUNTER — Other Ambulatory Visit: Payer: Self-pay | Admitting: Infectious Disease

## 2019-01-28 MED FILL — LOSARTAN POTASSIUM 50 MG TA: 50 | 30 days supply | Qty: 30 | Fill #0 | Status: TO

## 2019-01-28 MED FILL — PANTOPRAZOLE SOD DR 40 MG T: 40 | 60 days supply | Qty: 60 | Fill #0

## 2019-01-28 MED FILL — VICTOZA 18 MG/3 ML INJECT P: 18 | 30 days supply | Qty: 6 | Fill #1 | Status: TO

## 2019-02-05 ENCOUNTER — Other Ambulatory Visit: Payer: Self-pay | Admitting: Internal Medicine

## 2019-02-05 DIAGNOSIS — G8929 Other chronic pain: Secondary | ICD-10-CM

## 2019-02-05 DIAGNOSIS — M5441 Lumbago with sciatica, right side: Principal | ICD-10-CM

## 2019-02-05 MED FILL — BACLOFEN 10 MG TABS: 10 | 30 days supply | Qty: 90 | Fill #0

## 2019-02-05 MED FILL — CYCLOBENZAPRINE HCL 5 MG TA: 5 | 20 days supply | Qty: 60 | Fill #0 | Status: TO

## 2019-02-05 NOTE — Telephone Encounter (Signed)
Next appt scheduled  6/26 with PCP. 

## 2019-02-12 MED FILL — SIMETHICONE 180 MG CAPS: 180 | 80 days supply | Qty: 240 | Fill #0

## 2019-02-12 MED FILL — BIKTARVY 50-200-25 MG TABS: 50-200-25 | 30 days supply | Qty: 30 | Fill #1

## 2019-02-18 ENCOUNTER — Encounter: Payer: Self-pay | Admitting: *Deleted

## 2019-02-19 ENCOUNTER — Other Ambulatory Visit: Payer: Self-pay | Admitting: Internal Medicine

## 2019-02-19 DIAGNOSIS — I10 Essential (primary) hypertension: Secondary | ICD-10-CM

## 2019-02-19 MED FILL — HUMALOG 100 UNITS/ML KWIKPE: 100 | 38 days supply | Qty: 15 | Fill #0

## 2019-02-19 MED FILL — VERAPAMIL HCL ER 240 MG TBC: 240 | 90 days supply | Qty: 90 | Fill #0

## 2019-02-24 MED FILL — UNIFINE PENTIPS 32GX5/32": 32G X 4 MM | 30 days supply | Qty: 200 | Fill #1

## 2019-02-24 MED FILL — UNIFINE PENTIPS 32GX5/32: 32G X 4 MM | 30 days supply | Qty: 200 | Fill #1

## 2019-02-25 ENCOUNTER — Other Ambulatory Visit: Payer: Self-pay | Admitting: Internal Medicine

## 2019-02-25 MED FILL — LANTUS SOLOSTAR 100 UNITS/M: 100 | 12 days supply | Qty: 3 | Fill #0

## 2019-03-03 ENCOUNTER — Other Ambulatory Visit: Payer: Self-pay | Admitting: Internal Medicine

## 2019-03-03 DIAGNOSIS — M5416 Radiculopathy, lumbar region: Secondary | ICD-10-CM

## 2019-03-03 MED FILL — VICTOZA 2-PAK 18 MG/3 ML PE: 18 | 30 days supply | Qty: 6 | Fill #0

## 2019-03-03 MED FILL — LOSARTAN POTASSIUM 50 MG TA: 50 | 30 days supply | Qty: 30 | Fill #0

## 2019-03-03 MED FILL — CYCLOBENZAPRINE 5 MG TABLET: 5 | 20 days supply | Qty: 60 | Fill #0

## 2019-03-06 MED FILL — BIKTARVY 50-200-25 MG TABS: 50-200-25 | 30 days supply | Qty: 30 | Fill #2

## 2019-03-06 MED FILL — ACCU-CHEK FASTCLIX LANCETS: 26 days supply | Qty: 102 | Fill #0

## 2019-03-06 MED FILL — LANTUS SOLOSTAR 100 UNITS/M: 100 | 90 days supply | Qty: 21 | Fill #0

## 2019-03-06 MED FILL — ACCU-CHEK GUIDE STRP: 90 days supply | Qty: 400 | Fill #1

## 2019-03-07 ENCOUNTER — Other Ambulatory Visit: Payer: Self-pay | Admitting: Internal Medicine

## 2019-03-07 DIAGNOSIS — M5416 Radiculopathy, lumbar region: Secondary | ICD-10-CM

## 2019-03-12 MED FILL — TRIAMTERENE/HCTZ 75/50 TAB: 75-50 | 90 days supply | Qty: 45 | Fill #0

## 2019-03-13 ENCOUNTER — Other Ambulatory Visit: Payer: Self-pay | Admitting: Internal Medicine

## 2019-03-13 ENCOUNTER — Encounter: Payer: Self-pay | Admitting: Internal Medicine

## 2019-03-13 ENCOUNTER — Ambulatory Visit: Payer: 59 | Admitting: Internal Medicine

## 2019-03-13 ENCOUNTER — Other Ambulatory Visit: Payer: Self-pay

## 2019-03-13 VITALS — BP 136/74 | HR 74 | Temp 98.3°F | Wt 230.7 lb

## 2019-03-13 DIAGNOSIS — Z794 Long term (current) use of insulin: Secondary | ICD-10-CM | POA: Diagnosis not present

## 2019-03-13 DIAGNOSIS — E1140 Type 2 diabetes mellitus with diabetic neuropathy, unspecified: Secondary | ICD-10-CM | POA: Diagnosis not present

## 2019-03-13 DIAGNOSIS — Z79899 Other long term (current) drug therapy: Secondary | ICD-10-CM | POA: Diagnosis not present

## 2019-03-13 DIAGNOSIS — I10 Essential (primary) hypertension: Secondary | ICD-10-CM

## 2019-03-13 LAB — POCT GLYCOSYLATED HEMOGLOBIN (HGB A1C): Hemoglobin A1C: 8.9 % — AB (ref 4.0–5.6)

## 2019-03-13 MED ORDER — INSULIN LISPRO (1 UNIT DIAL) 100 UNIT/ML (KWIKPEN): PEN_INJECTOR | SUBCUTANEOUS | 5 refills | Status: DC

## 2019-03-13 MED ORDER — INSULIN LISPRO (1 UNIT DIAL) 100 UNIT/ML (KWIKPEN): 10.0000 [IU] | PEN_INJECTOR | Freq: Four times a day (QID) | SUBCUTANEOUS | 11 refills | Status: DC

## 2019-03-13 NOTE — Patient Instructions (Signed)
Hi Trevor Fischer,   It was good seeing you here at the clinic today.  As we discussed, it is quite challenging to find an appropriate dose for your insulin regimen because of the inconsistency in your meals.  However, here my recommendations after today's visit.  1.  For your Humalog, here is a new regimen 10-7-7-5(with snacks).  Also present that can help you collect calibration and use that to dose your insulin.  I will also ask you to follow-up with [name] so we coordinate your insulin regimen.  Take Care Dr. Eileen Stanford  Please call the internal medicine center clinic if you have any questions or concerns, we may be able to help and keep you from a long and expensive emergency room wait. Our clinic and after hours phone number is [phone], the best time to call is Monday through Friday 9 am to 4 pm but there is always someone available 24/7 if you have an emergency. If you need medication refills please notify your pharmacy one week in advance and they will send [name] a request.

## 2019-03-13 NOTE — Progress Notes (Signed)
   CC: Follow-up diabetes mellitus, hypertension  HPI:  [name] is a 58 y.o. with medical problems listed below here to follow-up on chronic medical issues.  Please see problem based charting for further details.  Past Medical History:  Diagnosis Date  . Allergic rhinitis   . Degenerative joint disease of knee   . DVT (deep venous thrombosis) (HCC) RLE X 2   Ended anticoagulation in 2012  . Flatulence 05/13/2015  . GERD (gastroesophageal reflux disease)   . 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 HIV infection ([place])   . Hx MRSA infection   . Hyperlipidemia   . Hypertension   . Pulmonary embolism ([place]) 05/14/2017  . Superficial thrombophlebitis   . Type II diabetes mellitus ([place]) 9/09   Review of Systems:  As per HPI  Physical Exam:  Vitals:   03/13/19 1512  BP: 136/74  Pulse: 74  Temp: 98.3 F (36.8 C)  TempSrc: Oral  SpO2: 98%  Weight: 230 lb 11.2 oz (104.6 kg)   Physical Exam Vitals signs reviewed.  Constitutional:      General: He is not in acute distress. HENT:     Head: Normocephalic and atraumatic.  Cardiovascular:     Rate and Rhythm: Normal rate.     Heart sounds: Normal heart sounds. No murmur.  Pulmonary:     Effort: Pulmonary effort is normal.     Breath sounds: Normal breath sounds. No wheezing, rhonchi or rales.  Abdominal:     General: Bowel sounds are normal.     Tenderness: There is no abdominal tenderness.  Musculoskeletal:     Right lower leg: No edema.     Left lower leg: No edema.  Neurological:     Mental Status: He is alert.     Assessment & Plan:   See Encounters Tab for problem based charting.  Patient discussed with Dr. Evette Doffing

## 2019-03-13 NOTE — Assessment & Plan Note (Signed)
Hypertension: Well-controlled, remains asymptomatic and denies any cardiopulmonary issues.  BP Readings from Last 3 Encounters:  03/13/19 136/74  12/16/18 (!) 143/68  11/01/18 (!) 146/80   Plan: -Continue triamterene-75-50 mg daily -Continue verapamil 240 mg daily -Continue losartan 50 mg daily

## 2019-03-13 NOTE — Addendum Note (Signed)
Addended by: Truddie Crumble on: 03/13/2019 04:11 PM   Modules accepted: Orders

## 2019-03-13 NOTE — Assessment & Plan Note (Signed)
Insulin-dependent diabetes mellitus: He has been very challenging to control as he has been consistent with meals.  He works night shifts and does not have scheduled meal intake.  His last A1c was 8.3% 3 months ago.  His home CBG has ranged in the 180s-200s.  He has several lows of 70s but denies any hypoglycemic episodes.  In the past, we have tried titrating down his short acting insulin due to hypoglycemia but this has not seemed to help.  Plan: -Continue Lantus 23 units daily  -Continue Humalog 10 units qAM, 7 units lunch, 7 units dinner, 5 units midnight when eating a snack -Continue Victoza 1.8mg  -We started a conversation regarding carb content -Refer to [name] for assistance with carb counting

## 2019-03-14 LAB — GLUCOSE, CAPILLARY: Glucose-Capillary: 117 mg/dL — ABNORMAL HIGH (ref 70–99)

## 2019-03-14 NOTE — Progress Notes (Signed)
Internal Medicine Clinic Attending  Case discussed with Dr. Agyei at the time of the visit.  We reviewed the resident's history and exam and pertinent patient test results.  I agree with the assessment, diagnosis, and plan of care documented in the resident's note.    

## 2019-03-17 ENCOUNTER — Other Ambulatory Visit: Payer: Self-pay

## 2019-03-17 ENCOUNTER — Encounter: Payer: Self-pay | Admitting: Dietician

## 2019-03-17 ENCOUNTER — Ambulatory Visit (INDEPENDENT_AMBULATORY_CARE_PROVIDER_SITE_OTHER): Payer: 59 | Admitting: Dietician

## 2019-03-17 ENCOUNTER — Other Ambulatory Visit: Payer: Self-pay | Admitting: Internal Medicine

## 2019-03-17 DIAGNOSIS — Z794 Long term (current) use of insulin: Secondary | ICD-10-CM | POA: Diagnosis not present

## 2019-03-17 DIAGNOSIS — Z713 Dietary counseling and surveillance: Secondary | ICD-10-CM

## 2019-03-17 DIAGNOSIS — Z6829 Body mass index (BMI) 29.0-29.9, adult: Secondary | ICD-10-CM | POA: Diagnosis not present

## 2019-03-17 DIAGNOSIS — E1140 Type 2 diabetes mellitus with diabetic neuropathy, unspecified: Secondary | ICD-10-CM | POA: Diagnosis not present

## 2019-03-17 MED ORDER — VICTOZA 18 MG/3ML ~~LOC~~ SOPN: 1.8000 mg | PEN_INJECTOR | Freq: Every day | SUBCUTANEOUS | 3 refills | Status: DC

## 2019-03-17 NOTE — Progress Notes (Signed)
Hi [name],   Thanks for seeing Trevor Fischer today. I agree with the plan. I have already given refill for Victoza.

## 2019-03-17 NOTE — Progress Notes (Addendum)
Medical Nutrition Therapy Via Telemedicine (Telephone or Video Assisted Platform):  Appt start time: 0816 end time:  0916 Total time: 60 Visit # 1 this year, patient last visit was 07/26/18 for CGM   03/17/2019 Trevor Fischer [id number]  Assessment:  Primary concerns today: review and   [name] is here for help with his diabetes control today. His schedule has changed due to Covid-19.  Some of his eating had also changed due to free snacks being donated during Covid-19 to his work. It appears her has stopped eating those high fat and sugar snacks( doughnuts and sweetened coffee with creamers) . He wants to get back on track.   reviewed carb counting today and made suggestions for healthier choices and more consistent carb meals and snacks.  Preferred Learning Style: No preference indicated  Learning Readiness:  Ready  ANTHROPOMETRICS: Estimated body mass index is 29.58 kg/m as calculated from the following:   Height as of 07/26/18: 6\' 2"  (1.88 m).   Weight as of this encounter: 230 lb 6.4 oz (104.5 kg).  WEIGHT HISTORY: Wt Readings from Last 5 Encounters:  03/17/19 230 lb 6.4 oz (104.5 kg)  03/13/19 230 lb 11.2 oz (104.6 kg)  12/16/18 231 lb 1.6 oz (104.8 kg)  11/01/18 236 lb (107 kg)  07/26/18 232 lb (105.2 kg)   SLEEP:varable depending on work schedule  MEDICATIONS: lantus 23 units daily at 8 AM, Humalog 10u at 8 am, 7u at 4 PM, 7u at 930 PM, 5u at 2 am misses this dose sometimes, victoza 1.2 mg/dl daily in am BLOOD SUGAR: Lab Results  Component Value Date   HGBA1C 8.9 (A) 03/13/2019   HGBA1C 8.3 (A) 12/16/2018   HGBA1C 7.8 (H) 07/01/2018   HGBA1C 7.2 (A) 03/08/2018   HGBA1C 7.5 12/07/2017    Meter download: average for past 90 days 145 mg/dl , last 30 days 146 mg/dl Most 8 am blood sugar above target, later in day mostly in target. discrepancy between meter and A1c noted ( a1c EAG ~ 200-210)  DIETARY INTAKE: Usual eating pattern includes 3 meals and 2 snacks per  day. Everyday foods include- see below, celery and peanut butter, nuts, fruit Dining Out (times/week): need to assess at future visit 24-hr recall:  B ( 8AM): takes 10 units Humalog, french toast stix& sf syrup, pancakes, sf syrup, eggs 2 mini bagles   L ( 12 PM): sandwich( does not take insulin)  D ( 4-5 PM): takes 7 units Humalog- pizza, spaghetti, steak & potato, veggie, pasta salad or fish chicken brown rice, veggie Snk (930PM): Takes 7 units Humalog- chips, fruit, leftovers, fruit Snack(230 AM)- Takes 5 units Humalog- pork rinds, ice cream Beverages: water, oj, choc. milk  Usual physical activity: work and yard work  Estimated energy needs: 2300-2600 calories 230-280 g carbohydrates  Progress Towards Goal(s):  In progress.   Nutritional Diagnosis:  Mathews-2.3 Food-medication interaction As related to not covering food intake with insulin or other diabetes medication.  As evidenced by his report.    Intervention:  Nutrition education about options for better diabetes control. Action Goal:see after visit summary- Trial of Tresiba and keeping carbs consistent and possibly lower  Outcome goal: lower blood sugars and a1C Coordination of care: request increasing Victoza to 1.8mg /day, approval for trial of tresiba and changing time of long acting insulin administration to 8 AM  Teaching Method Utilized: Visual, Auditory,Hands on Handouts given during visit include:carb counting books, avs.  Barriers to learning/adherence to lifestyle change: competing values, stress  of work Demonstrated degree of understanding via:  Teach Back   Monitoring/Evaluation:  Ms. Spivack will contact us in 1 week to let us know if he likes [name] and wants to continue on it. Dietary intake, exercise, meter, and body weight in 4 week(s). If needed [name], RD 03/17/2019 10:09 AM.

## 2019-03-17 NOTE — Patient Instructions (Addendum)
Tips - try to keep breakfast carbs consistent by having about 6 oz orange juice or 8 oz chocolate milk  Try to have salad or other veggie with pizza to help me decrease the number of slices I eat at one time  Ideas for 2 AM- greek yogurt with strawberries, celery peanut butter, apple peanut butter  Other changes to lower your A1C:  Sample of Tresiba  23 units daily  To try for next week With approval increase victoza to 1.8 mg/dl  Carb amounts at meals and snacks based on what you told me you are eating today: 8 AM- 60 grams 12 PM- 45 grams 4-5 PM- 60 grams 930 PM- 60 grams 2 AM- 45 grams Total is 270-280 grams/day ( ~44- 45% of total estimated calorie intake)    You can try to lower your carb intake by adding more protein (chicken/fish/ tofu) and healthy fats (nuts/seeds/avocados) which could result in lower need for insulin.  [name] [phone]

## 2019-03-19 MED FILL — CYCLOBENZAPRINE 5 MG TABLET: 5 | 20 days supply | Qty: 60 | Fill #1

## 2019-03-21 MED FILL — HUMALOG 100 UNITS/ML KWIKPE: 100 | 38 days supply | Qty: 15 | Fill #0

## 2019-03-24 ENCOUNTER — Other Ambulatory Visit: Payer: Self-pay | Admitting: Internal Medicine

## 2019-03-24 ENCOUNTER — Telehealth: Payer: Self-pay | Admitting: Dietician

## 2019-03-24 DIAGNOSIS — Z794 Long term (current) use of insulin: Secondary | ICD-10-CM

## 2019-03-24 DIAGNOSIS — G8929 Other chronic pain: Secondary | ICD-10-CM

## 2019-03-24 DIAGNOSIS — E1140 Type 2 diabetes mellitus with diabetic neuropathy, unspecified: Secondary | ICD-10-CM

## 2019-03-24 MED ORDER — TRESIBA FLEXTOUCH 100 UNIT/ML ~~LOC~~ SOPN: 23.0000 [IU] | PEN_INJECTOR | Freq: Every day | SUBCUTANEOUS | 3 refills | Status: DC

## 2019-03-24 MED FILL — UNIFINE PENTIPS 32GX5/32": 32G X 4 MM | 30 days supply | Qty: 200 | Fill #0

## 2019-03-24 MED FILL — ATORVASTATIN 40 MG TABLET: 40 | 90 days supply | Qty: 90 | Fill #0

## 2019-03-24 MED FILL — UNIFINE PENTIPS 32GX5/32: 32G X 4 MM | 30 days supply | Qty: 200 | Fill #0

## 2019-03-24 MED FILL — TRESIBA FLEXTOUCH 100 UNITS: 100 | 90 days supply | Qty: 21 | Fill #0

## 2019-03-24 MED FILL — BACLOFEN 10 MG TABS: 10 | 30 days supply | Qty: 90 | Fill #0

## 2019-03-24 NOTE — Telephone Encounter (Signed)
Hi Dr. Eileen Stanford,   May I suggest a quantity of 7 pens or 2100 mL to give him a 90 day supply of the [place] per his request?   Thank you!  [name]

## 2019-03-24 NOTE — Telephone Encounter (Signed)
Ok perfect. Thanks  °

## 2019-03-24 NOTE — Telephone Encounter (Addendum)
Trevor Fischer is requesting a prescription for Tresiba. He was given a sample to try at his last visit here to use instead of the lantus basal insulin. He reports good results with only 2 highs, 2 lows and the rest in target range since starting to use it. He is using the same dose as his lantus- 23 units daily,  would like the prescription sent to [facility] and for 90 day refill.

## 2019-03-24 NOTE — Addendum Note (Signed)
Addended by: Resa Miner on: 03/24/2019 10:18 AM   Modules accepted: Orders

## 2019-03-24 NOTE — Telephone Encounter (Signed)
Hi [name],   Thanks for letting me know and I'm glad his CBGs has been controlled.

## 2019-03-27 MED FILL — VICTOZA 2-PAK 18 MG/3 ML PE: 18 | 30 days supply | Qty: 6 | Fill #1

## 2019-03-28 ENCOUNTER — Encounter: Payer: Self-pay | Admitting: Internal Medicine

## 2019-04-02 MED FILL — ACCU-CHEK FASTCLIX LANCETS: 26 days supply | Qty: 102 | Fill #1

## 2019-04-02 MED FILL — LOSARTAN POTASSIUM 50 MG TA: 50 | 30 days supply | Qty: 30 | Fill #1

## 2019-04-02 MED FILL — EASY TOUCH ALCOHOL 70% PADS: 70 | 90 days supply | Qty: 1500 | Fill #1

## 2019-04-08 ENCOUNTER — Other Ambulatory Visit: Payer: Self-pay | Admitting: Internal Medicine

## 2019-04-08 ENCOUNTER — Other Ambulatory Visit: Payer: Self-pay | Admitting: Infectious Disease

## 2019-04-08 DIAGNOSIS — D6859 Other primary thrombophilia: Secondary | ICD-10-CM

## 2019-04-08 MED FILL — BIKTARVY 50-200-25 MG TABS: 50-200-25 | 30 days supply | Qty: 30 | Fill #0

## 2019-04-08 MED FILL — ELIQUIS 5 MG TABLET: 5 | 90 days supply | Qty: 180 | Fill #0

## 2019-04-08 MED FILL — PANTOPRAZOLE SOD DR 40 MG T: 40 | 60 days supply | Qty: 60 | Fill #0

## 2019-04-14 MED FILL — GABAPENTIN 800 MG TABLET: 800 | 90 days supply | Qty: 270 | Fill #0

## 2019-04-21 MED FILL — VICTOZA 2-PAK 18 MG/3 ML PE: 18 | 30 days supply | Qty: 6 | Fill #2

## 2019-04-29 MED FILL — CYCLOBENZAPRINE 5 MG TABLET: 5 | 20 days supply | Qty: 60 | Fill #2

## 2019-04-29 MED FILL — LOSARTAN POTASSIUM 50 MG TA: 50 | 90 days supply | Qty: 90 | Fill #2

## 2019-04-29 MED FILL — SIMETHICONE 180 MG CAPS: 180 | 80 days supply | Qty: 240 | Fill #0

## 2019-04-29 MED FILL — HUMALOG 100 UNITS/ML KWIKPE: 100 | 38 days supply | Qty: 15 | Fill #1

## 2019-04-29 MED FILL — UNIFINE PENTIPS 32GX5/32: 32G X 4 MM | 30 days supply | Qty: 200 | Fill #1

## 2019-04-29 MED FILL — ACCU-CHEK FASTCLIX LANCETS: 26 days supply | Qty: 102 | Fill #2

## 2019-05-06 ENCOUNTER — Other Ambulatory Visit: Payer: Self-pay | Admitting: Internal Medicine

## 2019-05-06 DIAGNOSIS — G8929 Other chronic pain: Secondary | ICD-10-CM

## 2019-05-06 MED FILL — BACLOFEN 10 MG TABS: 10 | 30 days supply | Qty: 90 | Fill #0

## 2019-05-15 MED FILL — VICTOZA 2-PAK 18 MG/3 ML PE: 18 | 30 days supply | Qty: 6 | Fill #3

## 2019-05-20 MED FILL — BIKTARVY 50-200-25 MG TABS: 50-200-25 | 30 days supply | Qty: 30 | Fill #1

## 2019-05-26 ENCOUNTER — Other Ambulatory Visit: Payer: Self-pay | Admitting: Internal Medicine

## 2019-05-26 DIAGNOSIS — M5416 Radiculopathy, lumbar region: Secondary | ICD-10-CM

## 2019-05-26 MED FILL — CYCLOBENZAPRINE 5 MG TABLET: 5 | 20 days supply | Qty: 60 | Fill #0

## 2019-06-04 ENCOUNTER — Other Ambulatory Visit: Payer: Self-pay | Admitting: Infectious Disease

## 2019-06-04 MED FILL — UNIFINE PENTIPS 32GX5/32: 32G X 4 MM | 30 days supply | Qty: 200 | Fill #2

## 2019-06-04 MED FILL — UNIFINE PENTIPS 32GX5/32": 32G X 4 MM | 30 days supply | Qty: 200 | Fill #2

## 2019-06-04 MED FILL — TRIAMTERENE/HCTZ 75/50 TAB: 75-50 | 90 days supply | Qty: 45 | Fill #1

## 2019-06-04 MED FILL — VERAPAMIL ER 240 MG TABLET: 240 | 90 days supply | Qty: 90 | Fill #0

## 2019-06-04 MED FILL — ACCU-CHEK FASTCLIX LANCETS: 26 days supply | Qty: 102 | Fill #3

## 2019-06-04 MED FILL — PANTOPRAZOLE SOD DR 40 MG T: 40 | 60 days supply | Qty: 60 | Fill #0

## 2019-06-10 ENCOUNTER — Other Ambulatory Visit: Payer: Self-pay | Admitting: Internal Medicine

## 2019-06-10 DIAGNOSIS — G8929 Other chronic pain: Secondary | ICD-10-CM

## 2019-06-10 MED FILL — BACLOFEN 10 MG TABS: 10 | 30 days supply | Qty: 90 | Fill #0

## 2019-06-12 MED FILL — VICTOZA 2-PAK 18 MG/3 ML PE: 18 | 30 days supply | Qty: 6 | Fill #4

## 2019-06-16 DIAGNOSIS — H1131 Conjunctival hemorrhage, right eye: Secondary | ICD-10-CM | POA: Diagnosis not present

## 2019-06-17 MED FILL — BIKTARVY 50-200-25 MG TABS: 50-200-25 | 30 days supply | Qty: 30 | Fill #2

## 2019-06-18 ENCOUNTER — Encounter: Payer: Self-pay | Admitting: Internal Medicine

## 2019-06-18 ENCOUNTER — Ambulatory Visit: Payer: 59 | Admitting: Internal Medicine

## 2019-06-18 ENCOUNTER — Other Ambulatory Visit: Payer: Self-pay

## 2019-06-18 VITALS — BP 123/72 | HR 79 | Temp 98.7°F | Wt 228.0 lb

## 2019-06-18 DIAGNOSIS — B20 Human immunodeficiency virus [HIV] disease: Secondary | ICD-10-CM

## 2019-06-18 DIAGNOSIS — E1140 Type 2 diabetes mellitus with diabetic neuropathy, unspecified: Secondary | ICD-10-CM

## 2019-06-18 DIAGNOSIS — Z794 Long term (current) use of insulin: Secondary | ICD-10-CM

## 2019-06-18 DIAGNOSIS — Z79899 Other long term (current) drug therapy: Secondary | ICD-10-CM

## 2019-06-18 DIAGNOSIS — I10 Essential (primary) hypertension: Secondary | ICD-10-CM | POA: Diagnosis not present

## 2019-06-18 DIAGNOSIS — Z0000 Encounter for general adult medical examination without abnormal findings: Secondary | ICD-10-CM

## 2019-06-18 LAB — POCT GLYCOSYLATED HEMOGLOBIN (HGB A1C): Hemoglobin A1C: 8.1 % — AB (ref 4.0–5.6)

## 2019-06-18 LAB — GLUCOSE, CAPILLARY: Glucose-Capillary: 164 mg/dL — ABNORMAL HIGH (ref 70–99)

## 2019-06-18 NOTE — Assessment & Plan Note (Signed)
Insulin-dependent diabetes mellitus: Not at goal much improved from my last visit.  His A1c today is 8.1% from 8.9% 3 months ago.  Recently, his long-acting insulin was changed from Lantus to [place] and that seems to be working as he is headed in the right direction.  Plan: -Congratulated him on his improvement on the A1c -Continue Tresiba 23 units -Continue Humalog 10 units qAM, 7 units lunch, 7 units dinner, 5 units midnight -Continue Victoza 1.8 mg daily

## 2019-06-18 NOTE — Assessment & Plan Note (Signed)
Hypertension-well-controlled: He is at goal we will not make any medication changes.  BP Readings from Last 3 Encounters:  03/13/19 136/74  12/16/18 (!) 143/68  11/01/18 (!) 146/80   Plan: -Continue triamterene-HCTZ 75-50 mg daily -Continue verapamil 240 mg daily -Continue losartan 50 mg daily

## 2019-06-18 NOTE — Assessment & Plan Note (Signed)
Health maintenance: -Flu vaccine today

## 2019-06-18 NOTE — Assessment & Plan Note (Signed)
HIV: Well-controlled.  His last visit with infectious disease was January of this year.  CD4 count of 380.  Plan: - Continue Biktarvy

## 2019-06-18 NOTE — Progress Notes (Signed)
   CC: Follow-up hypertension, diabetes mellitus  HPI:  [name] is a 58 y.o. very pleasant African-American gentleman with medical history listed below presenting to follow-up on chronic medical problems.  Please see problem based charting for further details.  Past Medical History:  Diagnosis Date  . Allergic rhinitis   . Degenerative joint disease of knee   . DVT (deep venous thrombosis) (HCC) RLE X 2   Ended anticoagulation in 2012  . Flatulence 05/13/2015  . GERD (gastroesophageal reflux disease)   . 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 HIV infection ([place])   . Hx MRSA infection   . Hyperlipidemia   . Hypertension   . Pulmonary embolism ([place]) 05/14/2017  . Superficial thrombophlebitis   . Type II diabetes mellitus ([place]) 9/09   Review of Systems: As per HPI  Physical Exam:  Vitals:   06/18/19 1605  BP: 123/72  Pulse: 79  Temp: 98.7 F (37.1 C)  TempSrc: Oral  Weight: 228 lb (103.4 kg)    Physical Exam  Constitutional: He is well-developed, well-nourished, and in no distress.  HENT:  Head: Normocephalic and atraumatic.  Eyes: Conjunctivae are normal.  Cardiovascular: Normal rate, regular rhythm and normal heart sounds.  No murmur heard. Pulmonary/Chest: Effort normal and breath sounds normal. He has no wheezes. He has no rales.    Assessment & Plan:   See Encounters Tab for problem based charting.  Patient discussed with Dr. Evette Doffing

## 2019-06-18 NOTE — Patient Instructions (Signed)
Hi Trevor Fischer,   It was a pleasure taking care of you today. Overall I believe we are heading in the right direction. Please continue doing exactly what you've been doing.   Take Care! Dr. Eileen Stanford  Please call the internal medicine center clinic if you have any questions or concerns, we may be able to help and keep you from a long and expensive emergency room wait. Our clinic and after hours phone number is [phone], the best time to call is Monday through Friday 9 am to 4 pm but there is always someone available 24/7 if you have an emergency. If you need medication refills please notify your pharmacy one week in advance and they will send [name] a request.

## 2019-06-19 LAB — BMP8+ANION GAP
Anion Gap: 13 mmol/L (ref 10.0–18.0)
BUN/Creatinine Ratio: 14 (ref 9–20)
BUN: 18 mg/dL (ref 6–24)
CO2: 26 mmol/L (ref 20–29)
Calcium: 9.2 mg/dL (ref 8.7–10.2)
Chloride: 101 mmol/L (ref 96–106)
Creatinine, Ser: 1.27 mg/dL (ref 0.76–1.27)
GFR calc Af Amer: 71 mL/min/{1.73_m2} (ref >59)
GFR calc non Af Amer: 61 mL/min/{1.73_m2} (ref >59)
Glucose: 119 mg/dL — ABNORMAL HIGH (ref 65–99)
Potassium: 4.1 mmol/L (ref 3.5–5.2)
Sodium: 140 mmol/L (ref 134–144)

## 2019-06-19 MED FILL — TRESIBA FLEXTOUCH 100 UNITS: 100 | 90 days supply | Qty: 21 | Fill #1

## 2019-06-19 NOTE — Progress Notes (Signed)
Internal Medicine Clinic Attending  Case discussed with Dr. Agyei at the time of the visit.  We reviewed the resident's history and exam and pertinent patient test results.  I agree with the assessment, diagnosis, and plan of care documented in the resident's note.    

## 2019-06-24 ENCOUNTER — Other Ambulatory Visit: Payer: Self-pay | Admitting: Internal Medicine

## 2019-06-24 DIAGNOSIS — Z794 Long term (current) use of insulin: Secondary | ICD-10-CM

## 2019-06-24 DIAGNOSIS — E1140 Type 2 diabetes mellitus with diabetic neuropathy, unspecified: Secondary | ICD-10-CM

## 2019-06-24 MED FILL — CYCLOBENZAPRINE 5 MG TABLET: 5 | 20 days supply | Qty: 60 | Fill #1

## 2019-06-25 MED FILL — ATORVASTATIN 40 MG TABLET: 40 | 90 days supply | Qty: 90 | Fill #0

## 2019-07-04 ENCOUNTER — Other Ambulatory Visit: Payer: Self-pay | Admitting: *Deleted

## 2019-07-04 DIAGNOSIS — E1140 Type 2 diabetes mellitus with diabetic neuropathy, unspecified: Secondary | ICD-10-CM

## 2019-07-04 MED ORDER — VICTOZA 18 MG/3ML ~~LOC~~ SOPN: 1.8000 mg | PEN_INJECTOR | Freq: Every day | SUBCUTANEOUS | 3 refills | Status: DC

## 2019-07-04 MED FILL — HUMALOG 100 UNITS/ML KWIKPE: 100 | 38 days supply | Qty: 15 | Fill #2

## 2019-07-04 MED FILL — VICTOZA 18 MG/3 ML INJECT P: 18 | 90 days supply | Qty: 27 | Fill #0

## 2019-07-09 ENCOUNTER — Other Ambulatory Visit: Payer: Self-pay | Admitting: Internal Medicine

## 2019-07-09 DIAGNOSIS — D6859 Other primary thrombophilia: Secondary | ICD-10-CM

## 2019-07-09 MED FILL — UNIFINE PENTIPS 32GX5/32": 32G X 4 MM | 30 days supply | Qty: 200 | Fill #3

## 2019-07-09 MED FILL — ELIQUIS 5 MG TABLET: 5 | 90 days supply | Qty: 180 | Fill #0

## 2019-07-09 MED FILL — ACCU-CHEK FASTCLIX LANCETS: 26 days supply | Qty: 102 | Fill #4

## 2019-07-09 MED FILL — UNIFINE PENTIPS 32GX5/32: 32G X 4 MM | 30 days supply | Qty: 200 | Fill #3

## 2019-07-09 MED FILL — SIMETHICONE 180 MG CAPS: 180 | 80 days supply | Qty: 240 | Fill #1

## 2019-07-17 ENCOUNTER — Other Ambulatory Visit: Payer: Self-pay | Admitting: Internal Medicine

## 2019-07-17 DIAGNOSIS — M5441 Lumbago with sciatica, right side: Secondary | ICD-10-CM

## 2019-07-17 DIAGNOSIS — G8929 Other chronic pain: Secondary | ICD-10-CM

## 2019-07-17 MED FILL — BIKTARVY 50-200-25 MG TABS: 50-200-25 | 30 days supply | Qty: 30 | Fill #3

## 2019-07-17 MED FILL — BACLOFEN 10 MG TABS: 10 | 30 days supply | Qty: 90 | Fill #0

## 2019-07-17 MED FILL — GABAPENTIN 800 MG TABLET: 800 | 90 days supply | Qty: 270 | Fill #1

## 2019-07-17 MED FILL — CYCLOBENZAPRINE 5 MG TABLET: 5 | 20 days supply | Qty: 60 | Fill #2

## 2019-07-30 MED FILL — PANTOPRAZOLE SOD DR 40 MG T: 40 | 60 days supply | Qty: 60 | Fill #1

## 2019-07-30 MED FILL — LOSARTAN POTASSIUM 50 MG TA: 50 | 90 days supply | Qty: 90 | Fill #3

## 2019-08-12 ENCOUNTER — Other Ambulatory Visit: Payer: Self-pay

## 2019-08-12 ENCOUNTER — Ambulatory Visit: Payer: 59 | Admitting: Podiatry

## 2019-08-12 DIAGNOSIS — L84 Corns and callosities: Secondary | ICD-10-CM

## 2019-08-12 DIAGNOSIS — M79674 Pain in right toe(s): Secondary | ICD-10-CM

## 2019-08-12 DIAGNOSIS — M79675 Pain in left toe(s): Secondary | ICD-10-CM

## 2019-08-12 DIAGNOSIS — B351 Tinea unguium: Secondary | ICD-10-CM

## 2019-08-12 DIAGNOSIS — E1165 Type 2 diabetes mellitus with hyperglycemia: Secondary | ICD-10-CM | POA: Diagnosis not present

## 2019-08-14 MED FILL — UNIFINE PENTIPS 32GX5/32: 32G X 4 MM | 30 days supply | Qty: 200 | Fill #4

## 2019-08-14 MED FILL — UNIFINE PENTIPS 32GX5/32": 32G X 4 MM | 30 days supply | Qty: 200 | Fill #4

## 2019-08-15 ENCOUNTER — Other Ambulatory Visit: Payer: Self-pay | Admitting: Internal Medicine

## 2019-08-15 DIAGNOSIS — Z794 Long term (current) use of insulin: Secondary | ICD-10-CM

## 2019-08-15 DIAGNOSIS — E1140 Type 2 diabetes mellitus with diabetic neuropathy, unspecified: Secondary | ICD-10-CM

## 2019-08-15 DIAGNOSIS — I10 Essential (primary) hypertension: Secondary | ICD-10-CM

## 2019-08-15 MED FILL — CYCLOBENZAPRINE 5 MG TABLET: 5 | 20 days supply | Qty: 60 | Fill #3

## 2019-08-15 MED FILL — BIKTARVY 50-200-25 MG TABS: 50-200-25 | 30 days supply | Qty: 30 | Fill #4

## 2019-08-15 MED FILL — ACCU-CHEK FASTCLIX LANCETS: 26 days supply | Qty: 102 | Fill #5

## 2019-08-15 MED FILL — HUMALOG 100 UNITS/ML KWIKPE: 100 | 38 days supply | Qty: 15 | Fill #3

## 2019-08-18 MED FILL — ACCU-CHEK GUIDE STRP: 90 days supply | Qty: 400 | Fill #0

## 2019-08-18 MED FILL — VERAPAMIL ER 240 MG TABLET: 240 | 90 days supply | Qty: 90 | Fill #0

## 2019-08-19 ENCOUNTER — Other Ambulatory Visit: Payer: Self-pay | Admitting: *Deleted

## 2019-08-19 DIAGNOSIS — I10 Essential (primary) hypertension: Secondary | ICD-10-CM

## 2019-08-19 MED ORDER — TRIAMTERENE-HCTZ 75-50 MG PO TABS: 0.5000 | ORAL_TABLET | Freq: Every day | ORAL | 1 refills | Status: DC

## 2019-08-19 NOTE — Progress Notes (Signed)
Subjective: 58 year old male presents the office today for concern dry skin, callus to his right big toe that occasionally crack and bleed.  Currently denies any drainage or pus.  Also asking for his nails be trimmed today's are thickened elongated he cannot do them himself.  He is diabetic and his A1c is 8.9. Denies any systemic complaints such as fevers, chills, nausea, vomiting. No acute changes since last appointment, and no other complaints at this time.   Objective: AAO x3, NAD DP/PT pulses palpable bilaterally, CRT less than 3 seconds Hyperkeratotic tissue to the right hallux.  No underlying ulceration identified today.  There is no drainage or pus or any open sores. Nails are hypertrophic, dystrophic, brittle, discolored, elongated 10. No surrounding redness or drainage. Tenderness nails 1-5 bilaterally. No open lesions or pre-ulcerative lesions are identified today. No open lesions or pre-ulcerative lesions.  No pain with calf compression, swelling, warmth, erythema  Assessment: 58 year old male with hyperkeratotic lesion, symptomatic onychomycosis  Plan: -All treatment options discussed with the patient including all alternatives, risks, complications.  -Hyperkeratotic lesion sharply debrided without any complications or bleeding.  Recommend moisturizer daily.  Discussed the miracle foot cream. -Nails debrided x10 without any complications or bleeding -Patient encouraged to call the office with any questions, concerns, change in symptoms.   Return in about 3 months (around 11/12/2019).  Trula Slade DPM

## 2019-08-20 MED FILL — TRIAMTERENE-HCTZ 75-50 MG T: 75-50 | 90 days supply | Qty: 45 | Fill #0

## 2019-09-02 ENCOUNTER — Other Ambulatory Visit: Payer: Self-pay | Admitting: Internal Medicine

## 2019-09-02 DIAGNOSIS — M5441 Lumbago with sciatica, right side: Secondary | ICD-10-CM

## 2019-09-02 DIAGNOSIS — G8929 Other chronic pain: Secondary | ICD-10-CM

## 2019-09-03 MED FILL — BACLOFEN 10 MG TABS: 10 | 30 days supply | Qty: 90 | Fill #0

## 2019-09-10 DIAGNOSIS — E119 Type 2 diabetes mellitus without complications: Secondary | ICD-10-CM | POA: Diagnosis not present

## 2019-09-10 LAB — HM DIABETES EYE EXAM

## 2019-09-16 ENCOUNTER — Encounter: Payer: Self-pay | Admitting: *Deleted

## 2019-09-16 ENCOUNTER — Other Ambulatory Visit: Payer: Self-pay | Admitting: Internal Medicine

## 2019-09-16 DIAGNOSIS — M5416 Radiculopathy, lumbar region: Secondary | ICD-10-CM

## 2019-09-16 MED FILL — ATORVASTATIN 40 MG TABLET: 40 | 90 days supply | Qty: 90 | Fill #1

## 2019-09-16 MED FILL — CYCLOBENZAPRINE 5 MG TABLET: 5 | 20 days supply | Qty: 60 | Fill #0

## 2019-09-17 ENCOUNTER — Ambulatory Visit (INDEPENDENT_AMBULATORY_CARE_PROVIDER_SITE_OTHER): Payer: 59 | Admitting: Internal Medicine

## 2019-09-17 ENCOUNTER — Other Ambulatory Visit: Payer: Self-pay

## 2019-09-17 ENCOUNTER — Encounter: Payer: Self-pay | Admitting: Internal Medicine

## 2019-09-17 VITALS — BP 153/78 | HR 73 | Temp 97.4°F | Ht 74.0 in | Wt 234.3 lb

## 2019-09-17 DIAGNOSIS — M5127 Other intervertebral disc displacement, lumbosacral region: Secondary | ICD-10-CM

## 2019-09-17 DIAGNOSIS — M5416 Radiculopathy, lumbar region: Secondary | ICD-10-CM

## 2019-09-17 DIAGNOSIS — Q181 Preauricular sinus and cyst: Secondary | ICD-10-CM

## 2019-09-17 DIAGNOSIS — G8921 Chronic pain due to trauma: Secondary | ICD-10-CM | POA: Diagnosis not present

## 2019-09-17 DIAGNOSIS — E1140 Type 2 diabetes mellitus with diabetic neuropathy, unspecified: Secondary | ICD-10-CM | POA: Diagnosis not present

## 2019-09-17 DIAGNOSIS — I10 Essential (primary) hypertension: Secondary | ICD-10-CM | POA: Diagnosis not present

## 2019-09-17 DIAGNOSIS — Z794 Long term (current) use of insulin: Secondary | ICD-10-CM | POA: Diagnosis not present

## 2019-09-17 DIAGNOSIS — Z0000 Encounter for general adult medical examination without abnormal findings: Secondary | ICD-10-CM

## 2019-09-17 DIAGNOSIS — E119 Type 2 diabetes mellitus without complications: Secondary | ICD-10-CM | POA: Diagnosis not present

## 2019-09-17 DIAGNOSIS — Z9111 Patient's noncompliance with dietary regimen: Secondary | ICD-10-CM | POA: Diagnosis not present

## 2019-09-17 DIAGNOSIS — L729 Follicular cyst of the skin and subcutaneous tissue, unspecified: Secondary | ICD-10-CM

## 2019-09-17 DIAGNOSIS — Z79899 Other long term (current) drug therapy: Secondary | ICD-10-CM | POA: Diagnosis not present

## 2019-09-17 LAB — POCT GLYCOSYLATED HEMOGLOBIN (HGB A1C): Hemoglobin A1C: 8.3 % — AB (ref 4.0–5.6)

## 2019-09-17 LAB — GLUCOSE, CAPILLARY: Glucose-Capillary: 87 mg/dL (ref 70–99)

## 2019-09-17 MED ORDER — JARDIANCE 10 MG PO TABS: 10.0000 mg | ORAL_TABLET | Freq: Every day | ORAL | 3 refills | Status: DC

## 2019-09-17 MED FILL — UNIFINE PENTIPS 32GX5/32": 32G X 4 MM | 30 days supply | Qty: 200 | Fill #5

## 2019-09-17 MED FILL — UNIFINE PENTIPS 32GX5/32: 32G X 4 MM | 30 days supply | Qty: 200 | Fill #5

## 2019-09-17 MED FILL — JARDIANCE 10 MG TABLET: 10 | 30 days supply | Qty: 30 | Fill #0

## 2019-09-17 NOTE — Progress Notes (Signed)
   CC: Follow-up diabetes mellitus, hypertension  HPI:  Trevor Fischer is a 58 y.o. very pleasant African-American gentleman with medical history listed below presented to follow-up on chronic medical problems.  Please see problem based charting for further details.   Past Medical History:  Diagnosis Date  . Allergic rhinitis   . Degenerative joint disease of knee   . DVT (deep venous thrombosis) (HCC) RLE X 2   Ended anticoagulation in 2012  . Flatulence 05/13/2015  . GERD (gastroesophageal reflux disease)   . 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 HIV infection ([place])   . Hx MRSA infection   . Hyperlipidemia   . Hypertension   . Pulmonary embolism ([place]) 05/14/2017  . Superficial thrombophlebitis   . Type II diabetes mellitus ([place]) 9/09   Review of Systems: As per HPI  Physical Exam:  Vitals:   09/17/19 1516  BP: (!) 153/78  Pulse: 73  Temp: (!) 97.4 F (36.3 C)  TempSrc: Oral  SpO2: 100%  Weight: 234 lb 4.8 oz (106.3 kg)  Height: 6\' 2"  (1.88 m)   Physical Exam  Constitutional: He is well-developed, well-nourished, and in no distress. No distress.  Cardiovascular: Normal rate, regular rhythm and normal heart sounds. Exam reveals no friction rub.  No murmur heard. Pulmonary/Chest: Effort normal and breath sounds normal. No respiratory distress. He has no wheezes.  Musculoskeletal:        General: No tenderness, deformity or edema. Normal range of motion.  Skin: He is not diaphoretic.    Assessment & Plan:   See Encounters Tab for problem based charting.  Patient discussed with Dr. Heber Elberon

## 2019-09-17 NOTE — Assessment & Plan Note (Signed)
Insulin-dependent diabetes mellitus: Unfortunately he does not have his glucometer with him but he tells me his CBGs at home are between 80s-90s.  He denies any hypoglycemic episodes.  He is very compliant with his insulin regimen however he tells me he continues to eat doughnuts, cake, ice cream and "junk food.  "  Plan: -Continue Tresiba 23 units -Continue Humalog 10 units qAM, 7 units lunch, 7 units dinner, 5 units midnight -Continue Victoza 1.8 mg daily -ADD Jardiance 10 mg daily (plan to slowly titrate down insulin requirement) -Strongly advised on dietary modification -Begin discussion for possible initiation of Xultophy for convenience and he expressed understanding

## 2019-09-17 NOTE — Patient Instructions (Signed)
Hi Trevor Fischer,   As we discussed, your A1c today was 8.8% from 8.1% last time.  I believe on plan we can do this at an oral medication called Jardiance.  I will send this to the pharmacy to inquire about the price first.  Please continue checking your blood sugar and taking all your medications.  Take care! Dr. Eileen Stanford  Please call the internal medicine center clinic if you have any questions or concerns, we may be able to help and keep you from a long and expensive emergency room wait. Our clinic and after hours phone number is [phone], the best time to call is Monday through Friday 9 am to 4 pm but there is always someone available 24/7 if you have an emergency. If you need medication refills please notify your pharmacy one week in advance and they will send [name] a request.

## 2019-09-17 NOTE — Assessment & Plan Note (Signed)
Low back pain: In April 2019 he suffered a work-related accident after he tried lifting up a 600 pound patient.   MRI lumbar spine as below Very large right posterolateral disc herniation at L5-S1 with extruded disc material showing upward and downward migration, quite likely to cause right-sided neural compression. L4-5 facet arthropathy with 2 mm of anterolisthesis. Bulging of the disc. Narrowing of the lateral recesses and neural foramina that could possibly be symptomatic. L3-4 disc bulge with mild lateral recess narrowing. Bulging disc material contacts the right foraminal to extraforaminal L3 nerve and could possibly irritate that structure  He tells me that he followed up with neurosurgery from the not recommended surgical intervention however he elected for nonsurgical management.  He underwent physical therapy.  He states that currently, his only symptoms are morning stiffness and denies lower extremity weakness, urinary incontinence, bowel incontinence, fevers, chills, point tenderness.  On physical exams, there is no point tenderness of the lumbar area.  Straight leg test is negative bilaterally.  Lower extremity strength intact.  Plan: -Declined physical therapy -Advised on conservative management with alternating doses of Tylenol and ibuprofen

## 2019-09-17 NOTE — Assessment & Plan Note (Signed)
Hypertension: Not at goal today.  BP Readings from Last 3 Encounters:  09/17/19 (!) 153/78  06/18/19 123/72  03/13/19 136/74   Plan: -Continue triamterene-HCTZ 75-50 mg daily -Continue verapamil 240 mg daily -Continue losartan 50 mg daily

## 2019-09-18 NOTE — Addendum Note (Signed)
Addended by: Jean Rosenthal on: 09/18/2019 09:09 PM   Modules accepted: Orders

## 2019-09-19 NOTE — Progress Notes (Signed)
Internal Medicine Clinic Attending  Case discussed with Dr. Agyei at the time of the visit.  We reviewed the resident's history and exam and pertinent patient test results.  I agree with the assessment, diagnosis, and plan of care documented in the resident's note.    

## 2019-09-22 MED FILL — BIKTARVY 50-200-25 MG TABS: 50-200-25 | 30 days supply | Qty: 30 | Fill #0

## 2019-09-29 MED FILL — ACCU-CHEK FASTCLIX LANCETS: 26 days supply | Qty: 102 | Fill #6

## 2019-09-29 MED FILL — HUMALOG 100 UNITS/ML KWIKPE: 100 | 76 days supply | Qty: 30 | Fill #4

## 2019-09-29 MED FILL — SILDENAFIL CITRATE 50 MG TA: 50 | 30 days supply | Qty: 5 | Fill #1

## 2019-09-29 MED FILL — PANTOPRAZOLE SOD DR 40 MG T: 40 | 90 days supply | Qty: 90 | Fill #2

## 2019-09-29 MED FILL — ELIQUIS 5 MG TABLET: 5 | 90 days supply | Qty: 180 | Fill #1

## 2019-09-29 MED FILL — TRESIBA FLEXTOUCH 100 UNITS: 100 | 90 days supply | Qty: 21 | Fill #2

## 2019-09-30 ENCOUNTER — Other Ambulatory Visit: Payer: Self-pay | Admitting: *Deleted

## 2019-09-30 DIAGNOSIS — R143 Flatulence: Secondary | ICD-10-CM

## 2019-09-30 MED FILL — VICTOZA 18 MG/3 ML INJECT P: 18 | 90 days supply | Qty: 27 | Fill #0

## 2019-10-01 MED ORDER — SIMETHICONE 180 MG PO CAPS: 180.0000 mg | ORAL_CAPSULE | Freq: Every day | ORAL | 3 refills | Status: DC

## 2019-10-01 MED FILL — SIMETHICONE 180 MG CAPS: 180 | 30 days supply | Qty: 30 | Fill #0

## 2019-10-14 ENCOUNTER — Other Ambulatory Visit: Payer: Self-pay | Admitting: Internal Medicine

## 2019-10-14 DIAGNOSIS — Z794 Long term (current) use of insulin: Secondary | ICD-10-CM

## 2019-10-14 DIAGNOSIS — E1140 Type 2 diabetes mellitus with diabetic neuropathy, unspecified: Secondary | ICD-10-CM

## 2019-10-14 DIAGNOSIS — G8929 Other chronic pain: Secondary | ICD-10-CM

## 2019-10-14 MED FILL — BACLOFEN 10 MG TABS: 10 | 30 days supply | Qty: 90 | Fill #0

## 2019-10-14 MED FILL — JARDIANCE 10 MG TABLET: 10 | 90 days supply | Qty: 90 | Fill #1

## 2019-10-14 MED FILL — GABAPENTIN 800 MG TABLET: 800 | 90 days supply | Qty: 270 | Fill #0

## 2019-10-14 MED FILL — CYCLOBENZAPRINE 5 MG TABLET: 5 | 20 days supply | Qty: 60 | Fill #1

## 2019-10-21 DIAGNOSIS — Z8371 Family history of colonic polyps: Secondary | ICD-10-CM | POA: Diagnosis not present

## 2019-10-21 DIAGNOSIS — Z1211 Encounter for screening for malignant neoplasm of colon: Secondary | ICD-10-CM | POA: Diagnosis not present

## 2019-10-21 DIAGNOSIS — K51211 Ulcerative (chronic) proctitis with rectal bleeding: Secondary | ICD-10-CM | POA: Diagnosis not present

## 2019-10-27 MED FILL — BIKTARVY 50-200-25 MG TABS: 50-200-25 | 30 days supply | Qty: 30 | Fill #1

## 2019-11-06 ENCOUNTER — Other Ambulatory Visit: Payer: Self-pay | Admitting: Internal Medicine

## 2019-11-06 ENCOUNTER — Other Ambulatory Visit (HOSPITAL_COMMUNITY): Payer: Self-pay | Admitting: Internal Medicine

## 2019-11-06 DIAGNOSIS — E1140 Type 2 diabetes mellitus with diabetic neuropathy, unspecified: Secondary | ICD-10-CM

## 2019-11-06 DIAGNOSIS — Z794 Long term (current) use of insulin: Secondary | ICD-10-CM

## 2019-11-06 MED FILL — SM ALCOHOL 70% PREP PADS: 70 | 60 days supply | Qty: 600 | Fill #0

## 2019-11-06 MED FILL — UNIFINE PENTIPS 32GX5/32: 32G X 4 MM | 30 days supply | Qty: 200 | Fill #6

## 2019-11-06 MED FILL — CYCLOBENZAPRINE 5 MG TABLET: 5 | 20 days supply | Qty: 60 | Fill #2

## 2019-11-06 MED FILL — UNIFINE PENTIPS 32GX5/32": 32G X 4 MM | 30 days supply | Qty: 200 | Fill #6

## 2019-11-10 MED FILL — PEG-3350 SOLUTION: 420 | 1 days supply | Qty: 4000 | Fill #0

## 2019-11-12 MED FILL — LOSARTAN POTASSIUM 50 MG TA: 50 | 90 days supply | Qty: 90 | Fill #4

## 2019-11-13 ENCOUNTER — Other Ambulatory Visit: Payer: Self-pay

## 2019-11-13 ENCOUNTER — Ambulatory Visit: Payer: 59 | Admitting: Podiatry

## 2019-11-13 DIAGNOSIS — L84 Corns and callosities: Secondary | ICD-10-CM

## 2019-11-13 DIAGNOSIS — M79675 Pain in left toe(s): Secondary | ICD-10-CM

## 2019-11-13 DIAGNOSIS — B351 Tinea unguium: Secondary | ICD-10-CM

## 2019-11-13 DIAGNOSIS — M79674 Pain in right toe(s): Secondary | ICD-10-CM

## 2019-11-13 DIAGNOSIS — Z7901 Long term (current) use of anticoagulants: Secondary | ICD-10-CM

## 2019-11-13 DIAGNOSIS — E1165 Type 2 diabetes mellitus with hyperglycemia: Secondary | ICD-10-CM

## 2019-11-17 MED FILL — TRIAMTERENE-HCTZ 75-50 MG T: 75-50 | 90 days supply | Qty: 45 | Fill #1

## 2019-11-18 MED FILL — FREESTYLE LITE TEST STRIP: 25 days supply | Qty: 100 | Fill #0

## 2019-11-18 MED FILL — FREESTYLE LITE METER: 1 days supply | Qty: 1 | Fill #0

## 2019-11-18 MED FILL — FREESTYLE LANCETS: 25 days supply | Qty: 100 | Fill #0

## 2019-11-18 NOTE — Progress Notes (Signed)
Subjective: 59 year old male presents the office today for diabetic foot exam as well as for thick elongated toenails that he cannot trim himself as well as her calluses. Denies any open sores. No claudication symptoms.  His last A1c was 8.3 on September 17, 2019  He is on Eliquis.  On gabapentin for neuropathy  Denies any systemic complaints such as fevers, chills, nausea, vomiting. No acute changes since last appointment, and no other complaints at this time.   Objective: AAO x3, NAD DP/PT pulses palpable bilaterally, CRT less than 3 seconds Hyperkeratotic tissue to the right hallux.  No underlying ulceration identified today.  There is no drainage or pus or any open sores. Nails are hypertrophic, dystrophic, brittle, discolored, elongated 10. No surrounding redness or drainage. Tenderness nails 1-5 bilaterally. No open lesions or pre-ulcerative lesions are identified today. No open lesions or pre-ulcerative lesions.  No pain with calf compression, swelling, warmth, erythema  Assessment: 59 year old male with hyperkeratotic lesion, symptomatic onychomycosis  Plan: -All treatment options discussed with the patient including all alternatives, risks, complications.  -Hyperkeratotic lesion sharply debrided without any complications or bleeding.  Recommend moisturizer daily.  Discussed the miracle foot cream. -Nails debrided x10 without any complications or bleeding -Discussed the importance of foot inspection. -Patient encouraged to call the office with any questions, concerns, change in symptoms.   Return in about 3 months (around 02/10/2020).  Trula Slade DPM

## 2019-11-19 ENCOUNTER — Other Ambulatory Visit: Payer: Self-pay | Admitting: Infectious Disease

## 2019-11-19 DIAGNOSIS — B20 Human immunodeficiency virus [HIV] disease: Secondary | ICD-10-CM

## 2019-11-20 DIAGNOSIS — Z1159 Encounter for screening for other viral diseases: Secondary | ICD-10-CM | POA: Diagnosis not present

## 2019-11-21 ENCOUNTER — Telehealth: Payer: Self-pay | Admitting: *Deleted

## 2019-11-21 NOTE — Telephone Encounter (Signed)
Received call from [facility] - pt is having a procedure on Wednesday; needs to know when to stop and start Eliquis. Needs to know at least by Monday. Telephone # [phone] Thanks

## 2019-11-24 NOTE — Telephone Encounter (Signed)
Talked to Dr Eileen Stanford - stated pt should stop Eliquis 2 days prior to procedure and restart evening after the procedure. Called [facility] and instructions given; stated she will call the pt.

## 2019-11-26 ENCOUNTER — Other Ambulatory Visit: Payer: Self-pay

## 2019-11-26 DIAGNOSIS — K5150 Left sided colitis without complications: Secondary | ICD-10-CM | POA: Diagnosis not present

## 2019-11-26 DIAGNOSIS — K6289 Other specified diseases of anus and rectum: Secondary | ICD-10-CM | POA: Diagnosis not present

## 2019-11-26 DIAGNOSIS — K5730 Diverticulosis of large intestine without perforation or abscess without bleeding: Secondary | ICD-10-CM | POA: Diagnosis not present

## 2019-11-26 DIAGNOSIS — K5289 Other specified noninfective gastroenteritis and colitis: Secondary | ICD-10-CM | POA: Diagnosis not present

## 2019-11-26 DIAGNOSIS — D123 Benign neoplasm of transverse colon: Secondary | ICD-10-CM | POA: Diagnosis not present

## 2019-11-26 DIAGNOSIS — Z8371 Family history of colonic polyps: Secondary | ICD-10-CM | POA: Diagnosis not present

## 2019-11-26 DIAGNOSIS — B20 Human immunodeficiency virus [HIV] disease: Secondary | ICD-10-CM

## 2019-11-26 MED ORDER — BIKTARVY 50-200-25 MG PO TABS: 1.0000 | ORAL_TABLET | Freq: Every day | ORAL | 3 refills | Status: DC

## 2019-11-27 MED FILL — BIKTARVY 50-200-25 MG TABS: 50-200-25 | 30 days supply | Qty: 30 | Fill #0

## 2019-11-28 NOTE — Addendum Note (Signed)
Addended by: Dolan Amen D on: 11/28/2019 11:09 AM   Modules accepted: Orders

## 2019-12-01 ENCOUNTER — Telehealth: Payer: Self-pay

## 2019-12-01 NOTE — Telephone Encounter (Signed)
Error

## 2019-12-02 ENCOUNTER — Other Ambulatory Visit: Payer: 59

## 2019-12-02 ENCOUNTER — Other Ambulatory Visit: Payer: Self-pay

## 2019-12-02 DIAGNOSIS — E1140 Type 2 diabetes mellitus with diabetic neuropathy, unspecified: Secondary | ICD-10-CM | POA: Diagnosis not present

## 2019-12-02 DIAGNOSIS — I10 Essential (primary) hypertension: Secondary | ICD-10-CM | POA: Diagnosis not present

## 2019-12-02 DIAGNOSIS — Z794 Long term (current) use of insulin: Secondary | ICD-10-CM | POA: Diagnosis not present

## 2019-12-02 DIAGNOSIS — G8929 Other chronic pain: Secondary | ICD-10-CM

## 2019-12-02 DIAGNOSIS — I82409 Acute embolism and thrombosis of unspecified deep veins of unspecified lower extremity: Secondary | ICD-10-CM

## 2019-12-02 DIAGNOSIS — B20 Human immunodeficiency virus [HIV] disease: Secondary | ICD-10-CM | POA: Diagnosis not present

## 2019-12-03 LAB — T-HELPER CELL (CD4) - (RCID CLINIC ONLY)
CD4 % Helper T Cell: 38 % (ref 33–65)
CD4 T Cell Abs: 705 /uL (ref 400–1790)

## 2019-12-05 LAB — CBC WITH DIFFERENTIAL/PLATELET
Absolute Monocytes: 513 cells/uL (ref 200–950)
Basophils Absolute: 38 cells/uL (ref 0–200)
Basophils Relative: 0.7 %
Eosinophils Absolute: 194 cells/uL (ref 15–500)
Eosinophils Relative: 3.6 %
HCT: 45.8 % (ref 38.5–50.0)
Hemoglobin: 14.8 g/dL (ref 13.2–17.1)
Lymphs Abs: 2020 cells/uL (ref 850–3900)
MCH: 25.5 pg — ABNORMAL LOW (ref 27.0–33.0)
MCHC: 32.3 g/dL (ref 32.0–36.0)
MCV: 79 fL — ABNORMAL LOW (ref 80.0–100.0)
MPV: 9.5 fL (ref 7.5–12.5)
Monocytes Relative: 9.5 %
Neutro Abs: 2635 cells/uL (ref 1500–7800)
Neutrophils Relative %: 48.8 %
Platelets: 257 10*3/uL (ref 140–400)
RBC: 5.8 10*6/uL (ref 4.20–5.80)
RDW: 12.4 % (ref 11.0–15.0)
Total Lymphocyte: 37.4 %
WBC: 5.4 10*3/uL (ref 3.8–10.8)

## 2019-12-05 LAB — HIV-1 RNA QUANT-NO REFLEX-BLD
HIV 1 RNA Quant: <20 copies/mL
HIV-1 RNA Quant, Log: <1.3 Log copies/mL

## 2019-12-05 LAB — COMPLETE METABOLIC PANEL WITH GFR
AG Ratio: 1.6 (calc) (ref 1.0–2.5)
ALT: 50 U/L — ABNORMAL HIGH (ref 9–46)
AST: 34 U/L (ref 10–35)
Albumin: 4.4 g/dL (ref 3.6–5.1)
Alkaline phosphatase (APISO): 92 U/L (ref 35–144)
BUN: 15 mg/dL (ref 7–25)
CO2: 31 mmol/L (ref 20–32)
Calcium: 9.4 mg/dL (ref 8.6–10.3)
Chloride: 100 mmol/L (ref 98–110)
Creat: 1.24 mg/dL (ref 0.70–1.25)
GFR, Est African American: 73 mL/min/{1.73_m2} (ref >=60)
GFR, Est Non African American: 63 mL/min/{1.73_m2} (ref >=60)
Globulin: 2.7 g/dL (calc) (ref 1.9–3.7)
Glucose, Bld: 156 mg/dL — ABNORMAL HIGH (ref 65–99)
Potassium: 4.1 mmol/L (ref 3.5–5.3)
Sodium: 138 mmol/L (ref 135–146)
Total Bilirubin: 0.5 mg/dL (ref 0.2–1.2)
Total Protein: 7.1 g/dL (ref 6.1–8.1)

## 2019-12-05 LAB — LIPID PANEL
Cholesterol: 172 mg/dL (ref <200)
HDL: 39 mg/dL — ABNORMAL LOW (ref >=40)
LDL Cholesterol (Calc): 97 mg/dL (calc)
Non-HDL Cholesterol (Calc): 133 mg/dL (calc) — ABNORMAL HIGH (ref <130)
Total CHOL/HDL Ratio: 4.4 (calc) (ref <5.0)
Triglycerides: 239 mg/dL — ABNORMAL HIGH (ref <150)

## 2019-12-05 LAB — RPR: RPR Ser Ql: NONREACTIVE

## 2019-12-08 ENCOUNTER — Ambulatory Visit: Payer: 59 | Admitting: Internal Medicine

## 2019-12-08 ENCOUNTER — Other Ambulatory Visit: Payer: Self-pay | Admitting: Internal Medicine

## 2019-12-08 ENCOUNTER — Encounter: Payer: Self-pay | Admitting: Internal Medicine

## 2019-12-08 VITALS — BP 138/81 | HR 75 | Temp 98.1°F | Ht 74.0 in | Wt 227.6 lb

## 2019-12-08 DIAGNOSIS — N2889 Other specified disorders of kidney and ureter: Secondary | ICD-10-CM

## 2019-12-08 DIAGNOSIS — I10 Essential (primary) hypertension: Secondary | ICD-10-CM | POA: Diagnosis not present

## 2019-12-08 DIAGNOSIS — N281 Cyst of kidney, acquired: Secondary | ICD-10-CM | POA: Diagnosis not present

## 2019-12-08 DIAGNOSIS — E0840 Diabetes mellitus due to underlying condition with diabetic neuropathy, unspecified: Secondary | ICD-10-CM

## 2019-12-08 DIAGNOSIS — M5416 Radiculopathy, lumbar region: Secondary | ICD-10-CM

## 2019-12-08 DIAGNOSIS — E1140 Type 2 diabetes mellitus with diabetic neuropathy, unspecified: Secondary | ICD-10-CM

## 2019-12-08 DIAGNOSIS — Z79899 Other long term (current) drug therapy: Secondary | ICD-10-CM

## 2019-12-08 DIAGNOSIS — Z794 Long term (current) use of insulin: Secondary | ICD-10-CM

## 2019-12-08 DIAGNOSIS — G8929 Other chronic pain: Secondary | ICD-10-CM

## 2019-12-08 DIAGNOSIS — R143 Flatulence: Secondary | ICD-10-CM

## 2019-12-08 LAB — POCT GLYCOSYLATED HEMOGLOBIN (HGB A1C): Hemoglobin A1C: 7.8 % — AB (ref 4.0–5.6)

## 2019-12-08 LAB — GLUCOSE, CAPILLARY: Glucose-Capillary: 145 mg/dL — ABNORMAL HIGH (ref 70–99)

## 2019-12-08 MED ORDER — SIMETHICONE 180 MG PO CAPS: 180.0000 mg | ORAL_CAPSULE | Freq: Three times a day (TID) | ORAL | 3 refills | Status: DC | PRN

## 2019-12-08 MED ORDER — CYCLOBENZAPRINE HCL 5 MG PO TABS: ORAL_TABLET | ORAL | 3 refills | Status: DC

## 2019-12-08 MED FILL — CYCLOBENZAPRINE HCL 5 MG TA: 5 | 20 days supply | Qty: 60 | Fill #3

## 2019-12-08 MED FILL — SIMETHICONE 180 MG CAPS: 180 | 20 days supply | Qty: 60 | Fill #0

## 2019-12-08 NOTE — Assessment & Plan Note (Signed)
Hypertension: Well-controlled on multiple antihypertensives.  Initial blood pressure 158/73, pulse 61 Repeat blood pressure 130s/80s pulse of 75  Plan: -Continue triamterene-HCTZ 75-50 mg daily -Continue verapamil 240 mg daily -Continue losartan 50 mg daily

## 2019-12-08 NOTE — Assessment & Plan Note (Signed)
Insulin-dependent diabetes mellitus: Improving.  Last of A1c was 8.3%.  A1c today 7.8%.  His home blood glucose statistics are below.  Average 140 mg/dL, highest 217 mg/dL, lowest 64 mg/dL.  During my last visit, he was started on Jardiance 10 mg daily however he states that at times when he is working outside his p.o. intake is not optimal and he began experiencing hypoglycemia to the 60s on the full dose of Jardiance so he cut it in half to 5 mg daily  Plan: -Continue Tresiba 23 units -Continue Humalog 10 units qAM, 7 units lunch, 7 units dinner, 5 units midnight -Continue Victoza 1.8 mg daily -Continue Jardiance 10 mg daily (he cuts medication in half)

## 2019-12-08 NOTE — Patient Instructions (Signed)
Trevor Fischer,   Thanks for seeing [name] today. CONGRATULATIONS on the improvement with your diabetes!!!!  Keep up the good work.   Take care! Dr. Eileen Stanford  Please call the internal medicine center clinic if you have any questions or concerns, we may be able to help and keep you from a long and expensive emergency room wait. Our clinic and after hours phone number is [phone], the best time to call is Monday through Friday 9 am to 4 pm but there is always someone available 24/7 if you have an emergency. If you need medication refills please notify your pharmacy one week in advance and they will send [name] a request.

## 2019-12-08 NOTE — Assessment & Plan Note (Signed)
Incidental renal mass: Follow-up MRI of the abdomen revealed small renal cyst without evidence for malignancy or hydronephrosis

## 2019-12-08 NOTE — Progress Notes (Signed)
   CC: Follow-up hypertension, diabetes mellitus  HPI:  Trevor Fischer is a 59 y.o. community dwelling gentleman with medical history listed below presented to follow-up on chronic medical problems.  Please see problem based charting for further details.  Past Medical History:  Diagnosis Date  . Allergic rhinitis   . Degenerative joint disease of knee   . DVT (deep venous thrombosis) (HCC) RLE X 2   Ended anticoagulation in 2012  . Flatulence 05/13/2015  . GERD (gastroesophageal reflux disease)   . 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 HIV infection ([place])   . Hx MRSA infection   . Hyperlipidemia   . Hypertension   . Pulmonary embolism ([place]) 05/14/2017  . Superficial thrombophlebitis   . Type II diabetes mellitus ([place]) 9/09   Review of Systems:  As per HPI  Physical Exam:  Vitals:   12/08/19 1426 12/08/19 1528  BP: (!) 158/73 138/81  Pulse: 61 75  Temp: 98.1 F (36.7 C)   TempSrc: Oral   SpO2: 99%   Weight: 227 lb 9.6 oz (103.2 kg)   Height: 6\' 2"  (1.88 m)    Physical Exam  Constitutional: He is well-developed, well-nourished, and in no distress. No distress.  Cardiovascular: Normal rate, regular rhythm and normal heart sounds.  No murmur heard. Pulmonary/Chest: Effort normal and breath sounds normal. No respiratory distress. He has no wheezes.  Skin: He is not diaphoretic.  Psychiatric: Mood and affect normal.    Assessment & Plan:   See Encounters Tab for problem based charting.  Patient discussed with Dr. Evette Doffing

## 2019-12-09 ENCOUNTER — Telehealth: Payer: Self-pay | Admitting: *Deleted

## 2019-12-09 MED FILL — BACLOFEN 10 MG TABS: 10 | 30 days supply | Qty: 90 | Fill #0

## 2019-12-09 NOTE — Progress Notes (Signed)
Internal Medicine Clinic Attending  Case discussed with Dr. Agyei at the time of the visit.  We reviewed the resident's history and exam and pertinent patient test results.  I agree with the assessment, diagnosis, and plan of care documented in the resident's note.    

## 2019-12-09 NOTE — Telephone Encounter (Signed)
Thanks

## 2019-12-09 NOTE — Telephone Encounter (Addendum)
Stated Baclofen was not ready at the [facility]. Informed pt it was refilled and sent yesterday. I called the pharmacy and left verbal order for "Baclofen 10 mg Take 1 tab TID #90 x 0 RF. Also his partner, Sunnie Nielsen ( [birth year]/12/56) stated Lyrica was not ready. Informed it was not refilled yesterday only Flexeril and refill request was sent to his doctor this am.

## 2019-12-17 ENCOUNTER — Encounter: Payer: 59 | Admitting: Internal Medicine

## 2019-12-22 ENCOUNTER — Other Ambulatory Visit: Payer: Self-pay

## 2019-12-22 ENCOUNTER — Other Ambulatory Visit: Payer: Self-pay | Admitting: Infectious Disease

## 2019-12-22 ENCOUNTER — Encounter: Payer: Self-pay | Admitting: Infectious Disease

## 2019-12-22 ENCOUNTER — Other Ambulatory Visit: Payer: Self-pay | Admitting: Internal Medicine

## 2019-12-22 ENCOUNTER — Ambulatory Visit (INDEPENDENT_AMBULATORY_CARE_PROVIDER_SITE_OTHER): Payer: 59 | Admitting: Infectious Disease

## 2019-12-22 VITALS — BP 124/68 | HR 68 | Temp 97.9°F

## 2019-12-22 DIAGNOSIS — D6859 Other primary thrombophilia: Secondary | ICD-10-CM | POA: Diagnosis not present

## 2019-12-22 DIAGNOSIS — E1140 Type 2 diabetes mellitus with diabetic neuropathy, unspecified: Secondary | ICD-10-CM

## 2019-12-22 DIAGNOSIS — E881 Lipodystrophy, not elsewhere classified: Secondary | ICD-10-CM

## 2019-12-22 DIAGNOSIS — I10 Essential (primary) hypertension: Secondary | ICD-10-CM | POA: Diagnosis not present

## 2019-12-22 DIAGNOSIS — Z794 Long term (current) use of insulin: Secondary | ICD-10-CM

## 2019-12-22 DIAGNOSIS — B20 Human immunodeficiency virus [HIV] disease: Secondary | ICD-10-CM

## 2019-12-22 MED ORDER — BIKTARVY 50-200-25 MG PO TABS: 1.0000 | ORAL_TABLET | Freq: Every day | ORAL | 11 refills | Status: DC

## 2019-12-22 MED FILL — VERAPAMIL ER 240 MG TABLET: 240 | 90 days supply | Qty: 90 | Fill #0
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
## 2019-12-24 MED FILL — UNIFINE PENTIPS 32GX5/32": 32G X 4 MM | 30 days supply | Qty: 200 | Fill #7

## 2019-12-24 MED FILL — UNIFINE PENTIPS 32GX5/32: 32G X 4 MM | 30 days supply | Qty: 200 | Fill #7

## 2020-01-05 ENCOUNTER — Other Ambulatory Visit: Payer: Self-pay | Admitting: Internal Medicine

## 2020-01-05 DIAGNOSIS — E1140 Type 2 diabetes mellitus with diabetic neuropathy, unspecified: Secondary | ICD-10-CM

## 2020-01-05 DIAGNOSIS — Z794 Long term (current) use of insulin: Secondary | ICD-10-CM

## 2020-01-05 MED FILL — PANTOPRAZOLE SOD DR 40 MG T: 40 | 30 days supply | Qty: 30 | Fill #3

## 2020-01-05 MED FILL — FREESTYLE LANCETS: 25 days supply | Qty: 100 | Fill #1

## 2020-01-05 MED FILL — TRESIBA FLEXTOUCH 100 UNITS: 100 | 90 days supply | Qty: 21 | Fill #3

## 2020-01-05 MED FILL — VICTOZA 18 MG/3 ML INJECT P: 18 | 30 days supply | Qty: 9 | Fill #1

## 2020-01-06 ENCOUNTER — Other Ambulatory Visit: Payer: Self-pay | Admitting: Internal Medicine

## 2020-01-06 DIAGNOSIS — Z794 Long term (current) use of insulin: Secondary | ICD-10-CM

## 2020-01-06 DIAGNOSIS — E1140 Type 2 diabetes mellitus with diabetic neuropathy, unspecified: Secondary | ICD-10-CM

## 2020-01-06 MED FILL — ATORVASTATIN 40 MG TABLET: 40 | 90 days supply | Qty: 90 | Fill #0

## 2020-01-09 MED FILL — BIKTARVY 50-200-25 MG TABS: 50-200-25 | 30 days supply | Qty: 30 | Fill #0

## 2020-01-16 ENCOUNTER — Encounter: Payer: Self-pay | Admitting: *Deleted

## 2020-01-16 ENCOUNTER — Other Ambulatory Visit: Payer: Self-pay | Admitting: Internal Medicine

## 2020-01-16 DIAGNOSIS — M5416 Radiculopathy, lumbar region: Secondary | ICD-10-CM

## 2020-01-16 DIAGNOSIS — Z794 Long term (current) use of insulin: Secondary | ICD-10-CM

## 2020-01-16 DIAGNOSIS — G8929 Other chronic pain: Secondary | ICD-10-CM

## 2020-01-16 DIAGNOSIS — M5441 Lumbago with sciatica, right side: Secondary | ICD-10-CM

## 2020-01-16 DIAGNOSIS — E1140 Type 2 diabetes mellitus with diabetic neuropathy, unspecified: Secondary | ICD-10-CM

## 2020-01-16 MED FILL — CYCLOBENZAPRINE HCL 5 MG TA: 5 | 20 days supply | Qty: 60 | Fill #0

## 2020-01-17 MED FILL — JARDIANCE 10 MG TABLET: 10 | 90 days supply | Qty: 90 | Fill #0

## 2020-01-17 MED FILL — BACLOFEN 10 MG TABS: 10 | 30 days supply | Qty: 90 | Fill #0

## 2020-01-19 MED FILL — GABAPENTIN 800 MG TABLET: 800 | 90 days supply | Qty: 270 | Fill #1

## 2020-02-01 IMAGING — MR MR LUMBAR SPINE W/O CM
4 of 5 series · 25 of 48 positions shown · non-contrast
Comparison: Radiography 10/10/2017.

CLINICAL DATA: Low back pain over the last 2 weeks. Pain and
numbness radiating to the right foot.

EXAM:
MRI LUMBAR SPINE WITHOUT CONTRAST
TECHNIQUE: Multiplanar, multisequence MR imaging of the lumbar spine was
performed. No intravenous contrast was administered.

[Series 3: T1 · sagittal · 4.0mm · 0.51mm/px · 5 of 14 slices shown (1 of 2)]
[im 1/14]
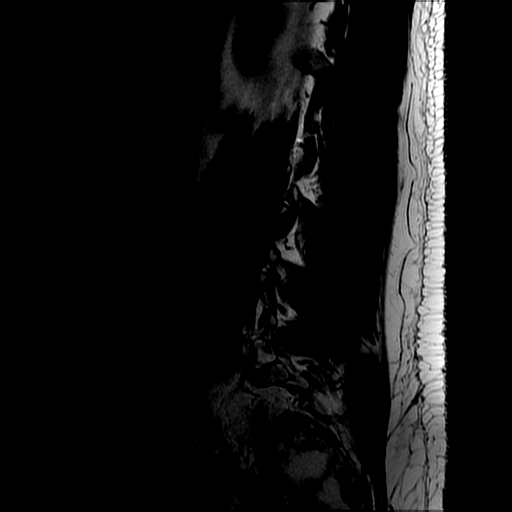
[im 4/14]
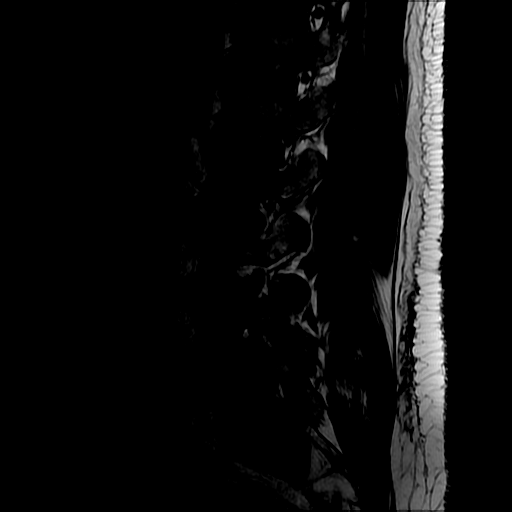
[im 7/14]
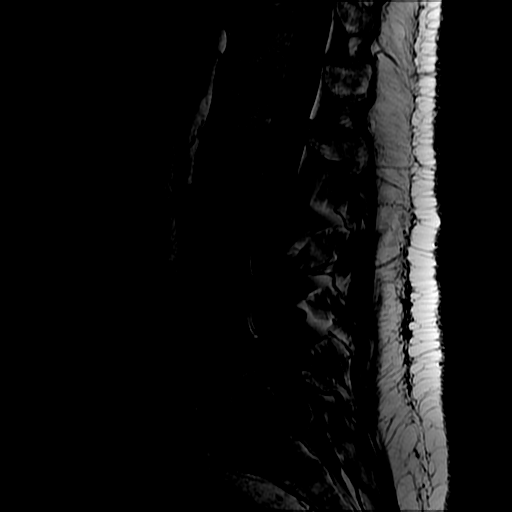
[im 10/14]
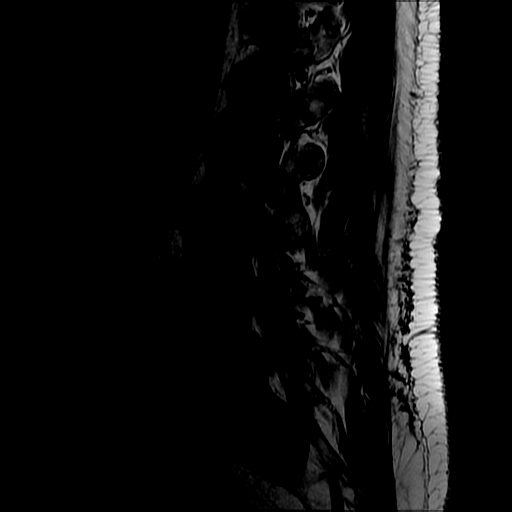
[im 14/14]
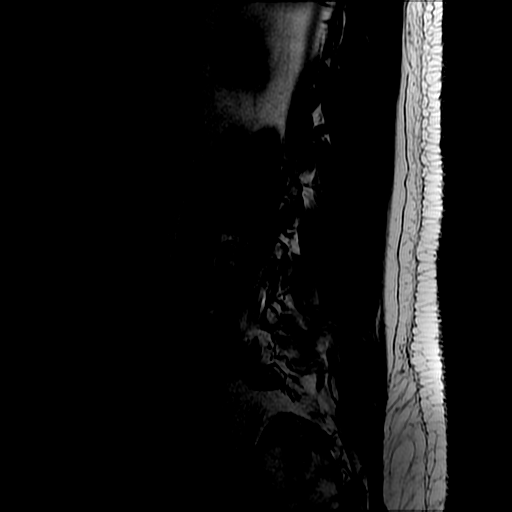

[Series 4: T2 post-contrast · sagittal · 4.0mm · 0.51mm/px · 5 of 14 slices shown]
[im 1/14]
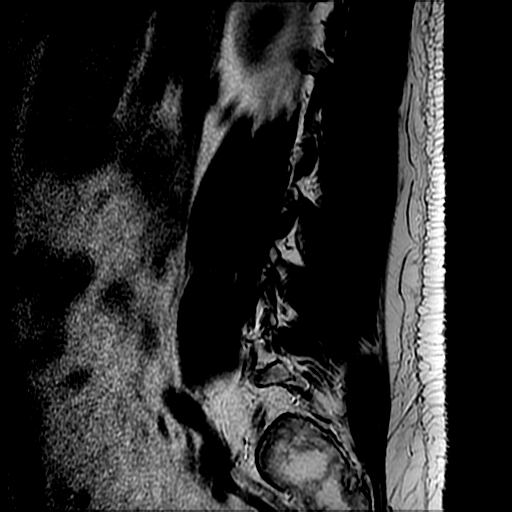
[im 4/14]
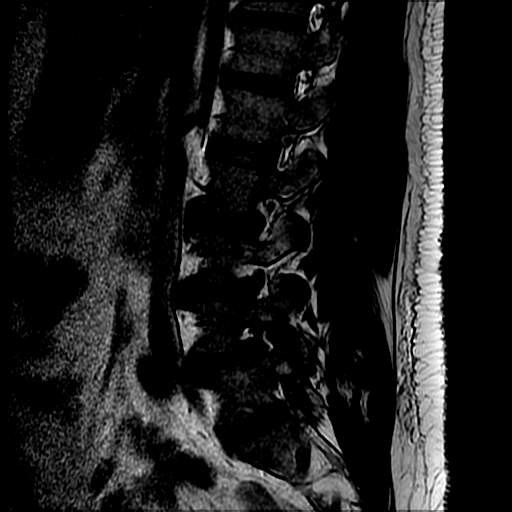
[im 7/14]
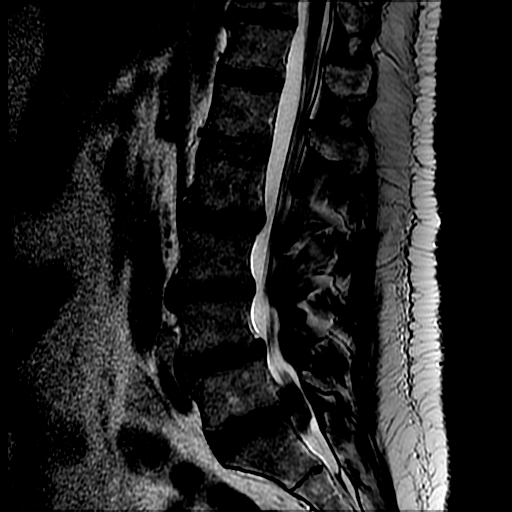
[im 10/14]
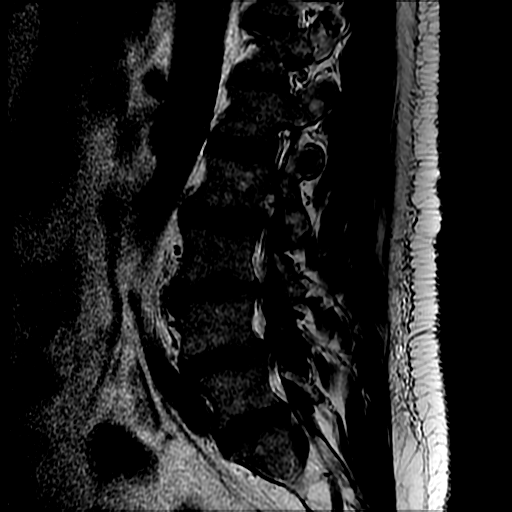
[im 14/14]
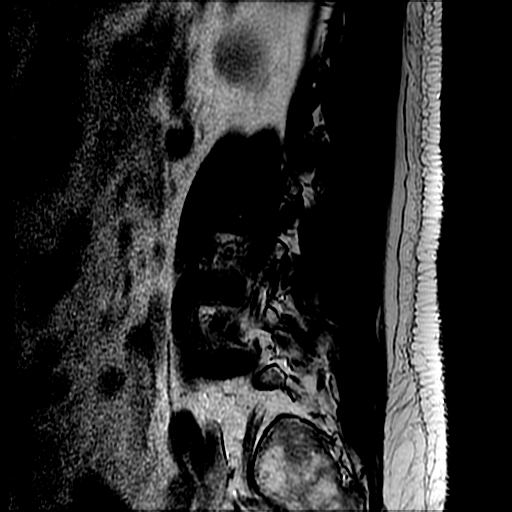

[Series 6: T2 · axial · 4.0mm · 0.82mm/px · z∈[-96,+119]mm · 10 of 40 slices shown]
[im 3/40]
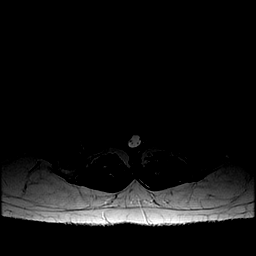
[im 6/40]
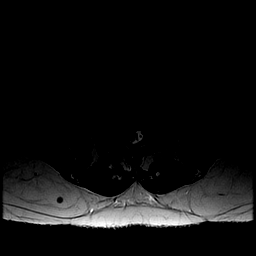
[im 8/40]
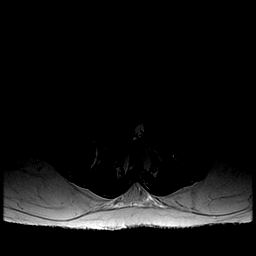
[im 14/40]
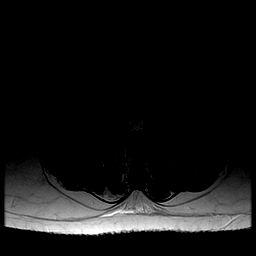
[im 19/40]
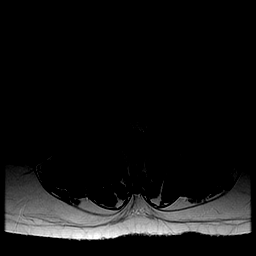
[im 21/40]
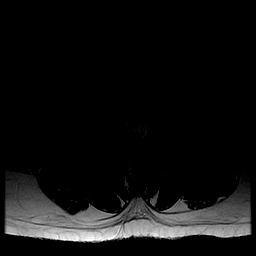
[im 24/40]
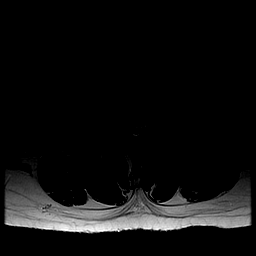
[im 29/40]
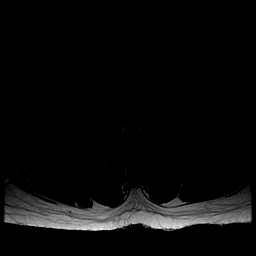
[im 34/40]
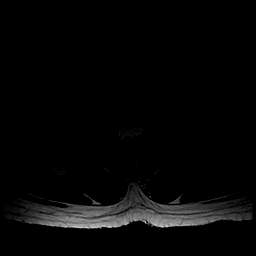
[im 40/40]
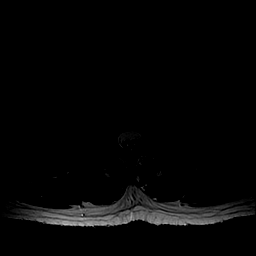

[Series 7: T1 · axial · 4.0mm · 0.41mm/px · z∈[-96,+89]mm · 5 of 40 slices shown (2 of 2)]
[im 3/40]
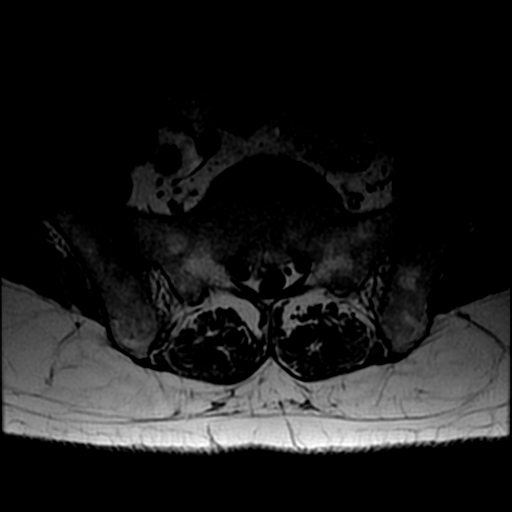
[im 6/40]
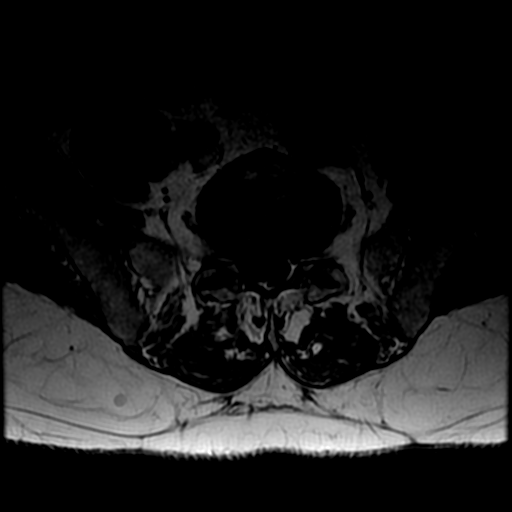
[im 8/40]
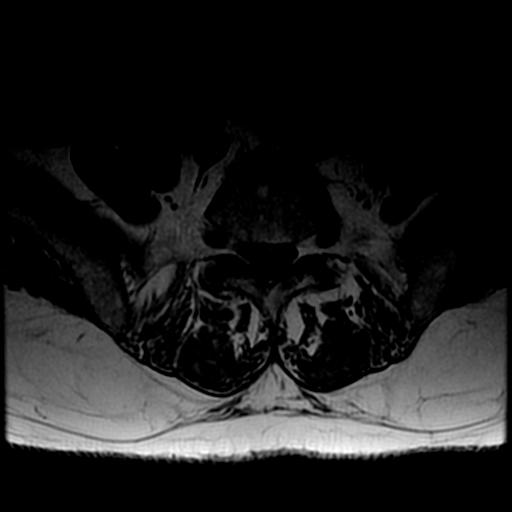
[im 21/40]
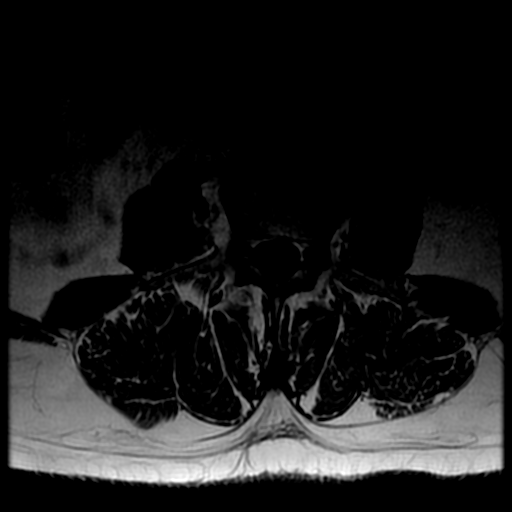
[im 34/40]
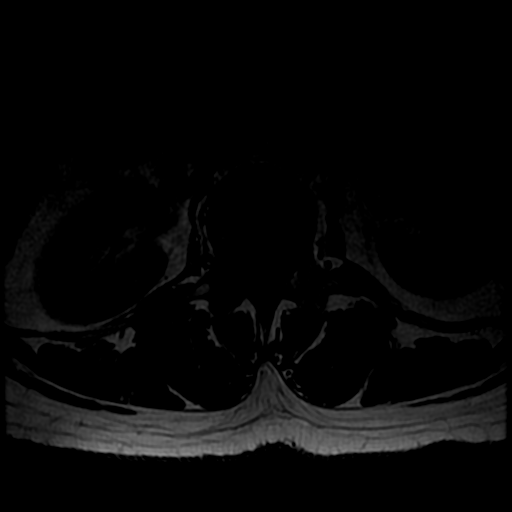

[25 of 48 positions shown; findings below may reference images not displayed]

FINDINGS: Segmentation:  5 lumbar type vertebral bodies.

Alignment:  2 mm anterolisthesis L4-5.

Vertebrae:  No fracture or primary bone lesion.

Conus medullaris and cauda equina: Conus extends to the L1 level.
Conus and cauda equina appear normal.

Paraspinal and other soft tissues: Renal cysts, not completely
evaluated.

Disc levels:

No abnormality at T12-L1 or L1-2.

L2-3: Bulging of the disc more prominent towards the right. Mild
narrowing of the right lateral recess without visible neural
compression.

L3-4: Bulging of the disc. Mild narrowing of the lateral recesses
and neural foramina. Contact of the right L3 nerve root by bulging
disc material could focally irritate that structure.

L4-5: Bilateral facet arthropathy with 2 mm of anterolisthesis.
Bulging of the disc. Narrowing of the lateral recesses and neural
foramina with some potential for neural compression or irritation.

L5-S1: Very large right posterolateral disc herniation with extruded
fragment showing migration upward and downward. Right-sided nerve
compression quite likely. No foraminal extension of disc material.
IMPRESSION: Very large right posterolateral disc herniation at L5-S1 with
extruded disc material showing upward and downward migration, quite
likely to cause right-sided neural compression.

L4-5 facet arthropathy with 2 mm of anterolisthesis. Bulging of the
disc. Narrowing of the lateral recesses and neural foramina that
could possibly be symptomatic.

L3-4 disc bulge with mild lateral recess narrowing. Bulging disc
material contacts the right foraminal to extraforaminal L3 nerve and
could possibly irritate that structure.

## 2020-02-02 ENCOUNTER — Other Ambulatory Visit: Payer: Self-pay | Admitting: Internal Medicine

## 2020-02-02 ENCOUNTER — Other Ambulatory Visit: Payer: Self-pay | Admitting: Infectious Disease

## 2020-02-02 DIAGNOSIS — E1140 Type 2 diabetes mellitus with diabetic neuropathy, unspecified: Secondary | ICD-10-CM

## 2020-02-02 DIAGNOSIS — Z794 Long term (current) use of insulin: Secondary | ICD-10-CM

## 2020-02-02 MED FILL — PANTOPRAZOLE SOD DR 40 MG T: 40 | 30 days supply | Qty: 30 | Fill #0

## 2020-02-02 MED FILL — BIKTARVY 50-200-25 MG TABS: 50-200-25 | 30 days supply | Qty: 30 | Fill #1

## 2020-02-02 MED FILL — ELIQUIS 5 MG TABLET: 5 | 90 days supply | Qty: 180 | Fill #2

## 2020-02-02 NOTE — Telephone Encounter (Signed)
Next Appt With Internal Medicine Jean Rosenthal, MD) 03/18/2020 at 2:15 PM

## 2020-02-03 ENCOUNTER — Other Ambulatory Visit: Payer: Self-pay | Admitting: Internal Medicine

## 2020-02-03 DIAGNOSIS — E1140 Type 2 diabetes mellitus with diabetic neuropathy, unspecified: Secondary | ICD-10-CM

## 2020-02-03 DIAGNOSIS — Z794 Long term (current) use of insulin: Secondary | ICD-10-CM

## 2020-02-03 MED FILL — LOSARTAN POTASSIUM 50 MG TA: 50 | 90 days supply | Qty: 90 | Fill #0

## 2020-02-03 MED FILL — VICTOZA 18 MG/3 ML INJECT P: 18 | 30 days supply | Qty: 9 | Fill #0

## 2020-02-03 MED FILL — FREESTYLE LITE TEST STRIP: 25 days supply | Qty: 100 | Fill #1

## 2020-02-03 MED FILL — FREESTYLE LANCETS: 25 days supply | Qty: 100 | Fill #2

## 2020-02-04 ENCOUNTER — Other Ambulatory Visit: Payer: Self-pay | Admitting: Internal Medicine

## 2020-02-05 MED FILL — UNIFINE PENTIPS 32GX5/32: 32G X 4 MM | 40 days supply | Qty: 200 | Fill #0

## 2020-02-09 ENCOUNTER — Other Ambulatory Visit: Payer: Self-pay | Admitting: Internal Medicine

## 2020-02-09 DIAGNOSIS — I10 Essential (primary) hypertension: Secondary | ICD-10-CM

## 2020-02-10 ENCOUNTER — Ambulatory Visit: Payer: 59 | Admitting: Podiatry

## 2020-02-10 ENCOUNTER — Other Ambulatory Visit: Payer: Self-pay

## 2020-02-10 ENCOUNTER — Telehealth: Payer: Self-pay | Admitting: *Deleted

## 2020-02-10 DIAGNOSIS — I872 Venous insufficiency (chronic) (peripheral): Secondary | ICD-10-CM | POA: Diagnosis not present

## 2020-02-10 DIAGNOSIS — M79675 Pain in left toe(s): Secondary | ICD-10-CM | POA: Diagnosis not present

## 2020-02-10 DIAGNOSIS — Z7901 Long term (current) use of anticoagulants: Secondary | ICD-10-CM

## 2020-02-10 DIAGNOSIS — E1165 Type 2 diabetes mellitus with hyperglycemia: Secondary | ICD-10-CM | POA: Diagnosis not present

## 2020-02-10 DIAGNOSIS — B351 Tinea unguium: Secondary | ICD-10-CM

## 2020-02-10 DIAGNOSIS — M79674 Pain in right toe(s): Secondary | ICD-10-CM

## 2020-02-10 NOTE — Patient Instructions (Signed)
Diabetes Mellitus and Foot Care Foot care is an important part of your health, especially when you have diabetes. Diabetes may cause you to have problems because of poor blood flow (circulation) to your feet and legs, which can cause your skin to:  Become thinner and drier.  Break more easily.  Heal more slowly.  Peel and crack. You may also have nerve damage (neuropathy) in your legs and feet, causing decreased feeling in them. This means that you may not notice minor injuries to your feet that could lead to more serious problems. Noticing and addressing any potential problems early is the best way to prevent future foot problems. How to care for your feet Foot hygiene  Wash your feet daily with warm water and mild soap. Do not use hot water. Then, pat your feet and the areas between your toes until they are completely dry. Do not soak your feet as this can dry your skin.  Trim your toenails straight across. Do not dig under them or around the cuticle. File the edges of your nails with an emery board or nail file.  Apply a moisturizing lotion or petroleum jelly to the skin on your feet and to dry, brittle toenails. Use lotion that does not contain alcohol and is unscented. Do not apply lotion between your toes. Shoes and socks  Wear clean socks or stockings every day. Make sure they are not too tight. Do not wear knee-high stockings since they may decrease blood flow to your legs.  Wear shoes that fit properly and have enough cushioning. Always look in your shoes before you put them on to be sure there are no objects inside.  To break in new shoes, wear them for just a few hours a day. This prevents injuries on your feet. Wounds, scrapes, corns, and calluses  Check your feet daily for blisters, cuts, bruises, sores, and redness. If you cannot see the bottom of your feet, use a mirror or ask someone for help.  Do not cut corns or calluses or try to remove them with medicine.  If you  find a minor scrape, cut, or break in the skin on your feet, keep it and the skin around it clean and dry. You may clean these areas with mild soap and water. Do not clean the area with peroxide, alcohol, or iodine.  If you have a wound, scrape, corn, or callus on your foot, look at it several times a day to make sure it is healing and not infected. Check for: ? Redness, swelling, or pain. ? Fluid or blood. ? Warmth. ? Pus or a bad smell. General instructions  Do not cross your legs. This may decrease blood flow to your feet.  Do not use heating pads or hot water bottles on your feet. They may burn your skin. If you have lost feeling in your feet or legs, you may not know this is happening until it is too late.  Protect your feet from hot and cold by wearing shoes, such as at the beach or on hot pavement.  Schedule a complete foot exam at least once a year (annually) or more often if you have foot problems. If you have foot problems, report any cuts, sores, or bruises to your health care provider immediately. Contact a health care provider if:  You have a medical condition that increases your risk of infection and you have any cuts, sores, or bruises on your feet.  You have an injury that is not   healing.  You have redness on your legs or feet.  You feel burning or tingling in your legs or feet.  You have pain or cramps in your legs and feet.  Your legs or feet are numb.  Your feet always feel cold.  You have pain around a toenail. Get help right away if:  You have a wound, scrape, corn, or callus on your foot and: ? You have pain, swelling, or redness that gets worse. ? You have fluid or blood coming from the wound, scrape, corn, or callus. ? Your wound, scrape, corn, or callus feels warm to the touch. ? You have pus or a bad smell coming from the wound, scrape, corn, or callus. ? You have a fever. ? You have a red line going up your leg. Summary  Check your feet every day  for cuts, sores, red spots, swelling, and blisters.  Moisturize feet and legs daily.  Wear shoes that fit properly and have enough cushioning.  If you have foot problems, report any cuts, sores, or bruises to your health care provider immediately.  Schedule a complete foot exam at least once a year (annually) or more often if you have foot problems. This information is not intended to replace advice given to you by your health care provider. Make sure you discuss any questions you have with your health care provider. Document Revised: 06/11/2019 Document Reviewed: 10/20/2016 Elsevier Patient Education  2020 [employer]  Chronic Venous Insufficiency Chronic venous insufficiency is a condition where the leg veins cannot effectively pump blood from the legs to the heart. This happens when the vein walls are either stretched, weakened, or damaged, or when the valves inside the vein are damaged. With the right treatment, you should be able to continue with an active life. This condition is also called venous stasis. What are the causes? Common causes of this condition include:  High blood pressure inside the veins (venous hypertension).  Sitting or standing too long, causing increased blood pressure in the leg veins.  A blood clot that blocks blood flow in a vein (deep vein thrombosis, DVT).  Inflammation of a vein (phlebitis) that causes a blood clot to form.  Tumors in the pelvis that cause blood to back up. What increases the risk? The following factors may make you more likely to develop this condition:  Having a family history of this condition.  Obesity.  Pregnancy.  Living without enough regular physical activity or exercise (sedentary lifestyle).  Smoking.  Having a job that requires long periods of standing or sitting in one place.  Being a certain age. Women in their 40s and 50s and men in their 70s are more likely to develop this condition. What are the signs or  symptoms? Symptoms of this condition include:  Veins that are enlarged, bulging, or twisted (varicose veins).  Skin breakdown or ulcers.  Reddened skin or dark discoloration of skin on the leg between the knee and ankle.  Brown, smooth, tight, and painful skin just above the ankle, usually on the inside of the leg (lipodermatosclerosis).  Swelling of the legs. How is this diagnosed? This condition may be diagnosed based on:  Your medical history.  A physical exam.  Tests, such as: ? A procedure that creates an image of a blood vessel and nearby organs and provides information about blood flow through the blood vessel (duplex ultrasound). ? A procedure that tests blood flow (plethysmography). ? A procedure that looks at the veins using X-ray   and dye (venogram). How is this treated? The goals of treatment are to help you return to an active life and to minimize pain or disability. Treatment depends on the severity of your condition, and it may include:  Wearing compression stockings. These can help relieve symptoms and help prevent your condition from getting worse. However, they do not cure the condition.  Sclerotherapy. This procedure involves an injection of a solution that shrinks damaged veins.  Surgery. This may involve: ? Removing a diseased vein (vein stripping). ? Cutting off blood flow through the vein (laser ablation surgery). ? Repairing or reconstructing a valve within the affected vein. Follow these instructions at home:      Wear compression stockings as told by your health care provider. These stockings help to prevent blood clots and reduce swelling in your legs.  Take over-the-counter and prescription medicines only as told by your health care provider.  Stay active by exercising, walking, or doing different activities. Ask your health care provider what activities are safe for you and how much exercise you need.  Drink enough fluid to keep your urine pale  yellow.  Do not use any products that contain nicotine or tobacco, such as cigarettes, e-cigarettes, and chewing tobacco. If you need help quitting, ask your health care provider.  Keep all follow-up visits as told by your health care provider. This is important. Contact a health care provider if you:  Have redness, swelling, or more pain in the affected area.  See a red streak or line that goes up or down from the affected area.  Have skin breakdown or skin loss in the affected area, even if the breakdown is small.  Get an injury in the affected area. Get help right away if:  You get an injury and an open wound in the affected area.  You have: ? Severe pain that does not get better with medicine. ? Sudden numbness or weakness in the foot or ankle below the affected area. ? Trouble moving your foot or ankle. ? A fever. ? Worse or persistent symptoms. ? Chest pain. ? Shortness of breath. Summary  Chronic venous insufficiency is a condition where the leg veins cannot effectively pump blood from the legs to the heart.  Chronic venous insufficiency occurs when the vein walls become stretched, weakened, or damaged, or when valves within the vein are damaged.  Treatment depends on how severe your condition is. It often involves wearing compression stockings and may involve having a procedure.  Make sure you stay active by exercising, walking, or doing different activities. Ask your health care provider what activities are safe for you and how much exercise you need. This information is not intended to replace advice given to you by your health care provider. Make sure you discuss any questions you have with your health care provider. Document Revised: 06/11/2018 Document Reviewed: 06/11/2018 Elsevier Patient Education  2020 [employer]  

## 2020-02-10 NOTE — Telephone Encounter (Signed)
-----   Message from Trula Slade, DPM sent at 02/10/2020 10:11 AM EDT ----- Can you please refer to [facility] for venous insuffiencey?

## 2020-02-10 NOTE — Telephone Encounter (Signed)
Prepared required referral form, available clinicals for the last 6 months and demographics to be faxed to [facility] once 02/10/2020 clinicals are available.

## 2020-02-11 NOTE — Progress Notes (Signed)
Subjective: 58 year old male presents the office today for diabetic foot exam as well as for thick elongated toenails that he cannot trim himself. Denies any open sores. No claudication symptoms.  He does have swelling to his legs and states that he had a history of ablations on both legs for his veins.  His last A1c was 8.3 on September 17, 2019  He is on Eliquis.  On gabapentin for neuropathy  Denies any systemic complaints such as fevers, chills, nausea, vomiting. No acute changes since last appointment, and no other complaints at this time.   Objective: AAO x3, NAD DP/PT pulses palpable bilaterally, CRT less than 3 seconds; chronic swelling present bilaterally with hypermobility changes to the distal legs. Hyperkeratotic tissue to the right hallux.  No underlying ulceration identified today.  There is no drainage or pus or any open sores. Nails are hypertrophic, dystrophic, brittle, discolored, elongated 10. No surrounding redness or drainage. Tenderness nails 1-5 bilaterally. No open lesions or pre-ulcerative lesions are identified today. No open lesions or pre-ulcerative lesions.  No pain with calf compression, swelling, warmth, erythema  Assessment: 59 year old male with hyperkeratotic lesion, symptomatic onychomycosis; likely venous insufficiency  Plan: -All treatment options discussed with the patient including all alternatives, risks, complications.  -Hyperkeratotic lesion sharply debrided without any complications or bleeding.  Recommend moisturizer daily.  Discussed the miracle foot cream. -Nails debrided x10 without any complications or bleeding -Refer to vein specialist for likely venous insufficiency as the swelling appears to be worsening to his legs.  He has no other systemic symptoms including chest pain or shortness of breath. -Discussed the importance of foot inspection. -Patient encouraged to call the office with any questions, concerns, change in symptoms.   Return  in about 3 months   Trula Slade DPM

## 2020-02-20 NOTE — Telephone Encounter (Signed)
That note was already completed. Thanks.

## 2020-02-23 MED FILL — HUMALOG 100 UNITS/ML KWIKPE: 100 | 45 days supply | Qty: 18 | Fill #5

## 2020-03-02 ENCOUNTER — Other Ambulatory Visit: Payer: Self-pay | Admitting: Internal Medicine

## 2020-03-02 DIAGNOSIS — G8929 Other chronic pain: Secondary | ICD-10-CM

## 2020-03-02 MED FILL — VICTOZA 18 MG/3 ML INJECT P: 18 | 30 days supply | Qty: 9 | Fill #1

## 2020-03-02 MED FILL — PANTOPRAZOLE SOD DR 40 MG T: 40 | 30 days supply | Qty: 30 | Fill #1

## 2020-03-03 MED FILL — BACLOFEN 10 MG TABS: 10 | 30 days supply | Qty: 90 | Fill #0

## 2020-03-15 MED FILL — BIKTARVY 50-200-25 MG TABS: 50-200-25 | 30 days supply | Qty: 30 | Fill #2

## 2020-03-17 ENCOUNTER — Encounter: Payer: 59 | Admitting: Internal Medicine

## 2020-03-17 MED FILL — SM ALCOHOL 70% PREP PADS: 70 | 60 days supply | Qty: 600 | Fill #1

## 2020-03-17 MED FILL — UNIFINE PENTIPS 32GX5/32: 32G X 4 MM | 40 days supply | Qty: 200 | Fill #1

## 2020-03-18 ENCOUNTER — Other Ambulatory Visit: Payer: Self-pay

## 2020-03-18 ENCOUNTER — Ambulatory Visit (INDEPENDENT_AMBULATORY_CARE_PROVIDER_SITE_OTHER): Payer: 59 | Admitting: Internal Medicine

## 2020-03-18 ENCOUNTER — Encounter: Payer: Self-pay | Admitting: Internal Medicine

## 2020-03-18 VITALS — BP 124/74 | HR 74 | Temp 98.7°F | Ht 74.0 in | Wt 226.4 lb

## 2020-03-18 DIAGNOSIS — E1140 Type 2 diabetes mellitus with diabetic neuropathy, unspecified: Secondary | ICD-10-CM | POA: Diagnosis not present

## 2020-03-18 DIAGNOSIS — E0840 Diabetes mellitus due to underlying condition with diabetic neuropathy, unspecified: Secondary | ICD-10-CM | POA: Diagnosis not present

## 2020-03-18 DIAGNOSIS — Z794 Long term (current) use of insulin: Secondary | ICD-10-CM | POA: Diagnosis not present

## 2020-03-18 DIAGNOSIS — I10 Essential (primary) hypertension: Secondary | ICD-10-CM

## 2020-03-18 DIAGNOSIS — R143 Flatulence: Secondary | ICD-10-CM | POA: Diagnosis not present

## 2020-03-18 LAB — POCT GLYCOSYLATED HEMOGLOBIN (HGB A1C): Hemoglobin A1C: 8.2 % — AB (ref 4.0–5.6)

## 2020-03-18 LAB — GLUCOSE, CAPILLARY: Glucose-Capillary: 87 mg/dL (ref 70–99)

## 2020-03-18 NOTE — Assessment & Plan Note (Signed)
Type 2 diabetes mellitus: Uncontrolled.  A1c today of 8.2%<<7.8%<<8.3%.  On careful evaluation of his chronic glucose monitoring.  It seems that he has episodes of hypoglycemia in the afternoons to the 60s.  Due to his work schedule and the fact that he works at night, he has not no consistent dietary pattern and it has been challenging controlling his diabetes.  Plan: -Continue Tresiba 23 units daily -Change  Humalog 9 units qAM, 8 units lunch, 8 units dinner, 5 units midnight -Continue Victoza 1.8 mg daily -Continue Jardiance 10 mg daily (he cuts medication in half)

## 2020-03-18 NOTE — Assessment & Plan Note (Signed)
Flatulence: He states that he has been suffering from flatulence for a while however recently has been worsening and occurs throughout the day.  He does not correlate this to any specific diet and his symptoms have not improved with simethicone.  He does mention that he eat a lot of dairy products.  He denies abdominal pain, nausea, vomiting, diarrhea or constipation.  Plan: -Continue simethicone -Dietary modification.  I gave him a printout of foods to avoid

## 2020-03-18 NOTE — Progress Notes (Signed)
   CC: Follow-up diabetes mellitus  HPI:  [name] is a 59 y.o. pleasant African-American gentleman with medical history listed below presenting to follow-up on chronic medical problems.  Please see problem based charting for further details.  Past Medical History:  Diagnosis Date  . Allergic rhinitis   . Degenerative joint disease of knee   . DVT (deep venous thrombosis) ([facility]) RLE X 2   Ended anticoagulation in 2012  . Flatulence 05/13/2015  . GERD (gastroesophageal reflux disease)   . 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 HIV infection ([place])   . Hx MRSA infection   . Hyperlipidemia   . Hypertension   . Pulmonary embolism ([place]) 05/14/2017  . Superficial thrombophlebitis   . Type II diabetes mellitus ([place]) 9/09   Review of Systems:  As per HPI  Physical Exam:  Vitals:   03/18/20 1447  BP: 124/74  Pulse: 74  Temp: 98.7 F (37.1 C)  TempSrc: Oral  SpO2: 98%  Weight: 226 lb 6.4 oz (102.7 kg)  Height: 6\' 2"  (1.88 m)   Physical Exam Cardiovascular:     Rate and Rhythm: Normal rate.     Heart sounds: No murmur heard.   Pulmonary:     Effort: No respiratory distress.     Breath sounds: Normal breath sounds.  Abdominal:     General: Abdomen is flat. There is no distension.  Neurological:     Mental Status: He is alert.  Psychiatric:        Mood and Affect: Mood normal.     Assessment & Plan:   See Encounters Tab for problem based charting.  Patient discussed with Dr. Evette Doffing

## 2020-03-18 NOTE — Patient Instructions (Signed)
Mr. Dethloff,   It was a pleasure taking care of you here in the clinic today.  Here my recommendations after today's visit.  1.  For your diabetes, please continue all your medications.  Regarding your Humalog here are excellent adjustments I want you to follow.  Instead of taking 10--7-7-5, I want you to take 9-8-8-5 units.  2.  As we discussed, I would like for you to cut back on the dairy products which will help with the gas.  Please follow-up in 3 months.  Dr. Eileen Stanford  Please call the internal medicine center clinic if you have any questions or concerns, we may be able to help and keep you from a long and expensive emergency room wait. Our clinic and after hours phone number is [phone], the best time to call is Monday through Friday 9 am to 4 pm but there is always someone available 24/7 if you have an emergency. If you need medication refills please notify your pharmacy one week in advance and they will send [name] a request.

## 2020-03-18 NOTE — Assessment & Plan Note (Signed)
Hypertension: Well-controlled.  BP Readings from Last 3 Encounters:  03/18/20 124/74  12/22/19 124/68  12/08/19 138/81   Plan: -Continue triamterene-HCTZ 75-50 mg daily -Continue verapamil 240 mg daily -Continue losartan 50 mg daily

## 2020-03-19 LAB — MICROALBUMIN / CREATININE URINE RATIO
Creatinine, Urine: 88 mg/dL
Microalb/Creat Ratio: 15 mg/g creat (ref 0–29)
Microalbumin, Urine: 13.6 ug/mL

## 2020-03-19 NOTE — Progress Notes (Signed)
Internal Medicine Clinic Attending  Case discussed with Dr. Agyei at the time of the visit.  We reviewed the resident's history and exam and pertinent patient test results.  I agree with the assessment, diagnosis, and plan of care documented in the resident's note.    

## 2020-03-22 ENCOUNTER — Other Ambulatory Visit: Payer: Self-pay | Admitting: *Deleted

## 2020-03-22 DIAGNOSIS — I872 Venous insufficiency (chronic) (peripheral): Secondary | ICD-10-CM

## 2020-03-29 ENCOUNTER — Other Ambulatory Visit: Payer: Self-pay | Admitting: Internal Medicine

## 2020-03-29 DIAGNOSIS — E1140 Type 2 diabetes mellitus with diabetic neuropathy, unspecified: Secondary | ICD-10-CM

## 2020-03-29 MED FILL — TRESIBA FLEXTOUCH 100 UNITS: 100 | 90 days supply | Qty: 21 | Fill #0

## 2020-03-29 MED FILL — ATORVASTATIN 40 MG TABLET: 40 | 90 days supply | Qty: 90 | Fill #1

## 2020-03-29 MED FILL — PANTOPRAZOLE SOD DR 40 MG T: 40 | 30 days supply | Qty: 30 | Fill #2

## 2020-04-01 ENCOUNTER — Encounter: Payer: Self-pay | Admitting: Vascular Surgery

## 2020-04-01 ENCOUNTER — Other Ambulatory Visit: Payer: Self-pay

## 2020-04-01 ENCOUNTER — Ambulatory Visit: Payer: 59 | Admitting: Vascular Surgery

## 2020-04-01 ENCOUNTER — Ambulatory Visit (HOSPITAL_COMMUNITY)
Admission: RE | Admit: 2020-04-01 | Discharge: 2020-04-01 | Disposition: A | Discharge status: Home / Self Care | Payer: 59 | Source: Ambulatory Visit | Attending: Vascular Surgery | Admitting: Vascular Surgery

## 2020-04-01 VITALS — BP 134/82 | HR 52 | Temp 97.5°F | Resp 18 | Ht 74.5 in | Wt 219.2 lb

## 2020-04-01 DIAGNOSIS — I872 Venous insufficiency (chronic) (peripheral): Secondary | ICD-10-CM | POA: Diagnosis not present

## 2020-04-01 NOTE — Progress Notes (Signed)
REASON FOR CONSULT:    Chronic venous insufficiency.  The consult is requested by Dr. Celesta Gentile.   ASSESSMENT & PLAN:   CHRONIC VENOUS INSUFFICIENCY: This patient has CEAP C4b venous disease.  He has significant deep venous reflux and also superficial venous reflux involving the small saphenous vein.  His right great saphenous vein has been previously ablated.  We have discussed the importance of intermittent leg elevation the proper positioning for this.  In addition we have fitted him for thigh-high compression stockings with a gradient of 20 to 30 mmHg.  I have encouraged him to avoid prolonged sitting and standing.  We have discussed the importance of exercise specifically walking and water aerobics.  We have also discussed the importance of maintaining a healthy weight given the central obesity especially increases lower extremity venous pressure.  If his symptoms progress, I think he would be a candidate for endovenous laser ablation of the right small saphenous vein.  We would likely try to cannulate up into the thigh into the vein of Giacomini.  Deitra Mayo, MD Office: [phone]   HPI:   Trevor Fischer is a pleasant 59 y.o. male, who was referred for evaluation of chronic venous insufficiency.  The patient describes a long history of bilateral lower extremity swelling which is typically worse on the right side.  He has had three previous DVTs in the past and for this reason is on Eliquis.  He states that he is to remain on this indefinitely.  He describes aching pain and heaviness in both lower extremities which is aggravated by standing and sitting and relieved somewhat with elevation.  His symptoms are worse at the end of the day.  He has been wearing thigh-high compression stockings with a gradient of 15 to 20 mmHg.  He has not had any previous ulcerations on his legs.  He has undergone laser ablation of the right great saphenous vein by Dr. Kellie Simmering in the past.    His work requires him to stand for long hours.  Past Medical History:  Diagnosis Date  . Allergic rhinitis   . Degenerative joint disease of knee   . DVT (deep venous thrombosis) (HCC) RLE X 2   Ended anticoagulation in 2012  . Flatulence 05/13/2015  . GERD (gastroesophageal reflux disease)   . 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 HIV infection ([place])   . Hx MRSA infection   . Hyperlipidemia   . Hypertension   . Pulmonary embolism ([place]) 05/14/2017  . Superficial thrombophlebitis   . Type II diabetes mellitus ([place]) 9/09    Family History  Problem Relation Age of Onset  . Diabetes Father   . Kidney disease Father   . Heart failure Father   . Hyperlipidemia Father   . Hypertension Father   . Osteoarthritis Mother     SOCIAL HISTORY: Social History   Socioeconomic History  . Marital status: Married    Spouse name: Not on file  . Number of children: Not on file  . Years of education: Not on file  . Highest education level: Not on file  Occupational History  . Not on file  Tobacco Use  . Smoking status: Former Smoker    Packs/day: 1.00    Years: 28.00    Pack years: 28.00    Types: Cigarettes    Quit date: 07/02/2005    Years since quitting: 14.7  . Smokeless tobacco: Never Used  Vaping Use  . Vaping Use: Never  used  Substance and Sexual Activity  . Alcohol use: Yes    Alcohol/week: 0.0 standard drinks    Comment: 05/14/2017 "1-2 drinks/year"  . Drug use: No  . Sexual activity: Not Currently    Partners: Male    Comment: declined condoms  Other Topics Concern  . Not on file  Social History Narrative   Lives in [place], with partner of 15 years    Works as Chartered certified accountant at [employer] Strain:   . Difficulty of Paying Living Expenses:   Food Insecurity:   . Worried About Charity fundraiser in the Last Year:   . Arboriculturist in the Last Year:   Transportation  Needs:   . Film/video editor (Medical):   Marland Kitchen Lack of Transportation (Non-Medical):   Physical Activity:   . Days of Exercise per Week:   . Minutes of Exercise per Session:   Stress:   . Feeling of Stress :   Social Connections:   . Frequency of Communication with Friends and Family:   . Frequency of Social Gatherings with Friends and Family:   . Attends Religious Services:   . Active Member of Clubs or Organizations:   . Attends Archivist Meetings:   Marland Kitchen Marital Status:   Intimate Partner Violence:   . Fear of Current or Ex-Partner:   . Emotionally Abused:   Marland Kitchen Physically Abused:   . Sexually Abused:     Allergies  Allergen Reactions  . Amoxicillin-Pot Clavulanate Rash  . Metformin And Related Diarrhea    Current Outpatient Medications  Medication Sig Dispense Refill  . TRESIBA FLEXTOUCH 100 UNIT/ML FlexTouch Pen INJECT 23 UNITS TOTAL INTO THE SKIN DAILY 21 mL 3  . Accu-Chek FastClix Lancets MISC USE TO CHECK BLOOD SUGAR UP TO 4 TIMES A DAY 102 each PRN  . ACCU-CHEK GUIDE test strip USE TO CHECK BLOOD SUGAR UP TO 4 TIMES A DAY 400 strip PRN  . acetaminophen (TYLENOL) 500 MG tablet Take 1,000 mg by mouth 2 (two) times daily.    . Alcohol Swabs (EASY TOUCH ALCOHOL PREP MEDIUM) 70 % PADS Use as directed up to 10 daily 1000 each 3  . atorvastatin (LIPITOR) 40 MG tablet TAKE 1 TABLET (40 MG TOTAL) BY MOUTH DAILY. 90 tablet 1  . baclofen (LIORESAL) 10 MG tablet TAKE 1 TABLET BY MOUTH THREE TIMES DAILY 90 tablet 0  . bictegravir-emtricitabine-tenofovir AF (BIKTARVY) 50-200-25 MG TABS tablet Take 1 tablet by mouth daily. 30 tablet 11  . Blood Glucose Monitoring Suppl (FREESTYLE LITE) DEVI     . chlorpheniramine (CHLOR-TRIMETON) 4 MG tablet Take 1 tablet (4 mg total) by mouth every 4 (four) hours as needed for allergies. 150 tablet 1  . Continuous Blood Gluc Receiver (FREESTYLE LIBRE 14 DAY READER) DEVI 1 each by Does not apply route QID. 1 Device 0  . Continuous Blood  Gluc Sensor (FREESTYLE LIBRE 14 DAY SENSOR) MISC 1 each by Does not apply route 4 (four) times daily. 2 each 12  . cyclobenzaprine (FLEXERIL) 5 MG tablet TAKE 1 TABLET BY MOUTH 3 TIMES DAILY AS NEEDED FOR MUSCLE SPASMS. 60 tablet 3  . ELIQUIS 5 MG TABS tablet TAKE 1 TABLET (5 MG TOTAL) BY MOUTH TWICE A DAY 180 tablet 2  . FREESTYLE LITE test strip     . gabapentin (NEURONTIN) 800 MG tablet TAKE 1 TABLET (800 MG  TOTAL) BY MOUTH 3 TIMES A DAY 270 tablet 1  . insulin lispro (HUMALOG KWIKPEN) 100 UNIT/ML KwikPen Inject 0.1 mLs (10 Units total) into the skin 4 (four) times daily. Please inject 10U breakfast, 7U lunch, 7U dinner, 5U lunch 15 mL 11  . JARDIANCE 10 MG TABS tablet TAKE 1 TABLET BY MOUTH DAILY BEFORE BREAKFAST 90 tablet 1  . Lancets (FREESTYLE) lancets     . losartan (COZAAR) 50 MG tablet TAKE 1 TABLET BY MOUTH DAILY. 90 tablet 3  . Omega-3 Fatty Acids (FISH OIL) 1000 MG CAPS Take 1 capsule by mouth 2 (two) times daily.     . pantoprazole (PROTONIX) 40 MG tablet TAKE 1 TABLET BY MOUTH DAILY. 30 tablet 3  . polyethylene glycol-electrolytes (NULYTELY) 420 g solution     . sildenafil (VIAGRA) 50 MG tablet Take 1 tablet (50 mg total) by mouth daily as needed for erectile dysfunction. 10 tablet 2  . Simethicone 180 MG CAPS Take 1 capsule (180 mg total) by mouth 3 (three) times daily as needed. 60 capsule 3  . triamterene-hydrochlorothiazide (MAXZIDE) 75-50 MG tablet TAKE 1/2 TABLETS BY MOUTH ONCE A DAY 45 tablet 1  . UNIFINE PENTIPS 31G X 8 MM MISC USE TO INJECT INSULIN 5 TIMES DAILY PLUS LIRAGLUTIDE ONCE DAILY  11  . UNIFINE PENTIPS 32G X 4 MM MISC USE TO INJECT INSULIN 5 TIMES DAILY PLUS LIRAGLUTIDE ONCE DAILY 200 each 10  . verapamil (CALAN-SR) 240 MG CR tablet TAKE 1 TABLET (240 MG TOTAL) BY MOUTH DAILY 90 tablet 1  . VICTOZA 18 MG/3ML SOPN INJECT 0.3 MLS (1.8 MG TOTAL) INTO THE SKIN DAILY. 9 mL 3   No current facility-administered medications for this visit.    REVIEW OF SYSTEMS:  [X]   denotes positive finding, [ ]  denotes negative finding Cardiac  Comments:  Chest pain or chest pressure:    Shortness of breath upon exertion:    Short of breath when lying flat:    Irregular heart rhythm:        Vascular    Pain in calf, thigh, or hip brought on by ambulation:    Pain in feet at night that wakes you up from your sleep:     Blood clot in your veins:    Leg swelling:  x       Pulmonary    Oxygen at home:    Productive cough:     Wheezing:         Neurologic    Sudden weakness in arms or legs:     Sudden numbness in arms or legs:     Sudden onset of difficulty speaking or slurred speech:    Temporary loss of vision in one eye:     Problems with dizziness:         Gastrointestinal    Blood in stool:     Vomited blood:         Genitourinary    Burning when urinating:     Blood in urine:        Psychiatric    Major depression:         Hematologic    Bleeding problems:    Problems with blood clotting too easily:        Skin    Rashes or ulcers: x       Constitutional    Fever or chills:     PHYSICAL EXAM:   Vitals:   04/01/20 1540  BP: 134/82  Pulse: Marland Kitchen)  52  Resp: 18  Temp: (!) 97.5 F (36.4 C)  TempSrc: Temporal  SpO2: 96%  Weight: 219 lb 3.2 oz (99.4 kg)  Height: 6' 2.5" (1.892 m)    GENERAL: The patient is a well-nourished male, in no acute distress. The vital signs are documented above. CARDIAC: There is a regular rate and rhythm.  VASCULAR: I do not detect carotid bruits. He has palpable dorsalis pedis and posterior tibial pulses bilaterally. He has significant hyperpigmentation bilaterally. 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 He has no ulcers.      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  He has an extension into the thigh (vein of Giacomini).  I do not see a clear-cut connection with the popliteal vein at the popliteal  fossa.  PULMONARY: There is good air exchange bilaterally without wheezing or rales. ABDOMEN: Soft and non-tender with normal pitched bowel sounds.  MUSCULOSKELETAL: There are no major deformities or cyanosis. NEUROLOGIC: No focal weakness or paresthesias are detected. SKIN: There are no ulcers or rashes noted. PSYCHIATRIC: The patient has a normal affect.  DATA:    VENOUS DUPLEX: I have independently interpreted his venous duplex scan of the right lower extremity.  There is no evidence of deep venous thrombosis.  There is no evidence of superficial venous thrombosis.  There is deep venous reflux involving the common femoral vein, femoral vein, and popliteal veins.  There is superficial venous reflux involving the right small saphenous vein.  The vein measures up to 0.47 cm in diameter.

## 2020-04-06 ENCOUNTER — Other Ambulatory Visit: Payer: Self-pay | Admitting: Internal Medicine

## 2020-04-06 DIAGNOSIS — M5441 Lumbago with sciatica, right side: Secondary | ICD-10-CM

## 2020-04-06 MED FILL — CYCLOBENZAPRINE HCL 5 MG TA: 5 | 20 days supply | Qty: 60 | Fill #3

## 2020-04-06 MED FILL — SM GAS RELIEF 180 MG CAPS: 180 | 20 days supply | Qty: 60 | Fill #2

## 2020-04-06 MED FILL — FREESTYLE LANCETS: 25 days supply | Qty: 100 | Fill #3

## 2020-04-06 MED FILL — VICTOZA 18 MG/3 ML INJECT P: 18 | 30 days supply | Qty: 9 | Fill #2

## 2020-04-06 MED FILL — BACLOFEN 10 MG TABS: 10 | 30 days supply | Qty: 90 | Fill #0

## 2020-04-09 DIAGNOSIS — I8393 Asymptomatic varicose veins of bilateral lower extremities: Secondary | ICD-10-CM

## 2020-04-12 MED FILL — BIKTARVY 50-200-25 MG TABS: 50-200-25 | 30 days supply | Qty: 30 | Fill #3

## 2020-04-19 ENCOUNTER — Other Ambulatory Visit: Payer: Self-pay | Admitting: Internal Medicine

## 2020-04-19 DIAGNOSIS — Z794 Long term (current) use of insulin: Secondary | ICD-10-CM

## 2020-04-19 MED FILL — GABAPENTIN 800 MG TABLET: 800 | 90 days supply | Qty: 270 | Fill #0

## 2020-04-19 MED FILL — FREESTYLE LITE TEST STRIP: 25 days supply | Qty: 100 | Fill #3

## 2020-04-28 ENCOUNTER — Other Ambulatory Visit: Payer: Self-pay | Admitting: Internal Medicine

## 2020-04-28 DIAGNOSIS — D6859 Other primary thrombophilia: Secondary | ICD-10-CM

## 2020-04-28 MED FILL — PANTOPRAZOLE SOD DR 40 MG T: 40 | 30 days supply | Qty: 30 | Fill #3

## 2020-04-28 MED FILL — ELIQUIS 5 MG TABLET: 5 | 90 days supply | Qty: 180 | Fill #0

## 2020-04-28 MED FILL — LOSARTAN POTASSIUM 50 MG TA: 50 | 90 days supply | Qty: 90 | Fill #1

## 2020-04-29 MED FILL — UNIFINE PENTIPS 32GX5/32: 32G X 4 MM | 40 days supply | Qty: 200 | Fill #2

## 2020-05-04 MED FILL — TRIAMTERENE-HCTZ 75-50 MG T: 75-50 | 90 days supply | Qty: 45 | Fill #1

## 2020-05-06 MED FILL — BIKTARVY 50-200-25 MG TABS: 50-200-25 | 30 days supply | Qty: 30 | Fill #4

## 2020-05-07 ENCOUNTER — Other Ambulatory Visit: Payer: Self-pay | Admitting: Internal Medicine

## 2020-05-07 DIAGNOSIS — M5416 Radiculopathy, lumbar region: Secondary | ICD-10-CM

## 2020-05-07 MED FILL — CYCLOBENZAPRINE HCL 5 MG TA: 5 | 20 days supply | Qty: 60 | Fill #0

## 2020-05-13 ENCOUNTER — Ambulatory Visit: Payer: 59 | Admitting: Podiatry

## 2020-05-17 ENCOUNTER — Other Ambulatory Visit: Payer: Self-pay | Admitting: Internal Medicine

## 2020-05-17 DIAGNOSIS — G8929 Other chronic pain: Secondary | ICD-10-CM

## 2020-05-17 DIAGNOSIS — E1140 Type 2 diabetes mellitus with diabetic neuropathy, unspecified: Secondary | ICD-10-CM

## 2020-05-17 MED FILL — BACLOFEN 10 MG TABS: 10 | 30 days supply | Qty: 90 | Fill #0

## 2020-05-17 MED FILL — VICTOZA 18 MG/3 ML INJECT P: 18 | 30 days supply | Qty: 9 | Fill #3

## 2020-05-17 MED FILL — HUMALOG 100 UNITS/ML KWIKPE: 100 | 90 days supply | Qty: 36 | Fill #0

## 2020-05-17 NOTE — Telephone Encounter (Signed)
Please discuss this medication with PCP at next visit.

## 2020-06-01 ENCOUNTER — Other Ambulatory Visit: Payer: Self-pay | Admitting: Internal Medicine

## 2020-06-01 DIAGNOSIS — I10 Essential (primary) hypertension: Secondary | ICD-10-CM

## 2020-06-01 MED FILL — PANTOPRAZOLE SOD DR 40 MG T: 40 | 30 days supply | Qty: 30 | Fill #0

## 2020-06-01 MED FILL — SM GAS RELIEF 180 MG CAPS: 180 | 20 days supply | Qty: 60 | Fill #3

## 2020-06-01 MED FILL — VERAPAMIL ER 240 MG TABLET: 240 | 90 days supply | Qty: 90 | Fill #0

## 2020-06-01 MED FILL — FREESTYLE LITE TEST STRIP: 25 days supply | Qty: 100 | Fill #4

## 2020-06-01 MED FILL — FREESTYLE LANCETS: 25 days supply | Qty: 100 | Fill #4

## 2020-06-03 MED FILL — BIKTARVY 50-200-25 MG TABS: 50-200-25 | 30 days supply | Qty: 30 | Fill #5

## 2020-06-08 MED FILL — CYCLOBENZAPRINE HCL 5 MG TA: 5 | 20 days supply | Qty: 60 | Fill #1

## 2020-06-08 MED FILL — UNIFINE PENTIPS 32GX5/32: 32G X 4 MM | 40 days supply | Qty: 200 | Fill #3

## 2020-06-18 ENCOUNTER — Other Ambulatory Visit: Payer: Self-pay

## 2020-06-18 ENCOUNTER — Other Ambulatory Visit: Payer: Self-pay | Admitting: Internal Medicine

## 2020-06-18 ENCOUNTER — Encounter: Payer: Self-pay | Admitting: Internal Medicine

## 2020-06-18 ENCOUNTER — Ambulatory Visit (HOSPITAL_COMMUNITY)
Admission: RE | Admit: 2020-06-18 | Discharge: 2020-06-18 | Disposition: A | Discharge status: Home / Self Care | Payer: 59 | Source: Ambulatory Visit | Attending: Internal Medicine | Admitting: Internal Medicine

## 2020-06-18 ENCOUNTER — Ambulatory Visit (INDEPENDENT_AMBULATORY_CARE_PROVIDER_SITE_OTHER): Payer: 59 | Admitting: Internal Medicine

## 2020-06-18 ENCOUNTER — Telehealth: Payer: Self-pay | Admitting: Dietician

## 2020-06-18 VITALS — BP 153/65 | Temp 98.2°F | Ht 74.0 in | Wt 222.8 lb

## 2020-06-18 DIAGNOSIS — Z0000 Encounter for general adult medical examination without abnormal findings: Secondary | ICD-10-CM | POA: Diagnosis not present

## 2020-06-18 DIAGNOSIS — Z794 Long term (current) use of insulin: Secondary | ICD-10-CM

## 2020-06-18 DIAGNOSIS — I493 Ventricular premature depolarization: Secondary | ICD-10-CM | POA: Diagnosis not present

## 2020-06-18 DIAGNOSIS — E1140 Type 2 diabetes mellitus with diabetic neuropathy, unspecified: Secondary | ICD-10-CM

## 2020-06-18 DIAGNOSIS — E0840 Diabetes mellitus due to underlying condition with diabetic neuropathy, unspecified: Secondary | ICD-10-CM

## 2020-06-18 DIAGNOSIS — I10 Essential (primary) hypertension: Secondary | ICD-10-CM | POA: Diagnosis not present

## 2020-06-18 DIAGNOSIS — R001 Bradycardia, unspecified: Secondary | ICD-10-CM

## 2020-06-18 HISTORY — DX: Ventricular premature depolarization: I49.3

## 2020-06-18 LAB — GLUCOSE, CAPILLARY: Glucose-Capillary: 133 mg/dL — ABNORMAL HIGH (ref 70–99)

## 2020-06-18 LAB — POCT GLYCOSYLATED HEMOGLOBIN (HGB A1C): Hemoglobin A1C: 8.4 % — AB (ref 4.0–5.6)

## 2020-06-18 MED ORDER — DEXCOM G6 TRANSMITTER MISC: 3 refills | Status: DC

## 2020-06-18 MED ORDER — DEXCOM G6 SENSOR MISC: 3 refills | Status: DC

## 2020-06-18 NOTE — Assessment & Plan Note (Signed)
PVC: He has fluctuating heart rate in the 40s-60s today.  On heart auscultation and radial pulse palpation, he had an irregular rhythm.  He is completely asymptomatic and denies chest pain, shortness of breath, headaches, dizziness, lightheadedness.  It appears that in the past, his heart rate has been in the 40s-80s.  EKG in the clinic shows sinus rhythm with HR 62. Rhythm strip shows low burden PVC     Assessment and plan: He remains asymptomatic and is currently on calcium channel blocker. I have advised him to contact us or go to the emergency department if he begins to experience palpitation, chest pain, dizziness, lightheadedness, presyncope. If concern arises, will refer to cardiology.

## 2020-06-18 NOTE — Patient Instructions (Signed)
Hi Trevor Fischer,   Thanks for seeing me today.  In regards to your heart, you have a premature ventricular contraction.  The treatment for this problem if it is severe is either beta-blocker or calcium channel blocker and you already on 1 of these medicine.  If you begin experiencing heart palpitations, dizziness, lightheadedness or chest pain, please call us or go to the emergency department.  Take Care! Dr. Eileen Stanford  Please call the internal medicine center clinic if you have any questions or concerns, we may be able to help and keep you from a long and expensive emergency room wait. Our clinic and after hours phone number is [phone], the best time to call is Monday through Friday 9 am to 4 pm but there is always someone available 24/7 if you have an emergency. If you need medication refills please notify your pharmacy one week in advance and they will send [name] a request.

## 2020-06-18 NOTE — Assessment & Plan Note (Signed)
Hypertension: Previous blood pressures have been in the 120s-150s. He states that he just returned from an overnight 12-hour shift and has not taken his medication.  BP Readings from Last 3 Encounters:  06/18/20 (!) 153/65  04/01/20 134/82  03/18/20 124/74    Plan: -Continue triamterene-HCTZ 75-50 mg daily -Continue verapamil 240 mg daily -Continue losartan 50 mg daily

## 2020-06-18 NOTE — Assessment & Plan Note (Signed)
Health maintenance: -Flu vaccine at work

## 2020-06-18 NOTE — Progress Notes (Signed)
   CC: Follow-up hypertension, diabetes mellitus  HPI:  [name] is a 59 y.o. with medical history listed below presented to follow-up on chronic medical problems.  Please see problem based charting for further details.  Past Medical History:  Diagnosis Date  . Allergic rhinitis   . Degenerative joint disease of knee   . DVT (deep venous thrombosis) (HCC) RLE X 2   Ended anticoagulation in 2012  . Flatulence 05/13/2015  . GERD (gastroesophageal reflux disease)   . 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 HIV infection ([place])   . Hx MRSA infection   . Hyperlipidemia   . Hypertension   . Pulmonary embolism ([place]) 05/14/2017  . Superficial thrombophlebitis   . Type II diabetes mellitus ([place]) 9/09   Review of Systems:  As per HPI  Physical Exam:  Vitals:   06/18/20 0935  BP: (!) 153/65  Temp: 98.2 F (36.8 C)  SpO2: 100%  Weight: 222 lb 12.8 oz (101.1 kg)  Height: 6\' 2"  (1.88 m)   Physical Exam Vitals and nursing note reviewed.  Cardiovascular:     Rate and Rhythm: Bradycardia present. Rhythm irregular.     Heart sounds: Normal heart sounds. No murmur heard.   Pulmonary:     Breath sounds: No rales.  Abdominal:     Tenderness: There is no abdominal tenderness.  Neurological:     Mental Status: He is alert.  Psychiatric:        Mood and Affect: Mood normal.        Behavior: Behavior normal.     Assessment & Plan:   See Encounters Tab for problem based charting.  Patient discussed with Dr. Heber [place]

## 2020-06-18 NOTE — Telephone Encounter (Signed)
Danny answered the phone and said their Dexcom G6 Continuous glucose monitoring Systems are working. [name] would like to have prescriptions sent in for a price check. Kasandra Knudsen will let us know if he wants to use the Dexcom CGM

## 2020-06-18 NOTE — Assessment & Plan Note (Signed)
Diabetes mellitus: Uncontrolled.  Last A1c 8.2%. A1C today is 8.4%.  His average blood glucose was 126 mg/dL.  Highest 368 mg/dL, lowest 58 mg/dL.  He was above target 30% of the time, within target 62% of the time and below target 8% of the time.  Plan: -Tresiba 23 units daily -Humalog 9-8-8-5 units with meals --> Will start In-Pen with assistance from [place] in addition to a continuous glucose monitor  -Victoza 1.8 mg daily -Jardiance 5 mg daily -Return to clinic in 3 months

## 2020-06-18 NOTE — Telephone Encounter (Signed)
Ok awesome! Thanks.

## 2020-06-18 NOTE — Progress Notes (Signed)
Per Dr.Agyei and patient's request he was provided with a dewxcom CGM sample sensor and transmitter.  Dexcom G6 Personal CGM Training  Start time:1000    End time: 1100 Total time: [place] was educated about the following:  -Getting to know device    (Phone programmed ) -Setting up device (high alert  250  , low alert 85  ) -Rise alert not set today -Fall alert not set today  -Setting alert profile -Inserting sensor ( done in office today without any problems he left with snsor in warm up mode) -Calibrating- none required for G6 -Ending sensor session -Trouble shooting -Tape guide, clarity information  -Reviewed insulin dosing from dexcom.   Patient has [facility] tech support and my contact information.  [name], RD 06/18/2020 2:35 PM.

## 2020-06-22 ENCOUNTER — Ambulatory Visit: Payer: 59 | Admitting: Podiatry

## 2020-06-22 ENCOUNTER — Telehealth: Payer: Self-pay | Admitting: *Deleted

## 2020-06-22 ENCOUNTER — Other Ambulatory Visit: Payer: Self-pay

## 2020-06-22 DIAGNOSIS — E1165 Type 2 diabetes mellitus with hyperglycemia: Secondary | ICD-10-CM

## 2020-06-22 DIAGNOSIS — M79674 Pain in right toe(s): Secondary | ICD-10-CM

## 2020-06-22 DIAGNOSIS — B351 Tinea unguium: Secondary | ICD-10-CM | POA: Diagnosis not present

## 2020-06-22 DIAGNOSIS — L84 Corns and callosities: Secondary | ICD-10-CM

## 2020-06-22 DIAGNOSIS — Z7901 Long term (current) use of anticoagulants: Secondary | ICD-10-CM

## 2020-06-22 DIAGNOSIS — M79675 Pain in left toe(s): Secondary | ICD-10-CM

## 2020-06-22 NOTE — Telephone Encounter (Addendum)
Information was sent through CoverMyMeds for Dexcom G6 Transmitter.  Awaiting determination.  Sander Nephew, RN 11:54 AM.  PA for Transmitter was approved 1 per 90 days.  Also approved was 1 Dexcom G6 Meter per 12 months effective 06/24/2020 thru 06/23/2021.Reference Authorization [id number].  Sander Nephew, RN 06/25/2020 3:35 PM

## 2020-06-22 NOTE — Telephone Encounter (Addendum)
Information was sent through CoverMyMeds for PA for Dexcom G6 Sensor.  Awaiting determination.  Sander Nephew, RN 06/22/2020 1:21 PM. Additional information was sent to [place].  Awaiting determination.  Sander Nephew, RN 06/24/2020 11:10 AM.  PA for Dexcom Senser has been apprved with 12 fills 06/24/2020 thru 06/23/2021.Request was approved as submitted.  3 sensors per 30 days.  Sander Nephew, RN 06/25/2020  3:31 PM.

## 2020-06-23 NOTE — Progress Notes (Signed)
Internal Medicine Clinic Attending  Case discussed with Dr. Agyei  At the time of the visit.  We reviewed the resident's history and exam and pertinent patient test results.  I agree with the assessment, diagnosis, and plan of care documented in the resident's note.  

## 2020-06-23 NOTE — Progress Notes (Signed)
Subjective: 59 year old male presents the office today for diabetic foot exam as well as for thick elongated toenails that he cannot trim himself. Denies any open sores. No claudication symptoms.  He did follow-up with vascular surgery for the vein issue. His last A1c was 8.4 on 06/18/2020  He is on Eliquis.  On gabapentin for neuropathy  Denies any systemic complaints such as fevers, chills, nausea, vomiting. No acute changes since last appointment, and no other complaints at this time.   Objective: AAO x3, NAD DP/PT pulses palpable bilaterally, CRT less than 3 seconds; chronic swelling present bilaterally with hypermobility changes to the distal legs. Hyperkeratotic tissue to the right hallux.  No underlying ulceration identified today. There is no drainage or pus or any open sores. Nails are hypertrophic, dystrophic, brittle, discolored, elongated 10. No surrounding redness or drainage. Tenderness nails 1-5 bilaterally. No open lesions or pre-ulcerative lesions are identified today. No open lesions or pre-ulcerative lesions.  No pain with calf compression, swelling, warmth, erythema  Assessment: 59 year old male with hyperkeratotic lesion, symptomatic onychomycosis; likely venous insufficiency  Plan: -All treatment options discussed with the patient including all alternatives, risks, complications.  -Hyperkeratotic lesion sharply debrided x2 without any complications or bleeding.  Recommend moisturizer daily.  Discussed the miracle foot cream. -Nails debrided x10 without any complications or bleeding -Discussed the importance of foot inspection. -Patient encouraged to call the office with any questions, concerns, change in symptoms.   Return in about 3 months   Trula Slade DPM

## 2020-06-25 ENCOUNTER — Other Ambulatory Visit: Payer: Self-pay | Admitting: Internal Medicine

## 2020-06-25 ENCOUNTER — Other Ambulatory Visit: Payer: Self-pay | Admitting: Student

## 2020-06-25 DIAGNOSIS — Z794 Long term (current) use of insulin: Secondary | ICD-10-CM

## 2020-06-25 DIAGNOSIS — I10 Essential (primary) hypertension: Secondary | ICD-10-CM

## 2020-06-25 MED FILL — JARDIANCE 10 MG TABLET: 10 | 90 days supply | Qty: 90 | Fill #1

## 2020-06-25 MED FILL — PANTOPRAZOLE SOD DR 40 MG T: 40 | 30 days supply | Qty: 30 | Fill #1

## 2020-06-25 MED FILL — DEXCOM G6 SENSOR MISC: 30 days supply | Qty: 3 | Fill #0

## 2020-06-25 MED FILL — SM ALCOHOL 70% PREP PADS: 70 | 60 days supply | Qty: 600 | Fill #2

## 2020-06-25 MED FILL — DEXCOM G6 TRANSMITTER MISC: 90 days supply | Qty: 1 | Fill #0

## 2020-06-25 MED FILL — ATORVASTATIN 40 MG TABLET: 40 | 90 days supply | Qty: 90 | Fill #0

## 2020-06-29 NOTE — Telephone Encounter (Signed)
Patient has already picked up sensors and transmitter. He says it is affordable and he loves the Continuous glucose monitor.  He has not questions or concerns at this time.

## 2020-07-05 ENCOUNTER — Other Ambulatory Visit: Payer: Self-pay | Admitting: Internal Medicine

## 2020-07-05 DIAGNOSIS — Z794 Long term (current) use of insulin: Secondary | ICD-10-CM

## 2020-07-05 MED FILL — CYCLOBENZAPRINE HCL 5 MG TA: 5 | 20 days supply | Qty: 60 | Fill #2

## 2020-07-05 MED FILL — VICTOZA 18 MG/3 ML INJECT P: 18 | 30 days supply | Qty: 9 | Fill #0

## 2020-07-07 MED FILL — BIKTARVY 50-200-25 MG TABS: 50-200-25 | 30 days supply | Qty: 30 | Fill #6

## 2020-07-12 ENCOUNTER — Other Ambulatory Visit: Payer: Self-pay | Admitting: Internal Medicine

## 2020-07-12 DIAGNOSIS — G8929 Other chronic pain: Secondary | ICD-10-CM

## 2020-07-12 MED FILL — BACLOFEN 10 MG TABS: 10 | 30 days supply | Qty: 90 | Fill #0

## 2020-07-13 ENCOUNTER — Telehealth: Payer: Self-pay | Admitting: Dietician

## 2020-07-13 DIAGNOSIS — E1140 Type 2 diabetes mellitus with diabetic neuropathy, unspecified: Secondary | ICD-10-CM

## 2020-07-13 DIAGNOSIS — Z794 Long term (current) use of insulin: Secondary | ICD-10-CM

## 2020-07-13 NOTE — Telephone Encounter (Signed)
Called to follow up to see if Trevor Fischer was abel to get Dexcom to send him a replacement sensor. He said they are sending one. He also requested a 90 day supply for his sensors. However, the prior authorization was submitted for a 30 days supply so the PA would have to be changed to do this.

## 2020-07-14 ENCOUNTER — Encounter: Payer: Self-pay | Admitting: Dietician

## 2020-07-14 NOTE — Telephone Encounter (Signed)
Call to [facility] about 90 day supply.  Patient will need to ask for a 90 day supply when he requests a refill and pharmacy will handle on their end per [facility].  Sander Nephew, RN 07/14/2020 9:58 AM

## 2020-07-17 ENCOUNTER — Ambulatory Visit: Payer: 59 | Attending: Internal Medicine

## 2020-07-17 DIAGNOSIS — Z23 Encounter for immunization: Secondary | ICD-10-CM

## 2020-07-17 NOTE — Progress Notes (Signed)
   Covid-19 Vaccination Clinic  Name:  Trevor Fischer    MRN: [id number] DOB: 03/02/1961  07/17/2020  [name] was observed post Covid-19 immunization for 15 minutes without incident. He was provided with Vaccine Information Sheet and instruction to access the V-Safe system.   [name] was instructed to call 911 with any severe reactions post vaccine: Marland Kitchen Difficulty breathing  . Swelling of face and throat  . A fast heartbeat  . A bad rash all over body  . Dizziness and weakness

## 2020-07-19 MED FILL — TRESIBA FLEXTOUCH 100 UNITS: 100 | 90 days supply | Qty: 21 | Fill #1

## 2020-07-21 MED FILL — UNIFINE PENTIPS 32GX5/32: 32G X 4 MM | 40 days supply | Qty: 200 | Fill #4

## 2020-07-21 MED FILL — DEXCOM G6 SENSOR MISC: 90 days supply | Qty: 9 | Fill #1

## 2020-07-27 MED FILL — GABAPENTIN 800 MG TABLET: 800 | 90 days supply | Qty: 270 | Fill #1

## 2020-07-27 MED FILL — PANTOPRAZOLE SOD DR 40 MG T: 40 | 30 days supply | Qty: 30 | Fill #2

## 2020-07-27 MED FILL — TRIAMTERENE-HCTZ 75-50 MG T: 75-50 | 90 days supply | Qty: 45 | Fill #0

## 2020-07-29 ENCOUNTER — Encounter: Payer: Self-pay | Admitting: Dietician

## 2020-07-29 ENCOUNTER — Ambulatory Visit (INDEPENDENT_AMBULATORY_CARE_PROVIDER_SITE_OTHER): Payer: 59 | Admitting: Dietician

## 2020-07-29 DIAGNOSIS — Z713 Dietary counseling and surveillance: Secondary | ICD-10-CM

## 2020-07-29 DIAGNOSIS — E1140 Type 2 diabetes mellitus with diabetic neuropathy, unspecified: Secondary | ICD-10-CM | POA: Diagnosis not present

## 2020-07-29 DIAGNOSIS — Z794 Long term (current) use of insulin: Secondary | ICD-10-CM | POA: Diagnosis not present

## 2020-07-29 NOTE — Progress Notes (Signed)
Diabetes Self-Management Education  Visit Type: (P) Follow-up  Appt. Start Time: 0915 Appt. End Time: 4097  07/29/2020  [name], identified by name and date of birth, is a 59 y.o. male with a diagnosis of Diabetes:  Marland Kitchen Type 2  He was first started on a Dexcom sample on 06/18/20. He has been having some difficulty since then with entering sensor codes and now had wrong transmitter in his sensor (it was not the one entered in his phone).   Support provided today with starting a new sensor and transmitter as well as connecting him through [place] remotely to our office. He was provided with a Dexcom instruction sheet and Dexcom cares support information. Dexcom is mailing him a new sensor.   ASSESSMENT  His blood sugars appear to be much improved according to his Dexcom Clarity reports. GMI ~ 7%, ~ 69% in target.    Diabetes Self-Management Education - 07/29/20 1200      Visit Information   Visit Type Follow-up (P)       Health Coping   How would you rate your overall health? Good (P)       Psychosocial Assessment   Patient Belief/Attitude about Diabetes Motivated to manage diabetes (P)     Self-care barriers None (P)     Self-management support Family (P)     Other persons present Other (comment) (P)    Cardiac Rehab RDN   Patient Concerns Monitoring (P)     Special Needs None (P)     Preferred Learning Style No preference indicated (P)     Learning Readiness Change in progress (P)     How often do you need to have someone help you when you read instructions, pamphlets, or other written materials from your doctor or pharmacy? 1 - Never (P)     What is the last grade level you completed in school? 16 (P)       Pre-Education Assessment   Patient understands the diabetes disease and treatment process. Demonstrates understanding / competency (P)     Patient understands incorporating nutritional management into lifestyle. Demonstrates understanding /  competency (P)     Patient undertands incorporating physical activity into lifestyle. Demonstrates understanding / competency (P)     Patient understands using medications safely. Demonstrates understanding / competency (P)     Patient understands monitoring blood glucose, interpreting and using results Needs Review (P)     Patient understands prevention, detection, and treatment of acute complications. Demonstrates understanding / competency (P)     Patient understands prevention, detection, and treatment of chronic complications. Demonstrates understanding / competency (P)     Patient understands how to develop strategies to address psychosocial issues. Demonstrates understanding / competency (P)     Patient understands how to develop strategies to promote health/change behavior. Demonstrates understanding / competency (P)       Complications   Last HgB A1C per patient/outside source 8.4 % (P)     How often do you check your blood sugar? > 4 times/day (P)     Have you had a dilated eye exam in the past 12 months? Yes (P)     Have you had a dental exam in the past 12 months? Yes (P)     Are you checking your feet? Yes (P)       Subsequent Visit   Since your last visit have you continued or begun to take your medications as prescribed? Yes (P)  Since your last visit have you had your blood pressure checked? No (P)     Since your last visit have you experienced any weight changes? No change (P)     Since your last visit, are you checking your blood glucose at least once a day? Yes (P)            Individualized Plan for Diabetes Self-Management Training:   Learning Objective:  Patient will have a greater understanding of diabetes self-management. Patient education plan is to attend individual and/or group sessions per assessed needs and concerns.   Plan:   There are no Patient Instructions on file for this visit.  Expected Outcomes:     Education material provided: Diabetes  Resources  If problems or questions, patient to contact team via:  Phone  Future DSME appointment:   as needed- plan to follow up by phone in 1 week.  [name], RD 07/29/2020 12:22 PM.

## 2020-07-30 MED FILL — BIKTARVY 50-200-25 MG TABS: 50-200-25 | 30 days supply | Qty: 30 | Fill #7

## 2020-08-02 MED FILL — LOSARTAN POTASSIUM 50 MG TA: 50 | 90 days supply | Qty: 90 | Fill #2

## 2020-08-02 MED FILL — VICTOZA 18 MG/3 ML INJECT P: 18 | 30 days supply | Qty: 9 | Fill #1

## 2020-08-09 MED FILL — ELIQUIS 5 MG TABLET: 5 | 90 days supply | Qty: 180 | Fill #1

## 2020-08-09 MED FILL — CYCLOBENZAPRINE HCL 5 MG TA: 5 | 20 days supply | Qty: 60 | Fill #3

## 2020-08-23 ENCOUNTER — Other Ambulatory Visit: Payer: Self-pay | Admitting: Internal Medicine

## 2020-08-23 DIAGNOSIS — G8929 Other chronic pain: Secondary | ICD-10-CM

## 2020-08-23 MED FILL — PANTOPRAZOLE SOD DR 40 MG T: 40 | 30 days supply | Qty: 30 | Fill #3

## 2020-08-24 ENCOUNTER — Other Ambulatory Visit: Payer: Self-pay | Admitting: Student

## 2020-08-24 MED FILL — BACLOFEN 10 MG TABS: 10 | 30 days supply | Qty: 90 | Fill #0

## 2020-08-24 NOTE — Telephone Encounter (Signed)
Medication refill request for baclofen PRN for muscle spasms. I would recommend patient be seen by provider for further evaluation of his muscle spasm/back pain if he is using this medication daily for his back pain.

## 2020-08-24 NOTE — Telephone Encounter (Signed)
Per dr Barnet Glasgow pt needs appt for eval of continuing bacolfen

## 2020-08-27 MED FILL — BIKTARVY 50-200-25 MG TABS: 50-200-25 | 30 days supply | Qty: 30 | Fill #8

## 2020-08-30 MED FILL — VICTOZA 18 MG/3 ML INJECT P: 18 | 30 days supply | Qty: 9 | Fill #2

## 2020-08-30 NOTE — Telephone Encounter (Signed)
This patient has sch his appt with his PCP Dr Eileen Stanford for 09/17/2020.

## 2020-09-01 MED FILL — UNIFINE PENTIPS 32GX5/32: 32G X 4 MM | 40 days supply | Qty: 200 | Fill #5

## 2020-09-07 MED FILL — CYCLOBENZAPRINE HCL 5 MG TA: 5 | 20 days supply | Qty: 60 | Fill #0

## 2020-09-07 MED FILL — ATORVASTATIN 40 MG TABLET: 40 | 90 days supply | Qty: 90 | Fill #1

## 2020-09-07 MED FILL — VERAPAMIL ER 240 MG TABLET: 240 | 90 days supply | Qty: 90 | Fill #1

## 2020-09-13 DIAGNOSIS — E119 Type 2 diabetes mellitus without complications: Secondary | ICD-10-CM | POA: Diagnosis not present

## 2020-09-13 LAB — HM DIABETES EYE EXAM

## 2020-09-17 ENCOUNTER — Encounter: Payer: Self-pay | Admitting: Internal Medicine

## 2020-09-17 ENCOUNTER — Ambulatory Visit (INDEPENDENT_AMBULATORY_CARE_PROVIDER_SITE_OTHER): Payer: 59 | Admitting: Internal Medicine

## 2020-09-17 ENCOUNTER — Other Ambulatory Visit: Payer: Self-pay | Admitting: Internal Medicine

## 2020-09-17 VITALS — BP 134/70 | HR 87 | Temp 98.0°F | Ht 74.0 in | Wt 226.8 lb

## 2020-09-17 DIAGNOSIS — R143 Flatulence: Secondary | ICD-10-CM

## 2020-09-17 DIAGNOSIS — M5441 Lumbago with sciatica, right side: Secondary | ICD-10-CM

## 2020-09-17 DIAGNOSIS — Z794 Long term (current) use of insulin: Secondary | ICD-10-CM

## 2020-09-17 DIAGNOSIS — G8929 Other chronic pain: Secondary | ICD-10-CM

## 2020-09-17 DIAGNOSIS — D6859 Other primary thrombophilia: Secondary | ICD-10-CM | POA: Diagnosis not present

## 2020-09-17 DIAGNOSIS — Z0000 Encounter for general adult medical examination without abnormal findings: Secondary | ICD-10-CM

## 2020-09-17 DIAGNOSIS — E1140 Type 2 diabetes mellitus with diabetic neuropathy, unspecified: Secondary | ICD-10-CM | POA: Diagnosis not present

## 2020-09-17 DIAGNOSIS — M5416 Radiculopathy, lumbar region: Secondary | ICD-10-CM

## 2020-09-17 DIAGNOSIS — R059 Cough, unspecified: Secondary | ICD-10-CM

## 2020-09-17 DIAGNOSIS — I10 Essential (primary) hypertension: Secondary | ICD-10-CM

## 2020-09-17 LAB — POCT GLYCOSYLATED HEMOGLOBIN (HGB A1C): Hemoglobin A1C: 7.8 % — AB (ref 4.0–5.6)

## 2020-09-17 LAB — GLUCOSE, CAPILLARY: Glucose-Capillary: 182 mg/dL — ABNORMAL HIGH (ref 70–99)

## 2020-09-17 MED ORDER — TRIAMTERENE-HCTZ 75-50 MG PO TABS: ORAL_TABLET | ORAL | 1 refills | Status: DC

## 2020-09-17 MED ORDER — LOSARTAN POTASSIUM 50 MG PO TABS: 50.0000 mg | ORAL_TABLET | Freq: Every day | ORAL | 3 refills | Status: DC

## 2020-09-17 MED ORDER — ACETAMINOPHEN 500 MG PO TABS: 1000.0000 mg | ORAL_TABLET | Freq: Two times a day (BID) | ORAL | 3 refills | Status: DC

## 2020-09-17 MED ORDER — INSULIN LISPRO (1 UNIT DIAL) 100 UNIT/ML (KWIKPEN): PEN_INJECTOR | SUBCUTANEOUS | 5 refills | Status: DC

## 2020-09-17 MED ORDER — CHLORPHENIRAMINE MALEATE 4 MG PO TABS
4.0000 mg | ORAL_TABLET | ORAL | 1 refills | Status: DC | PRN
Start: 2020-09-17 — End: 2023-06-18

## 2020-09-17 MED ORDER — SIMETHICONE 180 MG PO CAPS: 180.0000 mg | ORAL_CAPSULE | Freq: Three times a day (TID) | ORAL | 3 refills | Status: AC | PRN

## 2020-09-17 MED ORDER — GABAPENTIN 800 MG PO TABS: ORAL_TABLET | ORAL | 1 refills | Status: DC

## 2020-09-17 MED ORDER — CYCLOBENZAPRINE HCL 5 MG PO TABS: ORAL_TABLET | ORAL | 3 refills | Status: DC

## 2020-09-17 MED ORDER — BACLOFEN 10 MG PO TABS: 10.0000 mg | ORAL_TABLET | Freq: Three times a day (TID) | ORAL | 0 refills | Status: DC

## 2020-09-17 MED ORDER — EMPAGLIFLOZIN 10 MG PO TABS: ORAL_TABLET | ORAL | 1 refills | Status: DC

## 2020-09-17 MED ORDER — ATORVASTATIN CALCIUM 40 MG PO TABS: 40.0000 mg | ORAL_TABLET | Freq: Every day | ORAL | 1 refills | Status: DC

## 2020-09-17 MED ORDER — TRESIBA FLEXTOUCH 100 UNIT/ML ~~LOC~~ SOPN: PEN_INJECTOR | SUBCUTANEOUS | 3 refills | Status: DC

## 2020-09-17 MED ORDER — APIXABAN 5 MG PO TABS: ORAL_TABLET | ORAL | 2 refills | Status: DC

## 2020-09-17 MED FILL — BACLOFEN 10 MG TABS: 10 | 30 days supply | Qty: 90 | Fill #0

## 2020-09-17 MED FILL — HUMALOG 100 UNITS/ML KWIKPE: 100 | 90 days supply | Qty: 36 | Fill #0

## 2020-09-17 MED FILL — JARDIANCE 10 MG TABLET: 10 | 90 days supply | Qty: 90 | Fill #0

## 2020-09-17 NOTE — Assessment & Plan Note (Signed)
Type 2 diabetes mellitus: Last A1c was 8.4%.  A1c today 7.8%  Plan: -Tresiba 23 units daily -Humalog 9-8-8-5 units with meals  -Victoza 1.8 mg daily -Jardiance 5 mg daily -Return to clinic in 3 months

## 2020-09-17 NOTE — Progress Notes (Signed)
   CC: Follow-up hypertension, diabetes mellitus  HPI:  [name] is a 59 y.o. with medical history listed below presented to follow-up on chronic medical problems.  Please see problem based charting for further details.  Past Medical History:  Diagnosis Date  . Allergic rhinitis   . Degenerative joint disease of knee   . DVT (deep venous thrombosis) ([facility]) RLE X 2   Ended anticoagulation in 2012  . Flatulence 05/13/2015  . GERD (gastroesophageal reflux disease)   . 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 HIV infection ([place])   . Hx MRSA infection   . Hyperlipidemia   . Hypertension   . Pulmonary embolism ([place]) 05/14/2017  . Superficial thrombophlebitis   . Type II diabetes mellitus ([place]) 9/09   Review of Systems:  As per HPI  Physical Exam:  Vitals:   09/17/20 0930  BP: 134/70  Pulse: 87  Temp: 98 F (36.7 C)  TempSrc: Oral  SpO2: 98%  Weight: 226 lb 12.8 oz (102.9 kg)  Height: 6\' 2"  (1.88 m)   Physical Exam Vitals and nursing note reviewed.  Constitutional:      Appearance: Normal appearance.  Cardiovascular:     Heart sounds: Normal heart sounds.  Pulmonary:     Breath sounds: Normal breath sounds.  Abdominal:     General: Abdomen is flat.     Palpations: Abdomen is soft.     Comments: Dexcom glucose device at the left abdomen  Neurological:     Mental Status: He is alert.     Assessment & Plan:   See Encounters Tab for problem based charting.  Patient discussed with Dr. Rebeca Alert

## 2020-09-17 NOTE — Progress Notes (Signed)
Internal Medicine Clinic Attending  Case discussed with Dr. Agyei at the time of the visit.  We reviewed the resident's history and exam and pertinent patient test results.  I agree with the assessment, diagnosis, and plan of care documented in the resident's note.  Chey Rachels, M.D., Ph.D.  

## 2020-09-17 NOTE — Patient Instructions (Signed)
Hi Chris,  Thanks for seeing me today.  Congratulations on working on the diabetes.  Your hemoglobin A1c 7.8% today.  Overall, you are doing great.  Take care! Dr. Eileen Stanford  Please call the internal medicine center clinic if you have any questions or concerns, we may be able to help and keep you from a long and expensive emergency room wait. Our clinic and after hours phone number is [phone], the best time to call is Monday through Friday 9 am to 4 pm but there is always someone available 24/7 if you have an emergency. If you need medication refills please notify your pharmacy one week in advance and they will send [name] a request.   If you have not gotten the COVID vaccine, I recommend doing so:  You may get it at your local [facility] or [facility] OR To schedule an appointment for a COVID vaccine or be added to the vaccine wait list: Go to [email or web address]   OR Go to [email or web address]                  OR Call [phone]                                     OR Call [phone] and select Option 2

## 2020-09-17 NOTE — Assessment & Plan Note (Signed)
Hypertension: Controlled and at goal.  BP Readings from Last 3 Encounters:  09/17/20 134/70  06/18/20 (!) 153/65  04/01/20 134/82    Plan: -Continue triamterene-HCTZ 75-50 mg daily -Continue verapamil 240 mg daily -Continue losartan 50 mg daily

## 2020-09-17 NOTE — Assessment & Plan Note (Signed)
Health maintenance: Up-to-date

## 2020-09-21 ENCOUNTER — Ambulatory Visit: Payer: 59 | Admitting: Podiatry

## 2020-09-22 ENCOUNTER — Other Ambulatory Visit: Payer: Self-pay | Admitting: Internal Medicine

## 2020-09-22 ENCOUNTER — Other Ambulatory Visit: Payer: Self-pay | Admitting: Infectious Disease

## 2020-09-27 ENCOUNTER — Other Ambulatory Visit: Payer: Self-pay | Admitting: Internal Medicine

## 2020-09-27 ENCOUNTER — Encounter: Payer: Self-pay | Admitting: Internal Medicine

## 2020-09-27 DIAGNOSIS — N529 Male erectile dysfunction, unspecified: Secondary | ICD-10-CM

## 2020-09-28 ENCOUNTER — Other Ambulatory Visit: Payer: Self-pay | Admitting: Internal Medicine

## 2020-09-28 MED ORDER — SILDENAFIL CITRATE 50 MG PO TABS: 50.0000 mg | ORAL_TABLET | ORAL | 0 refills | Status: DC | PRN

## 2020-09-28 MED FILL — SILDENAFIL CITRATE 50 MG TA: 50 | 90 days supply | Qty: 15 | Fill #0

## 2020-09-29 ENCOUNTER — Other Ambulatory Visit: Payer: Self-pay | Admitting: Internal Medicine

## 2020-09-29 MED FILL — PANTOPRAZOLE SOD DR 40 MG T: 40 | 30 days supply | Qty: 30 | Fill #0

## 2020-09-29 NOTE — Telephone Encounter (Signed)
I don't see any reason not to refill.  Will do 1 month and let PCP or team decide further.

## 2020-09-30 MED FILL — BIKTARVY 50-200-25 MG TABS: 50-200-25 | 30 days supply | Qty: 30 | Fill #9

## 2020-10-04 MED FILL — TRESIBA FLEXTOUCH 100 UNITS: 100 | 90 days supply | Qty: 21 | Fill #2

## 2020-10-04 MED FILL — CYCLOBENZAPRINE HCL 5 MG TA: 5 | 20 days supply | Qty: 60 | Fill #1

## 2020-10-04 MED FILL — VICTOZA 18 MG/3 ML INJECT P: 18 | 30 days supply | Qty: 9 | Fill #3

## 2020-10-05 ENCOUNTER — Ambulatory Visit: Payer: 59 | Admitting: Podiatry

## 2020-10-05 ENCOUNTER — Other Ambulatory Visit: Payer: Self-pay

## 2020-10-05 DIAGNOSIS — Z7901 Long term (current) use of anticoagulants: Secondary | ICD-10-CM | POA: Diagnosis not present

## 2020-10-05 DIAGNOSIS — B351 Tinea unguium: Secondary | ICD-10-CM | POA: Diagnosis not present

## 2020-10-05 DIAGNOSIS — L84 Corns and callosities: Secondary | ICD-10-CM

## 2020-10-05 DIAGNOSIS — E1165 Type 2 diabetes mellitus with hyperglycemia: Secondary | ICD-10-CM | POA: Diagnosis not present

## 2020-10-05 DIAGNOSIS — M79675 Pain in left toe(s): Secondary | ICD-10-CM

## 2020-10-05 DIAGNOSIS — M79674 Pain in right toe(s): Secondary | ICD-10-CM

## 2020-10-05 NOTE — Patient Instructions (Signed)
Diabetes Mellitus and Foot Care Foot care is an important part of your health, especially when you have diabetes. Diabetes may cause you to have problems because of poor blood flow (circulation) to your feet and legs, which can cause your skin to:  Become thinner and drier.  Break more easily.  Heal more slowly.  Peel and crack. You may also have nerve damage (neuropathy) in your legs and feet, causing decreased feeling in them. This means that you may not notice minor injuries to your feet that could lead to more serious problems. Noticing and addressing any potential problems early is the best way to prevent future foot problems. How to care for your feet Foot hygiene  Wash your feet daily with warm water and mild soap. Do not use hot water. Then, pat your feet and the areas between your toes until they are completely dry. Do not soak your feet as this can dry your skin.  Trim your toenails straight across. Do not dig under them or around the cuticle. File the edges of your nails with an emery board or nail file.  Apply a moisturizing lotion or petroleum jelly to the skin on your feet and to dry, brittle toenails. Use lotion that does not contain alcohol and is unscented. Do not apply lotion between your toes. Shoes and socks  Wear clean socks or stockings every day. Make sure they are not too tight. Do not wear knee-high stockings since they may decrease blood flow to your legs.  Wear shoes that fit properly and have enough cushioning. Always look in your shoes before you put them on to be sure there are no objects inside.  To break in new shoes, wear them for just a few hours a day. This prevents injuries on your feet. Wounds, scrapes, corns, and calluses  Check your feet daily for blisters, cuts, bruises, sores, and redness. If you cannot see the bottom of your feet, use a mirror or ask someone for help.  Do not cut corns or calluses or try to remove them with medicine.  If you  find a minor scrape, cut, or break in the skin on your feet, keep it and the skin around it clean and dry. You may clean these areas with mild soap and water. Do not clean the area with peroxide, alcohol, or iodine.  If you have a wound, scrape, corn, or callus on your foot, look at it several times a day to make sure it is healing and not infected. Check for: ? Redness, swelling, or pain. ? Fluid or blood. ? Warmth. ? Pus or a bad smell. General instructions  Do not cross your legs. This may decrease blood flow to your feet.  Do not use heating pads or hot water bottles on your feet. They may burn your skin. If you have lost feeling in your feet or legs, you may not know this is happening until it is too late.  Protect your feet from hot and cold by wearing shoes, such as at the beach or on hot pavement.  Schedule a complete foot exam at least once a year (annually) or more often if you have foot problems. If you have foot problems, report any cuts, sores, or bruises to your health care provider immediately. Contact a health care provider if:  You have a medical condition that increases your risk of infection and you have any cuts, sores, or bruises on your feet.  You have an injury that is not   healing.  You have redness on your legs or feet.  You feel burning or tingling in your legs or feet.  You have pain or cramps in your legs and feet.  Your legs or feet are numb.  Your feet always feel cold.  You have pain around a toenail. Get help right away if:  You have a wound, scrape, corn, or callus on your foot and: ? You have pain, swelling, or redness that gets worse. ? You have fluid or blood coming from the wound, scrape, corn, or callus. ? Your wound, scrape, corn, or callus feels warm to the touch. ? You have pus or a bad smell coming from the wound, scrape, corn, or callus. ? You have a fever. ? You have a red line going up your leg. Summary  Check your feet every day  for cuts, sores, red spots, swelling, and blisters.  Moisturize feet and legs daily.  Wear shoes that fit properly and have enough cushioning.  If you have foot problems, report any cuts, sores, or bruises to your health care provider immediately.  Schedule a complete foot exam at least once a year (annually) or more often if you have foot problems. This information is not intended to replace advice given to you by your health care provider. Make sure you discuss any questions you have with your health care provider. Document Revised: 06/11/2019 Document Reviewed: 10/20/2016 Elsevier Patient Education  2020 [employer]  

## 2020-10-08 NOTE — Progress Notes (Signed)
Subjective: 60 year old male presents the office today for diabetic foot exam as well as for thick elongated toenails that he cannot trim himself. Denies any open sores. No claudication symptoms.  His last A1c was 87.8 on 09/17/2020  He is on Eliquis.  On gabapentin for neuropathy  He has seen vascular surgery for venous insuffiencey.   Denies any systemic complaints such as fevers, chills, nausea, vomiting. No acute changes since last appointment, and no other complaints at this time.   Objective: AAO x3, NAD DP/PT pulses palpable bilaterally, CRT less than 3 seconds; chronic swelling present bilaterally with hypermobility changes to the distal legs. Mild Hyperkeratotic tissue to the right hallux.  No underlying ulceration identified today. There is no drainage or pus or any open sores. Nails are hypertrophic, dystrophic, brittle, discolored, elongated 10. No surrounding redness or drainage. Tenderness nails 1-5 bilaterally. No open lesions or pre-ulcerative lesions are identified today. No open lesions or pre-ulcerative lesions.  No pain with calf compression, swelling, warmth, erythema  Assessment: 60 year old male with hyperkeratotic lesion, symptomatic onychomycosis  Plan: -All treatment options discussed with the patient including all alternatives, risks, complications.  -Hyperkeratotic lesion sharply debrided x2 without any complications or bleeding.  Recommend moisturizer daily.  Discussed the miracle foot cream. -Nails debrided x10 without any complications or bleeding -Discussed the importance of foot inspection. -Patient encouraged to call the office with any questions, concerns, change in symptoms.   Return in about 3 months   Trula Slade DPM

## 2020-10-11 MED FILL — SM ALCOHOL 70% PREP PADS: 70 | 60 days supply | Qty: 600 | Fill #3

## 2020-10-11 MED FILL — UNIFINE PENTIPS 32GX5/32: 32G X 4 MM | 40 days supply | Qty: 200 | Fill #6

## 2020-10-25 ENCOUNTER — Other Ambulatory Visit: Payer: Self-pay | Admitting: Internal Medicine

## 2020-10-25 MED FILL — DEXCOM G6 TRANSMITTER MISC: 90 days supply | Qty: 1 | Fill #1

## 2020-10-25 MED FILL — LOSARTAN POTASSIUM 50 MG TA: 50 | 30 days supply | Qty: 30 | Fill #3

## 2020-10-25 MED FILL — DEXCOM G6 SENSOR MISC: 90 days supply | Qty: 9 | Fill #2

## 2020-10-25 MED FILL — GABAPENTIN 800 MG TABLET: 800 | 90 days supply | Qty: 270 | Fill #0

## 2020-10-25 MED FILL — TRIAMTERENE-HCTZ 75-50 MG T: 75-50 | 90 days supply | Qty: 45 | Fill #1

## 2020-10-26 ENCOUNTER — Other Ambulatory Visit: Payer: Self-pay | Admitting: Internal Medicine

## 2020-10-26 MED FILL — PANTOPRAZOLE SOD DR 40 MG T: 40 | 30 days supply | Qty: 30 | Fill #0

## 2020-10-26 MED FILL — BIKTARVY 50-200-25 MG TABS: 50-200-25 | 30 days supply | Qty: 30 | Fill #10

## 2020-11-02 ENCOUNTER — Other Ambulatory Visit: Payer: Self-pay | Admitting: Internal Medicine

## 2020-11-02 ENCOUNTER — Other Ambulatory Visit: Payer: Self-pay | Admitting: Student

## 2020-11-02 DIAGNOSIS — E1140 Type 2 diabetes mellitus with diabetic neuropathy, unspecified: Secondary | ICD-10-CM

## 2020-11-02 MED FILL — CYCLOBENZAPRINE HCL 5 MG TA: 5 | 20 days supply | Qty: 60 | Fill #2

## 2020-11-03 MED FILL — VICTOZA 18 MG/3 ML INJECT P: 18 | 30 days supply | Qty: 9 | Fill #0

## 2020-11-09 MED FILL — ELIQUIS 5 MG TABLET: 5 | 90 days supply | Qty: 180 | Fill #2

## 2020-11-10 ENCOUNTER — Other Ambulatory Visit: Payer: 59

## 2020-11-10 ENCOUNTER — Other Ambulatory Visit: Payer: Self-pay

## 2020-11-10 DIAGNOSIS — B20 Human immunodeficiency virus [HIV] disease: Secondary | ICD-10-CM | POA: Diagnosis not present

## 2020-11-10 DIAGNOSIS — E1140 Type 2 diabetes mellitus with diabetic neuropathy, unspecified: Secondary | ICD-10-CM | POA: Diagnosis not present

## 2020-11-10 DIAGNOSIS — I10 Essential (primary) hypertension: Secondary | ICD-10-CM

## 2020-11-10 DIAGNOSIS — Z794 Long term (current) use of insulin: Secondary | ICD-10-CM | POA: Diagnosis not present

## 2020-11-11 LAB — T-HELPER CELL (CD4) - (RCID CLINIC ONLY)
CD4 % Helper T Cell: 42 % (ref 33–65)
CD4 T Cell Abs: 607 /uL (ref 400–1790)

## 2020-11-13 LAB — HIV-1 RNA QUANT-NO REFLEX-BLD
HIV 1 RNA Quant: <20 Copies/mL
HIV-1 RNA Quant, Log: <1.3 Log cps/mL

## 2020-11-13 LAB — CBC WITH DIFFERENTIAL/PLATELET
Absolute Monocytes: 446 cells/uL (ref 200–950)
Basophils Absolute: 50 cells/uL (ref 0–200)
Basophils Relative: 1.1 %
Eosinophils Absolute: 117 cells/uL (ref 15–500)
Eosinophils Relative: 2.6 %
HCT: 46.2 % (ref 38.5–50.0)
Hemoglobin: 14.7 g/dL (ref 13.2–17.1)
Lymphs Abs: 1508 cells/uL (ref 850–3900)
MCH: 24.4 pg — ABNORMAL LOW (ref 27.0–33.0)
MCHC: 31.8 g/dL — ABNORMAL LOW (ref 32.0–36.0)
MCV: 76.6 fL — ABNORMAL LOW (ref 80.0–100.0)
MPV: 9.6 fL (ref 7.5–12.5)
Monocytes Relative: 9.9 %
Neutro Abs: 2381 cells/uL (ref 1500–7800)
Neutrophils Relative %: 52.9 %
Platelets: 248 10*3/uL (ref 140–400)
RBC: 6.03 10*6/uL — ABNORMAL HIGH (ref 4.20–5.80)
RDW: 14.4 % (ref 11.0–15.0)
Total Lymphocyte: 33.5 %
WBC: 4.5 10*3/uL (ref 3.8–10.8)

## 2020-11-13 LAB — COMPLETE METABOLIC PANEL WITH GFR
AG Ratio: 1.8 (calc) (ref 1.0–2.5)
ALT: 43 U/L (ref 9–46)
AST: 30 U/L (ref 10–35)
Albumin: 4.3 g/dL (ref 3.6–5.1)
Alkaline phosphatase (APISO): 101 U/L (ref 35–144)
BUN: 16 mg/dL (ref 7–25)
CO2: 32 mmol/L (ref 20–32)
Calcium: 9.2 mg/dL (ref 8.6–10.3)
Chloride: 102 mmol/L (ref 98–110)
Creat: 1.11 mg/dL (ref 0.70–1.25)
GFR, Est African American: 83 mL/min/{1.73_m2} (ref >=60)
GFR, Est Non African American: 71 mL/min/{1.73_m2} (ref >=60)
Globulin: 2.4 g/dL (calc) (ref 1.9–3.7)
Glucose, Bld: 153 mg/dL — ABNORMAL HIGH (ref 65–99)
Potassium: 4.4 mmol/L (ref 3.5–5.3)
Sodium: 139 mmol/L (ref 135–146)
Total Bilirubin: 0.5 mg/dL (ref 0.2–1.2)
Total Protein: 6.7 g/dL (ref 6.1–8.1)

## 2020-11-13 LAB — LIPID PANEL
Cholesterol: 180 mg/dL (ref <200)
HDL: 41 mg/dL (ref >=40)
LDL Cholesterol (Calc): 99 mg/dL (calc)
Non-HDL Cholesterol (Calc): 139 mg/dL (calc) — ABNORMAL HIGH (ref <130)
Total CHOL/HDL Ratio: 4.4 (calc) (ref <5.0)
Triglycerides: 282 mg/dL — ABNORMAL HIGH (ref <150)

## 2020-11-13 LAB — RPR: RPR Ser Ql: NONREACTIVE

## 2020-11-22 ENCOUNTER — Other Ambulatory Visit: Payer: Self-pay | Admitting: Internal Medicine

## 2020-11-22 DIAGNOSIS — G8929 Other chronic pain: Secondary | ICD-10-CM

## 2020-11-22 MED FILL — BACLOFEN 10 MG TABS: 10 | 30 days supply | Qty: 90 | Fill #0

## 2020-11-22 MED FILL — UNIFINE PENTIPS 32GX5/32: 32G X 4 MM | 40 days supply | Qty: 200 | Fill #7

## 2020-11-25 ENCOUNTER — Other Ambulatory Visit: Payer: Self-pay

## 2020-11-25 ENCOUNTER — Encounter: Payer: Self-pay | Admitting: Infectious Disease

## 2020-11-25 ENCOUNTER — Other Ambulatory Visit: Payer: Self-pay | Admitting: Infectious Disease

## 2020-11-25 ENCOUNTER — Ambulatory Visit: Payer: 59 | Admitting: Infectious Disease

## 2020-11-25 VITALS — BP 122/75 | HR 73 | Temp 98.0°F | Ht 74.0 in | Wt 227.0 lb

## 2020-11-25 DIAGNOSIS — I10 Essential (primary) hypertension: Secondary | ICD-10-CM

## 2020-11-25 DIAGNOSIS — E1140 Type 2 diabetes mellitus with diabetic neuropathy, unspecified: Secondary | ICD-10-CM | POA: Diagnosis not present

## 2020-11-25 DIAGNOSIS — Z794 Long term (current) use of insulin: Secondary | ICD-10-CM

## 2020-11-25 DIAGNOSIS — E881 Lipodystrophy, not elsewhere classified: Secondary | ICD-10-CM | POA: Diagnosis not present

## 2020-11-25 DIAGNOSIS — B20 Human immunodeficiency virus [HIV] disease: Secondary | ICD-10-CM

## 2020-11-25 MED ORDER — BIKTARVY 50-200-25 MG PO TABS: 1.0000 | ORAL_TABLET | Freq: Every day | ORAL | 11 refills | Status: DC
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
## 2020-11-29 MED FILL — BIKTARVY 50-200-25 MG TABS: 50-200-25 | 30 days supply | Qty: 30 | Fill #11

## 2020-11-30 ENCOUNTER — Other Ambulatory Visit: Payer: Self-pay | Admitting: Student

## 2020-11-30 ENCOUNTER — Other Ambulatory Visit: Payer: Self-pay | Admitting: Internal Medicine

## 2020-11-30 MED FILL — LOSARTAN POTASSIUM 50 MG TA: 50 | 30 days supply | Qty: 30 | Fill #4

## 2020-11-30 MED FILL — PANTOPRAZOLE SOD DR 40 MG T: 40 | 30 days supply | Qty: 30 | Fill #0

## 2020-12-09 ENCOUNTER — Other Ambulatory Visit: Payer: Self-pay | Admitting: Internal Medicine

## 2020-12-09 ENCOUNTER — Other Ambulatory Visit: Payer: Self-pay | Admitting: Student

## 2020-12-09 DIAGNOSIS — I10 Essential (primary) hypertension: Secondary | ICD-10-CM

## 2020-12-09 MED FILL — CYCLOBENZAPRINE HCL 5 MG TA: 5 | 20 days supply | Qty: 60 | Fill #3

## 2020-12-09 MED FILL — FREESTYLE LANCETS: 25 days supply | Qty: 100 | Fill #0

## 2020-12-09 MED FILL — VICTOZA 18 MG/3 ML INJECT P: 18 | 30 days supply | Qty: 9 | Fill #1

## 2020-12-09 MED FILL — VERAPAMIL ER 240 MG TABLET: 240 | 90 days supply | Qty: 90 | Fill #0

## 2020-12-20 ENCOUNTER — Other Ambulatory Visit: Payer: Self-pay | Admitting: Student

## 2020-12-20 MED FILL — ATORVASTATIN 40 MG TABLET: 40 | 90 days supply | Qty: 90 | Fill #0

## 2020-12-24 ENCOUNTER — Other Ambulatory Visit: Payer: Self-pay | Admitting: Infectious Disease

## 2020-12-24 DIAGNOSIS — B20 Human immunodeficiency virus [HIV] disease: Secondary | ICD-10-CM

## 2020-12-24 MED FILL — PANTOPRAZOLE SOD DR 40 MG T: 40 | 90 days supply | Qty: 90 | Fill #0

## 2020-12-24 MED FILL — UNIFINE PENTIPS 32GX5/32: 32G X 4 MM | 40 days supply | Qty: 200 | Fill #8

## 2020-12-28 MED FILL — BIKTARVY 50-200-25 MG TABS: 50-200-25 | 30 days supply | Qty: 30 | Fill #0

## 2020-12-30 ENCOUNTER — Other Ambulatory Visit: Payer: Self-pay | Admitting: Internal Medicine

## 2020-12-30 ENCOUNTER — Other Ambulatory Visit (HOSPITAL_COMMUNITY): Payer: Self-pay

## 2020-12-30 DIAGNOSIS — G8929 Other chronic pain: Secondary | ICD-10-CM

## 2020-12-30 DIAGNOSIS — M5441 Lumbago with sciatica, right side: Secondary | ICD-10-CM

## 2020-12-30 MED FILL — CYCLOBENZAPRINE HCL 5 MG TA: 5 | 20 days supply | Qty: 60 | Fill #0

## 2020-12-30 MED FILL — JARDIANCE 10 MG TABLET: 10 | 90 days supply | Qty: 90 | Fill #1

## 2020-12-30 NOTE — Telephone Encounter (Signed)
Good morning! Patient is requesting another muscle relaxant (already has another on his med list). I would like for him to come back into clinic to discuss his medications before re-filling this. Could we schedule an appointment for him in the next week or so?  Thanks! Sanjuan Dame

## 2021-01-01 ENCOUNTER — Other Ambulatory Visit (HOSPITAL_COMMUNITY): Payer: Self-pay

## 2021-01-03 ENCOUNTER — Other Ambulatory Visit: Payer: Self-pay | Admitting: Internal Medicine

## 2021-01-03 ENCOUNTER — Other Ambulatory Visit (HOSPITAL_COMMUNITY): Payer: Self-pay

## 2021-01-03 DIAGNOSIS — G8929 Other chronic pain: Secondary | ICD-10-CM

## 2021-01-03 DIAGNOSIS — M5441 Lumbago with sciatica, right side: Secondary | ICD-10-CM

## 2021-01-05 ENCOUNTER — Other Ambulatory Visit (HOSPITAL_COMMUNITY): Payer: Self-pay

## 2021-01-06 ENCOUNTER — Other Ambulatory Visit (HOSPITAL_COMMUNITY): Payer: Self-pay

## 2021-01-07 ENCOUNTER — Other Ambulatory Visit (HOSPITAL_COMMUNITY): Payer: Self-pay

## 2021-01-10 ENCOUNTER — Other Ambulatory Visit (HOSPITAL_COMMUNITY): Payer: Self-pay

## 2021-01-10 ENCOUNTER — Other Ambulatory Visit: Payer: Self-pay | Admitting: Internal Medicine

## 2021-01-10 ENCOUNTER — Other Ambulatory Visit: Payer: Self-pay

## 2021-01-10 DIAGNOSIS — M5441 Lumbago with sciatica, right side: Secondary | ICD-10-CM

## 2021-01-10 NOTE — Telephone Encounter (Signed)
Pt  is requesting his baclofen (LIORESAL) 10 MG tablet sent to  [facility] Phone:  [phone]  Fax:  [phone]

## 2021-01-11 ENCOUNTER — Other Ambulatory Visit (HOSPITAL_COMMUNITY): Payer: Self-pay

## 2021-01-11 MED ORDER — LOSARTAN POTASSIUM 50 MG PO TABS
50.0000 mg | ORAL_TABLET | Freq: Every day | ORAL | 3 refills | Status: DC
Start: 2020-09-17 — End: 2021-01-19
  Filled 2021-01-11: qty 90, 90d supply, fill #0

## 2021-01-12 ENCOUNTER — Other Ambulatory Visit (HOSPITAL_COMMUNITY): Payer: Self-pay

## 2021-01-12 MED FILL — Liraglutide Soln Pen-injector 18 MG/3ML (6 MG/ML): SUBCUTANEOUS | 30 days supply | Qty: 9 | Fill #0 | Status: AC

## 2021-01-13 ENCOUNTER — Other Ambulatory Visit (HOSPITAL_COMMUNITY): Payer: Self-pay

## 2021-01-13 ENCOUNTER — Telehealth: Payer: Self-pay

## 2021-01-13 DIAGNOSIS — G8929 Other chronic pain: Secondary | ICD-10-CM

## 2021-01-13 MED ORDER — BACLOFEN 10 MG PO TABS
10.0000 mg | ORAL_TABLET | Freq: Three times a day (TID) | ORAL | 0 refills | Status: DC
  Filled 2021-01-13: qty 21, 7d supply, fill #0

## 2021-01-13 NOTE — Telephone Encounter (Signed)
Looks he's been prescribed this since 2019 and he has been following regularly with Dr.Agyei. That's fine. I'll send a script.

## 2021-01-13 NOTE — Telephone Encounter (Signed)
Need refill on  baclofen (LIORESAL) 10 MG tablet ;pt contact [facility] [place]  Pt is wanting a nurse to call him [phone]

## 2021-01-13 NOTE — Telephone Encounter (Signed)
Per chart review, baclofen has been denied several times w/ message from Dr. Collene Gobble dated 12/30/20 that pt needs an appt before refilling muscle relaxer.  RTC, RN informed patient of above, he states he has an appt w/ PCP in June, but will be happy to come in next week to discuss medication.  Appt made w/ Dr. Truman Hayward on 01/19/21 @ 0945.    Pt asking if MD will send Baclofen to [facility] to last him until his appt next week.  RN informed pt per office note, MD did want patient seen before refilling and pt verbalized understanding.  Will forward to Dr. Truman Hayward for consideration. SChaplin, RN,BSN

## 2021-01-17 ENCOUNTER — Ambulatory Visit: Payer: 59 | Admitting: Podiatry

## 2021-01-17 ENCOUNTER — Other Ambulatory Visit: Payer: Self-pay

## 2021-01-17 DIAGNOSIS — L84 Corns and callosities: Secondary | ICD-10-CM

## 2021-01-17 DIAGNOSIS — Z7901 Long term (current) use of anticoagulants: Secondary | ICD-10-CM

## 2021-01-17 DIAGNOSIS — M79674 Pain in right toe(s): Secondary | ICD-10-CM | POA: Diagnosis not present

## 2021-01-17 DIAGNOSIS — B351 Tinea unguium: Secondary | ICD-10-CM | POA: Diagnosis not present

## 2021-01-17 DIAGNOSIS — E1165 Type 2 diabetes mellitus with hyperglycemia: Secondary | ICD-10-CM | POA: Diagnosis not present

## 2021-01-17 DIAGNOSIS — M79675 Pain in left toe(s): Secondary | ICD-10-CM | POA: Diagnosis not present

## 2021-01-17 NOTE — Progress Notes (Signed)
Subjective: 60 year old male presents the office today for diabetic foot exam as well as for thick elongated toenails that he cannot trim himself. Denies any open sores. No claudication symptoms.  States his blood sugars have been "horrible".   His last A1c was 7.8 on 09/17/2020- scheduled to recheck in June  He is on Eliquis.  On gabapentin for neuropathy  Continue with her compression socks for venous insufficiency.  Denies any systemic complaints such as fevers, chills, nausea, vomiting. No acute changes since last appointment, and no other complaints at this time.   Objective: AAO x3, NAD DP/PT pulses palpable bilaterally, CRT less than 3 seconds; chronic swelling present bilaterally with hypermobility changes to the distal legs. Mild Hyperkeratotic tissue to the right and left hallux.  No underlying ulceration identified today. There is no drainage or pus or any open sores. Nails are hypertrophic, dystrophic, brittle, discolored, elongated 10. No surrounding redness or drainage. Tenderness nails 1-5 bilaterally. No open lesions or pre-ulcerative lesions are identified today. No open lesions or pre-ulcerative lesions.  No pain with calf compression, swelling, warmth, erythema  Assessment: 60 year old male with hyperkeratotic lesion, symptomatic onychomycosis  Plan: -All treatment options discussed with the patient including all alternatives, risks, complications.  -Hyperkeratotic lesion sharply debrided x2 without any complications or bleeding.  Recommend moisturizer daily.  Discussed the miracle foot cream. -Nails debrided x10 without any complications or bleeding -Discussed the importance of foot inspection. -Discussed glucose control. -Patient encouraged to call the office with any questions, concerns, change in symptoms.   Return in about 3 months   Trula Slade DPM

## 2021-01-17 NOTE — Patient Instructions (Signed)
Diabetes Mellitus and Foot Care Foot care is an important part of your health, especially when you have diabetes. Diabetes may cause you to have problems because of poor blood flow (circulation) to your feet and legs, which can cause your skin to:  Become thinner and drier.  Break more easily.  Heal more slowly.  Peel and crack. You may also have nerve damage (neuropathy) in your legs and feet, causing decreased feeling in them. This means that you may not notice minor injuries to your feet that could lead to more serious problems. Noticing and addressing any potential problems early is the best way to prevent future foot problems. How to care for your feet Foot hygiene  Wash your feet daily with warm water and mild soap. Do not use hot water. Then, pat your feet and the areas between your toes until they are completely dry. Do not soak your feet as this can dry your skin.  Trim your toenails straight across. Do not dig under them or around the cuticle. File the edges of your nails with an emery board or nail file.  Apply a moisturizing lotion or petroleum jelly to the skin on your feet and to dry, brittle toenails. Use lotion that does not contain alcohol and is unscented. Do not apply lotion between your toes.   Shoes and socks  Wear clean socks or stockings every day. Make sure they are not too tight. Do not wear knee-high stockings since they may decrease blood flow to your legs.  Wear shoes that fit properly and have enough cushioning. Always look in your shoes before you put them on to be sure there are no objects inside.  To break in new shoes, wear them for just a few hours a day. This prevents injuries on your feet. Wounds, scrapes, corns, and calluses  Check your feet daily for blisters, cuts, bruises, sores, and redness. If you cannot see the bottom of your feet, use a mirror or ask someone for help.  Do not cut corns or calluses or try to remove them with medicine.  If you  find a minor scrape, cut, or break in the skin on your feet, keep it and the skin around it clean and dry. You may clean these areas with mild soap and water. Do not clean the area with peroxide, alcohol, or iodine.  If you have a wound, scrape, corn, or callus on your foot, look at it several times a day to make sure it is healing and not infected. Check for: ? Redness, swelling, or pain. ? Fluid or blood. ? Warmth. ? Pus or a bad smell.   General tips  Do not cross your legs. This may decrease blood flow to your feet.  Do not use heating pads or hot water bottles on your feet. They may burn your skin. If you have lost feeling in your feet or legs, you may not know this is happening until it is too late.  Protect your feet from hot and cold by wearing shoes, such as at the beach or on hot pavement.  Schedule a complete foot exam at least once a year (annually) or more often if you have foot problems. Report any cuts, sores, or bruises to your health care provider immediately. Where to find more information  American Diabetes Association: www.diabetes.org  Association of Diabetes Care & Education Specialists: www.diabeteseducator.org Contact a health care provider if:  You have a medical condition that increases your risk of infection and   you have any cuts, sores, or bruises on your feet.  You have an injury that is not healing.  You have redness on your legs or feet.  You feel burning or tingling in your legs or feet.  You have pain or cramps in your legs and feet.  Your legs or feet are numb.  Your feet always feel cold.  You have pain around any toenails. Get help right away if:  You have a wound, scrape, corn, or callus on your foot and: ? You have pain, swelling, or redness that gets worse. ? You have fluid or blood coming from the wound, scrape, corn, or callus. ? Your wound, scrape, corn, or callus feels warm to the touch. ? You have pus or a bad smell coming from  the wound, scrape, corn, or callus. ? You have a fever. ? You have a red line going up your leg. Summary  Check your feet every day for blisters, cuts, bruises, sores, and redness.  Apply a moisturizing lotion or petroleum jelly to the skin on your feet and to dry, brittle toenails.  Wear shoes that fit properly and have enough cushioning.  If you have foot problems, report any cuts, sores, or bruises to your health care provider immediately.  Schedule a complete foot exam at least once a year (annually) or more often if you have foot problems. This information is not intended to replace advice given to you by your health care provider. Make sure you discuss any questions you have with your health care provider. Document Revised: 04/08/2020 Document Reviewed: 04/08/2020 Elsevier Patient Education  2021 [employer]  

## 2021-01-18 ENCOUNTER — Other Ambulatory Visit (HOSPITAL_COMMUNITY): Payer: Self-pay

## 2021-01-18 MED FILL — Insulin Degludec Soln Pen-Injector 100 Unit/ML: SUBCUTANEOUS | 78 days supply | Qty: 18 | Fill #0 | Status: CN

## 2021-01-18 MED FILL — Continuous Glucose System Sensor: 90 days supply | Qty: 9 | Fill #0 | Status: CN

## 2021-01-18 MED FILL — Continuous Glucose System Transmitter: 90 days supply | Qty: 1 | Fill #0 | Status: CN

## 2021-01-19 ENCOUNTER — Encounter: Payer: Self-pay | Admitting: Internal Medicine

## 2021-01-19 ENCOUNTER — Other Ambulatory Visit (HOSPITAL_COMMUNITY): Payer: Self-pay

## 2021-01-19 ENCOUNTER — Ambulatory Visit (INDEPENDENT_AMBULATORY_CARE_PROVIDER_SITE_OTHER): Payer: 59 | Admitting: Internal Medicine

## 2021-01-19 ENCOUNTER — Encounter: Payer: Self-pay | Admitting: *Deleted

## 2021-01-19 ENCOUNTER — Other Ambulatory Visit: Payer: Self-pay

## 2021-01-19 VITALS — BP 128/68 | HR 82 | Temp 98.3°F | Ht 74.0 in | Wt 222.8 lb

## 2021-01-19 DIAGNOSIS — N529 Male erectile dysfunction, unspecified: Secondary | ICD-10-CM | POA: Diagnosis not present

## 2021-01-19 DIAGNOSIS — D6859 Other primary thrombophilia: Secondary | ICD-10-CM | POA: Diagnosis not present

## 2021-01-19 DIAGNOSIS — E1140 Type 2 diabetes mellitus with diabetic neuropathy, unspecified: Secondary | ICD-10-CM

## 2021-01-19 DIAGNOSIS — Z794 Long term (current) use of insulin: Secondary | ICD-10-CM | POA: Diagnosis not present

## 2021-01-19 DIAGNOSIS — K219 Gastro-esophageal reflux disease without esophagitis: Secondary | ICD-10-CM | POA: Diagnosis not present

## 2021-01-19 DIAGNOSIS — M5416 Radiculopathy, lumbar region: Secondary | ICD-10-CM

## 2021-01-19 DIAGNOSIS — M5441 Lumbago with sciatica, right side: Secondary | ICD-10-CM | POA: Diagnosis not present

## 2021-01-19 DIAGNOSIS — G8929 Other chronic pain: Secondary | ICD-10-CM | POA: Diagnosis not present

## 2021-01-19 DIAGNOSIS — I10 Essential (primary) hypertension: Secondary | ICD-10-CM | POA: Diagnosis not present

## 2021-01-19 LAB — POCT GLYCOSYLATED HEMOGLOBIN (HGB A1C): Hemoglobin A1C: 7.9 % — AB (ref 4.0–5.6)

## 2021-01-19 LAB — GLUCOSE, CAPILLARY: Glucose-Capillary: 135 mg/dL — ABNORMAL HIGH (ref 70–99)

## 2021-01-19 MED ORDER — ATORVASTATIN CALCIUM 40 MG PO TABS
40.0000 mg | ORAL_TABLET | Freq: Every day | ORAL | 1 refills | Status: DC
  Filled 2021-01-19 – 2021-03-23 (×2): qty 90, 90d supply, fill #0
  Filled 2021-06-29: qty 90, 90d supply, fill #1

## 2021-01-19 MED ORDER — BACLOFEN 20 MG PO TABS
20.0000 mg | ORAL_TABLET | Freq: Three times a day (TID) | ORAL | 2 refills | Status: DC
  Filled 2021-01-19 – 2021-01-25 (×2): qty 90, 30d supply, fill #0
  Filled 2021-03-14: qty 90, 30d supply, fill #1
  Filled 2021-04-18: qty 90, 30d supply, fill #2

## 2021-01-19 MED ORDER — DEXCOM G6 TRANSMITTER MISC
3 refills | Status: DC
  Filled 2021-01-19: qty 1, 90d supply, fill #0
  Filled 2021-04-18: qty 1, 90d supply, fill #1
  Filled 2021-08-15: qty 1, 90d supply, fill #2
  Filled 2021-10-17 – 2021-11-23 (×2): qty 1, 90d supply, fill #3

## 2021-01-19 MED ORDER — SILDENAFIL CITRATE 50 MG PO TABS
50.0000 mg | ORAL_TABLET | Freq: Every day | ORAL | 0 refills | Status: DC | PRN
  Filled 2021-01-19 – 2021-04-05 (×2): qty 6, 30d supply, fill #0
  Filled 2022-01-11: qty 6, 30d supply, fill #1

## 2021-01-19 MED ORDER — DEXCOM G6 SENSOR MISC
3 refills | Status: DC
  Filled 2021-01-19: qty 3, 30d supply, fill #0
  Filled 2021-02-17: qty 3, 30d supply, fill #1
  Filled 2021-03-23: qty 3, 30d supply, fill #2
  Filled 2021-04-18: qty 3, 30d supply, fill #3
  Filled 2021-05-23: qty 3, 30d supply, fill #4
  Filled 2021-06-14: qty 3, 30d supply, fill #5
  Filled 2021-07-25: qty 3, 30d supply, fill #6
  Filled 2021-08-15: qty 3, 30d supply, fill #7
  Filled 2021-09-19: qty 3, 30d supply, fill #8
  Filled 2021-10-17: qty 3, 30d supply, fill #9
  Filled 2021-11-23: qty 3, 30d supply, fill #10
  Filled 2021-12-22: qty 3, 30d supply, fill #11

## 2021-01-19 MED ORDER — PANTOPRAZOLE SODIUM 40 MG PO TBEC
40.0000 mg | DELAYED_RELEASE_TABLET | Freq: Every day | ORAL | 3 refills | Status: DC
  Filled 2021-01-19 – 2021-03-23 (×2): qty 30, 30d supply, fill #0
  Filled 2021-04-28: qty 30, 30d supply, fill #1
  Filled 2021-05-23: qty 30, 30d supply, fill #2
  Filled 2021-06-29: qty 30, 30d supply, fill #3

## 2021-01-19 MED ORDER — BACLOFEN 10 MG PO TABS
10.0000 mg | ORAL_TABLET | Freq: Three times a day (TID) | ORAL | 1 refills | Status: DC
  Filled 2021-01-19: qty 90, 30d supply, fill #0

## 2021-01-19 MED ORDER — TRIAMTERENE-HCTZ 75-50 MG PO TABS
0.5000 | ORAL_TABLET | Freq: Every day | ORAL | 1 refills | Status: DC
  Filled 2021-01-19 – 2021-01-25 (×2): qty 45, 90d supply, fill #0
  Filled 2021-05-04: qty 45, 90d supply, fill #1

## 2021-01-19 MED ORDER — LIRAGLUTIDE 18 MG/3ML ~~LOC~~ SOPN
1.8000 mg | PEN_INJECTOR | Freq: Every day | SUBCUTANEOUS | 3 refills | Status: DC
  Filled 2021-01-19 – 2021-02-10 (×2): qty 9, 30d supply, fill #0
  Filled 2021-03-14: qty 9, 30d supply, fill #1
  Filled 2021-04-11: qty 9, 30d supply, fill #2
  Filled 2021-05-16: qty 9, 30d supply, fill #3
  Filled 2021-07-04: qty 9, 30d supply, fill #4

## 2021-01-19 MED ORDER — ACETAMINOPHEN 500 MG PO TABS
1000.0000 mg | ORAL_TABLET | Freq: Two times a day (BID) | ORAL | 3 refills | Status: AC
  Filled 2021-01-19: qty 30, 8d supply, fill #0

## 2021-01-19 MED ORDER — TRESIBA FLEXTOUCH 100 UNIT/ML ~~LOC~~ SOPN
PEN_INJECTOR | SUBCUTANEOUS | 3 refills | Status: DC
  Filled 2021-01-19: qty 21, 91d supply, fill #0
  Filled 2021-01-19: qty 18, 78d supply, fill #0
  Filled 2021-04-05: qty 18, 78d supply, fill #1

## 2021-01-19 MED ORDER — EASY TOUCH ALCOHOL PREP MEDIUM 70 % PADS
MEDICATED_PAD | 3 refills | Status: AC
  Filled 2021-01-19: qty 1000, 90d supply, fill #0

## 2021-01-19 MED ORDER — UNIFINE PENTIPS 31G X 8 MM MISC
11 refills | Status: DC
  Filled 2021-01-19: qty 100, 20d supply, fill #0
  Filled 2021-02-17: qty 100, 16d supply, fill #0

## 2021-01-19 MED ORDER — LOSARTAN POTASSIUM 50 MG PO TABS
50.0000 mg | ORAL_TABLET | Freq: Every day | ORAL | 3 refills | Status: DC
  Filled 2021-01-19 – 2021-04-11 (×2): qty 90, 90d supply, fill #0
  Filled 2021-06-29: qty 90, 90d supply, fill #1
  Filled 2021-09-28: qty 90, 90d supply, fill #2
  Filled 2022-01-02: qty 90, 90d supply, fill #3

## 2021-01-19 MED ORDER — VERAPAMIL HCL ER 240 MG PO TBCR
240.0000 mg | EXTENDED_RELEASE_TABLET | Freq: Every day | ORAL | 1 refills | Status: DC
  Filled 2021-01-19 – 2021-03-23 (×2): qty 90, 90d supply, fill #0
  Filled 2021-06-29: qty 90, 90d supply, fill #1

## 2021-01-19 MED ORDER — GABAPENTIN 800 MG PO TABS
800.0000 mg | ORAL_TABLET | Freq: Three times a day (TID) | ORAL | 1 refills | Status: DC
  Filled 2021-01-19 – 2021-02-20 (×2): qty 270, 90d supply, fill #0
  Filled 2021-05-31: qty 270, 90d supply, fill #1

## 2021-01-19 MED ORDER — ELIQUIS 5 MG PO TABS
5.0000 mg | ORAL_TABLET | Freq: Two times a day (BID) | ORAL | 2 refills | Status: DC
  Filled 2021-01-19 – 2021-02-17 (×2): qty 180, 90d supply, fill #0
  Filled 2021-05-16: qty 180, 90d supply, fill #1
  Filled 2021-08-24: qty 180, 90d supply, fill #2

## 2021-01-19 MED ORDER — NOVOLOG FLEXPEN 100 UNIT/ML ~~LOC~~ SOPN
PEN_INJECTOR | SUBCUTANEOUS | 2 refills | Status: DC
  Filled 2021-01-19: qty 15, 60d supply, fill #0

## 2021-01-19 MED ORDER — EMPAGLIFLOZIN 10 MG PO TABS
10.0000 mg | ORAL_TABLET | Freq: Every day | ORAL | 2 refills | Status: DC
  Filled 2021-01-19 – 2021-04-05 (×2): qty 90, 90d supply, fill #0
  Filled 2021-06-29: qty 90, 90d supply, fill #1

## 2021-01-19 MED FILL — Bictegravir-Emtricitabine-Tenofovir AF Tab 50-200-25 MG: ORAL | 30 days supply | Qty: 30 | Fill #0 | Status: AC

## 2021-01-19 NOTE — Patient Instructions (Signed)
Thank you for allowing [name] to provide your care today. Today we discussed your diabetes    I have ordered no labs for you. I will call if any are abnormal.    Today we made the following changes to your medications.    Please increase your meal time insulin if your blood sugar is above 70 by adding 2 units for every 60mg /dL elevation in your blood sugar  Please follow-up in 3 months.    Should you have any questions or concerns please call the internal medicine clinic at [phone].     Diabetes Mellitus and Nutrition, Adult When you have diabetes, or diabetes mellitus, it is very important to have healthy eating habits because your blood sugar (glucose) levels are greatly affected by what you eat and drink. Eating healthy foods in the right amounts, at about the same times every day, can help you:  Control your blood glucose.  Lower your risk of heart disease.  Improve your blood pressure.  Reach or maintain a healthy weight. What can affect my meal plan? Every person with diabetes is different, and each person has different needs for a meal plan. Your health care provider may recommend that you work with a dietitian to make a meal plan that is best for you. Your meal plan may vary depending on factors such as:  The calories you need.  The medicines you take.  Your weight.  Your blood glucose, blood pressure, and cholesterol levels.  Your activity level.  Other health conditions you have, such as heart or kidney disease. How do carbohydrates affect me? Carbohydrates, also called carbs, affect your blood glucose level more than any other type of food. Eating carbs naturally raises the amount of glucose in your blood. Carb counting is a method for keeping track of how many carbs you eat. Counting carbs is important to keep your blood glucose at a healthy level, especially if you use insulin or take certain oral diabetes medicines. It is important to know how many carbs you can  safely have in each meal. This is different for every person. Your dietitian can help you calculate how many carbs you should have at each meal and for each snack. How does alcohol affect me? Alcohol can cause a sudden decrease in blood glucose (hypoglycemia), especially if you use insulin or take certain oral diabetes medicines. Hypoglycemia can be a life-threatening condition. Symptoms of hypoglycemia, such as sleepiness, dizziness, and confusion, are similar to symptoms of having too much alcohol.  Do not drink alcohol if: ? Your health care provider tells you not to drink. ? You are pregnant, may be pregnant, or are planning to become pregnant.  If you drink alcohol: ? Do not drink on an empty stomach. ? Limit how much you use to:  0-1 drink a day for women.  0-2 drinks a day for men. ? Be aware of how much alcohol is in your drink. In the [place] one drink equals one 12 oz bottle of beer (355 mL), one 5 oz glass of wine (148 mL), or one 1 oz glass of hard liquor (44 mL). ? Keep yourself hydrated with water, diet soda, or unsweetened iced tea.  Keep in mind that regular soda, juice, and other mixers may contain a lot of sugar and must be counted as carbs. What are tips for following this plan? Reading food labels  Start by checking the serving size on the "Nutrition Facts" label of packaged foods and drinks. The amount  of calories, carbs, fats, and other nutrients listed on the label is based on one serving of the item. Many items contain more than one serving per package.  Check the total grams (g) of carbs in one serving. You can calculate the number of servings of carbs in one serving by dividing the total carbs by 15. For example, if a food has 30 g of total carbs per serving, it would be equal to 2 servings of carbs.  Check the number of grams (g) of saturated fats and trans fats in one serving. Choose foods that have a low amount or none of these fats.  Check the number of  milligrams (mg) of salt (sodium) in one serving. Most people should limit total sodium intake to less than 2,300 mg per day.  Always check the nutrition information of foods labeled as "low-fat" or "nonfat." These foods may be higher in added sugar or refined carbs and should be avoided.  Talk to your dietitian to identify your daily goals for nutrients listed on the label. Shopping  Avoid buying canned, pre-made, or processed foods. These foods tend to be high in fat, sodium, and added sugar.  Shop around the outside edge of the grocery store. This is where you will most often find fresh fruits and vegetables, bulk grains, fresh meats, and fresh dairy. Cooking  Use low-heat cooking methods, such as baking, instead of high-heat cooking methods like deep frying.  Cook using healthy oils, such as olive, canola, or sunflower oil.  Avoid cooking with butter, cream, or high-fat meats. Meal planning  Eat meals and snacks regularly, preferably at the same times every day. Avoid going long periods of time without eating.  Eat foods that are high in fiber, such as fresh fruits, vegetables, beans, and whole grains. Talk with your dietitian about how many servings of carbs you can eat at each meal.  Eat 4-6 oz (112-168 g) of lean protein each day, such as lean meat, chicken, fish, eggs, or tofu. One ounce (oz) of lean protein is equal to: ? 1 oz (28 g) of meat, chicken, or fish. ? 1 egg. ?  cup (62 g) of tofu.  Eat some foods each day that contain healthy fats, such as avocado, nuts, seeds, and fish.   What foods should I eat? Fruits Berries. Apples. Oranges. Peaches. Apricots. Plums. Grapes. Mango. Papaya. Pomegranate. Kiwi. Cherries. Vegetables Lettuce. Spinach. Leafy greens, including kale, chard, collard greens, and mustard greens. Beets. Cauliflower. Cabbage. Broccoli. Carrots. Green beans. Tomatoes. Peppers. Onions. Cucumbers. Brussels sprouts. Grains Whole grains, such as whole-wheat  or whole-grain bread, crackers, tortillas, cereal, and pasta. Unsweetened oatmeal. Quinoa. Brown or wild rice. Meats and other proteins Seafood. Poultry without skin. Lean cuts of poultry and beef. Tofu. Nuts. Seeds. Dairy Low-fat or fat-free dairy products such as milk, yogurt, and cheese. The items listed above may not be a complete list of foods and beverages you can eat. Contact a dietitian for more information. What foods should I avoid? Fruits Fruits canned with syrup. Vegetables Canned vegetables. Frozen vegetables with butter or cream sauce. Grains Refined white flour and flour products such as bread, pasta, snack foods, and cereals. Avoid all processed foods. Meats and other proteins Fatty cuts of meat. Poultry with skin. Breaded or fried meats. Processed meat. Avoid saturated fats. Dairy Full-fat yogurt, cheese, or milk. Beverages Sweetened drinks, such as soda or iced tea. The items listed above may not be a complete list of foods and beverages you should  avoid. Contact a dietitian for more information. Questions to ask a health care provider  Do I need to meet with a diabetes educator?  Do I need to meet with a dietitian?  What number can I call if I have questions?  When are the best times to check my blood glucose? Where to find more information:  American Diabetes Association: diabetes.org  Academy of Nutrition and Dietetics: www.eatright.[employer] of Diabetes and Digestive and Kidney Diseases: [employer].dk  Association of Diabetes Care and Education Specialists: www.diabeteseducator.org Summary  It is important to have healthy eating habits because your blood sugar (glucose) levels are greatly affected by what you eat and drink.  A healthy meal plan will help you control your blood glucose and maintain a healthy lifestyle.  Your health care provider may recommend that you work with a dietitian to make a meal plan that is best for  you.  Keep in mind that carbohydrates (carbs) and alcohol have immediate effects on your blood glucose levels. It is important to count carbs and to use alcohol carefully. This information is not intended to replace advice given to you by your health care provider. Make sure you discuss any questions you have with your health care provider. Document Revised: 08/26/2019 Document Reviewed: 08/26/2019 Elsevier Patient Education  2021 [employer].

## 2021-01-20 ENCOUNTER — Other Ambulatory Visit (HOSPITAL_COMMUNITY): Payer: Self-pay

## 2021-01-20 ENCOUNTER — Encounter: Payer: Self-pay | Admitting: Internal Medicine

## 2021-01-20 NOTE — Assessment & Plan Note (Signed)
Pt requires refills on medications with associated diagnosis above.  Reviewed disease process and find this medication to be necessary, will not change dose or alter current therapy. 

## 2021-01-20 NOTE — Assessment & Plan Note (Addendum)
Lab Results  Component Value Date   HGBA1C 7.9 (A) 01/19/2021   Trevor Fischer is a 60 yo M w/ PMH of DM, HIV, provoked PE on anticoagulation presenting to [facility] for management of his chronic conditions. Uses personal continuous glucose monitor. He is taking his diabetic meds as prescribed. Denies any significant hypoglycemic episodes. Denies nausea, vomiting, diarrhea.  A/P Above goal. Personal cgm data reviewed showing hyperglycemic episodes with meals. Minimal hypoglycemic episodes. Discussed using cgm data to drive insulin dosing with sliding scale - Increase humalog via sliding scale regimen 10-7-7 (+20 per bg 60 above goal) - C/w insulin degludec 23 units daily - C/w liraglutide 1.8mg  daily, empagliflozin 5mg  daily

## 2021-01-20 NOTE — Assessment & Plan Note (Signed)
Having relief with PPI. Requesting refill - C/w pantoprazole 40mg  daily

## 2021-01-20 NOTE — Assessment & Plan Note (Signed)
BP Readings from Last 3 Encounters:  01/19/21 128/68  11/25/20 122/75  09/17/20 134/70    Above goal. Denies chest pain, blurry vision, lower extremity swelling, dyspnea - C/w triamterene-hctz 75-50mg  daily, verapamil 240mg  daily, losartan 50mg  daily

## 2021-01-20 NOTE — Assessment & Plan Note (Signed)
Presents for further management of low back pain. Currently taking baclofen and cyclobenzaprine. Denies lower extremity weakness, numbness or tingling. Prior MRI shows facet arthropathy.  A/P Presents w/ complaint of low back pain. Sxs currently controlled. Discussed risk of taking multiple muscle relaxers and consolidating dose. Trevor Fischer expressed understanding. - D/c cyclobenzaprine - Increase baclofen to 20mg  TID - C/w gabapentin 800mg  TID

## 2021-01-20 NOTE — Progress Notes (Addendum)
CC: Back pain  HPI: [name] is a 60 y.o. with PMH listed below presenting with complaint of back pain. Please see problem based assessment and plan for further details.  Past Medical History:  Diagnosis Date  . Allergic rhinitis   . Degenerative joint disease of knee   . DVT (deep venous thrombosis) (HCC) RLE X 2   Ended anticoagulation in 2012  . Flatulence 05/13/2015  . GERD (gastroesophageal reflux disease)   . 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 HIV infection (Trevor Fischer)   . Hx MRSA infection   . Hyperlipidemia   . Hypertension   . Pulmonary embolism ([place]) 05/14/2017  . Superficial thrombophlebitis   . Type II diabetes mellitus ([name]) 9/09    Review of Systems: Review of Systems  Constitutional: Negative for chills, fever and malaise/fatigue.  Eyes: Negative for blurred vision.  Respiratory: Negative for shortness of breath.   Cardiovascular: Negative for chest pain, palpitations and leg swelling.  Gastrointestinal: Negative for constipation, diarrhea, nausea and vomiting.  Musculoskeletal: Positive for back pain.  All other systems reviewed and are negative.    Physical Exam: Vitals:   01/19/21 0924 01/19/21 0937 01/19/21 0952  BP: (!) 156/82 (!) 141/76 128/68  Pulse: 82    Temp: 98.3 F (36.8 C)    TempSrc: Oral    SpO2: 95%    Weight: 222 lb 12.8 oz (101.1 kg)    Height: 6\' 2"  (1.88 m)      Gen: Well-developed, well nourished, NAD HEENT: NCAT head, hearing intact CV: RRR, S1, S2 normal Pulm: CTAB, No rales, no wheezes MSK: Peripheral pulses intact, No peripheral edema, negative midspinal tenderness, Paraspinal tenderness on lumbar spine, spine flexion, extension range of motion intact Skin: Dry, Warm, normal turgor, no wounds, no rashes, no lesions  Assessment & Plan:   GERD Having relief with PPI. Requesting refill - C/w pantoprazole 40mg  daily  Diabetes mellitus with neuropathy [facility]) Lab Results  Component Value Date   HGBA1C  7.9 (A) 01/19/2021   [name] is a 59 yo M w/ PMH of DM, HIV, provoked PE on anticoagulation presenting to [facility] for management of his chronic conditions. Uses personal continuous glucose monitor. He is taking his diabetic meds as prescribed. Denies any significant hypoglycemic episodes. Denies nausea, vomiting, diarrhea.  A/P Above goal. Personal cgm data reviewed showing hyperglycemic episodes with meals. Minimal hypoglycemic episodes. Discussed using cgm data to drive insulin dosing with sliding scale - Increase humalog via sliding scale regimen 10-7-7 (+20 per bg 60 above goal) - C/w insulin degludec 23 units daily - C/w liraglutide 1.8mg  daily, empagliflozin 5mg  daily  Lumbar radiculopathy, right Presents for further management of low back pain. Currently taking baclofen and cyclobenzaprine. Denies lower extremity weakness, numbness or tingling. Prior MRI shows facet arthropathy.  A/P Presents w/ complaint of low back pain. Sxs currently controlled. Discussed risk of taking multiple muscle relaxers and consolidating dose. [name] expressed understanding. - D/c cyclobenzaprine - Increase baclofen to 20mg  TID - C/w gabapentin 800mg  TID  Erectile dysfunction Pt requires refills on medications with associated diagnosis above.  Reviewed disease process and find this medication to be necessary, will not change dose or alter current therapy.  Hypertension BP Readings from Last 3 Encounters:  01/19/21 128/68  11/25/20 122/75  09/17/20 134/70    Above goal. Denies chest pain, blurry vision, lower extremity swelling, dyspnea - C/w triamterene-hctz 75-50mg  daily, verapamil 240mg  daily, losartan 50mg  daily    Patient discussed with Dr.  Lamar, PGY3 [place] Pager: [phone]

## 2021-01-24 ENCOUNTER — Other Ambulatory Visit (HOSPITAL_COMMUNITY): Payer: Self-pay

## 2021-01-24 NOTE — Progress Notes (Signed)
Internal Medicine Clinic Attending  Case discussed with Dr. Lee  At the time of the visit.  We reviewed the resident's history and exam and pertinent patient test results.  I agree with the assessment, diagnosis, and plan of care documented in the resident's note.    

## 2021-01-25 ENCOUNTER — Other Ambulatory Visit (HOSPITAL_COMMUNITY): Payer: Self-pay

## 2021-01-28 ENCOUNTER — Other Ambulatory Visit (HOSPITAL_COMMUNITY): Payer: Self-pay

## 2021-02-08 ENCOUNTER — Ambulatory Visit: Payer: 59 | Attending: Internal Medicine

## 2021-02-08 ENCOUNTER — Other Ambulatory Visit: Payer: Self-pay

## 2021-02-08 ENCOUNTER — Other Ambulatory Visit (HOSPITAL_BASED_OUTPATIENT_CLINIC_OR_DEPARTMENT_OTHER): Payer: Self-pay

## 2021-02-08 DIAGNOSIS — Z23 Encounter for immunization: Secondary | ICD-10-CM

## 2021-02-08 MED ORDER — PFIZER-BIONT COVID-19 VAC-TRIS 30 MCG/0.3ML IM SUSP
INTRAMUSCULAR | 0 refills | Status: DC
  Filled 2021-02-08: qty 0.3, 1d supply, fill #0

## 2021-02-08 NOTE — Progress Notes (Signed)
   Covid-19 Vaccination Clinic  Name:  OSKER AYOUB    MRN: [id number] DOB: Jan 18, 1961  02/08/2021  Mr. Roy was observed post Covid-19 immunization for 15 minutes without incident. He was provided with Vaccine Information Sheet and instruction to access the V-Safe system.   Mr. Slauson was instructed to call 911 with any severe reactions post vaccine: Mar[place] Kitchen Difficulty breathing  . Swelling of face and throat  . A fast heartbeat  . A bad rash all over body  . Dizziness and weakness   Immunizations Administered    Name Date Dose VIS Date Route   PFIZER Comrnaty(Gray TOP) Covid-19 Vaccine 02/08/2021 10:31 AM 0.3 mL 09/09/2020 Intramuscular   Manufacturer: [employer]   Lot: [id number]   NDC: [phone]

## 2021-02-10 ENCOUNTER — Other Ambulatory Visit: Payer: Self-pay | Admitting: Internal Medicine

## 2021-02-10 ENCOUNTER — Other Ambulatory Visit (HOSPITAL_COMMUNITY): Payer: Self-pay

## 2021-02-10 DIAGNOSIS — M5416 Radiculopathy, lumbar region: Secondary | ICD-10-CM

## 2021-02-10 NOTE — Telephone Encounter (Signed)
This medication was discontinued by Dr. Truman Hayward during patient's last appointment. Will refuse refill at this time. If patient is still having pain, he will need to schedule an appointment to be seen for further evaluation.

## 2021-02-11 ENCOUNTER — Other Ambulatory Visit (HOSPITAL_COMMUNITY): Payer: Self-pay

## 2021-02-17 ENCOUNTER — Other Ambulatory Visit (HOSPITAL_COMMUNITY): Payer: Self-pay

## 2021-02-17 MED ORDER — INSULIN LISPRO (1 UNIT DIAL) 100 UNIT/ML (KWIKPEN)
10.0000 [IU] | PEN_INJECTOR | Freq: Four times a day (QID) | SUBCUTANEOUS | 3 refills | Status: DC
  Filled 2021-02-17: qty 18, 45d supply, fill #0

## 2021-02-17 MED ORDER — BD PEN NEEDLE NANO U/F 32G X 4 MM MISC
11 refills | Status: DC
  Filled 2021-02-17: qty 200, 33d supply, fill #0
  Filled 2021-03-23: qty 200, 33d supply, fill #1
  Filled 2021-05-16: qty 200, 40d supply, fill #2

## 2021-02-17 MED FILL — Insulin Lispro Soln Pen-injector 100 Unit/ML (1 Unit Dial): SUBCUTANEOUS | 90 days supply | Qty: 36 | Fill #0 | Status: AC

## 2021-02-18 ENCOUNTER — Other Ambulatory Visit (HOSPITAL_COMMUNITY): Payer: Self-pay

## 2021-02-21 ENCOUNTER — Other Ambulatory Visit (HOSPITAL_COMMUNITY): Payer: Self-pay

## 2021-02-23 ENCOUNTER — Other Ambulatory Visit (HOSPITAL_COMMUNITY): Payer: Self-pay

## 2021-02-23 ENCOUNTER — Encounter: Payer: Self-pay | Admitting: *Deleted

## 2021-02-23 MED FILL — Bictegravir-Emtricitabine-Tenofovir AF Tab 50-200-25 MG: ORAL | 30 days supply | Qty: 30 | Fill #1 | Status: AC

## 2021-03-02 ENCOUNTER — Ambulatory Visit (INDEPENDENT_AMBULATORY_CARE_PROVIDER_SITE_OTHER): Payer: 59 | Admitting: Internal Medicine

## 2021-03-02 ENCOUNTER — Encounter: Payer: Self-pay | Admitting: Internal Medicine

## 2021-03-02 ENCOUNTER — Encounter: Payer: Self-pay | Admitting: *Deleted

## 2021-03-02 DIAGNOSIS — Z794 Long term (current) use of insulin: Secondary | ICD-10-CM

## 2021-03-02 DIAGNOSIS — E1140 Type 2 diabetes mellitus with diabetic neuropathy, unspecified: Secondary | ICD-10-CM | POA: Diagnosis not present

## 2021-03-02 DIAGNOSIS — I10 Essential (primary) hypertension: Secondary | ICD-10-CM | POA: Diagnosis not present

## 2021-03-02 DIAGNOSIS — Z0000 Encounter for general adult medical examination without abnormal findings: Secondary | ICD-10-CM | POA: Diagnosis not present

## 2021-03-02 NOTE — Assessment & Plan Note (Signed)
#  Type 2 diabetes mellitus: He was seen in the clinic 2 months ago and his hemoglobin A1c was 7.9% from 7.8%.  During that visit, his Humalog was adjusted  Plan: -Tresiba 23 units daily -Humalog 10-7-7 units with meals  -Victoza 1.8 mg daily -Jardiance 5 mg daily

## 2021-03-02 NOTE — Progress Notes (Signed)
   CC: F/u DM, HTN  HPI:  [name] is a 60 y.o. medical history listed below presented to follow-up on chronic medical problems.  Please see problem based charting for further details.  Past Medical History:  Diagnosis Date  . Allergic rhinitis   . Degenerative joint disease of knee   . DVT (deep venous thrombosis) (HCC) RLE X 2   Ended anticoagulation in 2012  . Flatulence 05/13/2015  . GERD (gastroesophageal reflux disease)   . 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 HIV infection ([place])   . Hx MRSA infection   . Hyperlipidemia   . Hypertension   . Pulmonary embolism ([place]) 05/14/2017  . Superficial thrombophlebitis   . Type II diabetes mellitus ([place]) 9/09   Review of Systems:  As per HPI  Physical Exam:  Vitals:   03/02/21 0939  BP: 130/68  SpO2: 99%  Weight: 223 lb 3.2 oz (101.2 kg)   Physical Exam Vitals reviewed.  HENT:     Head: Normocephalic and atraumatic.  Cardiovascular:     Pulses: Normal pulses.     Heart sounds: No murmur heard.   Pulmonary:     Breath sounds: Normal breath sounds. No rales.  Neurological:     Mental Status: He is alert.  Psychiatric:        Mood and Affect: Mood normal.        Behavior: Behavior normal.     Assessment & Plan:   See Encounters Tab for problem based charting.  Patient discussed with Dr. Jimmye Norman

## 2021-03-02 NOTE — Assessment & Plan Note (Signed)
#  Hypertension: Well-controlled.  He remains asymptomatic and denies any cardiopulmonary symptoms  BP Readings from Last 3 Encounters:  03/02/21 130/68  01/19/21 128/68  11/25/20 122/75   Plan: - Continue triamterene-hydrochlorothiazide 75-50 mg daily, verapamil 240 mg daily, losartan 50 mg daily

## 2021-03-02 NOTE — Patient Instructions (Signed)
Hi [name],   Thanks for seeing me today. Your insulin was adjusted at your last visit and I am glad you are doing well. I am not making any changes today.   Take care! Dr. Eileen Stanford  Please call the internal medicine center clinic if you have any questions or concerns, we may be able to help and keep you from a long and expensive emergency room wait. Our clinic and after hours phone number is [phone], the best time to call is Monday through Friday 9 am to 4 pm but there is always someone available 24/7 if you have an emergency. If you need medication refills please notify your pharmacy one week in advance and they will send [name] a request.   If you have not gotten the COVID vaccine, I recommend doing so:  You may get it at your local [facility] or [facility] OR To schedule an appointment for a COVID vaccine or be added to the vaccine wait list: Go to [email or web address]   OR Go to [email or web address]                  OR Call [phone]                                     OR Call [phone] and select Option 2

## 2021-03-02 NOTE — Assessment & Plan Note (Signed)
#  Health maintenance: Up-to-date

## 2021-03-07 NOTE — Progress Notes (Signed)
Internal Medicine Clinic Attending  Case discussed with Dr. Agyei  At the time of the visit.  We reviewed the resident's history and exam and pertinent patient test results.  I agree with the assessment, diagnosis, and plan of care documented in the resident's note.  

## 2021-03-11 ENCOUNTER — Other Ambulatory Visit: Payer: Self-pay | Admitting: *Deleted

## 2021-03-11 DIAGNOSIS — I872 Venous insufficiency (chronic) (peripheral): Secondary | ICD-10-CM

## 2021-03-14 ENCOUNTER — Other Ambulatory Visit (HOSPITAL_COMMUNITY): Payer: Self-pay

## 2021-03-18 ENCOUNTER — Other Ambulatory Visit (HOSPITAL_COMMUNITY): Payer: Self-pay

## 2021-03-22 ENCOUNTER — Other Ambulatory Visit (HOSPITAL_COMMUNITY): Payer: Self-pay

## 2021-03-23 ENCOUNTER — Other Ambulatory Visit (HOSPITAL_COMMUNITY): Payer: Self-pay

## 2021-04-05 ENCOUNTER — Encounter: Payer: Self-pay | Admitting: *Deleted

## 2021-04-05 ENCOUNTER — Other Ambulatory Visit (HOSPITAL_COMMUNITY): Payer: Self-pay

## 2021-04-06 ENCOUNTER — Ambulatory Visit: Payer: 59 | Admitting: Vascular Surgery

## 2021-04-06 ENCOUNTER — Encounter (HOSPITAL_COMMUNITY): Payer: 59

## 2021-04-06 ENCOUNTER — Telehealth: Payer: Self-pay | Admitting: Dietician

## 2021-04-06 NOTE — Telephone Encounter (Signed)
Order form for CGM supplies sent to [name] on March 25, 2021 to fax number [phone]. Trevor Fischer has not received it  Fax form to [phone]

## 2021-04-06 NOTE — Telephone Encounter (Signed)
Thanks. I do not need the form. Dr. Eileen Stanford graduated last month and is no longer with [name]. The form needs to be sent to the Red team for a signature and you may also need to change the NPI

## 2021-04-11 ENCOUNTER — Other Ambulatory Visit (HOSPITAL_COMMUNITY): Payer: Self-pay

## 2021-04-11 MED FILL — Bictegravir-Emtricitabine-Tenofovir AF Tab 50-200-25 MG: ORAL | 30 days supply | Qty: 30 | Fill #2 | Status: AC

## 2021-04-13 ENCOUNTER — Other Ambulatory Visit (HOSPITAL_COMMUNITY): Payer: Self-pay

## 2021-04-13 ENCOUNTER — Encounter: Payer: Self-pay | Admitting: Student

## 2021-04-13 ENCOUNTER — Other Ambulatory Visit: Payer: Self-pay

## 2021-04-13 ENCOUNTER — Ambulatory Visit (INDEPENDENT_AMBULATORY_CARE_PROVIDER_SITE_OTHER): Payer: 59 | Admitting: Student

## 2021-04-13 VITALS — BP 139/72 | HR 67 | Temp 98.6°F | Ht 74.0 in | Wt 220.6 lb

## 2021-04-13 DIAGNOSIS — Z794 Long term (current) use of insulin: Secondary | ICD-10-CM | POA: Diagnosis not present

## 2021-04-13 DIAGNOSIS — I10 Essential (primary) hypertension: Secondary | ICD-10-CM | POA: Diagnosis not present

## 2021-04-13 DIAGNOSIS — E1140 Type 2 diabetes mellitus with diabetic neuropathy, unspecified: Secondary | ICD-10-CM | POA: Diagnosis not present

## 2021-04-13 DIAGNOSIS — R3589 Other polyuria: Secondary | ICD-10-CM | POA: Diagnosis not present

## 2021-04-13 DIAGNOSIS — Z7984 Long term (current) use of oral hypoglycemic drugs: Secondary | ICD-10-CM

## 2021-04-13 LAB — GLUCOSE, CAPILLARY: Glucose-Capillary: 168 mg/dL — ABNORMAL HIGH (ref 70–99)

## 2021-04-13 LAB — POCT GLYCOSYLATED HEMOGLOBIN (HGB A1C): Hemoglobin A1C: 8 % — AB (ref 4.0–5.6)

## 2021-04-13 MED ORDER — TRESIBA FLEXTOUCH 100 UNIT/ML ~~LOC~~ SOPN
PEN_INJECTOR | SUBCUTANEOUS | 3 refills | Status: DC
  Filled 2021-04-13: qty 21, fill #0
  Filled 2021-07-04: qty 21, 84d supply, fill #0
  Filled 2021-09-28: qty 21, 84d supply, fill #1
  Filled 2022-01-02: qty 21, 84d supply, fill #2

## 2021-04-13 NOTE — Progress Notes (Signed)
   CC: diabetes follow-up, polyuria  HPI:  Trevor Fischer is a 60 y.o. with medical history as below presenting to [facility] for follow-up for diabetes and discuss polyuria.  Please see problem-based list for further details, assessments, and plans.  Past Medical History:  Diagnosis Date   Allergic rhinitis    Degenerative joint disease of knee    DVT (deep venous thrombosis) (HCC) RLE X 2   Ended anticoagulation in 2012   Flatulence 05/13/2015   GERD (gastroesophageal reflux disease)    History of DVT of lower extremity 10/08   HIV infection ([place])    Hx MRSA infection    Hyperlipidemia    Hypertension    Pulmonary embolism ([place]) 05/14/2017   Superficial thrombophlebitis    Type II diabetes mellitus ([place]) 9/09   Review of Systems:  As per HPI  Physical Exam:  Vitals:   04/13/21 1320  BP: 139/72  Pulse: 67  Temp: 98.6 F (37 C)  TempSrc: Oral  SpO2: 97%  Weight: 220 lb 9.6 oz (100.1 kg)  Height: 6\' 2"  (1.88 m)   General: Well-developed, well-nourished. No acute distress CV: Regular rate, rhythm. No murmurs, rubs, gallops appreciated Pulm: Normal work of breathing on room air. Clear to auscultation bilaterally MSK: Normal bulk, tone. Trace pitting edema bilaterally Skin: Warm, dry. No rashes or lesions appreciated Neuro: Awake, alert, answering questions appropriately Psych: Normal mood, affect, speech  Assessment & Plan:   See Encounters Tab for problem based charting.  Patient discussed with Dr.  Jimmye Norman

## 2021-04-13 NOTE — Patient Instructions (Signed)
[name], it was a pleasure seeing you today!  Today we discussed: - Frequent urination: We will check your urine and prostate today.  - Diabetes: Your A1c is 8.0%. I would like for you to increase your long-acting insulin to 25 units daily. We will re-check in three months.  I have ordered the following labs today:   Lab Orders  Glucose, capillary  Urinalysis, Complete (81001)  PSA  POC Hbg A1C    I have ordered the following medication/changed the following medications:    Start the following medications: Meds ordered this encounter  Medications   insulin degludec (TRESIBA FLEXTOUCH) 100 UNIT/ML FlexTouch Pen    Sig: INJECT 25 UNITS TOTAL INTO THE SKIN DAILY    Dispense:  21 mL    Refill:  3     Follow-up: 3 months   Please make sure to arrive 15 minutes prior to your next appointment. If you arrive late, you may be asked to reschedule.   We look forward to seeing you next time. Please call our clinic at [phone] if you have any questions or concerns. The best time to call is Monday-Friday from 9am-4pm, but there is someone available 24/7. If after hours or the weekend, call the main hospital number and ask for the Internal Medicine Resident On-Call. If you need medication refills, please notify your pharmacy one week in advance and they will send [name] a request.  Thank you for letting us take part in your care. Wishing you the best!  Thank you, Sanjuan Dame, MD

## 2021-04-14 LAB — URINALYSIS, COMPLETE
Bilirubin, UA: NEGATIVE
Ketones, UA: NEGATIVE
Leukocytes,UA: NEGATIVE
Nitrite, UA: NEGATIVE
Protein,UA: NEGATIVE
RBC, UA: NEGATIVE
Specific Gravity, UA: >=1.03 — AB (ref 1.005–1.030)
Urobilinogen, Ur: 0.2 mg/dL (ref 0.2–1.0)
pH, UA: 6.5 (ref 5.0–7.5)

## 2021-04-14 LAB — MICROSCOPIC EXAMINATION
Bacteria, UA: NONE SEEN
Casts: NONE SEEN /lpf
Epithelial Cells (non renal): NONE SEEN /hpf (ref 0–10)
RBC, Urine: NONE SEEN /hpf (ref 0–2)
WBC, UA: NONE SEEN /hpf (ref 0–5)

## 2021-04-14 LAB — PSA: Prostate Specific Ag, Serum: 4.9 ng/mL — ABNORMAL HIGH (ref 0.0–4.0)

## 2021-04-15 DIAGNOSIS — R3589 Other polyuria: Secondary | ICD-10-CM | POA: Insufficient documentation

## 2021-04-15 NOTE — Assessment & Plan Note (Signed)
A1c today 8.0% from 7.9%. 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 Mentions that he does not have a set schedule when he eats due to this. He denies polydipsia, hypoglycemic events. During his visit, he showed me his continuous glucose monitor on his phone for the past few days. Largely his sugars appear to be in target range with some high targets around possible mealtimes, although difficult to assess given his schedule. I discussed with Trevor Fischer that we can adjust his long-acting insulin for hopeful better control. Can consider switching mealtime coverage in the future if no improvement at next visit.  - Increase Tresiba 25 units daily - Humalog 10-7-7 units with meals - Victoza 1.8mg  daily - Jardiance 5mg  daily - Return to clinic for A1c check in three months

## 2021-04-15 NOTE — Assessment & Plan Note (Signed)
Trevor Fischer reports history of polyuria over the last year. Describes urinating ~20 times daily without relief. States that often when he and his husband are in public he immediately needs to find where the bathroom is because he urinates so frequently. Mentions that he feels that he empties his bladder completely and has a strong stream throughout urination. He does not believe this began with initiation of any certain medication.  Concern for possible BPH given patient's age, although symptoms do not appear to be aligned with this. We will check UA, PSA today.  - UA, PSA today - Tight glycemic control - Consider DRE next appointment  ADDENDUM: PSA only mildly elevated at 4.9, urinalysis shows glucosuria. At this juncture will focus on tight glycemic control. Hopeful that with improved diabetes his symptoms will improve.

## 2021-04-15 NOTE — Assessment & Plan Note (Signed)
BP Readings from Last 3 Encounters:  04/13/21 139/72  03/02/21 130/68  01/19/21 128/68   Blood pressure continues to be controlled. Denies chest pain, dyspnea, lightheadedness. Will continue with current regimen.   - Triamterene-hydrochlorothiazide 75-50mg  daily - Verapamil 240mg  daily - Losartan 50mg  daily

## 2021-04-18 ENCOUNTER — Ambulatory Visit (INDEPENDENT_AMBULATORY_CARE_PROVIDER_SITE_OTHER): Payer: 59 | Admitting: Podiatry

## 2021-04-18 ENCOUNTER — Other Ambulatory Visit (HOSPITAL_COMMUNITY): Payer: Self-pay

## 2021-04-18 ENCOUNTER — Other Ambulatory Visit: Payer: Self-pay

## 2021-04-18 DIAGNOSIS — L84 Corns and callosities: Secondary | ICD-10-CM

## 2021-04-18 DIAGNOSIS — Z7901 Long term (current) use of anticoagulants: Secondary | ICD-10-CM

## 2021-04-18 DIAGNOSIS — B351 Tinea unguium: Secondary | ICD-10-CM | POA: Diagnosis not present

## 2021-04-18 DIAGNOSIS — E1165 Type 2 diabetes mellitus with hyperglycemia: Secondary | ICD-10-CM

## 2021-04-18 DIAGNOSIS — M79675 Pain in left toe(s): Secondary | ICD-10-CM | POA: Diagnosis not present

## 2021-04-18 DIAGNOSIS — M79674 Pain in right toe(s): Secondary | ICD-10-CM

## 2021-04-19 NOTE — Progress Notes (Signed)
Internal Medicine Clinic Attending  Case discussed with Dr. Collene Gobble  At the time of the visit.  We reviewed the resident's history and exam and pertinent patient test results.  I agree with the assessment, diagnosis, and plan of care documented in the resident's note. Urine is concentrated with glucosuria as noted by Dr. Collene Gobble; changing to a nondiuretic antihypertensive medication might reduce symptoms to some degree.  Digital prostate exam appropriate at next visit.

## 2021-04-20 NOTE — Progress Notes (Signed)
Subjective: 60 year old male presents the office today for diabetic foot exam as well as for thick elongated toenails that he cannot trim himself. Denies any open sores. No claudication symptoms.  States his blood sugars have still been high.   His last A1c was 8 on 04/13/2021   He is on Eliquis.  On gabapentin for neuropathy  Continue with her compression socks for venous insufficiency.  Denies any systemic complaints such as fevers, chills, nausea, vomiting. No acute changes since last appointment, and no other complaints at this time.   Objective: AAO x3, NAD DP/PT pulses palpable bilaterally, CRT less than 3 seconds; chronic swelling present bilaterally with hypermobility changes to the distal legs. Mild Hyperkeratotic tissue to the right and left hallux.  No underlying ulceration identified today. There is no drainage or pus or any open sores. Nails are hypertrophic, dystrophic, brittle, discolored, elongated 10. No surrounding redness or drainage. Tenderness nails 1-5 bilaterally. No open lesions or pre-ulcerative lesions are identified today. No open lesions or pre-ulcerative lesions.  No pain with calf compression, swelling, warmth, erythema  Assessment: 60 year old male with hyperkeratotic lesion, symptomatic onychomycosis  Plan: -All treatment options discussed with the patient including all alternatives, risks, complications.  -Hyperkeratotic lesion sharply debrided x2 without any complications or bleeding.  Recommend moisturizer daily. Shoe modifications/arch support. -Nails debrided x10 without any complications or bleeding -Discussed the importance of foot inspection. -Discussed glucose control. -Patient encouraged to call the office with any questions, concerns, change in symptoms.   Return in about 3 months   Trula Slade DPM

## 2021-04-21 ENCOUNTER — Other Ambulatory Visit (HOSPITAL_COMMUNITY): Payer: Self-pay

## 2021-04-28 ENCOUNTER — Other Ambulatory Visit (HOSPITAL_COMMUNITY): Payer: Self-pay

## 2021-05-02 ENCOUNTER — Other Ambulatory Visit: Payer: Self-pay

## 2021-05-04 ENCOUNTER — Other Ambulatory Visit (HOSPITAL_COMMUNITY): Payer: Self-pay

## 2021-05-09 ENCOUNTER — Other Ambulatory Visit (HOSPITAL_COMMUNITY): Payer: Self-pay

## 2021-05-16 ENCOUNTER — Other Ambulatory Visit (HOSPITAL_COMMUNITY): Payer: Self-pay

## 2021-05-16 MED ORDER — INSULIN PEN NEEDLE 32G X 4 MM MISC
8 refills | Status: DC
  Filled 2021-05-16: qty 200, 33d supply, fill #0
  Filled 2021-06-29: qty 200, 33d supply, fill #1
  Filled 2021-08-15: qty 200, 33d supply, fill #2
  Filled 2021-09-19: qty 200, 33d supply, fill #3
  Filled 2021-10-27: qty 100, 16d supply, fill #4

## 2021-05-16 MED FILL — Bictegravir-Emtricitabine-Tenofovir AF Tab 50-200-25 MG: ORAL | 30 days supply | Qty: 30 | Fill #3 | Status: AC

## 2021-05-17 ENCOUNTER — Other Ambulatory Visit (HOSPITAL_COMMUNITY): Payer: Self-pay

## 2021-05-23 ENCOUNTER — Other Ambulatory Visit (HOSPITAL_COMMUNITY): Payer: Self-pay

## 2021-05-24 ENCOUNTER — Other Ambulatory Visit (HOSPITAL_COMMUNITY): Payer: Self-pay

## 2021-05-25 ENCOUNTER — Other Ambulatory Visit: Payer: Self-pay

## 2021-05-25 ENCOUNTER — Ambulatory Visit (INDEPENDENT_AMBULATORY_CARE_PROVIDER_SITE_OTHER): Payer: 59

## 2021-05-25 DIAGNOSIS — Z23 Encounter for immunization: Secondary | ICD-10-CM

## 2021-05-25 NOTE — Progress Notes (Signed)
    [facility]  Name:  Trevor Fischer    MRN: [id number] DOB: [birth year]   05/25/2021  Mr. Alison was observed post JYNNEOS immunization for 15 minutes without incident. He was provided with Vaccine Information Sheet and instruction to access the V-Safe system.   Mr. Rubey was instructed to call 911 with any severe reactions post vaccine: Difficulty breathing  Swelling of face and throat  A fast heartbeat  A bad rash all over body  Dizziness and weakness    Leilyn Frayre T Brooks Sailors

## 2021-05-31 ENCOUNTER — Other Ambulatory Visit (HOSPITAL_COMMUNITY): Payer: Self-pay

## 2021-06-01 ENCOUNTER — Other Ambulatory Visit: Payer: Self-pay

## 2021-06-02 ENCOUNTER — Other Ambulatory Visit (HOSPITAL_COMMUNITY): Payer: Self-pay

## 2021-06-02 ENCOUNTER — Other Ambulatory Visit: Payer: Self-pay | Admitting: Internal Medicine

## 2021-06-02 DIAGNOSIS — G8929 Other chronic pain: Secondary | ICD-10-CM

## 2021-06-03 ENCOUNTER — Other Ambulatory Visit: Payer: Self-pay | Admitting: Internal Medicine

## 2021-06-03 ENCOUNTER — Other Ambulatory Visit (HOSPITAL_COMMUNITY): Payer: Self-pay

## 2021-06-03 DIAGNOSIS — G8929 Other chronic pain: Secondary | ICD-10-CM

## 2021-06-04 MED ORDER — BACLOFEN 20 MG PO TABS
20.0000 mg | ORAL_TABLET | Freq: Three times a day (TID) | ORAL | 2 refills | Status: DC
  Filled 2021-06-04: qty 90, 30d supply, fill #0
  Filled 2021-07-11: qty 90, 30d supply, fill #1
  Filled 2021-08-22: qty 90, 30d supply, fill #2

## 2021-06-07 ENCOUNTER — Other Ambulatory Visit (HOSPITAL_COMMUNITY): Payer: Self-pay

## 2021-06-07 MED FILL — Bictegravir-Emtricitabine-Tenofovir AF Tab 50-200-25 MG: ORAL | 30 days supply | Qty: 30 | Fill #4 | Status: AC

## 2021-06-09 ENCOUNTER — Other Ambulatory Visit (HOSPITAL_COMMUNITY): Payer: Self-pay

## 2021-06-13 ENCOUNTER — Telehealth: Payer: Self-pay

## 2021-06-13 NOTE — Telephone Encounter (Signed)
PA was sent through on cover my meds  for PT [facility] awaiting approval or denial

## 2021-06-14 ENCOUNTER — Other Ambulatory Visit (HOSPITAL_COMMUNITY): Payer: Self-pay

## 2021-06-16 ENCOUNTER — Ambulatory Visit: Payer: 59 | Admitting: Vascular Surgery

## 2021-06-16 ENCOUNTER — Encounter (HOSPITAL_COMMUNITY): Payer: 59

## 2021-06-16 ENCOUNTER — Other Ambulatory Visit (HOSPITAL_COMMUNITY): Payer: Self-pay

## 2021-06-17 NOTE — Telephone Encounter (Signed)
Patient pa was approved start date 06/16/2021-06/15/2022..     Guide line are as followed :     Approved  for 1 transmitter per 90 days .Marland Kitchen  Additional PA have been entered for :   Dexcom G6 receiver for a maximum of 1 fill with a quantity limit of 1 receiver per 12 months. ( [place] 29562)  Dexcom G6 for a maximum of 12 fills with a quantity limts of 3 sensors ( 1KIT) per 30 days  (PA 13086 )   ALL APPROVED ( [place] 57846)

## 2021-06-23 ENCOUNTER — Other Ambulatory Visit (HOSPITAL_COMMUNITY): Payer: Self-pay

## 2021-06-28 ENCOUNTER — Ambulatory Visit (INDEPENDENT_AMBULATORY_CARE_PROVIDER_SITE_OTHER): Payer: 59

## 2021-06-28 ENCOUNTER — Other Ambulatory Visit: Payer: Self-pay

## 2021-06-28 DIAGNOSIS — Z23 Encounter for immunization: Secondary | ICD-10-CM

## 2021-06-29 ENCOUNTER — Other Ambulatory Visit: Payer: Self-pay

## 2021-06-29 ENCOUNTER — Other Ambulatory Visit (HOSPITAL_COMMUNITY): Payer: Self-pay

## 2021-07-01 ENCOUNTER — Other Ambulatory Visit (HOSPITAL_COMMUNITY): Payer: Self-pay

## 2021-07-04 ENCOUNTER — Other Ambulatory Visit: Payer: Self-pay | Admitting: Internal Medicine

## 2021-07-04 ENCOUNTER — Other Ambulatory Visit (HOSPITAL_COMMUNITY): Payer: Self-pay

## 2021-07-04 DIAGNOSIS — Z794 Long term (current) use of insulin: Secondary | ICD-10-CM

## 2021-07-04 DIAGNOSIS — E1140 Type 2 diabetes mellitus with diabetic neuropathy, unspecified: Secondary | ICD-10-CM

## 2021-07-04 MED FILL — Insulin Lispro Soln Pen-injector 100 Unit/ML (1 Unit Dial): SUBCUTANEOUS | 90 days supply | Qty: 36 | Fill #1 | Status: AC

## 2021-07-04 NOTE — Telephone Encounter (Signed)
Received TC from patient's (EC) Sunnie Nielsen, who states patient has been trying to get a refill on Victoza since 9/28 and it is still not at the pharmacy.  Per chart review, refill was received today from [facility].  EC is upset because he states he just called [facility] outpatient pharmacy and was told they have submitted several request and have not heard back from [facility].  Per chart review, there are 2 encounters from 9/28, 1 encounter from 9/30, and 1 encounter from today with the description noted as "Pharmacy Visit", but there are no medication refill request associated with any of these visits.  Only one refill request was entered at 1609 today.  Will ask Dr. Georgina Peer to reach out to Richardson Landry at [facility] regarding how [facility] are entering pharmacy refill request.  EC was notified of above.  Will resubmit refill request to PCP and attending pool to try to expedite refill. Thank you, SChaplin, RN,BSN

## 2021-07-05 ENCOUNTER — Other Ambulatory Visit (HOSPITAL_COMMUNITY): Payer: Self-pay

## 2021-07-07 ENCOUNTER — Other Ambulatory Visit (HOSPITAL_COMMUNITY): Payer: Self-pay

## 2021-07-07 MED FILL — Bictegravir-Emtricitabine-Tenofovir AF Tab 50-200-25 MG: ORAL | 30 days supply | Qty: 30 | Fill #5 | Status: AC

## 2021-07-11 ENCOUNTER — Other Ambulatory Visit: Payer: Self-pay

## 2021-07-11 ENCOUNTER — Encounter: Payer: Self-pay | Admitting: Student

## 2021-07-11 ENCOUNTER — Other Ambulatory Visit (HOSPITAL_COMMUNITY): Payer: Self-pay

## 2021-07-11 ENCOUNTER — Ambulatory Visit (INDEPENDENT_AMBULATORY_CARE_PROVIDER_SITE_OTHER): Payer: 59 | Admitting: Student

## 2021-07-11 VITALS — BP 120/70 | HR 70 | Temp 98.2°F | Ht 74.0 in | Wt 220.0 lb

## 2021-07-11 DIAGNOSIS — Z0000 Encounter for general adult medical examination without abnormal findings: Secondary | ICD-10-CM

## 2021-07-11 DIAGNOSIS — Z794 Long term (current) use of insulin: Secondary | ICD-10-CM | POA: Diagnosis not present

## 2021-07-11 DIAGNOSIS — E1140 Type 2 diabetes mellitus with diabetic neuropathy, unspecified: Secondary | ICD-10-CM

## 2021-07-11 DIAGNOSIS — I10 Essential (primary) hypertension: Secondary | ICD-10-CM

## 2021-07-11 LAB — POCT GLYCOSYLATED HEMOGLOBIN (HGB A1C): Hemoglobin A1C: 7.7 % — AB (ref 4.0–5.6)

## 2021-07-11 LAB — GLUCOSE, CAPILLARY: Glucose-Capillary: 130 mg/dL — ABNORMAL HIGH (ref 70–99)

## 2021-07-11 MED ORDER — EMPAGLIFLOZIN 25 MG PO TABS
25.0000 mg | ORAL_TABLET | Freq: Every day | ORAL | 1 refills | Status: DC
Start: 2021-07-11 — End: 2022-02-20
  Filled 2021-07-11: qty 90, 90d supply, fill #0
  Filled 2021-11-17: qty 90, 90d supply, fill #1

## 2021-07-11 NOTE — Progress Notes (Signed)
   CC: Diabetes follow-up  HPI:  Trevor Fischer is a 60 y.o. male with PMH as below who presents to clinic for follow-up on his diabetes. Please see problem based charting for evaluation, assessment and plan.  Past Medical History:  Diagnosis Date   Allergic rhinitis    Degenerative joint disease of knee    DVT (deep venous thrombosis) (HCC) RLE X 2   Ended anticoagulation in 2012   Flatulence 05/13/2015   GERD (gastroesophageal reflux disease)    History of DVT of lower extremity 10/08   HIV infection ([place])    Hx MRSA infection    Hyperlipidemia    Hypertension    Pulmonary embolism ([place]) 05/14/2017   Superficial thrombophlebitis    Type II diabetes mellitus ([place]) 9/09    Review of Systems:  Constitutional: Negative for fever, fatigue or polydipsia Eyes: Negative for visual changes Respiratory: Negative for shortness of breath Cardiac: Negative for chest pain GU: Negative for polyuria Abdomen: Negative for abdominal pain, constipation or diarrhea Neuro: Negative for headache, dizziness or weakness  Physical Exam: General: Pleasant, well-appearing elderly male. No acute distress. Cardiac: RRR. No murmurs, rubs or gallops. No LE edema Respiratory: Lungs CTAB. No wheezing or crackles. Abdominal: Soft, symmetric and non tender. Normal BS. Skin: Warm, dry and intact without rashes or lesions Extremities: Atraumatic. Full ROM. Palpable radial and DP pulses. Neuro: A&O x 3. Moves all extremities Psych: Appropriate mood and affect.  Vitals:   07/11/21 1514  BP: 120/70  Pulse: 70  Temp: 98.2 F (36.8 C)  TempSrc: Oral  SpO2: 100%  Weight: 220 lb (99.8 kg)  Height: 6\' 2"  (1.88 m)     Assessment & Plan:   See Encounters Tab for problem based charting.  Patient discussed with Dr. Emelia Salisbury, MD, MPH

## 2021-07-11 NOTE — Assessment & Plan Note (Addendum)
Patient here for diabetes follow-up.  He continues to be compliant with his medications and working on lifestyle modifications with dietary changes and exercise.  A1c has improved from 8.0% in July 2022 to 7.7% today.  Reading from meter shows that patient has been in range 65% of the time, high 24%, very high 10% and lows 2%.  He denies any polyuria polydipsia or dizziness.  Patient A1c improving but currently still not at goal.  We will continue to optimize his antidiabetic regimen.  Plan: --Continue Tresiba 25 units daily -- Continue Humalog 10-7-7 units with meals -- Continue Victoza 1.8 mg daily -- Increase Jardiance from 10 mg to 25 mg daily -- A1c check in 3 months.

## 2021-07-11 NOTE — Assessment & Plan Note (Signed)
Patient has received her flu shot.  He has also received 2 doses of the monkeypox vaccine.  Plan to receive the next COVID booster in a few weeks.

## 2021-07-11 NOTE — Assessment & Plan Note (Signed)
Patient's BP continues to be well controlled. BP today is 120/70. Patient does not have any complaints. -- Continue triamterene 75-50 mg 0.5 tablets daily -- Continue verapamil to 40 mg daily -- Continue losartan 50 mg daily -- BMP check at next office visit

## 2021-07-11 NOTE — Patient Instructions (Addendum)
Thank you, Trevor Fischer for allowing Trevor Fischer to provide your care today. Today we discussed diabetes and blood pressure.  Continue to work on your diet and exercise weekly to help improve your blood sugar.  I have ordered the following labs for you:   Lab Orders         POC Hbg A1C      I will call if any are abnormal. All of your labs can be accessed through "My Chart".   I have ordered the following medication/changed the following medications:  Discontinue Jardiance 10 mg Start Jardiance 25 mg daily  My Chart Access: [email or web address]  Please follow-up in 3 months  Please make sure to arrive 15 minutes prior to your next appointment. If you arrive late, you may be asked to reschedule.    We look forward to seeing you next time. Please call our clinic at [phone] if you have any questions or concerns. The best time to call is Monday-Friday from 9am-4pm, but there is someone available 24/7. If after hours or the weekend, call the main hospital number and ask for the Internal Medicine Resident On-Call. If you need medication refills, please notify your pharmacy one week in advance and they will send Trevor Fischer a request.   Thank you for letting Trevor Fischer take part in your care. Wishing you the best!  Lacinda Axon, MD 07/11/2021, 4:08 PM IM Resident, PGY-2 [place]aldo Milian 41:10

## 2021-07-13 NOTE — Progress Notes (Signed)
Internal Medicine Clinic Attending  Case discussed with Dr. Amponsah  At the time of the visit.  We reviewed the resident's history and exam and pertinent patient test results.  I agree with the assessment, diagnosis, and plan of care documented in the resident's note.  

## 2021-07-20 ENCOUNTER — Encounter: Payer: Self-pay | Admitting: Vascular Surgery

## 2021-07-20 ENCOUNTER — Ambulatory Visit (HOSPITAL_COMMUNITY)
Admission: RE | Admit: 2021-07-20 | Discharge: 2021-07-20 | Disposition: A | Discharge status: Home / Self Care | Payer: 59 | Source: Ambulatory Visit | Attending: Vascular Surgery | Admitting: Vascular Surgery

## 2021-07-20 ENCOUNTER — Other Ambulatory Visit: Payer: Self-pay

## 2021-07-20 ENCOUNTER — Ambulatory Visit: Payer: 59 | Admitting: Vascular Surgery

## 2021-07-20 VITALS — BP 128/82 | HR 63 | Temp 98.1°F | Resp 18 | Ht 74.0 in | Wt 220.0 lb

## 2021-07-20 DIAGNOSIS — I872 Venous insufficiency (chronic) (peripheral): Secondary | ICD-10-CM | POA: Diagnosis not present

## 2021-07-20 DIAGNOSIS — M7989 Other specified soft tissue disorders: Secondary | ICD-10-CM

## 2021-07-20 NOTE — Progress Notes (Signed)
REASON FOR VISIT:   Follow-up of chronic venous insufficiency  MEDICAL ISSUES:   CHRONIC VENOUS INSUFFICIENCY: This patient has CEAP C4b venous disease.  This patient's venous symptoms are stable.  We have again discussed the importance of intermittent leg elevation and the proper positioning for this.  He will continue to wear his compression stockings.  We discussed the importance of exercise and trying to avoid prolonged sitting and standing.  In addition given that the winter is approaching I recommend that he keep his skin well lubricated.  If he develops progression of his disease or the development of a venous ulcer we could potentially consider addressing the reflux in the right great saphenous vein from the proximal calf to the distal thigh.  This segment is being fed by an incompetent perforator.  I plan on seeing him back in 1 year.  I have ordered a follow-up duplex scan at that time.  He knows to call sooner if he has problems.    HPI:   Trevor Fischer is a pleasant 60 y.o. male who I last saw on 04/01/2020.  He was referred with chronic venous insufficiency.  He had significant deep venous reflux and also superficial venous reflux involving the small saphenous vein.  His right great saphenous vein had been previously ablated.  We discussed the importance of intermittent leg elevation and fitted him for thigh-high compression stockings with a gradient of 20 to 30 mmHg.  I encouraged him to avoid prolonged sitting and standing.  We discussed importance of exercise.  If his symptoms progressed I thought he might be a candidate for endovenous laser ablation of the right small saphenous vein.  He did have a vein of Giacomini.  He comes in for a 1 year follow-up visit.  Since I saw him last, he continues to have some aching pain and heaviness in both legs but especially on the right side.  His symptoms are aggravated by sitting and standing and relieved with elevation.  His symptoms  are really not changed significantly over the last year.  He did have a DVT in the right lower extremity many years ago and later had a pulmonary embolus.  He is maintained on Eliquis.  He has been wearing his compression stockings and also elevating his legs.  There have been no other significant changes to his medical history.  Past Medical History:  Diagnosis Date   Allergic rhinitis    Degenerative joint disease of knee    DVT (deep venous thrombosis) (HCC) RLE X 2   Ended anticoagulation in 2012   Flatulence 05/13/2015   GERD (gastroesophageal reflux disease)    History of DVT of lower extremity 10/08   HIV infection ([place])    Hx MRSA infection    Hyperlipidemia    Hypertension    Pulmonary embolism ([place]) 05/14/2017   Superficial thrombophlebitis    Type II diabetes mellitus ([place]) 9/09    Family History  Problem Relation Age of Onset   Diabetes Father    Kidney disease Father    Heart failure Father    Hyperlipidemia Father    Hypertension Father    Osteoarthritis Mother     SOCIAL HISTORY: Social History   Tobacco Use   Smoking status: Former    Packs/day: 1.00    Years: 28.00    Pack years: 28.00    Types: Cigarettes    Quit date: 07/02/2005    Years since quitting: 16.0   Smokeless tobacco: Never  Substance Use Topics   Alcohol use: Yes    Alcohol/week: 0.0 standard drinks    Comment: 05/14/2017 "1-2 drinks/year"    Allergies  Allergen Reactions   Famotidine Other (See Comments)    Co-administration will lower complera levels   Amoxicillin-Pot Clavulanate Other (See Comments)   Amoxicillin-Pot Clavulanate Rash   Metformin And Related Diarrhea    Current Outpatient Medications  Medication Sig Dispense Refill   Accu-Chek FastClix Lancets MISC USE TO CHECK BLOOD SUGAR UP TO 4 TIMES A DAY 102 each PRN   ACCU-CHEK GUIDE test strip USE TO CHECK BLOOD SUGAR UP TO 4 TIMES A DAY 400 strip PRN   acetaminophen (TYLENOL) 500 MG tablet Take 2 tablets (1,000 mg  total) by mouth 2 (two) times daily. 30 tablet 3   Alcohol Swabs (EASY TOUCH ALCOHOL PREP MEDIUM) 70 % PADS Use as directed up to 10 daily 1000 each 3   apixaban (ELIQUIS) 5 MG TABS tablet Take 1 tablet (5 mg total) by mouth 2 (two) times daily. 180 tablet 2   atorvastatin (LIPITOR) 40 MG tablet Take 1 tablet (40 mg total) by mouth daily. 90 tablet 1   baclofen (LIORESAL) 20 MG tablet Take 1 tablet (20 mg total) by mouth 3 (three) times daily. 90 tablet 2   bictegravir-emtricitabine-tenofovir AF (BIKTARVY) 50-200-25 MG TABS tablet TAKE 1 TABLET BY MOUTH DAILY. 30 tablet 11   Blood Glucose Monitoring Suppl (FREESTYLE LITE) DEVI      chlorpheniramine (CHLOR-TRIMETON) 4 MG tablet Take 1 tablet (4 mg total) by mouth every 4 (four) hours as needed for allergies. 150 tablet 1   Continuous Blood Gluc Sensor (DEXCOM G6 SENSOR) MISC USE TO CHECK BLOOD SUGAR UP TO 10 TIMES A DAY 9 each 3   Continuous Blood Gluc Transmit (DEXCOM G6 TRANSMITTER) MISC USE TO CHECK BLOOD SUGAR UP TO 10 TIMES A DAY 1 each 3   COVID-19 mRNA Vac-TriS, Pfizer, (PFIZER-BIONT COVID-19 VAC-TRIS) SUSP injection Inject into the muscle. 0.3 mL 0   empagliflozin (JARDIANCE) 25 MG TABS tablet Take 1 tablet (25 mg total) by mouth daily before breakfast. 90 tablet 1   FREESTYLE LITE test strip      gabapentin (NEURONTIN) 800 MG tablet Take 1 tablet (800 mg total) by mouth 3 (three) times daily. 270 tablet 1   insulin aspart (NOVOLOG FLEXPEN) 100 UNIT/ML FlexPen Inject under the skin 3 times daily -10 units, 7 units, and 7 units. Increase dosage by 2 units for every 60mg  of glucose above goal. 15 mL 2   insulin degludec (TRESIBA FLEXTOUCH) 100 UNIT/ML FlexTouch Pen INJECT 25 UNITS TOTAL INTO THE SKIN DAILY 21 mL 3   insulin lispro (HUMALOG) 100 UNIT/ML KwikPen Inject 10 Units into the skin 4 (four) times daily. 18 mL 5   Insulin Pen Needle 32G X 4 MM MISC Use as directed 6 times daily 100 each 8   Lancets (FREESTYLE) lancets USE TO CHECK BLOOD  SUGAR UP TO 4 TIMES A DAY 100 each 99   liraglutide (VICTOZA) 18 MG/3ML SOPN INJECT 1.8 MG INTO THE SKIN DAILY. 9 mL 3   losartan (COZAAR) 50 MG tablet Take 1 tablet (50 mg total) by mouth daily. 90 tablet 3   Omega-3 Fatty Acids (FISH OIL) 1000 MG CAPS Take 1 capsule by mouth 2 (two) times daily.     pantoprazole (PROTONIX) 40 MG tablet Take 1 tablet (40 mg total) by mouth daily. 30 tablet 3   sildenafil (VIAGRA) 50 MG tablet Take 1  tablet (50 mg total) by mouth daily as needed for erectile dysfunction. 30 tablet 0   Simethicone 180 MG CAPS Take 1 capsule (180 mg total) by mouth 3 (three) times daily as needed. 60 capsule 3   triamterene-hydrochlorothiazide (MAXZIDE) 75-50 MG tablet Take 0.5 tablets by mouth daily. 45 tablet 1   verapamil (CALAN-SR) 240 MG CR tablet Take 1 tablet (240 mg total) by mouth daily. 90 tablet 1   No current facility-administered medications for this visit.    REVIEW OF SYSTEMS:  [X]  denotes positive finding, [ ]  denotes negative finding Cardiac  Comments:  Chest pain or chest pressure:    Shortness of breath upon exertion:    Short of breath when lying flat:    Irregular heart rhythm:        Vascular    Pain in calf, thigh, or hip brought on by ambulation:    Pain in feet at night that wakes you up from your sleep:     Blood clot in your veins:    Leg swelling:  x       Pulmonary    Oxygen at home:    Productive cough:     Wheezing:         Neurologic    Sudden weakness in arms or legs:     Sudden numbness in arms or legs:     Sudden onset of difficulty speaking or slurred speech:    Temporary loss of vision in one eye:     Problems with dizziness:         Gastrointestinal    Blood in stool:     Vomited blood:         Genitourinary    Burning when urinating:     Blood in urine:        Psychiatric    Major depression:         Hematologic    Bleeding problems:    Problems with blood clotting too easily:        Skin    Rashes or ulcers:         Constitutional    Fever or chills:     PHYSICAL EXAM:   Vitals:   07/20/21 1552  BP: 128/82  Pulse: 63  Resp: 18  Temp: 98.1 F (36.7 C)  TempSrc: Temporal  SpO2: 97%  Weight: 220 lb (99.8 kg)  Height: 6\' 2"  (1.88 m)    GENERAL: The patient is a well-nourished male, in no acute distress. The vital signs are documented above. CARDIAC: There is a regular rate and rhythm.  VASCULAR: I do not detect carotid bruits. He has palpable pedal pulses. He has hyperpigmentation in both lower extremities but more so on the right.    I did look at the right great saphenous vein myself with the SonoSite.  There is reflux from the distal thigh to the proximal calf and I think this segment of the great saphenous vein is being fed by an incompetent perforator.  By my measurement the vein here was dilated up to 5 mm. PULMONARY: There is good air exchange bilaterally without wheezing or rales. ABDOMEN: Soft and non-tender with normal pitched bowel sounds.  MUSCULOSKELETAL: There are no major deformities or cyanosis. NEUROLOGIC: No focal weakness or paresthesias are detected. SKIN: There are no ulcers or rashes noted. PSYCHIATRIC: The patient has a normal affect.  DATA:    VENOUS DUPLEX: I have independently interpreted his venous duplex scan of the right lower  extremity.  There was no evidence of DVT.  There is deep venous reflux involving the common femoral vein, femoral vein, and popliteal vein.  There was reflux in the superficial system involving the saphenofemoral junction and proximal thigh.  The vein was 3.4 mm in the proximal thigh.  There was a segment of reflux from the knee to the proximal calf and the great saphenous vein with diameters of the vein ranging from 4.2-4.5 mm.  There was a segment of reflux in the small saphenous vein in the mid calf and the vein was dilated to 5 mm.       Deitra Mayo [facility]  [phone]

## 2021-07-25 ENCOUNTER — Other Ambulatory Visit (HOSPITAL_COMMUNITY): Payer: Self-pay

## 2021-07-25 ENCOUNTER — Other Ambulatory Visit: Payer: Self-pay

## 2021-07-26 ENCOUNTER — Other Ambulatory Visit (HOSPITAL_COMMUNITY): Payer: Self-pay

## 2021-07-26 ENCOUNTER — Other Ambulatory Visit: Payer: Self-pay | Admitting: Internal Medicine

## 2021-07-26 DIAGNOSIS — I10 Essential (primary) hypertension: Secondary | ICD-10-CM

## 2021-07-26 DIAGNOSIS — K219 Gastro-esophageal reflux disease without esophagitis: Secondary | ICD-10-CM

## 2021-07-26 DIAGNOSIS — E1140 Type 2 diabetes mellitus with diabetic neuropathy, unspecified: Secondary | ICD-10-CM

## 2021-07-27 ENCOUNTER — Other Ambulatory Visit (HOSPITAL_COMMUNITY): Payer: Self-pay

## 2021-07-27 ENCOUNTER — Other Ambulatory Visit: Payer: Self-pay | Admitting: Student

## 2021-07-27 DIAGNOSIS — K219 Gastro-esophageal reflux disease without esophagitis: Secondary | ICD-10-CM

## 2021-07-27 DIAGNOSIS — Z794 Long term (current) use of insulin: Secondary | ICD-10-CM

## 2021-07-27 DIAGNOSIS — I10 Essential (primary) hypertension: Secondary | ICD-10-CM

## 2021-07-27 MED ORDER — VICTOZA 18 MG/3ML ~~LOC~~ SOPN
1.8000 mg | PEN_INJECTOR | Freq: Every day | SUBCUTANEOUS | 3 refills | Status: DC
  Filled 2021-07-27: qty 27, 90d supply, fill #0
  Filled 2021-11-07: qty 9, 30d supply, fill #1

## 2021-07-27 MED ORDER — PANTOPRAZOLE SODIUM 40 MG PO TBEC
40.0000 mg | DELAYED_RELEASE_TABLET | Freq: Every day | ORAL | 3 refills | Status: DC
Start: 2021-07-27 — End: 2021-11-17
  Filled 2021-07-27: qty 90, 90d supply, fill #0
  Filled 2021-10-27: qty 30, 30d supply, fill #1

## 2021-07-27 MED ORDER — TRIAMTERENE-HCTZ 75-50 MG PO TABS
0.5000 | ORAL_TABLET | Freq: Every day | ORAL | 1 refills | Status: DC
  Filled 2021-07-27: qty 45, 90d supply, fill #0
  Filled 2021-11-01: qty 45, 90d supply, fill #1

## 2021-07-27 NOTE — Telephone Encounter (Addendum)
These refill requests were sent from [facility] yesterday at 1523 and were sent to PCP at 1026 today. The Rxs were cancelled from earlier request (not sure how) and new request was sent  today at 1223 stating it was 3rd attempt. Patient's husband called in very upset stating Pharmacy told him they had been requesting these refill since last week. Spoke with Manuela Schwartz at [facility] and states a new request is sent every time patient calls in or sends request through [facility]. She states first attempt was made on 10/24. In Epic there is a "Pharmacy Visit" on 10/24 with diagnoses but no med requests.   Will forward to PCP to address med refills and to [place], Janit Bern for help with this as this is not an isolated instance.

## 2021-07-27 NOTE — Progress Notes (Signed)
Received RX request for patient for liraglutide, pantoprazole, and triamterene-hydrochlorothiazide. This request was sent to me today, 07/27/2021. Per chart review, triage RN discussed with patient's husband earlier today, who was upset that the prescriptions have not been sent in yet. Per patient's husband and pharmacy, the initial request was sent to our clinic last week. However, our clinic has not received any RX request until today. Similar instances have been occurring between different pharmacies and our clinic for a few months now, seemingly since Epic was recently updated in the summer. Having prescriptions not filled in a timely manner can be dangerous to patient care and creates distrust between providers and patients. RN has Product/process development scientist for further assistance. I will also forward to the Internal Medicine Clinic Director and place a safety zone event in order to hopefully resolve the this issue swiftly.   - RX refills for liraglutide, pantoprazole, and triamterene-hydrochlorothiazide have been sent - Issue has been forwarded to [place], [facility] director, and safety zone - Will also send e-mail to IT  Sanjuan Dame, MD Internal Medicine PGY-2 [phone]

## 2021-07-28 ENCOUNTER — Other Ambulatory Visit (HOSPITAL_COMMUNITY): Payer: Self-pay

## 2021-07-29 ENCOUNTER — Other Ambulatory Visit (HOSPITAL_COMMUNITY): Payer: Self-pay

## 2021-08-02 ENCOUNTER — Ambulatory Visit (INDEPENDENT_AMBULATORY_CARE_PROVIDER_SITE_OTHER): Payer: 59

## 2021-08-02 ENCOUNTER — Other Ambulatory Visit: Payer: Self-pay

## 2021-08-02 DIAGNOSIS — Z23 Encounter for immunization: Secondary | ICD-10-CM | POA: Diagnosis not present

## 2021-08-02 NOTE — Progress Notes (Signed)
   Covid-19 Vaccination Clinic  Name:  MALIKI GIGNAC    MRN: [id number] DOB: [birth year]  08/02/2021  Mr. Bramer was observed post Covid-19 immunization for 15 minutes without incident. He was provided with Vaccine Information Sheet and instruction to access the V-Safe system.   Mr. Lyerly was instructed to call 911 with any severe reactions post vaccine: Difficulty breathing  Swelling of face and throat  A fast heartbeat  A bad rash all over body  Dizziness and weakness   Immunizations Administered     Name Date Dose VIS Date Route   Pfizer Covid-19 Vaccine Bivalent Booster 08/02/2021  9:15 AM 0.3 mL 06/01/2021 Intramuscular   Manufacturer: [place]   Lot: [place]   NDC: [place]

## 2021-08-04 ENCOUNTER — Other Ambulatory Visit (HOSPITAL_COMMUNITY): Payer: Self-pay

## 2021-08-04 MED FILL — Bictegravir-Emtricitabine-Tenofovir AF Tab 50-200-25 MG: ORAL | 30 days supply | Qty: 30 | Fill #6 | Status: AC

## 2021-08-09 ENCOUNTER — Other Ambulatory Visit (HOSPITAL_COMMUNITY): Payer: Self-pay

## 2021-08-15 ENCOUNTER — Other Ambulatory Visit (HOSPITAL_COMMUNITY): Payer: Self-pay

## 2021-08-18 ENCOUNTER — Other Ambulatory Visit (HOSPITAL_COMMUNITY): Payer: Self-pay

## 2021-08-22 ENCOUNTER — Other Ambulatory Visit (HOSPITAL_COMMUNITY): Payer: Self-pay

## 2021-08-24 ENCOUNTER — Other Ambulatory Visit (HOSPITAL_COMMUNITY): Payer: Self-pay

## 2021-09-01 ENCOUNTER — Other Ambulatory Visit: Payer: Self-pay

## 2021-09-05 ENCOUNTER — Other Ambulatory Visit: Payer: Self-pay | Admitting: Internal Medicine

## 2021-09-05 ENCOUNTER — Other Ambulatory Visit (HOSPITAL_COMMUNITY): Payer: Self-pay

## 2021-09-05 DIAGNOSIS — E1140 Type 2 diabetes mellitus with diabetic neuropathy, unspecified: Secondary | ICD-10-CM

## 2021-09-06 ENCOUNTER — Other Ambulatory Visit (HOSPITAL_COMMUNITY): Payer: Self-pay

## 2021-09-07 ENCOUNTER — Other Ambulatory Visit (HOSPITAL_COMMUNITY): Payer: Self-pay

## 2021-09-07 ENCOUNTER — Telehealth: Payer: Self-pay | Admitting: *Deleted

## 2021-09-07 MED ORDER — GABAPENTIN 800 MG PO TABS
800.0000 mg | ORAL_TABLET | Freq: Three times a day (TID) | ORAL | 1 refills | Status: DC
  Filled 2021-09-07: qty 270, 90d supply, fill #0
  Filled 2021-12-22: qty 270, 90d supply, fill #1

## 2021-09-07 NOTE — Telephone Encounter (Addendum)
Call from patient's significant other c/o prescription for Gabapentin has not been refilled since requested on 09/01/2021.  [facility] has had problems getting prescriptions filled in the past as well.  Message and request sent to PCP to refill patients Gabapentin.    RTC to patient to inform him that Refill for Gabapentin has been done and check Pharmacy to see when he can pick it up.   Patient very appreciative.  [name] [name],RN 09/07/2021 11:19 AM.

## 2021-09-08 ENCOUNTER — Other Ambulatory Visit (HOSPITAL_COMMUNITY): Payer: Self-pay

## 2021-09-12 ENCOUNTER — Other Ambulatory Visit (HOSPITAL_COMMUNITY): Payer: Self-pay

## 2021-09-12 MED FILL — Bictegravir-Emtricitabine-Tenofovir AF Tab 50-200-25 MG: ORAL | 30 days supply | Qty: 30 | Fill #7 | Status: AC

## 2021-09-16 ENCOUNTER — Other Ambulatory Visit: Payer: Self-pay

## 2021-09-16 ENCOUNTER — Other Ambulatory Visit: Payer: Self-pay | Admitting: Internal Medicine

## 2021-09-16 ENCOUNTER — Other Ambulatory Visit (HOSPITAL_COMMUNITY): Payer: Self-pay

## 2021-09-16 DIAGNOSIS — I10 Essential (primary) hypertension: Secondary | ICD-10-CM

## 2021-09-16 MED ORDER — VERAPAMIL HCL ER 240 MG PO TBCR
240.0000 mg | EXTENDED_RELEASE_TABLET | Freq: Every day | ORAL | 1 refills | Status: DC
  Filled 2021-09-16: qty 90, 90d supply, fill #0
  Filled 2021-12-22: qty 90, 90d supply, fill #1

## 2021-09-16 NOTE — Telephone Encounter (Signed)
Next appt scheduled 09/21/21 with PCP.

## 2021-09-19 ENCOUNTER — Other Ambulatory Visit (HOSPITAL_COMMUNITY): Payer: Self-pay

## 2021-09-21 ENCOUNTER — Other Ambulatory Visit: Payer: Self-pay

## 2021-09-21 ENCOUNTER — Encounter: Payer: Self-pay | Admitting: Student

## 2021-09-21 ENCOUNTER — Ambulatory Visit (INDEPENDENT_AMBULATORY_CARE_PROVIDER_SITE_OTHER): Payer: 59 | Admitting: Student

## 2021-09-21 ENCOUNTER — Other Ambulatory Visit (HOSPITAL_COMMUNITY): Payer: Self-pay

## 2021-09-21 VITALS — BP 125/59 | HR 62 | Temp 97.8°F | Ht 74.0 in | Wt 221.3 lb

## 2021-09-21 DIAGNOSIS — I10 Essential (primary) hypertension: Secondary | ICD-10-CM | POA: Diagnosis not present

## 2021-09-21 DIAGNOSIS — E1140 Type 2 diabetes mellitus with diabetic neuropathy, unspecified: Secondary | ICD-10-CM

## 2021-09-21 DIAGNOSIS — Z23 Encounter for immunization: Secondary | ICD-10-CM

## 2021-09-21 DIAGNOSIS — Z794 Long term (current) use of insulin: Secondary | ICD-10-CM

## 2021-09-21 LAB — POCT GLYCOSYLATED HEMOGLOBIN (HGB A1C): Hemoglobin A1C: 6.8 % — AB (ref 4.0–5.6)

## 2021-09-21 LAB — GLUCOSE, CAPILLARY: Glucose-Capillary: 85 mg/dL (ref 70–99)

## 2021-09-21 NOTE — Patient Instructions (Signed)
Trevor Fischer, it was a pleasure seeing you today!  Today we discussed: - Your A1c looked great today, down to 6.8%! No medication changes today.   I have ordered the following labs today:  Lab Orders         Glucose, capillary         BMP8+Anion Gap         POC Hbg A1C       Follow-up: 3 months   Please make sure to arrive 15 minutes prior to your next appointment. If you arrive late, you may be asked to reschedule.   We look forward to seeing you next time. Please call our clinic at [phone] if you have any questions or concerns. The best time to call is Monday-Friday from 9am-4pm, but there is someone available 24/7. If after hours or the weekend, call the main hospital number and ask for the Internal Medicine Resident On-Call. If you need medication refills, please notify your pharmacy one week in advance and they will send [name] a request.  Thank you for letting us take part in your care. Wishing you the best!  Thank you, Sanjuan Dame, MD

## 2021-09-21 NOTE — Assessment & Plan Note (Signed)
BP Readings from Last 3 Encounters:  09/21/21 (!) 125/59  07/20/21 128/82  07/11/21 120/70   Initial BP today 144/68, r/p after resting 125/59. Patient has been doing well on long-term antihypertensives. Denies chest pain, dyspnea.   - Triamterene 75-50mg  1/2 tablets daily - Verapamil 40mg  daily - Losartan 50mg  daily - BMET today

## 2021-09-21 NOTE — Assessment & Plan Note (Signed)
Patient is here for diabetes follow-up. He mentions he has not eaten as well lately with the holidays. Otherwise reports compliance with his medications and checks his sugars regularly. Usually sugars range in 100-200's. Occasional glucose ~60, but he reports this is very rare for him. We will continue with current regimen today. If hypoglycemic episodes become more frequent would decrease long-acting insulin.  - [name] 25 units daily - Humalog 10-7-7 units with meals - Victoza 1.8mg  daily - Jardiance 25mg  daily - Repeat A1c in three months - Has scheduled upcoming eye exam

## 2021-09-21 NOTE — Progress Notes (Signed)
° °  CC: diabetes follow-up  HPI:  [name] is a 60 y.o. person with type II diabetes mellitus, hypertension, well-controlled HIV presenting to [facility] for diabetes follow-up.  Please see problem-based list for further details, assessments, and plans.  Past Medical History:  Diagnosis Date   Allergic rhinitis    Degenerative joint disease of knee    DVT (deep venous thrombosis) (HCC) RLE X 2   Ended anticoagulation in 2012   Flatulence 05/13/2015   GERD (gastroesophageal reflux disease)    History of DVT of lower extremity 10/08   HIV infection ([place])    Hx MRSA infection    Hyperlipidemia    Hypertension    Pulmonary embolism ([place]) 05/14/2017   Superficial thrombophlebitis    Type II diabetes mellitus ([place]) 9/09   Review of Systems:  As per HPI  Physical Exam:  Vitals:   09/21/21 1401 09/21/21 1454  BP: (!) 144/68 (!) 125/59  Pulse: 62   Temp: 97.8 F (36.6 C)   TempSrc: Oral   SpO2: 100%   Weight: 221 lb 4.8 oz (100.4 kg)   Height: 6\' 2"  (1.88 m)    General: Resting comfortably in chair, no acute distress CV: Regular rate, rhythm. No murmurs Pulm: Normal work of breathing on room air. Clear to ausculation bilaterally. MSK: Normal bulk, tone. No peripheral edema. Neuro: Awake, alert, conversing appropriately. Psych: Normal mood, affect, speech.  Assessment & Plan:   See Encounters Tab for problem based charting.  Patient discussed with Dr. Daryll Drown

## 2021-09-22 LAB — BMP8+ANION GAP
Anion Gap: 15 mmol/L (ref 10.0–18.0)
BUN/Creatinine Ratio: 12 (ref 10–24)
BUN: 15 mg/dL (ref 8–27)
CO2: 24 mmol/L (ref 20–29)
Calcium: 9.6 mg/dL (ref 8.6–10.2)
Chloride: 100 mmol/L (ref 96–106)
Creatinine, Ser: 1.22 mg/dL (ref 0.76–1.27)
Glucose: 86 mg/dL (ref 70–99)
Potassium: 4.3 mmol/L (ref 3.5–5.2)
Sodium: 139 mmol/L (ref 134–144)
eGFR: 67 mL/min/{1.73_m2} (ref >59)

## 2021-09-27 DIAGNOSIS — E119 Type 2 diabetes mellitus without complications: Secondary | ICD-10-CM | POA: Diagnosis not present

## 2021-09-27 LAB — HM DIABETES EYE EXAM

## 2021-09-28 ENCOUNTER — Other Ambulatory Visit: Payer: Self-pay | Admitting: Student

## 2021-09-28 ENCOUNTER — Other Ambulatory Visit (HOSPITAL_COMMUNITY): Payer: Self-pay

## 2021-09-28 ENCOUNTER — Other Ambulatory Visit: Payer: Self-pay

## 2021-09-28 DIAGNOSIS — M5441 Lumbago with sciatica, right side: Secondary | ICD-10-CM

## 2021-09-28 MED ORDER — BACLOFEN 20 MG PO TABS
20.0000 mg | ORAL_TABLET | Freq: Three times a day (TID) | ORAL | 2 refills | Status: DC
  Filled 2021-09-28: qty 90, 30d supply, fill #0
  Filled 2021-11-01: qty 90, 30d supply, fill #1
  Filled 2021-12-22: qty 90, 30d supply, fill #2

## 2021-09-28 NOTE — Progress Notes (Signed)
Internal Medicine Clinic Attending ? ?Case discussed with Dr. Braswell  at the time of the visit.  We reviewed the resident?s history and exam and pertinent patient test results.  I agree with the assessment, diagnosis, and plan of care documented in the resident?s note.  ?

## 2021-09-29 ENCOUNTER — Other Ambulatory Visit: Payer: Self-pay | Admitting: *Deleted

## 2021-09-29 ENCOUNTER — Other Ambulatory Visit (HOSPITAL_COMMUNITY): Payer: Self-pay

## 2021-09-29 DIAGNOSIS — E1140 Type 2 diabetes mellitus with diabetic neuropathy, unspecified: Secondary | ICD-10-CM

## 2021-09-29 NOTE — Telephone Encounter (Signed)
Resending request for Humalog. Different directions then previous prescription.

## 2021-09-30 ENCOUNTER — Other Ambulatory Visit (HOSPITAL_COMMUNITY): Payer: Self-pay

## 2021-09-30 ENCOUNTER — Other Ambulatory Visit: Payer: Self-pay | Admitting: Internal Medicine

## 2021-09-30 DIAGNOSIS — E1140 Type 2 diabetes mellitus with diabetic neuropathy, unspecified: Secondary | ICD-10-CM

## 2021-09-30 MED ORDER — INSULIN LISPRO (1 UNIT DIAL) 100 UNIT/ML (KWIKPEN)
10.0000 [IU] | PEN_INJECTOR | Freq: Four times a day (QID) | SUBCUTANEOUS | 5 refills | Status: DC
  Filled 2021-09-30: qty 36, 90d supply, fill #0

## 2021-10-03 ENCOUNTER — Other Ambulatory Visit (HOSPITAL_COMMUNITY): Payer: Self-pay

## 2021-10-04 ENCOUNTER — Telehealth: Payer: Self-pay

## 2021-10-04 ENCOUNTER — Other Ambulatory Visit (HOSPITAL_COMMUNITY): Payer: Self-pay

## 2021-10-04 MED ORDER — ATORVASTATIN CALCIUM 40 MG PO TABS
40.0000 mg | ORAL_TABLET | Freq: Every day | ORAL | 1 refills | Status: DC
  Filled 2021-10-04: qty 90, 90d supply, fill #0
  Filled 2022-01-19: qty 90, 90d supply, fill #1

## 2021-10-04 MED FILL — Bictegravir-Emtricitabine-Tenofovir AF Tab 50-200-25 MG: ORAL | 30 days supply | Qty: 30 | Fill #8 | Status: AC

## 2021-10-04 NOTE — Telephone Encounter (Signed)
atorvastatin (LIPITOR) 40 MG tablet, refill request @ [facility].  Requesting medication to be filled by today.

## 2021-10-04 NOTE — Telephone Encounter (Signed)
Pt called / informed Atorvastatin was refilled today.

## 2021-10-06 ENCOUNTER — Other Ambulatory Visit (HOSPITAL_COMMUNITY): Payer: Self-pay

## 2021-10-11 ENCOUNTER — Other Ambulatory Visit (HOSPITAL_COMMUNITY): Payer: Self-pay

## 2021-10-17 ENCOUNTER — Other Ambulatory Visit (HOSPITAL_COMMUNITY): Payer: Self-pay

## 2021-10-18 ENCOUNTER — Telehealth: Payer: Self-pay | Admitting: Dietician

## 2021-10-18 NOTE — Telephone Encounter (Signed)
Provided Continuous glucose monitoring Support.

## 2021-10-27 ENCOUNTER — Other Ambulatory Visit (HOSPITAL_COMMUNITY): Payer: Self-pay

## 2021-10-31 ENCOUNTER — Other Ambulatory Visit (HOSPITAL_COMMUNITY): Payer: Self-pay

## 2021-10-31 MED FILL — Bictegravir-Emtricitabine-Tenofovir AF Tab 50-200-25 MG: ORAL | 30 days supply | Qty: 30 | Fill #9 | Status: AC

## 2021-11-01 ENCOUNTER — Other Ambulatory Visit (HOSPITAL_COMMUNITY): Payer: Self-pay

## 2021-11-03 ENCOUNTER — Other Ambulatory Visit: Payer: Self-pay

## 2021-11-03 ENCOUNTER — Other Ambulatory Visit: Payer: 59

## 2021-11-03 DIAGNOSIS — B20 Human immunodeficiency virus [HIV] disease: Secondary | ICD-10-CM

## 2021-11-07 ENCOUNTER — Other Ambulatory Visit (HOSPITAL_COMMUNITY): Payer: Self-pay

## 2021-11-07 LAB — COMPLETE METABOLIC PANEL WITH GFR
AG Ratio: 1.8 (calc) (ref 1.0–2.5)
ALT: 41 U/L (ref 9–46)
AST: 35 U/L (ref 10–35)
Albumin: 4.6 g/dL (ref 3.6–5.1)
Alkaline phosphatase (APISO): 100 U/L (ref 35–144)
BUN: 19 mg/dL (ref 7–25)
CO2: 30 mmol/L (ref 20–32)
Calcium: 9.6 mg/dL (ref 8.6–10.3)
Chloride: 101 mmol/L (ref 98–110)
Creat: 1.3 mg/dL (ref 0.70–1.35)
Globulin: 2.6 g/dL (calc) (ref 1.9–3.7)
Glucose, Bld: 164 mg/dL — ABNORMAL HIGH (ref 65–99)
Potassium: 4.2 mmol/L (ref 3.5–5.3)
Sodium: 139 mmol/L (ref 135–146)
Total Bilirubin: 0.5 mg/dL (ref 0.2–1.2)
Total Protein: 7.2 g/dL (ref 6.1–8.1)
eGFR: 62 mL/min/{1.73_m2} (ref >=60)

## 2021-11-07 LAB — CBC WITH DIFFERENTIAL/PLATELET
Absolute Monocytes: 361 cells/uL (ref 200–950)
Basophils Absolute: 41 cells/uL (ref 0–200)
Basophils Relative: 1 %
Eosinophils Absolute: 90 cells/uL (ref 15–500)
Eosinophils Relative: 2.2 %
HCT: 46.6 % (ref 38.5–50.0)
Hemoglobin: 14.9 g/dL (ref 13.2–17.1)
Lymphs Abs: 1574 cells/uL (ref 850–3900)
MCH: 24.5 pg — ABNORMAL LOW (ref 27.0–33.0)
MCHC: 32 g/dL (ref 32.0–36.0)
MCV: 76.8 fL — ABNORMAL LOW (ref 80.0–100.0)
MPV: 9.4 fL (ref 7.5–12.5)
Monocytes Relative: 8.8 %
Neutro Abs: 2034 cells/uL (ref 1500–7800)
Neutrophils Relative %: 49.6 %
Platelets: 261 10*3/uL (ref 140–400)
RBC: 6.07 10*6/uL — ABNORMAL HIGH (ref 4.20–5.80)
RDW: 14.5 % (ref 11.0–15.0)
Total Lymphocyte: 38.4 %
WBC: 4.1 10*3/uL (ref 3.8–10.8)

## 2021-11-07 LAB — LIPID PANEL
Cholesterol: 160 mg/dL (ref <200)
HDL: 42 mg/dL (ref >=40)
LDL Cholesterol (Calc): 97 mg/dL (calc)
Non-HDL Cholesterol (Calc): 118 mg/dL (calc) (ref <130)
Total CHOL/HDL Ratio: 3.8 (calc) (ref <5.0)
Triglycerides: 118 mg/dL (ref <150)

## 2021-11-07 LAB — T-HELPER CELLS (CD4) COUNT (NOT AT ARMC)
Absolute CD4: 619 cells/uL (ref 490–1740)
CD4 T Helper %: 37 % (ref 30–61)
Total lymphocyte count: 1660 cells/uL (ref 850–3900)

## 2021-11-07 LAB — RPR: RPR Ser Ql: NONREACTIVE

## 2021-11-07 LAB — HIV-1 RNA QUANT-NO REFLEX-BLD
HIV 1 RNA Quant: NOT DETECTED Copies/mL
HIV-1 RNA Quant, Log: NOT DETECTED Log cps/mL

## 2021-11-17 ENCOUNTER — Other Ambulatory Visit: Payer: Self-pay | Admitting: Student

## 2021-11-17 ENCOUNTER — Other Ambulatory Visit (HOSPITAL_COMMUNITY): Payer: Self-pay

## 2021-11-17 ENCOUNTER — Other Ambulatory Visit: Payer: Self-pay

## 2021-11-17 ENCOUNTER — Encounter: Payer: Self-pay | Admitting: Infectious Disease

## 2021-11-17 ENCOUNTER — Ambulatory Visit: Payer: 59 | Admitting: Infectious Disease

## 2021-11-17 VITALS — BP 146/81 | HR 71 | Temp 98.1°F | Wt 220.0 lb

## 2021-11-17 DIAGNOSIS — E785 Hyperlipidemia, unspecified: Secondary | ICD-10-CM

## 2021-11-17 DIAGNOSIS — K219 Gastro-esophageal reflux disease without esophagitis: Secondary | ICD-10-CM

## 2021-11-17 DIAGNOSIS — I10 Essential (primary) hypertension: Secondary | ICD-10-CM | POA: Diagnosis not present

## 2021-11-17 DIAGNOSIS — Z7185 Encounter for immunization safety counseling: Secondary | ICD-10-CM

## 2021-11-17 DIAGNOSIS — B20 Human immunodeficiency virus [HIV] disease: Secondary | ICD-10-CM | POA: Diagnosis not present

## 2021-11-17 DIAGNOSIS — D6859 Other primary thrombophilia: Secondary | ICD-10-CM

## 2021-11-17 DIAGNOSIS — E1140 Type 2 diabetes mellitus with diabetic neuropathy, unspecified: Secondary | ICD-10-CM

## 2021-11-17 DIAGNOSIS — Z23 Encounter for immunization: Secondary | ICD-10-CM

## 2021-11-17 DIAGNOSIS — Z794 Long term (current) use of insulin: Secondary | ICD-10-CM

## 2021-11-17 HISTORY — DX: Hyperlipidemia, unspecified: E78.5

## 2021-11-17 MED ORDER — BICTEGRAVIR-EMTRICITAB-TENOFOV 50-200-25 MG PO TABS
1.0000 | ORAL_TABLET | Freq: Every day | ORAL | 11 refills | Status: DC
  Filled 2021-11-17 – 2021-11-28 (×2): qty 30, 30d supply, fill #0
  Filled 2021-12-23: qty 30, 30d supply, fill #1
  Filled 2022-01-24: qty 30, 30d supply, fill #2
  Filled 2022-02-21: qty 30, 30d supply, fill #3
  Filled 2022-03-20: qty 30, 30d supply, fill #4
  Filled 2022-04-13: qty 30, 30d supply, fill #5
  Filled 2022-06-09: qty 30, 30d supply, fill #6
  Filled 2022-07-12: qty 30, 30d supply, fill #7
  Filled 2022-08-10: qty 30, 30d supply, fill #8
  Filled 2022-09-04 – 2022-09-13 (×2): qty 30, 30d supply, fill #9
  Filled 2022-10-06: qty 30, 30d supply, fill #10
  Filled 2022-10-31: qty 30, 30d supply, fill #11

## 2021-11-17 NOTE — Progress Notes (Signed)
Chief complaint: Follow-up for HIV disease on Biktarvy Subjective:    Patient ID: Trevor Fischer, male    DOB: 12-26-1960, 61 y.o.   MRN: [id number]  HPI Trevor Fischer is a 61 y.o. male who continues to do well on BIKTARVY with  undetectable viral load and health cd4 count.   [name] is had his 3 COVID-19 vaccinations from [employer].  [name] came today with his husband the clinic.    Has no new complaints.  He continues be quite happy with the care he receives internal medicine clinic.  He was eligible for Prevnar 20 which we gave him today.  Past Medical History:  Diagnosis Date   Allergic rhinitis    Degenerative joint disease of knee    DVT (deep venous thrombosis) (HCC) RLE X 2   Ended anticoagulation in 2012   Flatulence 05/13/2015   GERD (gastroesophageal reflux disease)    History of DVT of lower extremity 10/08   HIV infection (HCC)    Hx MRSA infection    Hyperlipidemia    Hypertension    Pulmonary embolism ([place]) 05/14/2017   Superficial thrombophlebitis    Type II diabetes mellitus ([place]) 9/09    Past Surgical History:  Procedure Laterality Date   ENDOVENOUS ABLATION SAPHENOUS VEIN W/ LASER  07-17-2011 LEFT GRERATER SAPHENOUS VEIN AND STAB PHLEBECTOMIES   10-20   LEFT LEG   VEIN LIGATION AND STRIPPING Bilateral     Family History  Problem Relation Age of Onset   Diabetes Father    Kidney disease Father    Heart failure Father    Hyperlipidemia Father    Hypertension Father    Osteoarthritis Mother       Social History   Socioeconomic History   Marital status: Married    Spouse name: Not on file   Number of children: Not on file   Years of education: Not on file   Highest education level: Not on file  Occupational History   Not on file  Tobacco Use   Smoking status: Former    Packs/day: 1.00    Years: 28.00    Pack years: 28.00    Types: Cigarettes    Quit date: 07/02/2005    Years since quitting: 16.3   Smokeless tobacco: Never   Vaping Use   Vaping Use: Never used  Substance and Sexual Activity   Alcohol use: Yes    Alcohol/week: 0.0 standard drinks    Comment: 05/14/2017 "1-2 drinks/year"   Drug use: No   Sexual activity: Not Currently    Partners: Male    Comment: declined condoms  Other Topics Concern   Not on file  Social History Narrative   Lives in [place], with partner of 33 years    Works as Chartered certified accountant at [employer]   Has [employer]   Social Determinants of Health   Financial Resource Strain: Not on file  Food Insecurity: Not on file  Transportation Needs: Not on file  Physical Activity: Not on file  Stress: Not on file  Social Connections: Not on file    Allergies  Allergen Reactions   Famotidine Other (See Comments)    Co-administration will lower complera levels   Amoxicillin-Pot Clavulanate Other (See Comments)   Amoxicillin-Pot Clavulanate Rash   Metformin And Related Diarrhea     Current Outpatient Medications:    Accu-Chek FastClix Lancets MISC, USE TO CHECK BLOOD SUGAR UP TO 4 TIMES A DAY, Disp: 102 each, Rfl: PRN  ACCU-CHEK GUIDE test strip, USE TO CHECK BLOOD SUGAR UP TO 4 TIMES A DAY, Disp: 400 strip, Rfl: PRN   acetaminophen (TYLENOL) 500 MG tablet, Take 2 tablets (1,000 mg total) by mouth 2 (two) times daily., Disp: 30 tablet, Rfl: 3   Alcohol Swabs (EASY TOUCH ALCOHOL PREP MEDIUM) 70 % PADS, Use as directed up to 10 daily, Disp: 1000 each, Rfl: 3   apixaban (ELIQUIS) 5 MG TABS tablet, Take 1 tablet (5 mg total) by mouth 2 (two) times daily., Disp: 180 tablet, Rfl: 2   atorvastatin (LIPITOR) 40 MG tablet, Take 1 tablet (40 mg total) by mouth daily., Disp: 90 tablet, Rfl: 1   baclofen (LIORESAL) 20 MG tablet, Take 1 tablet (20 mg total) by mouth 3 (three) times daily., Disp: 90 tablet, Rfl: 2   bictegravir-emtricitabine-tenofovir AF (BIKTARVY) 50-200-25 MG TABS tablet, TAKE 1 TABLET BY MOUTH DAILY., Disp: 30 tablet, Rfl: 11   Blood Glucose Monitoring Suppl  (FREESTYLE LITE) DEVI, , Disp: , Rfl:    chlorpheniramine (CHLOR-TRIMETON) 4 MG tablet, Take 1 tablet (4 mg total) by mouth every 4 (four) hours as needed for allergies., Disp: 150 tablet, Rfl: 1   Continuous Blood Gluc Sensor (DEXCOM G6 SENSOR) MISC, USE TO CHECK BLOOD SUGAR UP TO 10 TIMES A DAY, Disp: 9 each, Rfl: 3   Continuous Blood Gluc Transmit (DEXCOM G6 TRANSMITTER) MISC, USE TO CHECK BLOOD SUGAR UP TO 10 TIMES A DAY, Disp: 1 each, Rfl: 3   COVID-19 mRNA Vac-TriS, Pfizer, (PFIZER-BIONT COVID-19 VAC-TRIS) SUSP injection, Inject into the muscle., Disp: 0.3 mL, Rfl: 0   empagliflozin (JARDIANCE) 25 MG TABS tablet, Take 1 tablet (25 mg total) by mouth daily before breakfast., Disp: 90 tablet, Rfl: 1   FREESTYLE LITE test strip, , Disp: , Rfl:    gabapentin (NEURONTIN) 800 MG tablet, Take 1 tablet (800 mg total) by mouth 3 (three) times daily., Disp: 270 tablet, Rfl: 1   insulin aspart (NOVOLOG FLEXPEN) 100 UNIT/ML FlexPen, Inject under the skin 3 times daily -10 units, 7 units, and 7 units. Increase dosage by 2 units for every 60mg  of glucose above goal., Disp: 15 mL, Rfl: 2   insulin degludec (TRESIBA FLEXTOUCH) 100 UNIT/ML FlexTouch Pen, INJECT 25 UNITS TOTAL INTO THE SKIN DAILY, Disp: 21 mL, Rfl: 3   insulin lispro (HUMALOG) 100 UNIT/ML KwikPen, Inject 10 Units into the skin 4 (four) times daily., Disp: 18 mL, Rfl: 5   Insulin Pen Needle 32G X 4 MM MISC, Use as directed 6 times daily, Disp: 100 each, Rfl: 8   Lancets (FREESTYLE) lancets, USE TO CHECK BLOOD SUGAR UP TO 4 TIMES A DAY, Disp: 100 each, Rfl: 99   liraglutide (VICTOZA) 18 MG/3ML SOPN, INJECT 1.8 MG INTO THE SKIN DAILY., Disp: 9 mL, Rfl: 3   losartan (COZAAR) 50 MG tablet, Take 1 tablet (50 mg total) by mouth daily., Disp: 90 tablet, Rfl: 3   Omega-3 Fatty Acids (FISH OIL) 1000 MG CAPS, Take 1 capsule by mouth 2 (two) times daily., Disp: , Rfl:    pantoprazole (PROTONIX) 40 MG tablet, Take 1 tablet (40 mg total) by mouth daily.,  Disp: 30 tablet, Rfl: 3   sildenafil (VIAGRA) 50 MG tablet, Take 1 tablet (50 mg total) by mouth daily as needed for erectile dysfunction., Disp: 30 tablet, Rfl: 0   Simethicone 180 MG CAPS, Take 1 capsule (180 mg total) by mouth 3 (three) times daily as needed., Disp: 60 capsule, Rfl: 3   triamterene-hydrochlorothiazide (MAXZIDE)  75-50 MG tablet, Take 0.5 tablets by mouth daily., Disp: 45 tablet, Rfl: 1   verapamil (CALAN-SR) 240 MG CR tablet, Take 1 tablet (240 mg total) by mouth daily., Disp: 90 tablet, Rfl: 1   Review of Systems  Constitutional:  Negative for activity change, appetite change, chills, diaphoresis, fatigue, fever and unexpected weight change.  HENT:  Negative for congestion, rhinorrhea, sinus pressure, sneezing, sore throat and trouble swallowing.   Eyes:  Negative for photophobia and visual disturbance.  Respiratory:  Negative for cough, chest tightness, shortness of breath, wheezing and stridor.   Cardiovascular:  Negative for chest pain, palpitations and leg swelling.  Gastrointestinal:  Negative for abdominal distention, abdominal pain, anal bleeding, blood in stool, constipation, diarrhea, nausea and vomiting.  Genitourinary:  Negative for difficulty urinating, dysuria, flank pain and hematuria.  Musculoskeletal:  Negative for arthralgias, back pain, gait problem, joint swelling and myalgias.  Skin:  Negative for color change, pallor, rash and wound.  Neurological:  Negative for dizziness, tremors, weakness and light-headedness.  Hematological:  Negative for adenopathy. Does not bruise/bleed easily.  Psychiatric/Behavioral:  Negative for agitation, behavioral problems, confusion, decreased concentration, dysphoric mood and sleep disturbance.       Objective:   Physical Exam Constitutional:      Appearance: He is well-developed.  HENT:     Head: Normocephalic and atraumatic.  Eyes:     Conjunctiva/sclera: Conjunctivae normal.  Cardiovascular:     Rate and Rhythm:  Normal rate and regular rhythm.  Pulmonary:     Effort: Pulmonary effort is normal. No respiratory distress.     Breath sounds: No wheezing.  Abdominal:     General: There is no distension.     Palpations: Abdomen is soft.  Musculoskeletal:        General: No tenderness. Normal range of motion.     Cervical back: Normal range of motion and neck supple.  Skin:    General: Skin is warm and dry.     Coloration: Skin is not pale.     Findings: No erythema or rash.  Neurological:     Mental Status: He is alert and oriented to person, place, and time.          Assessment & Plan:   HIV disease:  I have read reviewed his most recent viral load which was not detected on November 03, 2021 and his CD4 count on the same date that was 619  Lab Results  Component Value Date   HIV1RNAQUANT Not Detected 11/03/2021   Lab Results  Component Value Date   CD4TABS 607 11/10/2020   CD4TABS 705 12/02/2019   CD4TABS 380 (L) 10/15/2018   Continuing [facility] BIKTARVY with a years worth of refills.    Insulin-dependent diabetes mellitus he is on insulin and also has a continuous monitor now his A1c has come down to 6.8 on labs are reviewed from September 22, 2019   Lab Results  Component Value Date   HGBA1C 6.8 (A) 09/21/2021    Lipidemia: He is continuing on Lipitor most recent LDL was 97 on labs reviewed from November 03, 2021 current thromboembolism: On lifelong anticoagulation hypertension:  He is continuing on      Component Value Date/Time   CHOL 160 11/03/2021 0954   TRIG 118 11/03/2021 0954   HDL 42 11/03/2021 0954   CHOLHDL 3.8 11/03/2021 0954   VLDL 18 03/05/2017 1135   LDLCALC 97 11/03/2021 0954    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

## 2021-11-18 ENCOUNTER — Other Ambulatory Visit (HOSPITAL_COMMUNITY): Payer: Self-pay

## 2021-11-18 MED ORDER — PANTOPRAZOLE SODIUM 40 MG PO TBEC
40.0000 mg | DELAYED_RELEASE_TABLET | Freq: Every day | ORAL | 3 refills | Status: DC
  Filled 2021-11-18: qty 30, 30d supply, fill #0
  Filled 2021-11-23: qty 90, 90d supply, fill #0
  Filled 2022-02-20: qty 90, 90d supply, fill #1

## 2021-11-23 ENCOUNTER — Other Ambulatory Visit (HOSPITAL_COMMUNITY): Payer: Self-pay

## 2021-11-24 ENCOUNTER — Other Ambulatory Visit: Payer: Self-pay

## 2021-11-24 ENCOUNTER — Other Ambulatory Visit (HOSPITAL_COMMUNITY): Payer: Self-pay

## 2021-11-24 MED ORDER — INSULIN PEN NEEDLE 32G X 4 MM MISC
8 refills | Status: DC
  Filled 2021-11-24: qty 500, 84d supply, fill #0
  Filled 2022-03-21: qty 400, 66d supply, fill #1

## 2021-11-28 ENCOUNTER — Other Ambulatory Visit (HOSPITAL_COMMUNITY): Payer: Self-pay

## 2021-11-28 ENCOUNTER — Encounter: Payer: Self-pay | Admitting: Dietician

## 2021-11-29 ENCOUNTER — Other Ambulatory Visit (HOSPITAL_COMMUNITY): Payer: Self-pay

## 2021-11-29 ENCOUNTER — Other Ambulatory Visit: Payer: Self-pay

## 2021-11-29 ENCOUNTER — Other Ambulatory Visit: Payer: Self-pay | Admitting: Student

## 2021-11-29 DIAGNOSIS — E1140 Type 2 diabetes mellitus with diabetic neuropathy, unspecified: Secondary | ICD-10-CM

## 2021-11-29 DIAGNOSIS — Z794 Long term (current) use of insulin: Secondary | ICD-10-CM

## 2021-11-29 MED ORDER — VICTOZA 18 MG/3ML ~~LOC~~ SOPN
1.8000 mg | PEN_INJECTOR | Freq: Every day | SUBCUTANEOUS | 3 refills | Status: DC
  Filled 2021-11-29: qty 9, 30d supply, fill #0
  Filled 2022-01-02: qty 9, 30d supply, fill #1

## 2021-11-29 NOTE — Telephone Encounter (Signed)
Next appt scheduled 01/11/22 with PCP.

## 2021-11-30 ENCOUNTER — Other Ambulatory Visit: Payer: Self-pay | Admitting: Internal Medicine

## 2021-11-30 ENCOUNTER — Other Ambulatory Visit (HOSPITAL_COMMUNITY): Payer: Self-pay

## 2021-11-30 DIAGNOSIS — D6859 Other primary thrombophilia: Secondary | ICD-10-CM

## 2021-11-30 MED ORDER — APIXABAN 5 MG PO TABS
5.0000 mg | ORAL_TABLET | Freq: Two times a day (BID) | ORAL | 2 refills | Status: DC
  Filled 2021-11-30: qty 180, 90d supply, fill #0
  Filled 2022-03-09: qty 180, 90d supply, fill #1
  Filled 2022-06-01: qty 180, 90d supply, fill #2

## 2021-12-01 ENCOUNTER — Other Ambulatory Visit (HOSPITAL_COMMUNITY): Payer: Self-pay

## 2021-12-22 ENCOUNTER — Other Ambulatory Visit (HOSPITAL_COMMUNITY): Payer: Self-pay

## 2021-12-22 ENCOUNTER — Other Ambulatory Visit: Payer: Self-pay

## 2021-12-23 ENCOUNTER — Other Ambulatory Visit (HOSPITAL_COMMUNITY): Payer: Self-pay

## 2021-12-23 ENCOUNTER — Other Ambulatory Visit: Payer: Self-pay | Admitting: Internal Medicine

## 2021-12-23 DIAGNOSIS — E1140 Type 2 diabetes mellitus with diabetic neuropathy, unspecified: Secondary | ICD-10-CM

## 2021-12-23 MED ORDER — DEXCOM G6 TRANSMITTER MISC
3 refills | Status: DC
  Filled 2021-12-23: qty 1, fill #0
  Filled 2021-12-27: qty 1, 90d supply, fill #0

## 2021-12-27 ENCOUNTER — Other Ambulatory Visit (HOSPITAL_COMMUNITY): Payer: Self-pay

## 2022-01-02 ENCOUNTER — Other Ambulatory Visit (HOSPITAL_COMMUNITY): Payer: Self-pay

## 2022-01-03 ENCOUNTER — Other Ambulatory Visit (HOSPITAL_COMMUNITY): Payer: Self-pay

## 2022-01-04 ENCOUNTER — Encounter: Payer: 59 | Admitting: Student

## 2022-01-11 ENCOUNTER — Other Ambulatory Visit: Payer: Self-pay | Admitting: *Deleted

## 2022-01-11 ENCOUNTER — Ambulatory Visit (INDEPENDENT_AMBULATORY_CARE_PROVIDER_SITE_OTHER): Payer: 59 | Admitting: Student

## 2022-01-11 ENCOUNTER — Other Ambulatory Visit: Payer: Self-pay

## 2022-01-11 ENCOUNTER — Other Ambulatory Visit (HOSPITAL_COMMUNITY): Payer: Self-pay

## 2022-01-11 ENCOUNTER — Encounter: Payer: Self-pay | Admitting: Student

## 2022-01-11 VITALS — BP 116/70 | HR 68 | Temp 98.7°F | Ht 72.0 in | Wt 219.4 lb

## 2022-01-11 DIAGNOSIS — I10 Essential (primary) hypertension: Secondary | ICD-10-CM

## 2022-01-11 DIAGNOSIS — M5441 Lumbago with sciatica, right side: Secondary | ICD-10-CM

## 2022-01-11 DIAGNOSIS — E1140 Type 2 diabetes mellitus with diabetic neuropathy, unspecified: Secondary | ICD-10-CM

## 2022-01-11 DIAGNOSIS — L97329 Non-pressure chronic ulcer of left ankle with unspecified severity: Secondary | ICD-10-CM | POA: Diagnosis not present

## 2022-01-11 DIAGNOSIS — Z794 Long term (current) use of insulin: Secondary | ICD-10-CM | POA: Diagnosis not present

## 2022-01-11 DIAGNOSIS — G8929 Other chronic pain: Secondary | ICD-10-CM

## 2022-01-11 DIAGNOSIS — I8393 Asymptomatic varicose veins of bilateral lower extremities: Secondary | ICD-10-CM

## 2022-01-11 LAB — POCT GLYCOSYLATED HEMOGLOBIN (HGB A1C): Hemoglobin A1C: 7.3 % — AB (ref 4.0–5.6)

## 2022-01-11 LAB — GLUCOSE, CAPILLARY: Glucose-Capillary: 111 mg/dL — ABNORMAL HIGH (ref 70–99)

## 2022-01-11 MED ORDER — DEXCOM G6 SENSOR MISC
3 refills | Status: DC
  Filled 2022-01-11: qty 9, 90d supply, fill #0

## 2022-01-11 MED ORDER — GABAPENTIN 800 MG PO TABS
800.0000 mg | ORAL_TABLET | Freq: Three times a day (TID) | ORAL | 1 refills | Status: DC
  Filled 2022-01-11 – 2022-03-21 (×2): qty 270, 90d supply, fill #0
  Filled 2022-06-13 (×3): qty 270, 90d supply, fill #1

## 2022-01-11 MED ORDER — VERAPAMIL HCL ER 240 MG PO TBCR
240.0000 mg | EXTENDED_RELEASE_TABLET | Freq: Every day | ORAL | 1 refills | Status: DC
  Filled 2022-01-11 – 2022-03-21 (×2): qty 90, 90d supply, fill #0
  Filled 2022-06-27: qty 90, 90d supply, fill #1

## 2022-01-11 MED ORDER — LOSARTAN POTASSIUM 50 MG PO TABS
50.0000 mg | ORAL_TABLET | Freq: Every day | ORAL | 3 refills | Status: DC
  Filled 2022-01-11 – 2022-04-10 (×2): qty 90, 90d supply, fill #0
  Filled 2022-05-22 – 2022-06-27 (×2): qty 90, 90d supply, fill #1
  Filled 2022-09-26: qty 90, 90d supply, fill #2
  Filled 2023-01-03: qty 90, 90d supply, fill #3

## 2022-01-11 MED ORDER — TIRZEPATIDE 2.5 MG/0.5ML ~~LOC~~ SOAJ
SUBCUTANEOUS | 0 refills | Status: DC
  Filled 2022-01-11: qty 2, 28d supply, fill #0

## 2022-01-11 MED ORDER — BACLOFEN 20 MG PO TABS
20.0000 mg | ORAL_TABLET | Freq: Three times a day (TID) | ORAL | 2 refills | Status: DC
  Filled 2022-01-11 – 2022-01-30 (×2): qty 90, 30d supply, fill #0
  Filled 2022-03-19: qty 90, 30d supply, fill #1
  Filled 2022-04-25: qty 90, 30d supply, fill #2

## 2022-01-11 MED ORDER — TRIAMTERENE-HCTZ 75-50 MG PO TABS
0.5000 | ORAL_TABLET | Freq: Every day | ORAL | 1 refills | Status: DC
  Filled 2022-01-11 – 2022-02-07 (×2): qty 45, 90d supply, fill #0
  Filled 2022-05-08: qty 45, 90d supply, fill #1

## 2022-01-11 MED ORDER — DEXCOM G7 RECEIVER DEVI
3 refills | Status: DC
  Filled 2022-01-11: qty 9, 90d supply, fill #0

## 2022-01-11 NOTE — Patient Instructions (Signed)
Mr.Benji L Seidel, it was a pleasure seeing you today! ? ?Today we discussed: ?- The wound on your heel looks focal and not infected, please continue covering this area and avoid scratching. ? ?- Decrease your insulin to 18 units daily. I have also switched from Victoza to [facility] (tirzepatide). Do not take anymore Victoza.  ? ?- I have sent in your refills. ? ?I have ordered the following labs today: ? ? ?Lab Orders    ?     Microalbumin / Creatinine Urine Ratio    ?     Glucose, capillary    ?     POC Hbg A1C     ? ? ?I have ordered the following medication/changed the following medications:  ? ?Start the following medications: ?Meds ordered this encounter  ?Medications  ? tirzepatide [facility]) 2.5 MG/0.5ML Pen  ?  Sig: Inject 2.5 mg into the skin once a week for 28 days, THEN 5 mg once a week.  ?  Dispense:  14 mL  ?  Refill:  0  ? verapamil (CALAN-SR) 240 MG CR tablet  ?  Sig: Take 1 tablet (240 mg total) by mouth daily.  ?  Dispense:  90 tablet  ?  Refill:  1  ? baclofen (LIORESAL) 20 MG tablet  ?  Sig: Take 1 tablet (20 mg total) by mouth 3 (three) times daily.  ?  Dispense:  90 tablet  ?  Refill:  2  ? gabapentin (NEURONTIN) 800 MG tablet  ?  Sig: Take 1 tablet (800 mg total) by mouth 3 (three) times daily.  ?  Dispense:  270 tablet  ?  Refill:  1  ? losartan (COZAAR) 50 MG tablet  ?  Sig: Take 1 tablet (50 mg total) by mouth daily.  ?  Dispense:  90 tablet  ?  Refill:  3  ? triamterene-hydrochlorothiazide (MAXZIDE) 75-50 MG tablet  ?  Sig: Take 0.5 tablets by mouth daily.  ?  Dispense:  45 tablet  ?  Refill:  1  ?  ? ?Follow-up: 3 months  ? ?Please make sure to arrive 15 minutes prior to your next appointment. If you arrive late, you may be asked to reschedule.  ? ?We look forward to seeing you next time. Please call our clinic at [phone] if you have any questions or concerns. The best time to call is Monday-Friday from 9am-4pm, but there is someone available 24/7. If after hours or the weekend,  call the main hospital number and ask for the Internal Medicine Resident On-Call. If you need medication refills, please notify your pharmacy one week in advance and they will send [name] a request. ? ?Thank you for letting us take part in your care. Wishing you the best! ? ?Thank you, ?Sanjuan Dame, MD ? ?

## 2022-01-11 NOTE — Telephone Encounter (Signed)
Received call from [place] at [facility]. States she received Rx for [employer] but still needs one for the sensors. ?

## 2022-01-12 ENCOUNTER — Other Ambulatory Visit (HOSPITAL_COMMUNITY): Payer: Self-pay

## 2022-01-13 DIAGNOSIS — L97309 Non-pressure chronic ulcer of unspecified ankle with unspecified severity: Secondary | ICD-10-CM | POA: Insufficient documentation

## 2022-01-13 NOTE — Assessment & Plan Note (Addendum)
Patient has small ulcer on posterior ankle for the last couple of weeks. He describes intermittent drainage/blood coming from the ulcer, especially after he scratches and picks. He is not having any systemic symptoms and has not noticed any other lesions. The current lesion does not rub against any of his shoes. On exam, the small ulcer appears to be well-healing without any signs or symptoms of infection. I have discussed with Trevor Fischer to keep an eye on the ulcer and to keep this bandaged whenever clothes are covering the area. I also encouraged Trevor Fischer to avoid touching the area if possible. ?

## 2022-01-13 NOTE — Assessment & Plan Note (Signed)
BP Readings from Last 3 Encounters:  ?01/11/22 116/70  ?11/17/21 (!) 146/81  ?09/21/21 (!) 125/59  ? ?Blood pressure at goal today, asymptomatic. This is a chronic, stable issue. Will continue with current regimen. Patient had recent lab work done last month. ? ?- Triamterene-hydrochlorothiazide 37.5-'25mg'$  daily ?- Verapamil '40mg'$  daily ?- Losartan '50mg'$  daily ?

## 2022-01-13 NOTE — Assessment & Plan Note (Addendum)
Trevor Fischer is here for diabetes follow-up. He denies any recent changes in his diet or exercise regimen, although recognizes he needs to exercise more. Reports that he takes long-acting insulin in the morning (he works overnight) and occasionally has lows in the 50's and 60's. At that time he usually gets up and has a snack. Rarely does he feel any symptoms associated with this.  ? ?Today, A1c increased to 7.3% from 6.8%. Will plan to decrease long-acting insulin to decrease risk of hypoglycemia. He is currently not at goal, and given we are decreasing long-acting insulin will also plan to switch to an alternative GLP-1 to improve glycemic control. ? ?- Decrease Tresiba 18 units daily ?- Humalog 10-7-7 units with meals ?- STOP Victoza daily ?- START tirzepatide 2.'5mg'$  weekly x4 weeks, then increase dose to '5mg'$  weekly ?- Jardiance '25mg'$  daily ?- Microalbumin/creatinine ratio today ?- Foot exam today ?- Repeat A1c in three months ?- Dexcom G7 ordered ? ?*The patient is currently using Continuous Glucose Monitoring. The patient is injecting insulin four times a day. The patient is making adjustments to their diabetes regimen based on glucose readings. ?

## 2022-01-13 NOTE — Progress Notes (Signed)
? ?CC: diabetes follow-up ? ?HPI: ? ?[name] is a 61 y.o. person with medical history as below presenting to [facility] for diabetes follow-up. ? ?Please see problem-based list for further details, assessments, and plans. ? ?Past Medical History:  ?Diagnosis Date  ? Allergic rhinitis   ? Degenerative joint disease of knee   ? DVT (deep venous thrombosis) (HCC) RLE X 2  ? Ended anticoagulation in 2012  ? Flatulence 05/13/2015  ? GERD (gastroesophageal reflux disease)   ? History of DVT of lower extremity 10/08  ? HIV infection ([place])   ? Hx MRSA infection   ? Hyperlipidemia   ? Hyperlipidemia 11/17/2021  ? Hypertension   ? Pulmonary embolism ([place]) 05/14/2017  ? Superficial thrombophlebitis   ? Type II diabetes mellitus ([place]) 9/09  ? ?Review of Systems:  As per HPI ? ?Physical Exam: ? ?Vitals:  ? 01/11/22 1412  ?BP: 116/70  ?Pulse: 68  ?Temp: 98.7 ?F (37.1 ?C)  ?TempSrc: Oral  ?SpO2: 98%  ?Weight: 219 lb 6.4 oz (99.5 kg)  ?Height: 6' (1.829 m)  ? ?General: Resting comfortably in no acute distress ?CV: Regular rate, rhythm. No murmurs appreciated. ?Pulm: Normal respiratory rate on room air. Clear to ausculation bilaterally. ?MSK: Normal bulk, tone. No pitting edema bilateral lower extremities.  ?Skin: Small, 1cm annular wound with overlying crust on posterior left ankle. No active drainage, warmth, or tenderness. ?Neuro: Awake, alert, conversing appropriately. ?Psych: Normal mood, affect, speech. ? ?Assessment & Plan:  ? ?Diabetes mellitus with neuropathy ([place]) ?[name] is here for diabetes follow-up. He denies any recent changes in his diet or exercise regimen, although recognizes he needs to exercise more. Reports that he takes long-acting insulin in the morning (he works overnight) and occasionally has lows in the 50's and 60's. At that time he usually gets up and has a snack. Rarely does he feel any symptoms associated with this.  ? ?Today, A1c increased to 7.3% from 6.8%. Will plan to decrease  long-acting insulin to decrease risk of hypoglycemia. He is currently not at goal, and given we are decreasing long-acting insulin will also plan to switch to an alternative GLP-1 to improve glycemic control. ? ?- Decrease Tresiba 18 units daily ?- Humalog 10-7-7 units with meals ?- STOP Victoza daily ?- START tirzepatide 2.'5mg'$  weekly x4 weeks, then increase dose to '5mg'$  weekly ?- Jardiance '25mg'$  daily ?- Microalbumin/creatinine ratio today ?- Foot exam today ?- Repeat A1c in three months ?- Dexcom G7 ordered ? ?*The patient is currently using Continuous Glucose Monitoring. The patient is injecting insulin four times a day. The patient is making adjustments to their diabetes regimen based on glucose readings. ? ?Hypertension ?BP Readings from Last 3 Encounters:  ?01/11/22 116/70  ?11/17/21 (!) 146/81  ?09/21/21 (!) 125/59  ? ?Blood pressure at goal today, asymptomatic. This is a chronic, stable issue. Will continue with current regimen. Patient had recent lab work done last month. ? ?- Triamterene-hydrochlorothiazide 37.5-'25mg'$  daily ?- Verapamil '40mg'$  daily ?- Losartan '50mg'$  daily ? ?Ulcer of ankle ([place]) ?Patient has small ulcer on posterior ankle for the last couple of weeks. He describes intermittent drainage/blood coming from the ulcer, especially after he scratches and picks. He is not having any systemic symptoms and has not noticed any other lesions. The current lesion does not rub against any of his shoes. On exam, the small ulcer appears to be well-healing without any signs or symptoms of infection. I have discussed with [name] to keep an eye  on the ulcer and to keep this bandaged whenever clothes are covering the area. I also encouraged [name] to avoid touching the area if possible. ? ? ?Patient discussed with Dr.  Jimmye Fischer ? ?Sanjuan Dame, MD ?Internal Medicine PGY-2 ?Pager: [phone] ? ?

## 2022-01-14 LAB — MICROALBUMIN / CREATININE URINE RATIO
Creatinine, Urine: 86.8 mg/dL
Microalb/Creat Ratio: 15 mg/g creat (ref 0–29)
Microalbumin, Urine: 13.3 ug/mL

## 2022-01-17 ENCOUNTER — Other Ambulatory Visit (HOSPITAL_COMMUNITY): Payer: Self-pay

## 2022-01-18 NOTE — Progress Notes (Signed)
Internal Medicine Clinic Attending ? ?Case discussed with Dr. Braswell  At the time of the visit.  We reviewed the resident?s history and exam and pertinent patient test results.  I agree with the assessment, diagnosis, and plan of care documented in the resident?s note.  ?

## 2022-01-19 ENCOUNTER — Other Ambulatory Visit (HOSPITAL_COMMUNITY): Payer: Self-pay

## 2022-01-20 ENCOUNTER — Other Ambulatory Visit (HOSPITAL_COMMUNITY): Payer: Self-pay

## 2022-01-23 ENCOUNTER — Other Ambulatory Visit (HOSPITAL_COMMUNITY): Payer: Self-pay

## 2022-01-23 ENCOUNTER — Other Ambulatory Visit: Payer: Self-pay | Admitting: Student

## 2022-01-23 DIAGNOSIS — E1140 Type 2 diabetes mellitus with diabetic neuropathy, unspecified: Secondary | ICD-10-CM

## 2022-01-24 ENCOUNTER — Other Ambulatory Visit (HOSPITAL_COMMUNITY): Payer: Self-pay

## 2022-01-26 ENCOUNTER — Other Ambulatory Visit (HOSPITAL_COMMUNITY): Payer: Self-pay

## 2022-01-26 MED ORDER — DEXCOM G7 RECEIVER DEVI
3 refills | Status: DC
  Filled 2022-01-26: qty 3, 30d supply, fill #0
  Filled 2022-06-13 – 2022-08-08 (×3): qty 9, 90d supply, fill #0

## 2022-01-30 ENCOUNTER — Other Ambulatory Visit (INDEPENDENT_AMBULATORY_CARE_PROVIDER_SITE_OTHER): Payer: 59 | Admitting: Internal Medicine

## 2022-01-30 ENCOUNTER — Other Ambulatory Visit (HOSPITAL_COMMUNITY): Payer: Self-pay

## 2022-01-30 DIAGNOSIS — E1140 Type 2 diabetes mellitus with diabetic neuropathy, unspecified: Secondary | ICD-10-CM

## 2022-01-30 DIAGNOSIS — Z794 Long term (current) use of insulin: Secondary | ICD-10-CM

## 2022-01-30 MED ORDER — DEXCOM G7 SENSOR MISC
1.0000 "application " | 3 refills | Status: DC
  Filled 2022-01-30: qty 3, 30d supply, fill #0
  Filled 2022-03-01: qty 3, 30d supply, fill #1
  Filled 2022-04-10: qty 3, 30d supply, fill #2
  Filled 2022-05-09: qty 3, 30d supply, fill #3
  Filled 2022-06-13: qty 3, 30d supply, fill #4
  Filled 2022-07-10: qty 3, 30d supply, fill #5

## 2022-01-30 NOTE — Progress Notes (Signed)
Order placed for Dexcom G7 sensor ?

## 2022-01-31 ENCOUNTER — Other Ambulatory Visit (HOSPITAL_COMMUNITY): Payer: Self-pay

## 2022-01-31 ENCOUNTER — Telehealth: Payer: 59 | Admitting: Internal Medicine

## 2022-02-02 ENCOUNTER — Telehealth: Payer: Self-pay | Admitting: Dietician

## 2022-02-02 NOTE — Telephone Encounter (Signed)
Scheduled for Decom G7 Continuous glucose monitoring start. ?  ?

## 2022-02-07 ENCOUNTER — Other Ambulatory Visit (HOSPITAL_COMMUNITY): Payer: Self-pay

## 2022-02-09 ENCOUNTER — Telehealth: Payer: Self-pay | Admitting: Dietician

## 2022-02-09 ENCOUNTER — Ambulatory Visit (INDEPENDENT_AMBULATORY_CARE_PROVIDER_SITE_OTHER): Payer: 59 | Admitting: Dietician

## 2022-02-09 DIAGNOSIS — Z794 Long term (current) use of insulin: Secondary | ICD-10-CM | POA: Diagnosis not present

## 2022-02-09 DIAGNOSIS — E1140 Type 2 diabetes mellitus with diabetic neuropathy, unspecified: Secondary | ICD-10-CM

## 2022-02-09 NOTE — Patient Instructions (Signed)
Good Job starting your G7 sensor!! ? ? [name] ?([phone] ? ?

## 2022-02-09 NOTE — Telephone Encounter (Signed)
Assisted patient in troubleshooting his Dexcom G7 app and sensor ?

## 2022-02-09 NOTE — Progress Notes (Signed)
Diabetes Self-Management Education ? ?Visit Type: Annual Follow-Up (here for assistance with transition to [place] from Dexcom G6) ? ?Appt. Start Time: 900 Appt. End Time: 930 ? ?02/09/2022 ? ?[name], identified by name and date of birth, is a 61 y.o. male with a diagnosis of Diabetes:  Type 2 ? ?ASSESSMENT ? ?Estimated body mass index is 29.76 kg/m? as calculated from the following: ?  Height as of 01/11/22: 6' (1.829 m). ?  Weight as of 01/11/22: 219 lb 6.4 oz (99.5 kg). ?Wt Readings from Last 10 Encounters:  ?01/11/22 219 lb 6.4 oz (99.5 kg)  ?11/17/21 220 lb (99.8 kg)  ?09/21/21 221 lb 4.8 oz (100.4 kg)  ?07/20/21 220 lb (99.8 kg)  ?07/11/21 220 lb (99.8 kg)  ?04/13/21 220 lb 9.6 oz (100.1 kg)  ?03/02/21 223 lb 3.2 oz (101.2 kg)  ?01/19/21 222 lb 12.8 oz (101.1 kg)  ?11/25/20 227 lb (103 kg)  ?09/17/20 226 lb 12.8 oz (102.9 kg)  ? ?Lab Results  ?Component Value Date  ? HGBA1C 7.3 (A) 01/11/2022  ? HGBA1C 6.8 (A) 09/21/2021  ? HGBA1C 7.7 (A) 07/11/2021  ? HGBA1C 8.0 (A) 04/13/2021  ? HGBA1C 7.9 (A) 01/19/2021  ?  ?BP Readings from Last 3 Encounters:  ?01/11/22 116/70  ?11/17/21 (!) 146/81  ?09/21/21 (!) 125/59  ? ? ? ? Diabetes Self-Management Education - 02/09/22 0900   ? ?  ? Visit Information  ? Visit Type Annual Follow-Up   here for assistance with transition to [place] from [place]  ?  ? Health Coping  ? How would you rate your overall health? Good   ?  ? Psychosocial Assessment  ? Patient Belief/Attitude about Diabetes Motivated to manage diabetes   ? What is the hardest part about your diabetes right now, causing you the most concern, or is the most worrisome to you about your diabetes?   Making healty food and beverage choices;Other (comment)   none mentioned today  ? Self-care barriers None   ? Self-management support Family;CDE visits   ? Patient Concerns Monitoring   ? Special Needs None   ? Preferred Learning Style No preference indicated   ? Learning Readiness Change in progress    ? How often do you need to have someone help you when you read instructions, pamphlets, or other written materials from your doctor or pharmacy? 2 - Rarely   ? What is the last grade level you completed in school? 16   ?  ? Pre-Education Assessment  ? Patient understands the diabetes disease and treatment process. N/A (Comment)   patient is only interested in help starting dexcom G7 today  ? Patient understands incorporating nutritional management into lifestyle. NA (comment)   ? Patient undertands incorporating physical activity into lifestyle. N/A (comment)   ? Patient understands using medications safely. N/A (comment)   ? Patient understands monitoring blood glucose, interpreting and using results Needs Review   ? Patient understands prevention, detection, and treatment of acute complications. N/A (comment)   ? Patient understands prevention, detection, and treatment of chronic complications. N/A (comment)   ? Patient understands how to develop strategies to address psychosocial issues. N/A (comment)   ? Patient understands how to develop strategies to promote health/change behavior. N/A (comment)   ?  ? Complications  ? Last HgB A1C per patient/outside source 7.3 %   ? How often do you check your blood sugar? > 4 times/day   ? Fasting Blood glucose range (mg/dL) 70-129;130-179   ?  Postprandial Blood glucose range (mg/dL) 130-179;180-200   ? Number of hypoglycemic episodes per month 0   ? Number of hyperglycemic episodes ( >'200mg'$ /dL): Daily   ? Can you tell when your blood sugar is high? Yes   ? What do you do if your blood sugar is high? move more or drink water   ? Have you had a dilated eye exam in the past 12 months? Yes   ? Have you had a dental exam in the past 12 months? Yes   ? Are you checking your feet? Yes   ? How many days per week are you checking your feet? 7   ?  ? Dietary Intake  ? Breakfast deferred detailed dietary intake   ?  ? Activity / Exercise  ? Activity / Exercise Type Moderate  (swimming / aerobic walking)   ? How many days per week do you exercise? 7   ? How many minutes per day do you exercise? 30   ? Total minutes per week of exercise 210   ?  ? Patient Education  ? Previous Diabetes Education Yes (please comment)   here and when diagnosed  ? Monitoring Taught/evaluated CGM (comment)   patient was assisted downloaded dexcom G7 all and starting his sensor  ?  ? Individualized Goals (developed by patient)  ? Monitoring  Consistenly use CGM   ?  ? Patient Self-Evaluation of Goals - Patient rates self as meeting previously set goals (% of time)  ? Monitoring >75% (most of the time)   ?  ? Post-Education Assessment  ? Patient understands monitoring blood glucose, interpreting and using results Demonstrates understanding / competency   ?  ? Outcomes  ? Expected Outcomes Demonstrated interest in learning. Expect positive outcomes   ? Future DMSE Yearly   ? Program Status Completed   ?  ? Subsequent Visit  ? Since your last visit have you continued or begun to take your medications as prescribed? Yes   ? Since your last visit have you had your blood pressure checked? Yes   ? Is your most recent blood pressure lower, unchanged, or higher since your last visit? Lower   ? Since your last visit have you experienced any weight changes? Loss   ? Weight Loss (lbs) 7   ? Since your last visit, are you checking your blood glucose at least once a day? Yes   continuously with Dexcom G6  ? ?  ?  ? ?  ? ? ?Individualized Plan for Diabetes Self-Management Training:  ? ?Learning Objective:  Patient will have a greater understanding of diabetes self-management. ?Patient education plan is to attend individual and/or group sessions per assessed needs and concerns. ?  ?Plan:  ? ?Patient Instructions  ?Good Job starting your G7 sensor!! ? ? [name] ?([id number] ? ? ?Expected Outcomes:  Demonstrated interest in learning. Expect positive outcomes ? ?Education material provided: Diabetes Resources ? ?If problems or  questions, patient to contact team via:  Phone ? ?Future DSME appointment: Yearly ?[name], RD ?02/09/2022 ?9:57 AM. ? ?

## 2022-02-20 ENCOUNTER — Other Ambulatory Visit: Payer: Self-pay | Admitting: Student

## 2022-02-20 ENCOUNTER — Other Ambulatory Visit (HOSPITAL_COMMUNITY): Payer: Self-pay

## 2022-02-20 DIAGNOSIS — E1140 Type 2 diabetes mellitus with diabetic neuropathy, unspecified: Secondary | ICD-10-CM

## 2022-02-20 MED ORDER — EMPAGLIFLOZIN 25 MG PO TABS
25.0000 mg | ORAL_TABLET | Freq: Every day | ORAL | 1 refills | Status: DC
  Filled 2022-02-20: qty 90, 90d supply, fill #0
  Filled 2022-05-22: qty 90, 90d supply, fill #1

## 2022-02-21 ENCOUNTER — Other Ambulatory Visit (HOSPITAL_COMMUNITY): Payer: Self-pay

## 2022-02-21 ENCOUNTER — Other Ambulatory Visit: Payer: Self-pay | Admitting: Student

## 2022-02-21 DIAGNOSIS — K219 Gastro-esophageal reflux disease without esophagitis: Secondary | ICD-10-CM

## 2022-02-22 ENCOUNTER — Other Ambulatory Visit (HOSPITAL_COMMUNITY): Payer: Self-pay

## 2022-02-22 MED ORDER — PANTOPRAZOLE SODIUM 40 MG PO TBEC
40.0000 mg | DELAYED_RELEASE_TABLET | Freq: Every day | ORAL | 3 refills | Status: DC
Start: 2022-02-22 — End: 2023-02-19
  Filled 2022-02-22: qty 90, 90d supply, fill #0
  Filled 2022-05-22: qty 90, 90d supply, fill #1
  Filled 2022-08-23: qty 90, 90d supply, fill #2
  Filled 2022-11-16: qty 90, 90d supply, fill #3

## 2022-02-28 ENCOUNTER — Other Ambulatory Visit: Payer: Self-pay | Admitting: Student

## 2022-02-28 ENCOUNTER — Other Ambulatory Visit (HOSPITAL_COMMUNITY): Payer: Self-pay

## 2022-02-28 DIAGNOSIS — E1140 Type 2 diabetes mellitus with diabetic neuropathy, unspecified: Secondary | ICD-10-CM

## 2022-03-01 ENCOUNTER — Other Ambulatory Visit (HOSPITAL_COMMUNITY): Payer: Self-pay

## 2022-03-01 ENCOUNTER — Other Ambulatory Visit: Payer: Self-pay | Admitting: *Deleted

## 2022-03-01 ENCOUNTER — Other Ambulatory Visit: Payer: Self-pay | Admitting: Student

## 2022-03-01 DIAGNOSIS — E1140 Type 2 diabetes mellitus with diabetic neuropathy, unspecified: Secondary | ICD-10-CM

## 2022-03-01 MED ORDER — MOUNJARO 2.5 MG/0.5ML ~~LOC~~ SOAJ
7.5000 mg | SUBCUTANEOUS | 3 refills | Status: DC
Start: 2022-03-01 — End: 2022-03-01
  Filled 2022-03-01: qty 6, 28d supply, fill #0

## 2022-03-01 MED ORDER — MOUNJARO 5 MG/0.5ML ~~LOC~~ SOAJ
5.0000 mg | SUBCUTANEOUS | 0 refills | Status: DC
  Filled 2022-03-01: qty 2, 28d supply, fill #0

## 2022-03-01 MED ORDER — MOUNJARO 7.5 MG/0.5ML ~~LOC~~ SOAJ
7.5000 mg | SUBCUTANEOUS | 0 refills | Status: DC
  Filled 2022-03-01: qty 2, 28d supply, fill #0

## 2022-03-01 NOTE — Telephone Encounter (Signed)
Patient needs refill on Mounjaro will be starting the 5 mg.

## 2022-03-02 ENCOUNTER — Other Ambulatory Visit (HOSPITAL_COMMUNITY): Payer: Self-pay

## 2022-03-09 ENCOUNTER — Other Ambulatory Visit (HOSPITAL_COMMUNITY): Payer: Self-pay

## 2022-03-17 ENCOUNTER — Other Ambulatory Visit (HOSPITAL_COMMUNITY): Payer: Self-pay

## 2022-03-20 ENCOUNTER — Other Ambulatory Visit (HOSPITAL_COMMUNITY): Payer: Self-pay

## 2022-03-21 ENCOUNTER — Other Ambulatory Visit (HOSPITAL_COMMUNITY): Payer: Self-pay

## 2022-03-23 ENCOUNTER — Other Ambulatory Visit (HOSPITAL_COMMUNITY): Payer: Self-pay

## 2022-03-26 ENCOUNTER — Other Ambulatory Visit: Payer: Self-pay | Admitting: Student

## 2022-03-27 ENCOUNTER — Other Ambulatory Visit (HOSPITAL_COMMUNITY): Payer: Self-pay

## 2022-03-27 MED ORDER — MOUNJARO 5 MG/0.5ML ~~LOC~~ SOAJ
5.0000 mg | SUBCUTANEOUS | 0 refills | Status: DC
  Filled 2022-03-27: qty 2, 28d supply, fill #0

## 2022-03-29 ENCOUNTER — Other Ambulatory Visit (HOSPITAL_COMMUNITY): Payer: Self-pay

## 2022-03-30 ENCOUNTER — Other Ambulatory Visit (HOSPITAL_COMMUNITY): Payer: Self-pay

## 2022-03-31 ENCOUNTER — Other Ambulatory Visit (HOSPITAL_COMMUNITY): Payer: Self-pay

## 2022-04-06 ENCOUNTER — Other Ambulatory Visit (HOSPITAL_COMMUNITY): Payer: Self-pay

## 2022-04-06 MED ORDER — TETANUS-DIPHTH-ACELL PERTUSSIS 5-2.5-18.5 LF-MCG/0.5 IM SUSY
0.5000 mL | PREFILLED_SYRINGE | INTRAMUSCULAR | 0 refills | Status: DC
  Filled 2022-04-06: qty 0.5, 1d supply, fill #0

## 2022-04-10 ENCOUNTER — Other Ambulatory Visit: Payer: Self-pay | Admitting: Student

## 2022-04-10 ENCOUNTER — Other Ambulatory Visit (HOSPITAL_COMMUNITY): Payer: Self-pay

## 2022-04-10 DIAGNOSIS — E1140 Type 2 diabetes mellitus with diabetic neuropathy, unspecified: Secondary | ICD-10-CM

## 2022-04-11 ENCOUNTER — Encounter: Payer: Self-pay | Admitting: Student

## 2022-04-11 ENCOUNTER — Other Ambulatory Visit (HOSPITAL_COMMUNITY): Payer: Self-pay

## 2022-04-11 ENCOUNTER — Ambulatory Visit (INDEPENDENT_AMBULATORY_CARE_PROVIDER_SITE_OTHER): Payer: 59 | Admitting: Student

## 2022-04-11 VITALS — BP 133/65 | HR 80 | Temp 98.5°F | Ht 72.0 in | Wt 197.0 lb

## 2022-04-11 DIAGNOSIS — Z794 Long term (current) use of insulin: Secondary | ICD-10-CM | POA: Diagnosis not present

## 2022-04-11 DIAGNOSIS — E1140 Type 2 diabetes mellitus with diabetic neuropathy, unspecified: Secondary | ICD-10-CM | POA: Diagnosis not present

## 2022-04-11 DIAGNOSIS — Z87891 Personal history of nicotine dependence: Secondary | ICD-10-CM | POA: Diagnosis not present

## 2022-04-11 DIAGNOSIS — I10 Essential (primary) hypertension: Secondary | ICD-10-CM

## 2022-04-11 DIAGNOSIS — Z7984 Long term (current) use of oral hypoglycemic drugs: Secondary | ICD-10-CM

## 2022-04-11 LAB — POCT GLYCOSYLATED HEMOGLOBIN (HGB A1C): Hemoglobin A1C: 6.9 % — AB (ref 4.0–5.6)

## 2022-04-11 LAB — GLUCOSE, CAPILLARY: Glucose-Capillary: 118 mg/dL — ABNORMAL HIGH (ref 70–99)

## 2022-04-11 MED ORDER — INSULIN LISPRO 100 UNIT/ML IJ SOLN
INTRAMUSCULAR | 3 refills | Status: DC
  Filled 2022-04-11: qty 10, fill #0

## 2022-04-11 MED ORDER — INSULIN PEN NEEDLE 32G X 4 MM MISC
8 refills | Status: DC
  Filled 2022-04-11: qty 100, 17d supply, fill #0
  Filled 2022-06-01: qty 500, 83d supply, fill #0
  Filled 2022-08-08: qty 400, 68d supply, fill #0
  Filled 2023-01-16: qty 400, 68d supply, fill #1

## 2022-04-11 MED ORDER — INSULIN LISPRO 200 UNIT/ML ~~LOC~~ SOPN
PEN_INJECTOR | SUBCUTANEOUS | 2 refills | Status: DC
  Filled 2022-04-11: qty 6, 30d supply, fill #0
  Filled 2022-06-13: qty 6, 30d supply, fill #1

## 2022-04-11 MED ORDER — ATORVASTATIN CALCIUM 40 MG PO TABS
40.0000 mg | ORAL_TABLET | Freq: Every day | ORAL | 1 refills | Status: DC
  Filled 2022-04-11: qty 90, 90d supply, fill #0
  Filled 2022-04-25 – 2022-06-27 (×3): qty 90, 90d supply, fill #1

## 2022-04-11 NOTE — Patient Instructions (Signed)
[name], it was a pleasure seeing you today!  Today we discussed: - Diabetes: I would like for you to stop taking [place]. Instead, I have placed you on a sliding scale insulin regimen. That means you will take a certain amount of inulin based on your sugar. I have the scale listed below.  I have ordered the following labs today:   Lab Orders         Glucose, capillary         POC Hbg A1C      I have ordered the following medication/changed the following medications:   Stop the following medications: Medications Discontinued During This Encounter  Medication Reason   insulin degludec (TRESIBA FLEXTOUCH) 100 UNIT/ML FlexTouch Pen    insulin lispro (HUMALOG) 100 UNIT/ML KwikPen      Start the following medications: Meds ordered this encounter  Medications   insulin lispro (HUMALOG) 100 UNIT/ML injection    Sig: Please base amount of insulin on sugar level prior to eating. See directions below. 130-180: Take 2 units 181-240: Take 4 units 241-300: Take 6 units 301-350: Take 8 units 351-400: Take 10 units    Dispense:  10 mL    Refill:  3     Follow-up: 3 months   Please make sure to arrive 15 minutes prior to your next appointment. If you arrive late, you may be asked to reschedule.   We look forward to seeing you next time. Please call our clinic at [phone] if you have any questions or concerns. The best time to call is Monday-Friday from 9am-4pm, but there is someone available 24/7. If after hours or the weekend, call the main hospital number and ask for the Internal Medicine Resident On-Call. If you need medication refills, please notify your pharmacy one week in advance and they will send [name] a request.  Thank you for letting us take part in your care. Wishing you the best!  Thank you, Sanjuan Dame, MD

## 2022-04-12 NOTE — Progress Notes (Signed)
   CC: diabetes follow-up  HPI:  Trevor Fischer is a 61 y.o. person with medical history as below presenting to [facility] for diabetes follow-up  Please see problem-based list for further details, assessments, and plans.  Past Medical History:  Diagnosis Date   Allergic rhinitis    Degenerative joint disease of knee    DVT (deep venous thrombosis) (HCC) RLE X 2   Ended anticoagulation in 2012   Flatulence 05/13/2015   GERD (gastroesophageal reflux disease)    History of DVT of lower extremity 10/08   HIV infection ([place])    Hx MRSA infection    Hyperlipidemia    Hyperlipidemia 11/17/2021   Hypertension    Pulmonary embolism ([place]) 05/14/2017   Superficial thrombophlebitis    Type II diabetes mellitus ([place]) 9/09   Review of Systems:  As per HPI  Physical Exam:  Vitals:   04/11/22 1446  BP: 133/65  Pulse: 80  Temp: 98.5 F (36.9 C)  TempSrc: Oral  SpO2: 97%  Weight: 197 lb (89.4 kg)  Height: 6' (1.829 m)   General: Resting comfortably in no acute distress CV: Regular rate, rhythm. No murmurs appreciated. Warm extremities. Pulm: Normal work of breathing on room air. Clear to ausculation bilaterally MSK: Normal bulk, tone. No peripheral edema appreciated Neuro: Awake, alert, conversing appropriately. Grossly non-focal Psych: Normal mood, affect, speech  Assessment & Plan:   Diabetes mellitus with neuropathy ([place]) Since starting tirzepatide, [name] reports markedly improvement of sugars along with weight loss. He has not been using his long-acting insulin and has only had to use a few units of insulin with meals. Does report continued use of Jardiance. No vision changes, changes in chronic neuropathy. He has not noticed many low sugars, although his partner reports that [name] doesn't always wake up if his Dexcom alarm is alerting in the middle of the night.  Congratulated Mr. [name] on the success, including the weight loss he has seen since starting  tirzepatide. At this point, we will stop his long-acting insulin and place him on a sliding scale for mealtime coverage. If he continues to see that his sugars are well-controlled with minimal insulin, will plan to stop insulin all together at the next appointment.  - Continue tirzepatide '5mg'$ , will increase to 7.'5mg'$  at next re-fill  - STOP [name] - Start Humalog sliding scale:   131-180: Take 2 units  181-240: Take 4 units  241-300: Take 6 units  301-350: Take 8 units  351-400: Take 10 units - Continue Jardiance '25mg'$  daily - Return to clinic in three months, likely can increase interval at that point   Hypertension BP Readings from Last 3 Encounters:  04/11/22 133/65  01/11/22 116/70  11/17/21 (!) 146/81   Blood pressure mildly elevated but overall at goal, no change in medications at this time.  - Triamterene-hydrochlorothiazide 37.5-'25mg'$  daily - Verapamil '40mg'$  daily - Losartan '50mg'$  daily   Patient discussed with Dr. Lise Auer, MD Internal Medicine PGY-3 Pager: [phone]

## 2022-04-12 NOTE — Assessment & Plan Note (Signed)
Since starting tirzepatide, Trevor Fischer reports markedly improvement of sugars along with weight loss. He has not been using his long-acting insulin and has only had to use a few units of insulin with meals. Does report continued use of Jardiance. No vision changes, changes in chronic neuropathy. He has not noticed many low sugars, although his partner reports that Trevor Fischer doesn't always wake up if his Dexcom alarm is alerting in the middle of the night.  Congratulated Trevor Fischer on the success, including the weight loss he has seen since starting tirzepatide. At this point, we will stop his long-acting insulin and place him on a sliding scale for mealtime coverage. If he continues to see that his sugars are well-controlled with minimal insulin, will plan to stop insulin all together at the next appointment.  - Continue tirzepatide '5mg'$ , will increase to 7.'5mg'$  at next re-fill  - STOP [name] - Start Humalog sliding scale:   131-180: Take 2 units  181-240: Take 4 units  241-300: Take 6 units  301-350: Take 8 units  351-400: Take 10 units - Continue Jardiance '25mg'$  daily - Return to clinic in three months, likely can increase interval at that point

## 2022-04-12 NOTE — Progress Notes (Signed)
Internal Medicine Clinic Attending ? ?Case discussed with Dr. Braswell  At the time of the visit.  We reviewed the resident?s history and exam and pertinent patient test results.  I agree with the assessment, diagnosis, and plan of care documented in the resident?s note.  ?

## 2022-04-12 NOTE — Assessment & Plan Note (Signed)
BP Readings from Last 3 Encounters:  04/11/22 133/65  01/11/22 116/70  11/17/21 (!) 146/81   Blood pressure mildly elevated but overall at goal, no change in medications at this time.  - Triamterene-hydrochlorothiazide 37.5-'25mg'$  daily - Verapamil '40mg'$  daily - Losartan '50mg'$  daily

## 2022-04-13 ENCOUNTER — Other Ambulatory Visit (HOSPITAL_COMMUNITY): Payer: Self-pay

## 2022-04-19 ENCOUNTER — Other Ambulatory Visit (HOSPITAL_COMMUNITY): Payer: Self-pay

## 2022-04-21 ENCOUNTER — Other Ambulatory Visit (HOSPITAL_COMMUNITY): Payer: Self-pay

## 2022-04-24 ENCOUNTER — Other Ambulatory Visit (HOSPITAL_COMMUNITY): Payer: Self-pay

## 2022-04-24 ENCOUNTER — Other Ambulatory Visit: Payer: Self-pay | Admitting: Student

## 2022-04-25 ENCOUNTER — Other Ambulatory Visit (HOSPITAL_COMMUNITY): Payer: Self-pay

## 2022-04-26 ENCOUNTER — Other Ambulatory Visit (HOSPITAL_COMMUNITY): Payer: Self-pay

## 2022-04-26 ENCOUNTER — Other Ambulatory Visit: Payer: Self-pay | Admitting: Student

## 2022-04-26 DIAGNOSIS — E1140 Type 2 diabetes mellitus with diabetic neuropathy, unspecified: Secondary | ICD-10-CM

## 2022-04-26 MED ORDER — MOUNJARO 7.5 MG/0.5ML ~~LOC~~ SOAJ
7.5000 mg | SUBCUTANEOUS | 0 refills | Status: DC
  Filled 2022-04-26: qty 2, 28d supply, fill #0
  Filled 2022-05-22: qty 2, 28d supply, fill #1
  Filled 2022-06-13: qty 2, 28d supply, fill #2
  Filled 2022-07-10: qty 2, 28d supply, fill #3

## 2022-04-27 ENCOUNTER — Other Ambulatory Visit (HOSPITAL_COMMUNITY): Payer: Self-pay

## 2022-05-09 ENCOUNTER — Other Ambulatory Visit (HOSPITAL_COMMUNITY): Payer: Self-pay

## 2022-05-10 ENCOUNTER — Other Ambulatory Visit (HOSPITAL_COMMUNITY): Payer: Self-pay

## 2022-05-11 ENCOUNTER — Other Ambulatory Visit (HOSPITAL_COMMUNITY): Payer: Self-pay

## 2022-05-12 ENCOUNTER — Telehealth: Payer: Self-pay

## 2022-05-12 DIAGNOSIS — I872 Venous insufficiency (chronic) (peripheral): Secondary | ICD-10-CM

## 2022-05-12 NOTE — Telephone Encounter (Signed)
Pt called stating that the varicose veins in his L leg were getting worse.  Reviewed pt's chart, returned call for clarification, two identifiers used. Pt stated that the veins are starting to protrude so you can feel them when touching the leg. Pt was on recall list for 1 yr f/u in October. Scheduled [name] and appt with Dr Scot Dock on 8/30. Confirmed understanding.

## 2022-05-22 ENCOUNTER — Other Ambulatory Visit (HOSPITAL_COMMUNITY): Payer: Self-pay

## 2022-05-23 ENCOUNTER — Other Ambulatory Visit (HOSPITAL_COMMUNITY): Payer: Self-pay

## 2022-05-31 ENCOUNTER — Ambulatory Visit (INDEPENDENT_AMBULATORY_CARE_PROVIDER_SITE_OTHER): Payer: 59 | Admitting: Vascular Surgery

## 2022-05-31 ENCOUNTER — Ambulatory Visit (HOSPITAL_COMMUNITY)
Admission: RE | Admit: 2022-05-31 | Discharge: 2022-05-31 | Disposition: A | Discharge status: Home / Self Care | Payer: 59 | Source: Ambulatory Visit | Attending: Vascular Surgery | Admitting: Vascular Surgery

## 2022-05-31 ENCOUNTER — Encounter: Payer: Self-pay | Admitting: Vascular Surgery

## 2022-05-31 VITALS — BP 120/68 | HR 53 | Temp 98.1°F | Resp 18 | Ht 74.0 in | Wt 190.8 lb

## 2022-05-31 DIAGNOSIS — I872 Venous insufficiency (chronic) (peripheral): Secondary | ICD-10-CM | POA: Diagnosis not present

## 2022-05-31 DIAGNOSIS — I83812 Varicose veins of left lower extremities with pain: Secondary | ICD-10-CM

## 2022-05-31 DIAGNOSIS — I8393 Asymptomatic varicose veins of bilateral lower extremities: Secondary | ICD-10-CM

## 2022-05-31 NOTE — Progress Notes (Signed)
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  He has had previous ablation of the left great saphenous vein.  He has significant reflux in the small saphenous vein and has a vein of [place] which extends up his thigh.  He has some dilated varicose veins in the medial left thigh also.  We have again discussed the importance of leg elevation and the proper positioning for this.  I encouraged him to continue to wear his thigh-high compression stockings.  We discussed the importance of exercise.  I am encouraged him to avoid prolonged sitting and standing.  Given he is having persistent symptoms I think he would be a good candidate for laser ablation of the left small saphenous vein with 10-20 stab phlebectomies.  I have discussed the indications for endovenous laser ablation of the left SSV, that is to lower the pressure in the veins and potentially help relieve the symptoms from venous hypertension.  I have also discussed alternative options such as conservative treatment as described above. I have discussed the potential complications of the procedure, including bleeding, bruising, leg swelling, deep venous thrombosis (<1% risk), or failure of the vein to close <1% risk).  I have also explained that venous insufficiency is a chronic disease, and that the patient is at risk for recurrent varicose veins in the future.  All of the patient's questions were encouraged and answered. They are agreeable to proceed.   I have discussed with the patient the indications for stab phlebectomy.  I have explained to the patient that that will have small scars from the stab incisions.  I explained that the other risks include bruising, bleeding, and phlebitis.   His Eliquis will have to be held prior to the procedure.   HPI:   Trevor Fischer is  a pleasant 61 y.o. male who I last saw on 07/20/2021.  He had been referred with chronic venous insufficiency.  His right great saphenous vein had been previously ablated.  We had discussed possibly ablating the right small saphenous vein if his symptoms in the right leg progressed.  Since I saw him last, he is now having significant symptoms in the left lower extremity with minimal symptoms on the right.  He describes aching pain and heaviness in the left leg which is aggravated by sitting and standing and relieved with elevation.  He has had a previous DVT in the left lower extremity but has had none recently and his duplex scan today does not show evidence of DVT.  He is maintained on Eliquis.  There have been no other significant changes to his medical history.  Past Medical History:  Diagnosis Date   Allergic rhinitis    Degenerative joint disease of knee    DVT (deep venous thrombosis) (HCC) RLE X 2   Ended anticoagulation in 2012   Flatulence 05/13/2015   GERD (gastroesophageal reflux disease)    History of DVT of lower extremity 10/08   HIV infection ([place])    Hx MRSA infection    Hyperlipidemia    Hyperlipidemia 11/17/2021   Hypertension    Pulmonary embolism ([place]) 05/14/2017   Superficial thrombophlebitis    Type II diabetes mellitus ([place]) 9/09    Family History  Problem Relation Age of Onset   Diabetes Father    Kidney disease Father    Heart  failure Father    Hyperlipidemia Father    Hypertension Father    Osteoarthritis Mother     SOCIAL HISTORY: Social History   Tobacco Use   Smoking status: Former    Packs/day: 1.00    Years: 28.00    Total pack years: 28.00    Types: Cigarettes    Quit date: 07/02/2005    Years since quitting: 16.9   Smokeless tobacco: Never  Substance Use Topics   Alcohol use: Yes    Alcohol/week: 0.0 standard drinks of alcohol    Comment: 05/14/2017 "1-2 drinks/year"    Allergies  Allergen Reactions   Famotidine Other (See Comments)     Co-administration will lower complera levels   Amoxicillin-Pot Clavulanate Other (See Comments)   Amoxicillin-Pot Clavulanate Rash   Metformin And Related Diarrhea    Current Outpatient Medications  Medication Sig Dispense Refill   Accu-Chek FastClix Lancets MISC USE TO CHECK BLOOD SUGAR UP TO 4 TIMES A DAY 102 each PRN   ACCU-CHEK GUIDE test strip USE TO CHECK BLOOD SUGAR UP TO 4 TIMES A DAY 400 strip PRN   acetaminophen (TYLENOL) 500 MG tablet Take 2 tablets (1,000 mg total) by mouth 2 (two) times daily. 30 tablet 3   Alcohol Swabs (EASY TOUCH ALCOHOL PREP MEDIUM) 70 % PADS Use as directed up to 10 daily 1000 each 3   apixaban (ELIQUIS) 5 MG TABS tablet Take 1 tablet (5 mg total) by mouth 2 (two) times daily. 180 tablet 2   atorvastatin (LIPITOR) 40 MG tablet Take 1 tablet (40 mg total) by mouth daily. 90 tablet 1   baclofen (LIORESAL) 20 MG tablet Take 1 tablet (20 mg total) by mouth 3 (three) times daily. 90 tablet 2   bictegravir-emtricitabine-tenofovir AF (BIKTARVY) 50-200-25 MG TABS tablet TAKE 1 TABLET BY MOUTH DAILY. 30 tablet 11   Blood Glucose Monitoring Suppl (FREESTYLE LITE) DEVI      chlorpheniramine (CHLOR-TRIMETON) 4 MG tablet Take 1 tablet (4 mg total) by mouth every 4 (four) hours as needed for allergies. 150 tablet 1   Continuous Blood Gluc Receiver ([place]) DEVI Continuously monitor sugars. Please change every 10 days. 9 each 3   Continuous Blood Gluc Sensor (DEXCOM G7 SENSOR) MISC Use as directed to check blood sugar every 10 days. 4 each 3   COVID-19 mRNA Vac-TriS, Pfizer, (PFIZER-BIONT COVID-19 VAC-TRIS) SUSP injection Inject into the muscle. 0.3 mL 0   cyclobenzaprine (AMRIX) 15 MG 24 hr capsule 5 mg     empagliflozin (JARDIANCE) 25 MG TABS tablet Take 1 tablet (25 mg total) by mouth daily before breakfast. 90 tablet 1   FREESTYLE LITE test strip      gabapentin (NEURONTIN) 800 MG tablet Take 1 tablet (800 mg total) by mouth 3 (three) times daily. 270  tablet 1   insulin lispro (HUMALOG) 200 UNIT/ML KwikPen Take as directed: 130-180: Take 2 units,   181-240: Take 4 units,   241-300: Take 6 units,   301-350: Take 8 units,   351-400: Take 10 units 3 mL 2   Insulin Pen Needle 32G X 4 MM MISC Use as directed 6 times daily 100 each 8   losartan (COZAAR) 50 MG tablet Take 1 tablet (50 mg total) by mouth daily. 90 tablet 3   Omega-3 Fatty Acids (FISH OIL) 1000 MG CAPS Take 1 capsule by mouth 2 (two) times daily.     pantoprazole (PROTONIX) 40 MG tablet Take 1 tablet (40 mg total) by mouth daily.  90 tablet 3   sildenafil (VIAGRA) 50 MG tablet Take 1 tablet (50 mg total) by mouth daily as needed for erectile dysfunction. 30 tablet 0   Simethicone 180 MG CAPS Take 1 capsule (180 mg total) by mouth 3 (three) times daily as needed. 60 capsule 3   Tdap (BOOSTRIX) 5-2.5-18.5 LF-MCG/0.5 injection Inject 0.5 mLs into the muscle. 0.5 mL 0   tirzepatide (MOUNJARO) 7.5 MG/0.5ML Pen Inject 7.5 mg into the skin once a week. 8 mL 0   triamterene-hydrochlorothiazide (MAXZIDE) 75-50 MG tablet Take 0.5 tablets by mouth daily. 45 tablet 1   verapamil (CALAN-SR) 240 MG CR tablet Take 1 tablet (240 mg total) by mouth daily. 90 tablet 1   Continuous Blood Gluc Transmit (DEXCOM G6 TRANSMITTER) MISC USE TO CHECK BLOOD SUGAR UP TO 10 TIMES A DAY 1 each 3   Lancets (FREESTYLE) lancets USE TO CHECK BLOOD SUGAR UP TO 4 TIMES A DAY 100 each 99   No current facility-administered medications for this visit.    REVIEW OF SYSTEMS:  '[X]'$  denotes positive finding, '[ ]'$  denotes negative finding Cardiac  Comments:  Chest pain or chest pressure:    Shortness of breath upon exertion:    Short of breath when lying flat:    Irregular heart rhythm:        Vascular    Pain in calf, thigh, or hip brought on by ambulation: x   Pain in feet at night that wakes you up from your sleep:     Blood clot in your veins:    Leg swelling:  x       Pulmonary    Oxygen at home:    Productive  cough:     Wheezing:         Neurologic    Sudden weakness in arms or legs:     Sudden numbness in arms or legs:     Sudden onset of difficulty speaking or slurred speech:    Temporary loss of vision in one eye:     Problems with dizziness:         Gastrointestinal    Blood in stool:     Vomited blood:         Genitourinary    Burning when urinating:     Blood in urine:        Psychiatric    Major depression:         Hematologic    Bleeding problems:    Problems with blood clotting too easily:        Skin    Rashes or ulcers:        Constitutional    Fever or chills:     PHYSICAL EXAM:   Vitals:   05/31/22 1348  BP: 120/68  Pulse: (!) 53  Resp: 18  Temp: 98.1 F (36.7 C)  TempSrc: Temporal  SpO2: 100%  Weight: 190 lb 12.8 oz (86.5 kg)  Height: '6\' 2"'$  (1.88 m)    GENERAL: The patient is a well-nourished male, in no acute distress. The vital signs are documented above. CARDIAC: There is a regular rate and rhythm.  VASCULAR: I do not detect carotid bruits. He has palpable pedal pulses. He has hyperpigmentation bilaterally.  He has some dilated varicose veins in his medial left thigh which are under significant pressure.   PULMONARY: There is good air exchange bilaterally without wheezing or rales. ABDOMEN: Soft and non-tender with normal pitched bowel sounds.  MUSCULOSKELETAL: There are no major  deformities or cyanosis. NEUROLOGIC: No focal weakness or paresthesias are detected. SKIN: There are no ulcers or rashes noted. PSYCHIATRIC: The patient has a normal affect.  DATA:    VENOUS DUPLEX: I have independently interpreted his venous duplex scan today.  This was of the left lower extremity only.  There was no evidence of DVT.  There was deep venous reflux in the common femoral vein and femoral vein.  There was superficial venous reflux in the small saphenous vein from the saphenous popliteal junction to the mid calf.  The vein was about 6 mm in  diameter.     Deitra Mayo [facility] [phone]

## 2022-06-01 ENCOUNTER — Other Ambulatory Visit (HOSPITAL_COMMUNITY): Payer: Self-pay

## 2022-06-02 ENCOUNTER — Other Ambulatory Visit (HOSPITAL_COMMUNITY): Payer: Self-pay

## 2022-06-09 ENCOUNTER — Other Ambulatory Visit (HOSPITAL_COMMUNITY): Payer: Self-pay

## 2022-06-13 ENCOUNTER — Other Ambulatory Visit: Payer: Self-pay | Admitting: Student

## 2022-06-13 ENCOUNTER — Other Ambulatory Visit (HOSPITAL_COMMUNITY): Payer: Self-pay

## 2022-06-13 DIAGNOSIS — G8929 Other chronic pain: Secondary | ICD-10-CM

## 2022-06-13 DIAGNOSIS — E1140 Type 2 diabetes mellitus with diabetic neuropathy, unspecified: Secondary | ICD-10-CM

## 2022-06-14 ENCOUNTER — Other Ambulatory Visit (HOSPITAL_COMMUNITY): Payer: Self-pay

## 2022-06-14 MED ORDER — BACLOFEN 20 MG PO TABS
20.0000 mg | ORAL_TABLET | Freq: Three times a day (TID) | ORAL | 2 refills | Status: DC
  Filled 2022-06-14: qty 90, 30d supply, fill #0
  Filled 2022-08-01: qty 90, 30d supply, fill #1
  Filled 2022-09-04: qty 90, 30d supply, fill #2

## 2022-06-14 MED ORDER — FREESTYLE LANCETS MISC
99 refills | Status: AC
  Filled 2022-06-14 – 2022-08-08 (×2): qty 100, 25d supply, fill #0
  Filled 2023-01-16: qty 100, 25d supply, fill #1

## 2022-06-15 ENCOUNTER — Other Ambulatory Visit (HOSPITAL_COMMUNITY): Payer: Self-pay

## 2022-06-16 ENCOUNTER — Other Ambulatory Visit (HOSPITAL_COMMUNITY): Payer: Self-pay

## 2022-06-19 ENCOUNTER — Other Ambulatory Visit (HOSPITAL_COMMUNITY): Payer: Self-pay

## 2022-06-19 MED ORDER — HUMALOG KWIKPEN 200 UNIT/ML ~~LOC~~ SOPN
PEN_INJECTOR | SUBCUTANEOUS | 2 refills | Status: DC
  Filled 2022-06-19: qty 6, 30d supply, fill #0
  Filled 2022-06-27: qty 3, 30d supply, fill #0
  Filled 2022-08-08: qty 3, 30d supply, fill #1
  Filled 2022-09-11: qty 3, 30d supply, fill #2

## 2022-06-20 ENCOUNTER — Other Ambulatory Visit: Payer: Self-pay | Admitting: *Deleted

## 2022-06-20 ENCOUNTER — Other Ambulatory Visit (HOSPITAL_COMMUNITY): Payer: Self-pay

## 2022-06-20 DIAGNOSIS — I83812 Varicose veins of left lower extremities with pain: Secondary | ICD-10-CM

## 2022-06-23 ENCOUNTER — Other Ambulatory Visit (HOSPITAL_COMMUNITY): Payer: Self-pay

## 2022-06-27 ENCOUNTER — Other Ambulatory Visit: Payer: Self-pay | Admitting: Student

## 2022-06-27 ENCOUNTER — Other Ambulatory Visit (HOSPITAL_COMMUNITY): Payer: Self-pay

## 2022-06-27 DIAGNOSIS — E1140 Type 2 diabetes mellitus with diabetic neuropathy, unspecified: Secondary | ICD-10-CM

## 2022-06-28 ENCOUNTER — Other Ambulatory Visit (HOSPITAL_COMMUNITY): Payer: Self-pay

## 2022-07-10 ENCOUNTER — Other Ambulatory Visit (HOSPITAL_COMMUNITY): Payer: Self-pay

## 2022-07-10 ENCOUNTER — Other Ambulatory Visit: Payer: Self-pay | Admitting: Internal Medicine

## 2022-07-10 DIAGNOSIS — Z794 Long term (current) use of insulin: Secondary | ICD-10-CM

## 2022-07-10 MED ORDER — DEXCOM G7 SENSOR MISC
1.0000 "application " | 3 refills | Status: DC
  Filled 2022-07-10: qty 3, 30d supply, fill #0
  Filled 2022-08-08: qty 3, 30d supply, fill #1
  Filled 2022-09-11: qty 3, 30d supply, fill #2
  Filled 2022-10-08: qty 3, 30d supply, fill #3
  Filled 2022-11-06: qty 3, 30d supply, fill #4
  Filled 2022-12-04: qty 1, 10d supply, fill #5

## 2022-07-12 ENCOUNTER — Other Ambulatory Visit (HOSPITAL_COMMUNITY): Payer: Self-pay

## 2022-07-17 ENCOUNTER — Other Ambulatory Visit (HOSPITAL_COMMUNITY): Payer: Self-pay

## 2022-07-19 ENCOUNTER — Other Ambulatory Visit (HOSPITAL_COMMUNITY): Payer: Self-pay

## 2022-07-26 ENCOUNTER — Encounter: Payer: 59 | Admitting: Student

## 2022-08-01 ENCOUNTER — Other Ambulatory Visit (HOSPITAL_COMMUNITY): Payer: Self-pay

## 2022-08-01 ENCOUNTER — Other Ambulatory Visit: Payer: Self-pay | Admitting: Student

## 2022-08-01 DIAGNOSIS — I10 Essential (primary) hypertension: Secondary | ICD-10-CM

## 2022-08-01 MED ORDER — TRIAMTERENE-HCTZ 75-50 MG PO TABS
0.5000 | ORAL_TABLET | Freq: Every day | ORAL | 2 refills | Status: DC
  Filled 2022-08-01: qty 45, 90d supply, fill #0
  Filled 2022-10-30: qty 45, 90d supply, fill #1
  Filled 2023-02-06: qty 45, 90d supply, fill #2

## 2022-08-02 ENCOUNTER — Other Ambulatory Visit (HOSPITAL_COMMUNITY): Payer: Self-pay

## 2022-08-02 ENCOUNTER — Other Ambulatory Visit: Payer: Self-pay | Admitting: *Deleted

## 2022-08-02 MED ORDER — LORAZEPAM 1 MG PO TABS
1.0000 mg | ORAL_TABLET | Freq: Every day | ORAL | 0 refills | Status: DC
  Filled 2022-08-02: qty 2, 2d supply, fill #0

## 2022-08-02 MED ORDER — LORAZEPAM 1 MG PO TABS: ORAL_TABLET | ORAL | 0 refills | Status: DC

## 2022-08-07 ENCOUNTER — Other Ambulatory Visit (HOSPITAL_COMMUNITY): Payer: Self-pay

## 2022-08-07 ENCOUNTER — Ambulatory Visit (INDEPENDENT_AMBULATORY_CARE_PROVIDER_SITE_OTHER): Payer: 59 | Admitting: Student

## 2022-08-07 ENCOUNTER — Other Ambulatory Visit: Payer: Self-pay

## 2022-08-07 ENCOUNTER — Encounter: Payer: Self-pay | Admitting: Student

## 2022-08-07 VITALS — BP 136/77 | HR 51 | Temp 97.2°F | Resp 28 | Ht 74.0 in | Wt 187.9 lb

## 2022-08-07 DIAGNOSIS — I10 Essential (primary) hypertension: Secondary | ICD-10-CM

## 2022-08-07 DIAGNOSIS — Z87891 Personal history of nicotine dependence: Secondary | ICD-10-CM

## 2022-08-07 DIAGNOSIS — E1140 Type 2 diabetes mellitus with diabetic neuropathy, unspecified: Secondary | ICD-10-CM | POA: Diagnosis not present

## 2022-08-07 DIAGNOSIS — Z794 Long term (current) use of insulin: Secondary | ICD-10-CM

## 2022-08-07 LAB — POCT GLYCOSYLATED HEMOGLOBIN (HGB A1C): Hemoglobin A1C: 7.8 % — AB (ref 4.0–5.6)

## 2022-08-07 LAB — GLUCOSE, CAPILLARY: Glucose-Capillary: 133 mg/dL — ABNORMAL HIGH (ref 70–99)

## 2022-08-07 MED ORDER — EMPAGLIFLOZIN 10 MG PO TABS
10.0000 mg | ORAL_TABLET | Freq: Every day | ORAL | 0 refills | Status: DC
  Filled 2022-08-07: qty 90, 90d supply, fill #0

## 2022-08-07 NOTE — Patient Instructions (Signed)
Mr.Jonpaul L Colasanti, it was a pleasure seeing you today!  Today we discussed: - Diabetes: I am going to decrease the [place] and Jardiance today. In addition, I would like for you to do a sliding scale for your short-acting insulin with meals. Please see the following chart:  70-120: 0 units 121-150: 2 units 151-200: 3 units 201-250: 5 units 251-300: 8 units 301-350: 11 units 351+: 15 units  I have ordered the following labs today:   Lab Orders         Glucose, capillary         BMP8+Anion Gap         POC Hbg A1C      I have ordered the following medication/changed the following medications:   Start the following medications: Meds ordered this encounter  Medications   empagliflozin (JARDIANCE) 10 MG TABS tablet    Sig: Take 1 tablet (10 mg total) by mouth daily before breakfast.    Dispense:  90 tablet    Refill:  0     Follow-up: 3 months   Please make sure to arrive 15 minutes prior to your next appointment. If you arrive late, you may be asked to reschedule.   We look forward to seeing you next time. Please call our clinic at [phone] if you have any questions or concerns. The best time to call is Monday-Friday from 9am-4pm, but there is someone available 24/7. If after hours or the weekend, call the main hospital number and ask for the Internal Medicine Resident On-Call. If you need medication refills, please notify your pharmacy one week in advance and they will send [name] a request.  Thank you for letting us take part in your care. Wishing you the best!  Thank you, Sanjuan Dame, MD

## 2022-08-08 LAB — BMP8+ANION GAP
Anion Gap: 16 mmol/L (ref 10.0–18.0)
BUN/Creatinine Ratio: 13 (ref 10–24)
BUN: 13 mg/dL (ref 8–27)
CO2: 25 mmol/L (ref 20–29)
Calcium: 9.1 mg/dL (ref 8.6–10.2)
Chloride: 100 mmol/L (ref 96–106)
Creatinine, Ser: 1.04 mg/dL (ref 0.76–1.27)
Glucose: 131 mg/dL — ABNORMAL HIGH (ref 70–99)
Potassium: 4.4 mmol/L (ref 3.5–5.2)
Sodium: 141 mmol/L (ref 134–144)
eGFR: 81 mL/min/{1.73_m2} (ref >59)

## 2022-08-09 ENCOUNTER — Encounter: Payer: Self-pay | Admitting: Vascular Surgery

## 2022-08-09 ENCOUNTER — Encounter (HOSPITAL_COMMUNITY): Payer: Self-pay

## 2022-08-09 ENCOUNTER — Ambulatory Visit (INDEPENDENT_AMBULATORY_CARE_PROVIDER_SITE_OTHER): Payer: 59 | Admitting: Vascular Surgery

## 2022-08-09 ENCOUNTER — Other Ambulatory Visit (HOSPITAL_COMMUNITY): Payer: Self-pay

## 2022-08-09 VITALS — BP 125/76 | HR 62 | Temp 97.7°F | Ht 74.0 in | Wt 182.9 lb

## 2022-08-09 DIAGNOSIS — I83812 Varicose veins of left lower extremities with pain: Secondary | ICD-10-CM

## 2022-08-09 HISTORY — PX: ENDOVENOUS ABLATION SAPHENOUS VEIN W/ LASER: SUR449

## 2022-08-09 NOTE — Assessment & Plan Note (Signed)
A1c today 7.8% from 6.9% at last check. [name] reports he has had significant weight loss with [name] and is concerned he is losing too much weight. States that he typically does not use the short-acting insulin every day, more as needed. He also reports polyuria that has occurred primarily over the last 55mo Does not report incomplete emptying or difficulty initiating stream, although does report some dribbling.   Given his concern for weight loss, we will decrease Mounjaro dose at his next re-fill. In addition, we will plan to decrease dose of Jardiance to see if this helps with his urinary issues. Also discussed using sliding scale with meals.  - Decrease tirzepatide to '5mg'$  weekly - Decrease Jardiance to '10mg'$  daily - Continue Humalog sliding scale - Return to clinic in three months

## 2022-08-09 NOTE — Assessment & Plan Note (Signed)
BP Readings from Last 3 Encounters:  08/09/22 125/76  08/07/22 136/77  05/31/22 120/68   Blood pressure at goal today, denies any dizzinesss or lightheadedness on this regimen. Will continue current regimen and check labs today.  - Triamterene-hydrochlorothiazide 37.5-'25mg'$  daily - Verapamil '40mg'$  daily - Losartan '50mg'$  daily - BMP today

## 2022-08-09 NOTE — Progress Notes (Signed)
     Laser Ablation Procedure    Date: 08/09/2022   Trevor Fischer DOB:[birth year]  Consent signed: Yes      Surgeon: Gae Gallop MD   Procedure: Laser Ablation: left Small Saphenous Vein  BP 125/76 (BP Location: Left Arm, Patient Position: Sitting, Cuff Size: Normal)   Pulse 62   Temp 97.7 F (36.5 C) (Temporal)   Ht '6\' 2"'$  (1.88 m)   Wt 182 lb 14.4 oz (83 kg)   SpO2 99%   BMI 23.48 kg/m   Tumescent Anesthesia: 490  cc 0.9% NaCl with 50 cc Lidocaine HCL 1%  and 15 cc 8.4% NaHCO3  Local Anesthesia: 12 cc Lidocaine HCL and NaHCO3 (ratio 2:1)  7 watts continuous mode     Total energy: 1869 Joules     Total time: 267 Joules  Treatment Length 40 cm   Laser Fiber Ref. #  [id number]       Lot #  [id number]   Stab Phlebectomy: 10-20 Sites: Thigh and Calf  Patient tolerated procedure well  Notes:  All staff members wore facial masks. Last dose of Eliquis on 08-06-2022.  [name] took Ativan 1 mg (1 tablet) on 08-09-2022 at 7:30 AM.    Description of Procedure:  After marking the course of the secondary varicosities, the patient was placed on the operating table in the supine position, and the left leg was prepped and draped in sterile fashion.   Local anesthetic was administered and under ultrasound guidance the saphenous vein was accessed with a micro needle and guide wire; then the mirco puncture sheath was placed.  A guide wire was inserted saphenopopliteal junction , followed by a 5 french sheath.  The position of the sheath and then the laser fiber below the junction was confirmed using the ultrasound.  Tumescent anesthesia was administered along the course of the saphenous vein using ultrasound guidance. The patient was placed in Trendelenburg position and protective laser glasses were placed on patient and staff, and the laser was fired at 7 watts continuous mode for a total of 1869 joules.   For stab phlebectomies, local anesthetic was administered at the previously  marked varicosities, and tumescent anesthesia was administered around the vessels.  Ten to 20 stab wounds were made using the tip of an 11 blade. And using the vein hook, the phlebectomies were performed using a hemostat to avulse the varicosities.  Adequate hemostasis was achieved.     Steri strips were applied to the stab wounds and ABD pads and thigh high compression stockings were applied.  Ace wrap bandages were applied over the phlebectomy sites and at the top of the saphenopopliteal junction. Blood loss was less than 15 cc.  Discharge instructions reviewed with patient and hardcopy of discharge instructions given to patient to take home. The patient ambulated out of the operating room having tolerated the procedure well.

## 2022-08-09 NOTE — Progress Notes (Signed)
Patient name: Trevor Fischer MRN: [id number] DOB: [birth year] Sex: male  REASON FOR VISIT: For laser ablation of the left small saphenous vein and 10-20 stabs.  HPI: Trevor Fischer is a 61 y.o. male with CEAP C4 venous disease.  He has had previous laser ablation of the left great saphenous vein.  He now presents for laser ablation of the left small saphenous vein and 10-20 stabs.  Current Outpatient Medications  Medication Sig Dispense Refill   LORazepam (ATIVAN) 1 MG tablet Take 1 tablet 30 minutes prior to leaving house on day of office surgery.  Bring second tablet with you to office on day of office surgery. 2 tablet 0   triamterene-hydrochlorothiazide (MAXZIDE) 75-50 MG tablet Take 0.5 tablets by mouth daily. 45 tablet 2   Accu-Chek FastClix Lancets MISC USE TO CHECK BLOOD SUGAR UP TO 4 TIMES A DAY 102 each PRN   ACCU-CHEK GUIDE test strip USE TO CHECK BLOOD SUGAR UP TO 4 TIMES A DAY 400 strip PRN   acetaminophen (TYLENOL) 500 MG tablet Take 2 tablets (1,000 mg total) by mouth 2 (two) times daily. 30 tablet 3   Alcohol Swabs (EASY TOUCH ALCOHOL PREP MEDIUM) 70 % PADS Use as directed up to 10 daily 1000 each 3   apixaban (ELIQUIS) 5 MG TABS tablet Take 1 tablet (5 mg total) by mouth 2 (two) times daily. (Patient not taking: Reported on 08/09/2022) 180 tablet 2   atorvastatin (LIPITOR) 40 MG tablet Take 1 tablet (40 mg total) by mouth daily. 90 tablet 1   baclofen (LIORESAL) 20 MG tablet Take 1 tablet (20 mg total) by mouth 3 (three) times daily. 90 tablet 2   bictegravir-emtricitabine-tenofovir AF (BIKTARVY) 50-200-25 MG TABS tablet TAKE 1 TABLET BY MOUTH DAILY. 30 tablet 11   Blood Glucose Monitoring Suppl (FREESTYLE LITE) DEVI      chlorpheniramine (CHLOR-TRIMETON) 4 MG tablet Take 1 tablet (4 mg total) by mouth every 4 (four) hours as needed for allergies. 150 tablet 1   Continuous Blood Gluc Receiver ([place]) DEVI Continuously monitor sugars. Please change every  10 days. 9 each 3   Continuous Blood Gluc Sensor (DEXCOM G7 SENSOR) MISC Use as directed to check blood sugar every 10 days. 4 each 3   Continuous Blood Gluc Transmit (DEXCOM G6 TRANSMITTER) MISC USE TO CHECK BLOOD SUGAR UP TO 10 TIMES A DAY 1 each 3   COVID-19 mRNA Vac-TriS, Pfizer, (PFIZER-BIONT COVID-19 VAC-TRIS) SUSP injection Inject into the muscle. 0.3 mL 0   empagliflozin (JARDIANCE) 10 MG TABS tablet Take 1 tablet (10 mg total) by mouth daily before breakfast. 90 tablet 0   FREESTYLE LITE test strip      gabapentin (NEURONTIN) 800 MG tablet Take 1 tablet (800 mg total) by mouth 3 (three) times daily. 270 tablet 1   insulin lispro (HUMALOG KWIKPEN) 200 UNIT/ML KwikPen Take as directed: 130-180: Take 2 units,   181-240: Take 4 units,   241-300: Take 6 units,   301-350: Take 8 units,   351-400: Take 10 units 3 mL 2   Insulin Pen Needle 32G X 4 MM MISC Use as directed 6 times daily 100 each 8   Lancets (FREESTYLE) lancets USE TO CHECK BLOOD SUGAR UP TO 4 TIMES A DAY 100 each 99   LORazepam (ATIVAN) 1 MG tablet Take 1 tablet (1 mg total) by mouth 30 minutes prior to leaving house on day of surgery and bring 2nd tablet to office 2 tablet 0  losartan (COZAAR) 50 MG tablet Take 1 tablet (50 mg total) by mouth daily. 90 tablet 3   Omega-3 Fatty Acids (FISH OIL) 1000 MG CAPS Take 1 capsule by mouth 2 (two) times daily.     pantoprazole (PROTONIX) 40 MG tablet Take 1 tablet (40 mg total) by mouth daily. 90 tablet 3   sildenafil (VIAGRA) 50 MG tablet Take 1 tablet (50 mg total) by mouth daily as needed for erectile dysfunction. 30 tablet 0   Simethicone 180 MG CAPS Take 1 capsule (180 mg total) by mouth 3 (three) times daily as needed. 60 capsule 3   Tdap (BOOSTRIX) 5-2.5-18.5 LF-MCG/0.5 injection Inject 0.5 mLs into the muscle. 0.5 mL 0   tirzepatide (MOUNJARO) 7.5 MG/0.5ML Pen Inject 7.5 mg into the skin once a week. 8 mL 0   verapamil (CALAN-SR) 240 MG CR tablet Take 1 tablet (240 mg total) by  mouth daily. 90 tablet 1   No current facility-administered medications for this visit.    PHYSICAL EXAM: Vitals:   08/09/22 0821  BP: 125/76  Pulse: 62  Temp: 97.7 F (36.5 C)  TempSrc: Temporal  SpO2: 99%  Weight: 182 lb 14.4 oz (83 kg)  Height: '6\' 2"'$  (1.88 m)    PROCEDURE: Laser ablation left small saphenous vein and 10-20 stabs  TECHNIQUE: The patient was taken to the exam room and the dilated varicose veins were marked with the patient standing.  Patient was then placed prone.  I looked at the small saphenous vein with the SonoSite and the patient had a vein of Giacomini extending well up the thigh.  There was a branch which connected with the deep system.  The left leg was prepped and draped in usual sterile fashion.  Under ultrasound guidance, after the skin was anesthetized, I cannulated the small saphenous vein in the mid calf.  Micropuncture sheath was introduced over wire.  I then advanced the J-wire through the small saphenous vein up into the vein of Giacomini well up into the thigh.  The sheath was advanced over the wire and the wire and dilator removed.  The laser fiber was positioned at the end of the sheath and the sheath retracted.  Tumescent anesthesia was then administered circumferentially around the vein.  Patient was placed in Trendelenburg.  Laser ablation was performed of the left small saphenous vein from the mid thigh down to the mid calf.  50 J/cm was used at 9 W.  Attention was then turned to stab phlebectomies.  Approximately 15-20 small stab incisions were made over the marked areas.  The veins were grasped with a "brought above the skin and then bluntly excised with a hemostat.  Pressure was held for hemostasis.  Steri-Strips were applied.  A pressure dressing was applied.  Patient tolerated the procedure well.  He will return next week for follow-up duplex.  Deitra Mayo [facility] [place] [phone]

## 2022-08-09 NOTE — Progress Notes (Signed)
   CC: polyuria  HPI:  Trevor Fischer is a 61 y.o. person with medical history as below presenting to [facility] for polyuria.  Please see problem-based list for further details, assessments, and plans.  Past Medical History:  Diagnosis Date   Allergic rhinitis    Degenerative joint disease of knee    DVT (deep venous thrombosis) (HCC) RLE X 2   Ended anticoagulation in 2012   Flatulence 05/13/2015   GERD (gastroesophageal reflux disease)    History of DVT of lower extremity 10/08   HIV infection ([place])    Hx MRSA infection    Hyperlipidemia    Hyperlipidemia 11/17/2021   Hypertension    Pulmonary embolism ([place]) 05/14/2017   Superficial thrombophlebitis    Type II diabetes mellitus ([place]) 9/09   Review of Systems:  As per HPI  Physical Exam:  Vitals:   08/07/22 0857  BP: 136/77  Pulse: (!) 51  Resp: (!) 28  Temp: (!) 97.2 F (36.2 C)  TempSrc: Oral  SpO2: 100%  Weight: 187 lb 14.4 oz (85.2 kg)  Height: '6\' 2"'$  (1.88 m)   General: Resting comfortably in no acute distress CV: Regular rate, rhythm. No murmurs appreciated. Warm extremities. Pulm: Normal work of breathing on room air. Clear to auscultation bilaterally.  MSK: Normal bulk, tone. No peripheral edema noted. Skin: Warm, dry. No rashes or lesions appreciated.  Neuro: Awake, alert, conversing appropriately. Grossly non-focal. Psych: Normal mood, affect, speech.   Assessment & Plan:   Diabetes mellitus with neuropathy (HCC) A1c today 7.8% from 6.9% at last check. [name] reports he has had significant weight loss with [name] and is concerned he is losing too much weight. States that he typically does not use the short-acting insulin every day, more as needed. He also reports polyuria that has occurred primarily over the last 76mo Does not report incomplete emptying or difficulty initiating stream, although does report some dribbling.   Given his concern for weight loss, we will decrease Mounjaro dose at  his next re-fill. In addition, we will plan to decrease dose of Jardiance to see if this helps with his urinary issues. Also discussed using sliding scale with meals.  - Decrease tirzepatide to '5mg'$  weekly - Decrease Jardiance to '10mg'$  daily - Continue Humalog sliding scale - Return to clinic in three months  Hypertension BP Readings from Last 3 Encounters:  08/09/22 125/76  08/07/22 136/77  05/31/22 120/68   Blood pressure at goal today, denies any dizzinesss or lightheadedness on this regimen. Will continue current regimen and check labs today.  - Triamterene-hydrochlorothiazide 37.5-'25mg'$  daily - Verapamil '40mg'$  daily - Losartan '50mg'$  daily - BMP today  Patient discussed with Dr. HDenita Lung MD Internal Medicine PGY-3 Pager: [phone]

## 2022-08-10 ENCOUNTER — Other Ambulatory Visit (HOSPITAL_COMMUNITY): Payer: Self-pay

## 2022-08-11 NOTE — Progress Notes (Signed)
Internal Medicine Clinic Attending  Case discussed with the resident at the time of the visit.  We reviewed the resident's history and exam and pertinent patient test results.  I agree with the assessment, diagnosis, and plan of care documented in the resident's note.  

## 2022-08-14 ENCOUNTER — Other Ambulatory Visit (HOSPITAL_COMMUNITY): Payer: Self-pay

## 2022-08-15 ENCOUNTER — Other Ambulatory Visit (HOSPITAL_COMMUNITY): Payer: Self-pay

## 2022-08-16 ENCOUNTER — Ambulatory Visit (HOSPITAL_COMMUNITY)
Admission: RE | Admit: 2022-08-16 | Discharge: 2022-08-16 | Disposition: A | Discharge status: Home / Self Care | Payer: 59 | Source: Ambulatory Visit | Attending: Vascular Surgery | Admitting: Vascular Surgery

## 2022-08-16 ENCOUNTER — Ambulatory Visit (INDEPENDENT_AMBULATORY_CARE_PROVIDER_SITE_OTHER): Payer: 59 | Admitting: Vascular Surgery

## 2022-08-16 ENCOUNTER — Encounter: Payer: Self-pay | Admitting: Vascular Surgery

## 2022-08-16 VITALS — BP 126/80 | HR 68 | Temp 98.7°F | Resp 16 | Ht 74.0 in | Wt 180.0 lb

## 2022-08-16 DIAGNOSIS — I872 Venous insufficiency (chronic) (peripheral): Secondary | ICD-10-CM

## 2022-08-16 DIAGNOSIS — I83812 Varicose veins of left lower extremities with pain: Secondary | ICD-10-CM | POA: Insufficient documentation

## 2022-08-16 DIAGNOSIS — I8393 Asymptomatic varicose veins of bilateral lower extremities: Secondary | ICD-10-CM

## 2022-08-16 NOTE — Progress Notes (Signed)
Patient name: Trevor Fischer MRN: [id number] DOB: Feb 09, 1961 Sex: male  REASON FOR VISIT: Follow-up after laser ablation of the left small saphenous vein and 10-20 stabs.  HPI: Trevor Fischer is a 61 y.o. male this patient has C4 venous disease.  He had failed conservative treatment and was felt to be a good candidate for laser ablation of the left small saphenous vein and 10-20 stabs.  This was performed on 08/09/2022.  Of note the patient had a vein of Giacomini extending well up the thigh.  He comes in for a 1 week follow-up visit.  He has no specific complaints.  He had minimal bruising.  He has been wearing his thigh-high compression stockings.  Current Outpatient Medications  Medication Sig Dispense Refill   Accu-Chek FastClix Lancets MISC USE TO CHECK BLOOD SUGAR UP TO 4 TIMES A DAY 102 each PRN   ACCU-CHEK GUIDE test strip USE TO CHECK BLOOD SUGAR UP TO 4 TIMES A DAY 400 strip PRN   acetaminophen (TYLENOL) 500 MG tablet Take 2 tablets (1,000 mg total) by mouth 2 (two) times daily. 30 tablet 3   Alcohol Swabs (EASY TOUCH ALCOHOL PREP MEDIUM) 70 % PADS Use as directed up to 10 daily 1000 each 3   apixaban (ELIQUIS) 5 MG TABS tablet Take 1 tablet (5 mg total) by mouth 2 (two) times daily. 180 tablet 2   atorvastatin (LIPITOR) 40 MG tablet Take 1 tablet (40 mg total) by mouth daily. 90 tablet 1   baclofen (LIORESAL) 20 MG tablet Take 1 tablet (20 mg total) by mouth 3 (three) times daily. 90 tablet 2   bictegravir-emtricitabine-tenofovir AF (BIKTARVY) 50-200-25 MG TABS tablet TAKE 1 TABLET BY MOUTH DAILY. 30 tablet 11   Blood Glucose Monitoring Suppl (FREESTYLE LITE) DEVI      chlorpheniramine (CHLOR-TRIMETON) 4 MG tablet Take 1 tablet (4 mg total) by mouth every 4 (four) hours as needed for allergies. 150 tablet 1   Continuous Blood Gluc Receiver ([place]) DEVI Continuously monitor sugars. Please change every 10 days. 9 each 3   Continuous Blood Gluc Sensor (DEXCOM  G7 SENSOR) MISC Use as directed to check blood sugar every 10 days. 4 each 3   COVID-19 mRNA Vac-TriS, Pfizer, (PFIZER-BIONT COVID-19 VAC-TRIS) SUSP injection Inject into the muscle. 0.3 mL 0   empagliflozin (JARDIANCE) 10 MG TABS tablet Take 1 tablet (10 mg total) by mouth daily before breakfast. 90 tablet 0   FREESTYLE LITE test strip      gabapentin (NEURONTIN) 800 MG tablet Take 1 tablet (800 mg total) by mouth 3 (three) times daily. 270 tablet 1   insulin lispro (HUMALOG KWIKPEN) 200 UNIT/ML KwikPen Take as directed: 130-180: Take 2 units,   181-240: Take 4 units,   241-300: Take 6 units,   301-350: Take 8 units,   351-400: Take 10 units 3 mL 2   Insulin Pen Needle 32G X 4 MM MISC Use as directed 6 times daily 100 each 8   Lancets (FREESTYLE) lancets USE TO CHECK BLOOD SUGAR UP TO 4 TIMES A DAY 100 each 99   LORazepam (ATIVAN) 1 MG tablet Take 1 tablet 30 minutes prior to leaving house on day of office surgery.  Bring second tablet with you to office on day of office surgery. 2 tablet 0   LORazepam (ATIVAN) 1 MG tablet Take 1 tablet (1 mg total) by mouth 30 minutes prior to leaving house on day of surgery and bring 2nd tablet to office  2 tablet 0   losartan (COZAAR) 50 MG tablet Take 1 tablet (50 mg total) by mouth daily. 90 tablet 3   Omega-3 Fatty Acids (FISH OIL) 1000 MG CAPS Take 1 capsule by mouth 2 (two) times daily.     pantoprazole (PROTONIX) 40 MG tablet Take 1 tablet (40 mg total) by mouth daily. 90 tablet 3   sildenafil (VIAGRA) 50 MG tablet Take 1 tablet (50 mg total) by mouth daily as needed for erectile dysfunction. 30 tablet 0   Simethicone 180 MG CAPS Take 1 capsule (180 mg total) by mouth 3 (three) times daily as needed. 60 capsule 3   Tdap (BOOSTRIX) 5-2.5-18.5 LF-MCG/0.5 injection Inject 0.5 mLs into the muscle. 0.5 mL 0   tirzepatide (MOUNJARO) 7.5 MG/0.5ML Pen Inject 7.5 mg into the skin once a week. 8 mL 0   triamterene-hydrochlorothiazide (MAXZIDE) 75-50 MG tablet Take  0.5 tablets by mouth daily. 45 tablet 2   verapamil (CALAN-SR) 240 MG CR tablet Take 1 tablet (240 mg total) by mouth daily. 90 tablet 1   Continuous Blood Gluc Transmit (DEXCOM G6 TRANSMITTER) MISC USE TO CHECK BLOOD SUGAR UP TO 10 TIMES A DAY 1 each 3   No current facility-administered medications for this visit.   REVIEW OF SYSTEMS: Valu.Nieves ] denotes positive finding; [  ] denotes negative finding  CARDIOVASCULAR:  '[ ]'$  chest pain   '[ ]'$  dyspnea on exertion  '[ ]'$  leg swelling  CONSTITUTIONAL:  '[ ]'$  fever   '[ ]'$  chills  PHYSICAL EXAM: Vitals:   08/16/22 1028  BP: 126/80  Pulse: 68  Resp: 16  Temp: 98.7 F (37.1 C)  TempSrc: Temporal  SpO2: 98%  Weight: 180 lb (81.6 kg)  Height: '6\' 2"'$  (1.88 m)   GENERAL: The patient is a well-nourished male, in no acute distress. The vital signs are documented above. CARDIOVASCULAR: There is a regular rate and rhythm. PULMONARY: There is good air exchange bilaterally without wheezing or rales. VASCULAR: On exam, he has no significant bruising.  Steri-Strips are intact.  DATA:  VENOUS DUPLEX: I have independently interpreted his venous duplex scan today.  There is no evidence of DVT in the left lower extremity.  The small saphenous vein is successfully closed from the mid thigh down to the mid calf.  MEDICAL ISSUES:  S/P LASER ABLATION LEFT SMALL SAPHENOUS VEIN/10-20 STABS: Patient is doing well status post laser ablation of the left small saphenous vein and stab phlebectomies.  He will wear his thigh-high compression stocking for 1 more week.  Also encouraged him to continue to elevate his legs.  He may gradually resume his normal activities.  I will see him back as needed.  Deitra Mayo [facility] [place] [phone]

## 2022-08-18 ENCOUNTER — Other Ambulatory Visit: Payer: Self-pay | Admitting: Student

## 2022-08-18 ENCOUNTER — Other Ambulatory Visit (HOSPITAL_COMMUNITY): Payer: Self-pay

## 2022-08-18 DIAGNOSIS — E1140 Type 2 diabetes mellitus with diabetic neuropathy, unspecified: Secondary | ICD-10-CM

## 2022-08-18 MED ORDER — TIRZEPATIDE 5 MG/0.5ML ~~LOC~~ SOAJ
5.0000 mg | SUBCUTANEOUS | 0 refills | Status: DC
  Filled 2022-08-18: qty 2, 28d supply, fill #0
  Filled 2022-09-11: qty 2, 28d supply, fill #1
  Filled 2022-10-08: qty 2, 28d supply, fill #2

## 2022-08-23 ENCOUNTER — Other Ambulatory Visit (HOSPITAL_COMMUNITY): Payer: Self-pay

## 2022-08-23 ENCOUNTER — Other Ambulatory Visit: Payer: Self-pay | Admitting: Student

## 2022-08-23 DIAGNOSIS — E1140 Type 2 diabetes mellitus with diabetic neuropathy, unspecified: Secondary | ICD-10-CM

## 2022-08-23 MED ORDER — EMPAGLIFLOZIN 10 MG PO TABS
10.0000 mg | ORAL_TABLET | Freq: Every day | ORAL | 2 refills | Status: DC
  Filled 2022-08-23 – 2022-10-29 (×2): qty 90, 90d supply, fill #0
  Filled 2023-01-27: qty 30, 30d supply, fill #1
  Filled 2023-01-27: qty 90, 90d supply, fill #1

## 2022-08-23 NOTE — Telephone Encounter (Signed)
Next appt scheduled 11/13/22 with PCP.  

## 2022-08-25 ENCOUNTER — Other Ambulatory Visit (HOSPITAL_COMMUNITY): Payer: Self-pay

## 2022-09-04 ENCOUNTER — Other Ambulatory Visit (HOSPITAL_COMMUNITY): Payer: Self-pay

## 2022-09-04 ENCOUNTER — Other Ambulatory Visit: Payer: Self-pay | Admitting: Student

## 2022-09-04 DIAGNOSIS — D6859 Other primary thrombophilia: Secondary | ICD-10-CM

## 2022-09-04 MED ORDER — APIXABAN 5 MG PO TABS
5.0000 mg | ORAL_TABLET | Freq: Two times a day (BID) | ORAL | 2 refills | Status: DC
  Filled 2022-09-04: qty 180, 90d supply, fill #0
  Filled 2022-12-04: qty 180, 90d supply, fill #1

## 2022-09-05 ENCOUNTER — Other Ambulatory Visit (HOSPITAL_COMMUNITY): Payer: Self-pay

## 2022-09-06 ENCOUNTER — Telehealth: Payer: Self-pay | Admitting: *Deleted

## 2022-09-06 DIAGNOSIS — R3589 Other polyuria: Secondary | ICD-10-CM

## 2022-09-06 NOTE — Telephone Encounter (Signed)
Urology referral has been placed.

## 2022-09-06 NOTE — Telephone Encounter (Signed)
Patient in office with Husband. States he discussed need for urology referral at last visit. C/o urinary frequency and long time to complete voiding. Would like referral to go to Dr. Irine Seal at [facility].

## 2022-09-11 ENCOUNTER — Other Ambulatory Visit (HOSPITAL_COMMUNITY): Payer: Self-pay

## 2022-09-13 ENCOUNTER — Other Ambulatory Visit: Payer: Self-pay

## 2022-09-13 ENCOUNTER — Other Ambulatory Visit (HOSPITAL_COMMUNITY): Payer: Self-pay

## 2022-09-14 ENCOUNTER — Other Ambulatory Visit: Payer: Self-pay | Admitting: Student

## 2022-09-14 DIAGNOSIS — I10 Essential (primary) hypertension: Secondary | ICD-10-CM

## 2022-09-14 MED ORDER — VERAPAMIL HCL ER 240 MG PO TBCR
240.0000 mg | EXTENDED_RELEASE_TABLET | Freq: Every day | ORAL | 1 refills | Status: DC
  Filled 2022-09-14: qty 90, 90d supply, fill #0
  Filled 2022-12-27: qty 90, 90d supply, fill #1

## 2022-09-14 NOTE — Telephone Encounter (Signed)
Next appt scheduled 11/16/22 with PCP.

## 2022-09-15 ENCOUNTER — Other Ambulatory Visit (HOSPITAL_COMMUNITY): Payer: Self-pay

## 2022-09-15 ENCOUNTER — Other Ambulatory Visit: Payer: Self-pay

## 2022-09-26 ENCOUNTER — Other Ambulatory Visit: Payer: Self-pay

## 2022-09-26 ENCOUNTER — Other Ambulatory Visit (HOSPITAL_COMMUNITY): Payer: Self-pay

## 2022-09-26 ENCOUNTER — Other Ambulatory Visit: Payer: Self-pay | Admitting: Student

## 2022-09-26 DIAGNOSIS — E1140 Type 2 diabetes mellitus with diabetic neuropathy, unspecified: Secondary | ICD-10-CM

## 2022-09-27 DIAGNOSIS — H11153 Pinguecula, bilateral: Secondary | ICD-10-CM | POA: Diagnosis not present

## 2022-09-27 DIAGNOSIS — Z7984 Long term (current) use of oral hypoglycemic drugs: Secondary | ICD-10-CM | POA: Diagnosis not present

## 2022-09-27 DIAGNOSIS — E119 Type 2 diabetes mellitus without complications: Secondary | ICD-10-CM | POA: Diagnosis not present

## 2022-09-27 DIAGNOSIS — H40003 Preglaucoma, unspecified, bilateral: Secondary | ICD-10-CM | POA: Diagnosis not present

## 2022-09-27 DIAGNOSIS — H35411 Lattice degeneration of retina, right eye: Secondary | ICD-10-CM | POA: Diagnosis not present

## 2022-09-27 DIAGNOSIS — H2513 Age-related nuclear cataract, bilateral: Secondary | ICD-10-CM | POA: Diagnosis not present

## 2022-09-27 DIAGNOSIS — H524 Presbyopia: Secondary | ICD-10-CM | POA: Diagnosis not present

## 2022-09-27 DIAGNOSIS — H52223 Regular astigmatism, bilateral: Secondary | ICD-10-CM | POA: Diagnosis not present

## 2022-09-27 DIAGNOSIS — H5203 Hypermetropia, bilateral: Secondary | ICD-10-CM | POA: Diagnosis not present

## 2022-09-27 LAB — HM DIABETES EYE EXAM

## 2022-10-03 ENCOUNTER — Other Ambulatory Visit (HOSPITAL_COMMUNITY): Payer: Self-pay

## 2022-10-03 MED ORDER — GABAPENTIN 800 MG PO TABS
800.0000 mg | ORAL_TABLET | Freq: Three times a day (TID) | ORAL | 1 refills | Status: DC
  Filled 2022-10-03: qty 270, 90d supply, fill #0
  Filled 2022-10-30 – 2023-01-09 (×2): qty 270, 90d supply, fill #1

## 2022-10-05 ENCOUNTER — Other Ambulatory Visit (HOSPITAL_BASED_OUTPATIENT_CLINIC_OR_DEPARTMENT_OTHER): Payer: Self-pay

## 2022-10-05 MED ORDER — COVID-19 MRNA VAC-TRIS(PFIZER) 30 MCG/0.3ML IM SUSY
PREFILLED_SYRINGE | INTRAMUSCULAR | 0 refills | Status: DC
  Filled 2022-10-05: qty 0.3, 1d supply, fill #0

## 2022-10-06 ENCOUNTER — Other Ambulatory Visit (HOSPITAL_COMMUNITY): Payer: Self-pay

## 2022-10-06 ENCOUNTER — Other Ambulatory Visit (HOSPITAL_BASED_OUTPATIENT_CLINIC_OR_DEPARTMENT_OTHER): Payer: Self-pay

## 2022-10-08 ENCOUNTER — Other Ambulatory Visit: Payer: Self-pay | Admitting: Student

## 2022-10-08 DIAGNOSIS — E1140 Type 2 diabetes mellitus with diabetic neuropathy, unspecified: Secondary | ICD-10-CM

## 2022-10-09 ENCOUNTER — Other Ambulatory Visit (HOSPITAL_COMMUNITY): Payer: Self-pay

## 2022-10-09 ENCOUNTER — Other Ambulatory Visit: Payer: Self-pay

## 2022-10-11 ENCOUNTER — Other Ambulatory Visit (HOSPITAL_COMMUNITY): Payer: Self-pay

## 2022-10-11 MED ORDER — HUMALOG KWIKPEN 200 UNIT/ML ~~LOC~~ SOPN
PEN_INJECTOR | SUBCUTANEOUS | 2 refills | Status: DC
  Filled 2022-10-11: qty 3, 30d supply, fill #0
  Filled 2022-11-06: qty 3, 30d supply, fill #1
  Filled 2022-11-07: qty 3, 20d supply, fill #1
  Filled 2022-12-04: qty 3, 20d supply, fill #2

## 2022-10-17 ENCOUNTER — Other Ambulatory Visit: Payer: Self-pay | Admitting: Student

## 2022-10-17 ENCOUNTER — Other Ambulatory Visit (HOSPITAL_COMMUNITY): Payer: Self-pay

## 2022-10-17 ENCOUNTER — Other Ambulatory Visit: Payer: Self-pay | Admitting: *Deleted

## 2022-10-17 DIAGNOSIS — G8929 Other chronic pain: Secondary | ICD-10-CM

## 2022-10-17 DIAGNOSIS — E1140 Type 2 diabetes mellitus with diabetic neuropathy, unspecified: Secondary | ICD-10-CM

## 2022-10-17 MED ORDER — BACLOFEN 20 MG PO TABS
20.0000 mg | ORAL_TABLET | Freq: Three times a day (TID) | ORAL | 2 refills | Status: DC
  Filled 2022-10-17: qty 90, 30d supply, fill #0
  Filled 2022-11-16: qty 90, 30d supply, fill #1
  Filled 2022-12-13: qty 90, 30d supply, fill #2

## 2022-10-17 NOTE — Telephone Encounter (Signed)
Patient's husband called in requesting 3 month supply of baclofen. States they will be going out of town 1/20-1/31 and would like refill as soon as possible. States he first called for this on 1/9. No request in Epic.

## 2022-10-24 ENCOUNTER — Other Ambulatory Visit (HOSPITAL_COMMUNITY): Payer: Self-pay

## 2022-10-24 MED ORDER — ATORVASTATIN CALCIUM 40 MG PO TABS
40.0000 mg | ORAL_TABLET | Freq: Every day | ORAL | 1 refills | Status: DC
  Filled 2022-10-24: qty 90, 90d supply, fill #0
  Filled 2023-01-24: qty 90, 90d supply, fill #1

## 2022-10-30 ENCOUNTER — Other Ambulatory Visit: Payer: Self-pay

## 2022-10-30 ENCOUNTER — Other Ambulatory Visit (HOSPITAL_COMMUNITY): Payer: Self-pay

## 2022-10-31 ENCOUNTER — Other Ambulatory Visit (HOSPITAL_COMMUNITY): Payer: Self-pay

## 2022-11-01 ENCOUNTER — Encounter: Payer: Self-pay | Admitting: Dietician

## 2022-11-01 ENCOUNTER — Other Ambulatory Visit (HOSPITAL_COMMUNITY): Payer: Self-pay

## 2022-11-03 ENCOUNTER — Other Ambulatory Visit (HOSPITAL_COMMUNITY): Payer: Self-pay

## 2022-11-06 ENCOUNTER — Other Ambulatory Visit (HOSPITAL_COMMUNITY): Payer: Self-pay

## 2022-11-06 ENCOUNTER — Other Ambulatory Visit: Payer: Self-pay

## 2022-11-06 ENCOUNTER — Other Ambulatory Visit: Payer: Self-pay | Admitting: Student

## 2022-11-06 DIAGNOSIS — E1140 Type 2 diabetes mellitus with diabetic neuropathy, unspecified: Secondary | ICD-10-CM

## 2022-11-07 ENCOUNTER — Other Ambulatory Visit (HOSPITAL_COMMUNITY): Payer: Self-pay

## 2022-11-08 ENCOUNTER — Other Ambulatory Visit: Payer: Self-pay

## 2022-11-08 ENCOUNTER — Other Ambulatory Visit (HOSPITAL_COMMUNITY): Payer: Self-pay

## 2022-11-10 ENCOUNTER — Other Ambulatory Visit (HOSPITAL_COMMUNITY): Payer: Self-pay

## 2022-11-10 MED ORDER — MOUNJARO 5 MG/0.5ML ~~LOC~~ SOAJ
5.0000 mg | SUBCUTANEOUS | 0 refills | Status: DC
  Filled 2022-11-10: qty 6, 84d supply, fill #0

## 2022-11-13 ENCOUNTER — Encounter: Payer: 59 | Admitting: Student

## 2022-11-15 ENCOUNTER — Other Ambulatory Visit (HOSPITAL_COMMUNITY)
Admission: RE | Admit: 2022-11-15 | Discharge: 2022-11-15 | Disposition: A | Discharge status: Home / Self Care | Payer: Commercial Managed Care - PPO | Source: Ambulatory Visit | Attending: Infectious Disease | Admitting: Infectious Disease

## 2022-11-15 ENCOUNTER — Other Ambulatory Visit: Payer: Commercial Managed Care - PPO

## 2022-11-15 ENCOUNTER — Other Ambulatory Visit: Payer: Self-pay

## 2022-11-15 DIAGNOSIS — Z113 Encounter for screening for infections with a predominantly sexual mode of transmission: Secondary | ICD-10-CM | POA: Insufficient documentation

## 2022-11-15 DIAGNOSIS — B20 Human immunodeficiency virus [HIV] disease: Secondary | ICD-10-CM | POA: Insufficient documentation

## 2022-11-15 DIAGNOSIS — Z79899 Other long term (current) drug therapy: Secondary | ICD-10-CM | POA: Diagnosis not present

## 2022-11-16 ENCOUNTER — Other Ambulatory Visit (HOSPITAL_COMMUNITY): Payer: Self-pay

## 2022-11-16 ENCOUNTER — Ambulatory Visit: Payer: Commercial Managed Care - PPO | Admitting: Student

## 2022-11-16 ENCOUNTER — Encounter: Payer: Self-pay | Admitting: Student

## 2022-11-16 VITALS — BP 126/80 | HR 73 | Temp 98.0°F | Ht 74.0 in | Wt 188.4 lb

## 2022-11-16 DIAGNOSIS — R413 Other amnesia: Secondary | ICD-10-CM | POA: Diagnosis not present

## 2022-11-16 DIAGNOSIS — I10 Essential (primary) hypertension: Secondary | ICD-10-CM

## 2022-11-16 DIAGNOSIS — Z87891 Personal history of nicotine dependence: Secondary | ICD-10-CM

## 2022-11-16 DIAGNOSIS — E611 Iron deficiency: Secondary | ICD-10-CM | POA: Diagnosis not present

## 2022-11-16 DIAGNOSIS — E1140 Type 2 diabetes mellitus with diabetic neuropathy, unspecified: Secondary | ICD-10-CM

## 2022-11-16 DIAGNOSIS — Z794 Long term (current) use of insulin: Secondary | ICD-10-CM

## 2022-11-16 DIAGNOSIS — R718 Other abnormality of red blood cells: Secondary | ICD-10-CM

## 2022-11-16 LAB — POCT GLYCOSYLATED HEMOGLOBIN (HGB A1C): Hemoglobin A1C: 7.3 % — AB (ref 4.0–5.6)

## 2022-11-16 LAB — GLUCOSE, CAPILLARY: Glucose-Capillary: 93 mg/dL (ref 70–99)

## 2022-11-16 LAB — T-HELPER CELL (CD4) - (RCID CLINIC ONLY)
CD4 % Helper T Cell: 43 % (ref 33–65)
CD4 T Cell Abs: 713 /uL (ref 400–1790)

## 2022-11-16 LAB — URINE CYTOLOGY ANCILLARY ONLY
Chlamydia: NEGATIVE
Comment: NEGATIVE
Comment: NORMAL
Neisseria Gonorrhea: NEGATIVE

## 2022-11-16 NOTE — Patient Instructions (Signed)
[name], it was a pleasure seeing you today!  Today we discussed: - We will test for reversible causes of memory issues today with thyroid and vitamin B12 studies. I will also check iron studies to see if your iron is low. Some foods you can eat to increase your iron include green vegetables, beans, and fruit.  I have ordered the following labs today:   Lab Orders         Glucose, capillary         TSH         Vitamin B12         Iron, TIBC and Ferritin Panel         POC Hbg A1C      Follow-up: 4 months   Please make sure to arrive 15 minutes prior to your next appointment. If you arrive late, you may be asked to reschedule.   We look forward to seeing you next time. Please call our clinic at [phone] if you have any questions or concerns. The best time to call is Monday-Friday from 9am-4pm, but there is someone available 24/7. If after hours or the weekend, call the main hospital number and ask for the Internal Medicine Resident On-Call. If you need medication refills, please notify your pharmacy one week in advance and they will send [name] a request.  Thank you for letting us take part in your care. Wishing you the best!  Thank you, Sanjuan Dame, MD

## 2022-11-16 NOTE — Progress Notes (Signed)
   CC: follow-up  HPI:  Trevor Fischer is a 62 y.o. person with medical history as below presenting to [facility] for follow-up.  Please see problem-based list for further details, assessments, and plans.  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 No murmurs appreciated.  Pulm: Normal work of breathing on room air. Clear to auscultation bilaterally.  MSK: Normal bulk, tone. No peripheral edema noted. Skin: Warm, dry. No rashes or lesions. Neuro: Awake, alert, conversing appropriately.  Psych: Normal mood, affect, speech.  Assessment & Plan:   Hypertension BP Readings from Last 3 Encounters:  11/16/22 126/80  08/16/22 126/80  08/09/22 125/76   Blood pressure continues to be at goal, tolerating medications well. Recently had CMP checked with infectious disease, which appears to be normal.  - Triamterene-hydrochlorothiazide 37.5-240mg daily - Verapamil 53m daily - Losartan 563mdaily  Diabetes mellitus with neuropathy (HCC) A1c 7.3% from 7.8% today. [facility] sliding scale as needed with meals. Reports sugars will sometimes spike in 200's but never has any hypoglycemic episodes.  He denies polyuria, polydipsia, vision changes. We will continue with his current regimen. At next visit if A1c remains appropriate could consider discontinuing Humalog sliding scale.  - Tirzepatide 40m86meekly - Jardiance 6m1mily - Humalog sliding scale - If remains controlled would d/c Humalog at next visit  Low mean corpuscular volume (MCV) On most recent lab work found to have low MCV of 77. He is not anemic, Hgb appropriate. No known melena or hematochezia, no dyspnea, lightheadedness. He is up to date on his colonoscopy, last a few years ago in 2021. We will check iron studies today.  - Follow-up iron studies  Memory changes Mr. TaylSalserpresenting today to discuss recent memory issues. He describes having trouble remembering things that occurred recently, mostly small minute things. Has not had issues with walking, functioning appropriately. He is also concerned that his partner is also having memory problems. Notes that there are multiple family members with dementia, would like to be screened for this. Today Mini-Cog 5/5. We will plan to check for reversible causes today. Would continue to monitor this.  - Follow-up TSH, vitamin B12 - Repeat mini-cog at next visit  Patient discussed with Dr. MullAndrey Spearman Internal Medicine PGY-3 Pager: [phone]

## 2022-11-17 ENCOUNTER — Other Ambulatory Visit (HOSPITAL_COMMUNITY): Payer: Self-pay

## 2022-11-17 DIAGNOSIS — R413 Other amnesia: Secondary | ICD-10-CM | POA: Insufficient documentation

## 2022-11-17 DIAGNOSIS — R718 Other abnormality of red blood cells: Secondary | ICD-10-CM | POA: Insufficient documentation

## 2022-11-17 HISTORY — DX: Other amnesia: R41.3

## 2022-11-17 NOTE — Assessment & Plan Note (Signed)
Trevor Fischer is presenting today to discuss recent memory issues. He describes having trouble remembering things that occurred recently, mostly small minute things. Has not had issues with walking, functioning appropriately. He is also concerned that his partner is also having memory problems. Notes that there are multiple family members with dementia, would like to be screened for this. Today Mini-Cog 5/5. We will plan to check for reversible causes today. Would continue to monitor this.  - Follow-up TSH, vitamin B12 - Repeat mini-cog at next visit

## 2022-11-17 NOTE — Assessment & Plan Note (Signed)
On most recent lab work found to have low MCV of 77. He is not anemic, Hgb appropriate. No known melena or hematochezia, no dyspnea, lightheadedness. He is up to date on his colonoscopy, last a few years ago in 2021. We will check iron studies today.  - Follow-up iron studies

## 2022-11-17 NOTE — Assessment & Plan Note (Signed)
A1c 7.3% from 7.8% today. He continues to do well with the [facility]. He still takes Humalog sliding scale as needed with meals. Reports sugars will sometimes spike in 200's but never has any hypoglycemic episodes. He denies polyuria, polydipsia, vision changes. We will continue with his current regimen. At next visit if A1c remains appropriate could consider discontinuing Humalog sliding scale.  - Tirzepatide 94m weekly - Jardiance 189mdaily - Humalog sliding scale - If remains controlled would d/c Humalog at next visit

## 2022-11-17 NOTE — Assessment & Plan Note (Signed)
BP Readings from Last 3 Encounters:  11/16/22 126/80  08/16/22 126/80  08/09/22 125/76   Blood pressure continues to be at goal, tolerating medications well. Recently had CMP checked with infectious disease, which appears to be normal.  - Triamterene-hydrochlorothiazide 37.5-25mg daily - Verapamil 26m daily - Losartan 561mdaily

## 2022-11-18 LAB — IRON,TIBC AND FERRITIN PANEL
Ferritin: 50 ng/mL (ref 30–400)
Iron Saturation: 14 % — ABNORMAL LOW (ref 15–55)
Iron: 55 ug/dL (ref 38–169)
Total Iron Binding Capacity: 396 ug/dL (ref 250–450)
UIBC: 341 ug/dL (ref 111–343)

## 2022-11-18 LAB — CBC WITH DIFFERENTIAL/PLATELET
Absolute Monocytes: 378 cells/uL (ref 200–950)
Basophils Absolute: 50 cells/uL (ref 0–200)
Basophils Relative: 1.2 %
Eosinophils Absolute: 101 cells/uL (ref 15–500)
Eosinophils Relative: 2.4 %
HCT: 43.9 % (ref 38.5–50.0)
Hemoglobin: 14.4 g/dL (ref 13.2–17.1)
Lymphs Abs: 1827 cells/uL (ref 850–3900)
MCH: 25.5 pg — ABNORMAL LOW (ref 27.0–33.0)
MCHC: 32.8 g/dL (ref 32.0–36.0)
MCV: 77.8 fL — ABNORMAL LOW (ref 80.0–100.0)
MPV: 9.3 fL (ref 7.5–12.5)
Monocytes Relative: 9 %
Neutro Abs: 1844 cells/uL (ref 1500–7800)
Neutrophils Relative %: 43.9 %
Platelets: 242 10*3/uL (ref 140–400)
RBC: 5.64 10*6/uL (ref 4.20–5.80)
RDW: 13.1 % (ref 11.0–15.0)
Total Lymphocyte: 43.5 %
WBC: 4.2 10*3/uL (ref 3.8–10.8)

## 2022-11-18 LAB — LIPID PANEL
Cholesterol: 146 mg/dL (ref <200)
HDL: 57 mg/dL (ref >=40)
LDL Cholesterol (Calc): 75 mg/dL (calc)
Non-HDL Cholesterol (Calc): 89 mg/dL (calc) (ref <130)
Total CHOL/HDL Ratio: 2.6 (calc) (ref <5.0)
Triglycerides: 59 mg/dL (ref <150)

## 2022-11-18 LAB — COMPLETE METABOLIC PANEL WITH GFR
AG Ratio: 1.8 (calc) (ref 1.0–2.5)
ALT: 40 U/L (ref 9–46)
AST: 31 U/L (ref 10–35)
Albumin: 4.8 g/dL (ref 3.6–5.1)
Alkaline phosphatase (APISO): 93 U/L (ref 35–144)
BUN: 21 mg/dL (ref 7–25)
CO2: 31 mmol/L (ref 20–32)
Calcium: 9.5 mg/dL (ref 8.6–10.3)
Chloride: 100 mmol/L (ref 98–110)
Creat: 1.14 mg/dL (ref 0.70–1.35)
Globulin: 2.6 g/dL (calc) (ref 1.9–3.7)
Glucose, Bld: 135 mg/dL — ABNORMAL HIGH (ref 65–99)
Potassium: 4 mmol/L (ref 3.5–5.3)
Sodium: 139 mmol/L (ref 135–146)
Total Bilirubin: 0.6 mg/dL (ref 0.2–1.2)
Total Protein: 7.4 g/dL (ref 6.1–8.1)
eGFR: 72 mL/min/{1.73_m2} (ref >=60)

## 2022-11-18 LAB — VITAMIN B12: Vitamin B-12: 1471 pg/mL — ABNORMAL HIGH (ref 232–1245)

## 2022-11-18 LAB — HIV-1 RNA QUANT-NO REFLEX-BLD
HIV 1 RNA Quant: NOT DETECTED Copies/mL
HIV-1 RNA Quant, Log: NOT DETECTED Log cps/mL

## 2022-11-18 LAB — RPR: RPR Ser Ql: NONREACTIVE

## 2022-11-18 LAB — TSH: TSH: 1.95 u[IU]/mL (ref 0.450–4.500)

## 2022-11-20 ENCOUNTER — Other Ambulatory Visit (HOSPITAL_COMMUNITY): Payer: Self-pay

## 2022-11-20 ENCOUNTER — Encounter: Payer: Self-pay | Admitting: Student

## 2022-11-20 MED ORDER — POLYSACCHARIDE IRON COMPLEX 150 MG PO CAPS
150.0000 mg | ORAL_CAPSULE | Freq: Every day | ORAL | 0 refills | Status: DC
  Filled 2022-11-20: qty 90, 90d supply, fill #0

## 2022-11-20 NOTE — Addendum Note (Signed)
Addended bySanjuan Dame on: 11/20/2022 11:48 AM   Modules accepted: Orders

## 2022-11-28 NOTE — Progress Notes (Signed)
Internal Medicine Clinic Attending ? ?Case discussed with Dr. Braswell  at the time of the visit.  We reviewed the resident?s history and exam and pertinent patient test results.  I agree with the assessment, diagnosis, and plan of care documented in the resident?s note.  ?

## 2022-11-28 NOTE — Addendum Note (Signed)
Addended by: Gilles Chiquito B on: 11/28/2022 09:52 AM   Modules accepted: Level of Service

## 2022-11-29 ENCOUNTER — Ambulatory Visit: Payer: Commercial Managed Care - PPO | Admitting: Infectious Disease

## 2022-11-29 ENCOUNTER — Other Ambulatory Visit: Payer: Self-pay

## 2022-11-29 ENCOUNTER — Encounter: Payer: Self-pay | Admitting: Infectious Disease

## 2022-11-29 ENCOUNTER — Other Ambulatory Visit (HOSPITAL_COMMUNITY): Payer: Self-pay

## 2022-11-29 VITALS — BP 125/2 | HR 75 | Temp 98.0°F | Ht 74.0 in | Wt 185.0 lb

## 2022-11-29 DIAGNOSIS — I10 Essential (primary) hypertension: Secondary | ICD-10-CM | POA: Diagnosis not present

## 2022-11-29 DIAGNOSIS — Z7185 Encounter for immunization safety counseling: Secondary | ICD-10-CM | POA: Diagnosis not present

## 2022-11-29 DIAGNOSIS — E1140 Type 2 diabetes mellitus with diabetic neuropathy, unspecified: Secondary | ICD-10-CM

## 2022-11-29 DIAGNOSIS — B20 Human immunodeficiency virus [HIV] disease: Secondary | ICD-10-CM | POA: Diagnosis not present

## 2022-11-29 DIAGNOSIS — Z794 Long term (current) use of insulin: Secondary | ICD-10-CM | POA: Diagnosis not present

## 2022-11-29 DIAGNOSIS — E785 Hyperlipidemia, unspecified: Secondary | ICD-10-CM

## 2022-11-29 DIAGNOSIS — D6859 Other primary thrombophilia: Secondary | ICD-10-CM

## 2022-11-29 MED ORDER — BICTEGRAVIR-EMTRICITAB-TENOFOV 50-200-25 MG PO TABS
1.0000 | ORAL_TABLET | Freq: Every day | ORAL | 11 refills | Status: DC
  Filled 2022-11-29 – 2022-11-30 (×2): qty 30, 30d supply, fill #0
  Filled 2022-12-27: qty 30, 30d supply, fill #1
  Filled 2023-01-25: qty 30, 30d supply, fill #2
  Filled 2023-03-19: qty 30, 30d supply, fill #3
  Filled 2023-04-12: qty 30, 30d supply, fill #4
  Filled 2023-05-11: qty 30, 30d supply, fill #5
  Filled 2023-07-04: qty 30, 30d supply, fill #6
  Filled 2023-08-02: qty 30, 30d supply, fill #7
  Filled 2023-09-03: qty 30, 30d supply, fill #8
  Filled 2023-10-05: qty 30, 30d supply, fill #9
  Filled 2023-11-05: qty 30, 30d supply, fill #10

## 2022-11-29 NOTE — Progress Notes (Signed)
Chief complaint: Follow-up for HIV disease on medications s   Patient ID: JERIMI WRITE, male    DOB: May 06, 1961, 62 y.o.   MRN: [id number]  HPI Trevor Fischer is a 62 y.o. male who continues to do well on BIKTARVY with  undetectable viral load and health cd4 count.  Trevor Fischer is also been on Mounjaro with dramatic weight loss.  He is continue be followed internal medicine has less insulin requirements.    Past Medical History:  Diagnosis Date   Allergic rhinitis    Degenerative joint disease of knee    DVT (deep venous thrombosis) ([place]) RLE X 2   Ended anticoagulation in 2012   Flatulence 05/13/2015   GERD (gastroesophageal reflux disease)    History of DVT of lower extremity 10/08   HIV infection (HCC)    Hx MRSA infection    Hyperlipidemia    Hyperlipidemia 11/17/2021   Hypertension    Pulmonary embolism ([place]) 05/14/2017   Superficial thrombophlebitis    Type II diabetes mellitus ([place]) 9/09    Past Surgical History:  Procedure Laterality Date   ENDOVENOUS ABLATION SAPHENOUS VEIN W/ LASER  07-17-2011 LEFT GRERATER SAPHENOUS VEIN AND STAB PHLEBECTOMIES   10-20   LEFT LEG   ENDOVENOUS ABLATION SAPHENOUS VEIN W/ LASER Left 08/09/2022   endovenous laser ablation left small saphenous vein and stab phlebectomy 10-20 incisions left leg by Gae Gallop MD   VEIN LIGATION AND STRIPPING Bilateral     Family History  Problem Relation Age of Onset   Diabetes Father    Kidney disease Father    Heart failure Father    Hyperlipidemia Father    Hypertension Father    Osteoarthritis Mother       Social History   Socioeconomic History   Marital status: Married    Spouse name: Not on file   Number of children: Not on file   Years of education: Not on file   Highest education level: Not on file  Occupational History   Not on file  Tobacco Use   Smoking status: Former    Packs/day: 1.00    Years: 28.00    Total pack years: 28.00    Types: Cigarettes     Quit date: 07/02/2005    Years since quitting: 17.4   Smokeless tobacco: Never  Vaping Use   Vaping Use: Never used  Substance and Sexual Activity   Alcohol use: Yes    Alcohol/week: 0.0 standard drinks of alcohol    Comment: 05/14/2017 "1-2 drinks/year"   Drug use: No   Sexual activity: Not Currently    Partners: Male    Comment: declined condoms  Other Topics Concern   Not on file  Social History Narrative   Lives in [place], with partner of 27 years    Works as Chartered certified accountant at [employer] Strain: Not on file  Food Insecurity: No Food Insecurity (08/07/2022)   Hunger Vital Sign    Worried About Running Out of Food in the Last Year: Never true    Ran Out of Food in the Last Year: Never true  Transportation Needs: Not on file  Physical Activity: Not on file  Stress: Not on file  Social Connections: Not on file    Allergies  Allergen Reactions   Famotidine Other (See Comments)    Co-administration will lower complera levels   Amoxicillin-Pot Clavulanate Other (See Comments)  Amoxicillin-Pot Clavulanate Rash   Metformin And Related Diarrhea     Current Outpatient Medications:    Accu-Chek FastClix Lancets MISC, USE TO CHECK BLOOD SUGAR UP TO 4 TIMES A DAY, Disp: 102 each, Rfl: PRN   ACCU-CHEK GUIDE test strip, USE TO CHECK BLOOD SUGAR UP TO 4 TIMES A DAY, Disp: 400 strip, Rfl: PRN   acetaminophen (TYLENOL) 500 MG tablet, Take 2 tablets (1,000 mg total) by mouth 2 (two) times daily., Disp: 30 tablet, Rfl: 3   Alcohol Swabs (EASY TOUCH ALCOHOL PREP MEDIUM) 70 % PADS, Use as directed up to 10 daily, Disp: 1000 each, Rfl: 3   apixaban (ELIQUIS) 5 MG TABS tablet, Take 1 tablet (5 mg total) by mouth 2 (two) times daily., Disp: 180 tablet, Rfl: 2   atorvastatin (LIPITOR) 40 MG tablet, Take 1 tablet (40 mg total) by mouth daily., Disp: 90 tablet, Rfl: 1   baclofen (LIORESAL) 20 MG tablet, Take 1  tablet (20 mg total) by mouth 3 (three) times daily., Disp: 90 tablet, Rfl: 2   bictegravir-emtricitabine-tenofovir AF (BIKTARVY) 50-200-25 MG TABS tablet, TAKE 1 TABLET BY MOUTH DAILY., Disp: 30 tablet, Rfl: 11   Blood Glucose Monitoring Suppl (FREESTYLE LITE) DEVI, , Disp: , Rfl:    chlorpheniramine (CHLOR-TRIMETON) 4 MG tablet, Take 1 tablet (4 mg total) by mouth every 4 (four) hours as needed for allergies., Disp: 150 tablet, Rfl: 1   Continuous Blood Gluc Receiver ([place]) DEVI, Continuously monitor sugars. Please change every 10 days., Disp: 9 each, Rfl: 3   Continuous Blood Gluc Sensor (DEXCOM G7 SENSOR) MISC, Use as directed to check blood sugar every 10 days., Disp: 4 each, Rfl: 3   Continuous Blood Gluc Transmit (DEXCOM G6 TRANSMITTER) MISC, USE TO CHECK BLOOD SUGAR UP TO 10 TIMES A DAY, Disp: 1 each, Rfl: 3   COVID-19 mRNA Vac-TriS, Pfizer, (PFIZER-BIONT COVID-19 VAC-TRIS) SUSP injection, Inject into the muscle., Disp: 0.3 mL, Rfl: 0   COVID-19 mRNA vaccine 2023-2024 (COMIRNATY) syringe, Inject into the muscle., Disp: 0.3 mL, Rfl: 0   empagliflozin (JARDIANCE) 10 MG TABS tablet, Take 1 tablet (10 mg total) by mouth daily before breakfast., Disp: 90 tablet, Rfl: 2   FREESTYLE LITE test strip, , Disp: , Rfl:    gabapentin (NEURONTIN) 800 MG tablet, Take 1 tablet (800 mg total) by mouth 3 (three) times daily., Disp: 270 tablet, Rfl: 1   insulin lispro (HUMALOG KWIKPEN) 200 UNIT/ML KwikPen, Take as directed: 130-180: Take 2 units,   181-240: Take 4 units,   241-300: Take 6 units,   301-350: Take 8 units,   351-400: Take 10 units, Disp: 3 mL, Rfl: 2   Insulin Pen Needle 32G X 4 MM MISC, Use as directed 6 times daily, Disp: 100 each, Rfl: 8   iron polysaccharides (NIFEREX) 150 MG capsule, Take 1 capsule (150 mg total) by mouth daily., Disp: 90 capsule, Rfl: 0   Lancets (FREESTYLE) lancets, USE TO CHECK BLOOD SUGAR UP TO 4 TIMES A DAY, Disp: 100 each, Rfl: 99   LORazepam (ATIVAN) 1 MG  tablet, Take 1 tablet 30 minutes prior to leaving house on day of office surgery.  Bring second tablet with you to office on day of office surgery., Disp: 2 tablet, Rfl: 0   LORazepam (ATIVAN) 1 MG tablet, Take 1 tablet (1 mg total) by mouth 30 minutes prior to leaving house on day of surgery and bring 2nd tablet to office, Disp: 2 tablet, Rfl: 0   losartan (  COZAAR) 50 MG tablet, Take 1 tablet (50 mg total) by mouth daily., Disp: 90 tablet, Rfl: 3   Omega-3 Fatty Acids (FISH OIL) 1000 MG CAPS, Take 1 capsule by mouth 2 (two) times daily., Disp: , Rfl:    pantoprazole (PROTONIX) 40 MG tablet, Take 1 tablet (40 mg total) by mouth daily., Disp: 90 tablet, Rfl: 3   sildenafil (VIAGRA) 50 MG tablet, Take 1 tablet (50 mg total) by mouth daily as needed for erectile dysfunction., Disp: 30 tablet, Rfl: 0   Simethicone 180 MG CAPS, Take 1 capsule (180 mg total) by mouth 3 (three) times daily as needed., Disp: 60 capsule, Rfl: 3   Tdap (BOOSTRIX) 5-2.5-18.5 LF-MCG/0.5 injection, Inject 0.5 mLs into the muscle., Disp: 0.5 mL, Rfl: 0   tirzepatide (MOUNJARO) 5 MG/0.5ML Pen, Inject 5 mg into the skin once a week., Disp: 6 mL, Rfl: 0   triamterene-hydrochlorothiazide (MAXZIDE) 75-50 MG tablet, Take 0.5 tablets by mouth daily., Disp: 45 tablet, Rfl: 2   verapamil (CALAN-SR) 240 MG CR tablet, Take 1 tablet (240 mg total) by mouth daily., Disp: 90 tablet, Rfl: 1   Review of Systems  Constitutional:  Negative for activity change, appetite change, chills, diaphoresis, fatigue, fever and unexpected weight change.  HENT:  Negative for congestion, rhinorrhea, sinus pressure, sneezing, sore throat and trouble swallowing.   Eyes:  Negative for photophobia and visual disturbance.  Respiratory:  Negative for cough, chest tightness, shortness of breath, wheezing and stridor.   Cardiovascular:  Negative for chest pain, palpitations and leg swelling.  Gastrointestinal:  Negative for abdominal distention, abdominal pain, anal  bleeding, blood in stool, constipation, diarrhea, nausea and vomiting.  Genitourinary:  Negative for difficulty urinating, dysuria, flank pain and hematuria.  Musculoskeletal:  Negative for arthralgias, back pain, gait problem, joint swelling and myalgias.  Skin:  Negative for color change, pallor, rash and wound.  Neurological:  Negative for dizziness, tremors, weakness and light-headedness.  Hematological:  Negative for adenopathy. Does not bruise/bleed easily.  Psychiatric/Behavioral:  Negative for agitation, behavioral problems, confusion, decreased concentration, dysphoric mood and sleep disturbance.        Objective:   Physical Exam Constitutional:      Appearance: He is well-developed.  HENT:     Head: Normocephalic and atraumatic.  Eyes:     Conjunctiva/sclera: Conjunctivae normal.  Cardiovascular:     Rate and Rhythm: Normal rate and regular rhythm.  Pulmonary:     Effort: Pulmonary effort is normal. No respiratory distress.     Breath sounds: No wheezing.  Abdominal:     General: There is no distension.     Palpations: Abdomen is soft.  Musculoskeletal:        General: No tenderness. Normal range of motion.     Cervical back: Normal range of motion and neck supple.  Skin:    General: Skin is warm and dry.     Coloration: Skin is not pale.     Findings: No erythema or rash.  Neurological:     Mental Status: He is alert and oriented to person, place, and time.  Psychiatric:        Mood and Affect: Mood normal.        Behavior: Behavior normal.        Thought Content: Thought content normal.        Judgment: Judgment normal.           Assessment & Plan:    HIV disease:  I have reviewed Trevor Fischer's labs including viral load which was  Lab Results  Component Value Date   HIV1RNAQUANT Not Detected 11/15/2022   and cd4 which was  Lab Results  Component Value Date   CD4TABS 713 11/15/2022     I am continuing patient's prescription for  Biktarvy  IDDM:  He always seem to have more of a type I presentation when I initially saw his diabetes mellitus itself he is needing less insulin now though on Mounjaro.   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

## 2022-11-30 ENCOUNTER — Other Ambulatory Visit (HOSPITAL_COMMUNITY): Payer: Self-pay

## 2022-12-01 ENCOUNTER — Other Ambulatory Visit (HOSPITAL_COMMUNITY): Payer: Self-pay

## 2022-12-04 ENCOUNTER — Other Ambulatory Visit: Payer: Self-pay | Admitting: Internal Medicine

## 2022-12-04 ENCOUNTER — Other Ambulatory Visit: Payer: Self-pay

## 2022-12-04 ENCOUNTER — Other Ambulatory Visit (HOSPITAL_COMMUNITY): Payer: Self-pay

## 2022-12-04 DIAGNOSIS — E1140 Type 2 diabetes mellitus with diabetic neuropathy, unspecified: Secondary | ICD-10-CM

## 2022-12-05 ENCOUNTER — Other Ambulatory Visit (HOSPITAL_COMMUNITY): Payer: Self-pay

## 2022-12-05 MED ORDER — DEXCOM G7 SENSOR MISC
1.0000 "application " | 3 refills | Status: DC
  Filled 2022-12-05: qty 3, 30d supply, fill #0

## 2022-12-07 ENCOUNTER — Other Ambulatory Visit: Payer: Self-pay

## 2022-12-07 ENCOUNTER — Other Ambulatory Visit (HOSPITAL_COMMUNITY): Payer: Self-pay

## 2022-12-07 DIAGNOSIS — E1140 Type 2 diabetes mellitus with diabetic neuropathy, unspecified: Secondary | ICD-10-CM

## 2022-12-07 DIAGNOSIS — D6859 Other primary thrombophilia: Secondary | ICD-10-CM

## 2022-12-07 MED ORDER — APIXABAN 5 MG PO TABS
5.0000 mg | ORAL_TABLET | Freq: Two times a day (BID) | ORAL | 2 refills | Status: DC
  Filled 2022-12-07 – 2023-03-02 (×2): qty 180, 90d supply, fill #0
  Filled 2023-06-01: qty 180, 90d supply, fill #1
  Filled 2023-07-05 – 2023-09-18 (×2): qty 180, 90d supply, fill #2

## 2022-12-07 MED ORDER — HUMALOG KWIKPEN 200 UNIT/ML ~~LOC~~ SOPN
PEN_INJECTOR | SUBCUTANEOUS | 2 refills | Status: DC
  Filled 2022-12-07: qty 3, fill #0
  Filled 2022-12-27: qty 3, 20d supply, fill #0
  Filled 2023-01-16: qty 3, 20d supply, fill #1
  Filled 2023-02-06: qty 3, 20d supply, fill #2

## 2022-12-07 MED ORDER — DEXCOM G7 SENSOR MISC
1.0000 "application " | 3 refills | Status: DC
  Filled 2022-12-07: qty 4, 40d supply, fill #0
  Filled 2023-01-09: qty 3, 30d supply, fill #0
  Filled 2023-02-06: qty 3, 30d supply, fill #1
  Filled 2023-03-20: qty 3, 30d supply, fill #2
  Filled 2023-04-27: qty 3, 30d supply, fill #3
  Filled 2023-05-27: qty 3, 30d supply, fill #4
  Filled 2023-06-25: qty 1, 10d supply, fill #5

## 2022-12-08 ENCOUNTER — Other Ambulatory Visit: Payer: Self-pay

## 2022-12-11 ENCOUNTER — Other Ambulatory Visit (HOSPITAL_COMMUNITY): Payer: Self-pay

## 2022-12-11 ENCOUNTER — Other Ambulatory Visit: Payer: Self-pay

## 2022-12-13 ENCOUNTER — Other Ambulatory Visit (HOSPITAL_COMMUNITY): Payer: Self-pay

## 2022-12-13 DIAGNOSIS — R3914 Feeling of incomplete bladder emptying: Secondary | ICD-10-CM | POA: Diagnosis not present

## 2022-12-13 DIAGNOSIS — R972 Elevated prostate specific antigen [PSA]: Secondary | ICD-10-CM | POA: Diagnosis not present

## 2022-12-13 DIAGNOSIS — R351 Nocturia: Secondary | ICD-10-CM | POA: Diagnosis not present

## 2022-12-13 DIAGNOSIS — N401 Enlarged prostate with lower urinary tract symptoms: Secondary | ICD-10-CM | POA: Diagnosis not present

## 2022-12-13 MED ORDER — TAMSULOSIN HCL 0.4 MG PO CAPS
0.4000 mg | ORAL_CAPSULE | Freq: Every day | ORAL | 11 refills | Status: AC
  Filled 2022-12-13: qty 30, 30d supply, fill #0
  Filled 2023-01-09: qty 30, 30d supply, fill #1
  Filled 2023-01-24 – 2023-02-06 (×2): qty 30, 30d supply, fill #2
  Filled 2023-03-20 – 2023-04-27 (×2): qty 30, 30d supply, fill #3
  Filled 2023-06-23: qty 30, 30d supply, fill #4

## 2022-12-27 ENCOUNTER — Other Ambulatory Visit (HOSPITAL_COMMUNITY): Payer: Self-pay

## 2022-12-27 DIAGNOSIS — R3915 Urgency of urination: Secondary | ICD-10-CM | POA: Diagnosis not present

## 2022-12-27 DIAGNOSIS — R3914 Feeling of incomplete bladder emptying: Secondary | ICD-10-CM | POA: Diagnosis not present

## 2023-01-01 ENCOUNTER — Other Ambulatory Visit: Payer: Self-pay

## 2023-01-05 ENCOUNTER — Other Ambulatory Visit (HOSPITAL_COMMUNITY): Payer: Self-pay

## 2023-01-09 ENCOUNTER — Other Ambulatory Visit (HOSPITAL_COMMUNITY): Payer: Self-pay

## 2023-01-09 ENCOUNTER — Other Ambulatory Visit: Payer: Self-pay

## 2023-01-17 ENCOUNTER — Other Ambulatory Visit (HOSPITAL_COMMUNITY): Payer: Self-pay

## 2023-01-24 ENCOUNTER — Other Ambulatory Visit (HOSPITAL_COMMUNITY): Payer: Self-pay

## 2023-01-24 ENCOUNTER — Other Ambulatory Visit: Payer: Self-pay

## 2023-01-25 ENCOUNTER — Other Ambulatory Visit (HOSPITAL_COMMUNITY): Payer: Self-pay

## 2023-01-27 ENCOUNTER — Other Ambulatory Visit: Payer: Self-pay

## 2023-01-27 ENCOUNTER — Other Ambulatory Visit (HOSPITAL_COMMUNITY): Payer: Self-pay

## 2023-01-29 ENCOUNTER — Other Ambulatory Visit: Payer: Self-pay

## 2023-01-30 ENCOUNTER — Other Ambulatory Visit (HOSPITAL_COMMUNITY): Payer: Self-pay

## 2023-01-31 DIAGNOSIS — N401 Enlarged prostate with lower urinary tract symptoms: Secondary | ICD-10-CM | POA: Diagnosis not present

## 2023-01-31 DIAGNOSIS — D414 Neoplasm of uncertain behavior of bladder: Secondary | ICD-10-CM | POA: Diagnosis not present

## 2023-01-31 DIAGNOSIS — R351 Nocturia: Secondary | ICD-10-CM | POA: Diagnosis not present

## 2023-01-31 DIAGNOSIS — R3915 Urgency of urination: Secondary | ICD-10-CM | POA: Diagnosis not present

## 2023-02-06 ENCOUNTER — Other Ambulatory Visit (HOSPITAL_COMMUNITY): Payer: Self-pay

## 2023-02-06 ENCOUNTER — Other Ambulatory Visit: Payer: Self-pay

## 2023-02-11 ENCOUNTER — Other Ambulatory Visit: Payer: Self-pay | Admitting: Student

## 2023-02-11 DIAGNOSIS — E1140 Type 2 diabetes mellitus with diabetic neuropathy, unspecified: Secondary | ICD-10-CM

## 2023-02-11 DIAGNOSIS — E611 Iron deficiency: Secondary | ICD-10-CM

## 2023-02-12 ENCOUNTER — Other Ambulatory Visit (HOSPITAL_COMMUNITY): Payer: Self-pay

## 2023-02-12 MED ORDER — POLYSACCHARIDE IRON COMPLEX 150 MG PO CAPS
150.0000 mg | ORAL_CAPSULE | Freq: Every day | ORAL | 0 refills | Status: DC
  Filled 2023-02-12: qty 90, 90d supply, fill #0

## 2023-02-12 MED ORDER — MOUNJARO 5 MG/0.5ML ~~LOC~~ SOAJ
5.0000 mg | SUBCUTANEOUS | 0 refills | Status: DC
  Filled 2023-02-12: qty 6, 84d supply, fill #0

## 2023-02-13 ENCOUNTER — Other Ambulatory Visit: Payer: Self-pay | Admitting: Student

## 2023-02-13 ENCOUNTER — Other Ambulatory Visit (HOSPITAL_COMMUNITY): Payer: Self-pay

## 2023-02-13 DIAGNOSIS — G8929 Other chronic pain: Secondary | ICD-10-CM

## 2023-02-14 NOTE — Telephone Encounter (Signed)
Next appt scheduled 5/22 with PCP.

## 2023-02-15 ENCOUNTER — Other Ambulatory Visit: Payer: Self-pay | Admitting: Student

## 2023-02-15 ENCOUNTER — Other Ambulatory Visit (HOSPITAL_COMMUNITY): Payer: Self-pay

## 2023-02-15 DIAGNOSIS — G8929 Other chronic pain: Secondary | ICD-10-CM

## 2023-02-19 ENCOUNTER — Other Ambulatory Visit (HOSPITAL_COMMUNITY): Payer: Self-pay

## 2023-02-19 ENCOUNTER — Other Ambulatory Visit: Payer: Self-pay | Admitting: Student

## 2023-02-19 DIAGNOSIS — K219 Gastro-esophageal reflux disease without esophagitis: Secondary | ICD-10-CM

## 2023-02-19 MED ORDER — BACLOFEN 20 MG PO TABS
20.0000 mg | ORAL_TABLET | Freq: Three times a day (TID) | ORAL | 2 refills | Status: DC
  Filled 2023-02-19: qty 90, 30d supply, fill #0
  Filled 2023-03-22: qty 90, 30d supply, fill #1
  Filled 2023-04-27: qty 90, 30d supply, fill #2

## 2023-02-20 NOTE — Telephone Encounter (Signed)
Next appt scheduled tomorrow 5/22 with PCP.

## 2023-02-21 ENCOUNTER — Encounter: Payer: Self-pay | Admitting: Student

## 2023-02-21 ENCOUNTER — Ambulatory Visit (INDEPENDENT_AMBULATORY_CARE_PROVIDER_SITE_OTHER): Payer: Commercial Managed Care - PPO | Admitting: Student

## 2023-02-21 ENCOUNTER — Other Ambulatory Visit (HOSPITAL_COMMUNITY): Payer: Self-pay

## 2023-02-21 VITALS — BP 124/71 | HR 64 | Temp 98.1°F | Ht 74.0 in | Wt 178.2 lb

## 2023-02-21 DIAGNOSIS — E1151 Type 2 diabetes mellitus with diabetic peripheral angiopathy without gangrene: Secondary | ICD-10-CM | POA: Diagnosis not present

## 2023-02-21 DIAGNOSIS — E0840 Diabetes mellitus due to underlying condition with diabetic neuropathy, unspecified: Secondary | ICD-10-CM

## 2023-02-21 DIAGNOSIS — E1140 Type 2 diabetes mellitus with diabetic neuropathy, unspecified: Secondary | ICD-10-CM | POA: Diagnosis not present

## 2023-02-21 DIAGNOSIS — I10 Essential (primary) hypertension: Secondary | ICD-10-CM | POA: Diagnosis not present

## 2023-02-21 DIAGNOSIS — I83892 Varicose veins of left lower extremities with other complications: Secondary | ICD-10-CM | POA: Diagnosis not present

## 2023-02-21 DIAGNOSIS — I872 Venous insufficiency (chronic) (peripheral): Secondary | ICD-10-CM

## 2023-02-21 DIAGNOSIS — L818 Other specified disorders of pigmentation: Secondary | ICD-10-CM | POA: Diagnosis not present

## 2023-02-21 DIAGNOSIS — Z794 Long term (current) use of insulin: Secondary | ICD-10-CM

## 2023-02-21 LAB — GLUCOSE, CAPILLARY: Glucose-Capillary: 113 mg/dL — ABNORMAL HIGH (ref 70–99)

## 2023-02-21 LAB — POCT GLYCOSYLATED HEMOGLOBIN (HGB A1C): Hemoglobin A1C: 7.1 % — AB (ref 4.0–5.6)

## 2023-02-21 MED ORDER — INSULIN PEN NEEDLE 32G X 4 MM MISC
8 refills | Status: DC
Start: 2023-02-21 — End: 2024-04-04
  Filled 2023-02-21: qty 100, fill #0
  Filled 2023-07-22: qty 100, 17d supply, fill #0
  Filled 2023-07-26: qty 200, 30d supply, fill #0
  Filled 2023-08-01: qty 100, 17d supply, fill #0
  Filled 2023-08-16 – 2023-08-29 (×3): qty 100, 17d supply, fill #1
  Filled 2023-10-29 – 2023-12-17 (×6): qty 100, 17d supply, fill #2
  Filled 2024-01-10: qty 100, 17d supply, fill #3
  Filled 2024-01-11: qty 500, 83d supply, fill #3
  Filled 2024-01-11: qty 100, 17d supply, fill #3

## 2023-02-21 MED ORDER — TRIAMTERENE-HCTZ 75-50 MG PO TABS
0.5000 | ORAL_TABLET | Freq: Every day | ORAL | 3 refills | Status: DC
Start: 2023-02-21 — End: 2024-04-14
  Filled 2023-02-21 – 2023-05-07 (×2): qty 45, 90d supply, fill #0
  Filled 2023-07-18: qty 45, 90d supply, fill #1
  Filled 2023-10-18: qty 45, 90d supply, fill #2
  Filled 2024-01-10: qty 45, 90d supply, fill #3

## 2023-02-21 MED ORDER — LOSARTAN POTASSIUM 50 MG PO TABS
50.0000 mg | ORAL_TABLET | Freq: Every day | ORAL | 3 refills | Status: DC
Start: 2023-02-21 — End: 2024-03-27
  Filled 2023-02-21 – 2023-04-04 (×2): qty 90, 90d supply, fill #0
  Filled 2023-06-30: qty 90, 90d supply, fill #1
  Filled 2023-10-03: qty 90, 90d supply, fill #2
  Filled 2023-12-28: qty 90, 90d supply, fill #3

## 2023-02-21 MED ORDER — MOUNJARO 7.5 MG/0.5ML ~~LOC~~ SOAJ
7.5000 mg | SUBCUTANEOUS | 3 refills | Status: DC
Start: 2023-02-21 — End: 2023-09-03
  Filled 2023-02-21 – 2023-05-07 (×2): qty 2, 28d supply, fill #0
  Filled 2023-06-01: qty 2, 28d supply, fill #1
  Filled 2023-06-30: qty 2, 28d supply, fill #2
  Filled 2023-08-01: qty 2, 28d supply, fill #3
  Filled 2023-08-25: qty 2, 28d supply, fill #4

## 2023-02-21 MED ORDER — PANTOPRAZOLE SODIUM 40 MG PO TBEC
40.0000 mg | DELAYED_RELEASE_TABLET | Freq: Every day | ORAL | 3 refills | Status: DC
  Filled 2023-02-21: qty 90, 90d supply, fill #0
  Filled 2023-05-17: qty 90, 90d supply, fill #1
  Filled 2023-08-16: qty 90, 90d supply, fill #2
  Filled 2023-11-13: qty 90, 90d supply, fill #3

## 2023-02-21 MED ORDER — GABAPENTIN 800 MG PO TABS
800.0000 mg | ORAL_TABLET | Freq: Three times a day (TID) | ORAL | 3 refills | Status: DC
Start: 2023-02-21 — End: 2024-03-06
  Filled 2023-02-21 – 2023-04-04 (×2): qty 270, 90d supply, fill #0
  Filled 2023-06-25: qty 270, 90d supply, fill #1
  Filled 2023-09-18: qty 270, 90d supply, fill #2
  Filled 2023-12-17: qty 270, 90d supply, fill #3

## 2023-02-21 MED ORDER — VERAPAMIL HCL ER 240 MG PO TBCR
240.0000 mg | EXTENDED_RELEASE_TABLET | Freq: Every day | ORAL | 3 refills | Status: DC
Start: 2023-02-21 — End: 2024-02-06
  Filled 2023-02-21 – 2023-03-22 (×2): qty 90, 90d supply, fill #0
  Filled 2023-06-23: qty 90, 90d supply, fill #1
  Filled 2023-09-20: qty 90, 90d supply, fill #2
  Filled 2023-12-23: qty 90, 90d supply, fill #3

## 2023-02-21 MED ORDER — EMPAGLIFLOZIN 25 MG PO TABS
25.0000 mg | ORAL_TABLET | Freq: Every day | ORAL | 3 refills | Status: DC
  Filled 2023-02-21: qty 90, 90d supply, fill #0
  Filled 2023-05-08: qty 90, 90d supply, fill #1
  Filled 2023-08-10: qty 90, 90d supply, fill #2
  Filled 2023-11-12: qty 90, 90d supply, fill #3

## 2023-02-21 MED ORDER — ATORVASTATIN CALCIUM 40 MG PO TABS
40.0000 mg | ORAL_TABLET | Freq: Every day | ORAL | 3 refills | Status: DC
Start: 2023-02-21 — End: 2024-04-27
  Filled 2023-02-21 – 2023-05-01 (×2): qty 90, 90d supply, fill #0
  Filled 2023-08-01: qty 90, 90d supply, fill #1
  Filled 2023-10-18: qty 90, 90d supply, fill #2
  Filled 2024-01-30: qty 90, 90d supply, fill #3

## 2023-02-21 MED ORDER — HUMALOG KWIKPEN 200 UNIT/ML ~~LOC~~ SOPN
10.0000 [IU] | PEN_INJECTOR | Freq: Every day | SUBCUTANEOUS | 2 refills | Status: DC
Start: 2023-02-21 — End: 2023-03-14
  Filled 2023-02-21: qty 3, 20d supply, fill #0

## 2023-02-21 NOTE — Assessment & Plan Note (Signed)
Patient with improvement in A1c today from 7.3 from 7.1. However, upon further discussion, patient reported using Humalog sliding scale ~8-10 units pre-meals even with BG only in 130-140 range, which is not how it was prescribed. Despite this, Dexcom average mealtime glucose spikes ~300-400. While the A1c has decreased, this is worrisome and need better control.  Patient was instructed to use sliding scale as previously intended; expressed understanding. He was also encouraged to re-engage with Ms. [name] at [facility] office for diabetes education. In the meantime, patient's Jardiance was increased to 25 mg and Mounjaro to 7.5 mg weekly.  - Will monitor in 4 weeks and consider discontinuing Humalog/escalating DM management

## 2023-02-21 NOTE — Assessment & Plan Note (Signed)
Continues to be at goal. Denies chest pain, DOE, palpitations, changes in vision or presyncopal episodes Continue: - Triamterene-hydrochlorothiazide 37.5-25mg  daily - Verapamil 40mg  daily - Losartan 50mg  daily

## 2023-02-21 NOTE — Progress Notes (Signed)
Subjective:  CC: HTN, BP follow up  HPI:  Trevor Fischer is a 62 y.o. male with a past medical history stated below and presents today for Blood pressure and diabetes follow up. Please see problem based assessment and plan for additional details.  Past Medical History:  Diagnosis Date   Allergic rhinitis    Degenerative joint disease of knee    DVT (deep venous thrombosis) (HCC) RLE X 2   Ended anticoagulation in 2012   Flatulence 05/13/2015   GERD (gastroesophageal reflux disease)    History of DVT of lower extremity 10/08   HIV infection (HCC)    Hx MRSA infection    Hyperlipidemia    Hyperlipidemia 11/17/2021   Hypertension    Pulmonary embolism (HCC) 05/14/2017   Superficial thrombophlebitis    Type II diabetes mellitus (HCC) 9/09    Current Outpatient Medications on File Prior to Visit  Medication Sig Dispense Refill   Accu-Chek FastClix Lancets MISC USE TO CHECK BLOOD SUGAR UP TO 4 TIMES A DAY 102 each PRN   ACCU-CHEK GUIDE test strip USE TO CHECK BLOOD SUGAR UP TO 4 TIMES A DAY 400 strip PRN   acetaminophen (TYLENOL) 500 MG tablet Take 2 tablets (1,000 mg total) by mouth 2 (two) times daily. 30 tablet 3   Alcohol Swabs (EASY TOUCH ALCOHOL PREP MEDIUM) 70 % PADS Use as directed up to 10 daily 1000 each 3   apixaban (ELIQUIS) 5 MG TABS tablet Take 1 tablet (5 mg total) by mouth 2 (two) times daily. 180 tablet 2   baclofen (LIORESAL) 20 MG tablet Take 1 tablet (20 mg total) by mouth 3 (three) times daily. 90 tablet 2   bictegravir-emtricitabine-tenofovir AF (BIKTARVY) 50-200-25 MG TABS tablet TAKE 1 TABLET BY MOUTH DAILY. 30 tablet 11   Blood Glucose Monitoring Suppl (FREESTYLE LITE) DEVI      chlorpheniramine (CHLOR-TRIMETON) 4 MG tablet Take 1 tablet (4 mg total) by mouth every 4 (four) hours as needed for allergies. 150 tablet 1   Continuous Blood Gluc Receiver (DEXCOM G7 RECEIVER) DEVI Continuously monitor sugars. Please change every 10 days. 9 each 3    Continuous Glucose Sensor (DEXCOM G7 SENSOR) MISC Use as directed to check blood sugar every 10 days. 4 each 3   COVID-19 mRNA Vac-TriS, Pfizer, (PFIZER-BIONT COVID-19 VAC-TRIS) SUSP injection Inject into the muscle. 0.3 mL 0   COVID-19 mRNA vaccine 2023-2024 (COMIRNATY) syringe Inject into the muscle. 0.3 mL 0   FREESTYLE LITE test strip      iron polysaccharides (FERREX 150) 150 MG capsule Take 1 capsule (150 mg total) by mouth daily. 90 capsule 0   Lancets (FREESTYLE) lancets USE TO CHECK BLOOD SUGAR UP TO 4 TIMES A DAY 100 each 99   LORazepam (ATIVAN) 1 MG tablet Take 1 tablet 30 minutes prior to leaving house on day of office surgery.  Bring second tablet with you to office on day of office surgery. 2 tablet 0   LORazepam (ATIVAN) 1 MG tablet Take 1 tablet (1 mg total) by mouth 30 minutes prior to leaving house on day of surgery and bring 2nd tablet to office 2 tablet 0   Omega-3 Fatty Acids (FISH OIL) 1000 MG CAPS Take 1 capsule by mouth 2 (two) times daily.     pantoprazole (PROTONIX) 40 MG tablet Take 1 tablet (40 mg total) by mouth daily. 90 tablet 3   sildenafil (VIAGRA) 50 MG tablet Take 1 tablet (50 mg total) by mouth daily  as needed for erectile dysfunction. 30 tablet 0   Simethicone 180 MG CAPS Take 1 capsule (180 mg total) by mouth 3 (three) times daily as needed. 60 capsule 3   tamsulosin (FLOMAX) 0.4 MG CAPS capsule Take 1 capsule (0.4 mg total) by mouth daily. 30 capsule 11   Tdap (BOOSTRIX) 5-2.5-18.5 LF-MCG/0.5 injection Inject 0.5 mLs into the muscle. 0.5 mL 0   No current facility-administered medications on file prior to visit.    Family History  Problem Relation Age of Onset   Diabetes Father    Kidney disease Father    Heart failure Father    Hyperlipidemia Father    Hypertension Father    Osteoarthritis Mother     Social History   Socioeconomic History   Marital status: Married    Spouse name: Not on file   Number of children: Not on file   Years of  education: Not on file   Highest education level: Not on file  Occupational History   Not on file  Tobacco Use   Smoking status: Former    Packs/day: 1.00    Years: 28.00    Additional pack years: 0.00    Total pack years: 28.00    Types: Cigarettes    Quit date: 07/02/2005    Years since quitting: 17.6   Smokeless tobacco: Never  Vaping Use   Vaping Use: Never used  Substance and Sexual Activity   Alcohol use: Yes    Alcohol/week: 0.0 standard drinks of alcohol    Comment: 05/14/2017 "1-2 drinks/year"   Drug use: No   Sexual activity: Not Currently    Partners: Male    Comment: declined condoms  Other Topics Concern   Not on file  Social History Narrative   Lives in [place], with partner of 26 years    Works as Psychologist, sport and exercise at [employer]   Has [employer]   Social Determinants of Health   Financial Resource Strain: Not on file  Food Insecurity: No Food Insecurity (08/07/2022)   Hunger Vital Sign    Worried About Running Out of Food in the Last Year: Never true    Ran Out of Food in the Last Year: Never true  Transportation Needs: Not on file  Physical Activity: Not on file  Stress: Not on file  Social Connections: Not on file  Intimate Partner Violence: Not on file    Review of Systems: ROS negative except for what is noted on the assessment and plan.  Objective:   Vitals:   02/21/23 1418  BP: 124/71  Pulse: 64  Temp: 98.1 F (36.7 C)  TempSrc: Oral  SpO2: 99%  Weight: 178 lb 3.2 oz (80.8 kg)  Height: 6\' 2"  (1.88 m)    Physical Exam: Constitutional: well-appearing male sitting in chair, in no acute distress HENT: normocephalic atraumatic, mucous membranes moist Eyes: conjunctiva non-erythematous Neck: supple Cardiovascular: regular rate and rhythm, no m/r/g. Palpable bilateral pedal pulses Pulmonary/Chest: normal work of breathing on room air, lungs clear to auscultation bilaterally Abdominal: soft, non-tender, non-distended MSK: normal  bulk and tone Neurological: alert & oriented x 3, 5/5 strength in bilateral upper and lower extremities, normal gait Skin: warm and dry. Hyperpigmentation in blateral lower extremities with dilated varicose-like veins on inner thigh. No ulcers Psych: Pleasant mood and affect       02/21/2023    2:38 PM  Depression screen PHQ 2/9  Decreased Interest 0  Down, Depressed, Hopeless 0  PHQ - 2 Score  0  Altered sleeping 0  Tired, decreased energy 0  Change in appetite 0  Feeling bad or failure about yourself  0  Trouble concentrating 0  Moving slowly or fidgety/restless 0  Suicidal thoughts 0  PHQ-9 Score 0  Difficult doing work/chores Not difficult at all       02/21/2023    2:40 PM 03/03/2021    4:07 PM  GAD 7 : Generalized Anxiety Score  Nervous, Anxious, on Edge 0 0  Control/stop worrying 0 0  Worry too much - different things 0 0  Trouble relaxing 0 0  Restless 0 0  Easily annoyed or irritable 0 0  Afraid - awful might happen 0 0  Total GAD 7 Score 0 0  Anxiety Difficulty Not difficult at all Not difficult at all     Assessment & Plan:   Hypertension Continues to be at goal. Denies chest pain, DOE, palpitations, changes in vision or presyncopal episodes Continue: - Triamterene-hydrochlorothiazide 37.5-25mg  daily - Verapamil 40mg  daily - Losartan 50mg  daily  Diabetes mellitus with neuropathy [facility]) Patient with improvement in A1c today from 7.3 from 7.1. However, upon further discussion, patient reported using Humalog sliding scale ~8-10 units pre-meals even with BG only in 130-140 range, which is not how it was prescribed. Despite this, Dexcom average mealtime glucose spikes ~300-400. While the A1c has decreased, this is worrisome and need better control.  Patient was instructed to use sliding scale as previously intended; expressed understanding. He was also encouraged to re-engage with Ms. [name] at [facility] office for diabetes education. In the meantime, patient's Jardiance  was increased to 25 mg and Mounjaro to 7.5 mg weekly.  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 Jen Mow on 05/31/2022. With venous reflux in some of the L varicose veins. S/p laser ablation of the small saphenous veins and 1-20 stabs after failing conservative management with exercise, elevation, and compression stocking. Patient with no acute worsening of symptoms today. Denies pain. Patient is to see Dr. Jen Mow as needed    Return in about 3 weeks (around 03/14/2023) for Diabetes management, IDA and iron studies,.  Morene Crocker, MD [facility] Internal Medicine Program - PGY-1 02/21/2023

## 2023-02-21 NOTE — Patient Instructions (Signed)
Thank you, [name] for allowing [name] to provide your care today. Today we discussed HTN, diabetes, medication management.    New medications: - increase the dose of your jardiance and your mounjaro  I have ordered the following labs for you:   Lab Orders         Glucose, capillary         POC Hbg A1C      I will call if any are abnormal. All of your labs can be accessed through "My Chart".   My Chart Access: [email or web address]  Please follow-up in: 3 weeks, at the end of my time here in the clinic before I rotate away    We look forward to seeing you next time. Please call our clinic at [phone] if you have any questions or concerns. The best time to call is Monday-Friday from 9am-4pm, but there is someone available 24/7. If after hours or the weekend, call the main hospital number and ask for the Internal Medicine Resident On-Call. If you need medication refills, please notify your pharmacy one week in advance and they will send [name] a request.   Thank you for letting us take part in your care. Wishing you the best!  Trevor Crocker, MD 02/21/2023, 4:05 PM [facility] Internal Medicine Resident, PGY-1

## 2023-02-21 NOTE — Assessment & Plan Note (Signed)
Last seen by Dr. Jen Mow on 05/31/2022. With venous reflux in some of the L varicose veins. S/p laser ablation of the small saphenous veins and 1-20 stabs after failing conservative management with exercise, elevation, and compression stocking. Patient with no acute worsening of symptoms today. Denies pain. Patient is to see Dr. Jen Mow as needed

## 2023-02-22 ENCOUNTER — Other Ambulatory Visit (HOSPITAL_COMMUNITY): Payer: Self-pay

## 2023-02-23 NOTE — Progress Notes (Signed)
Internal Medicine Clinic Attending  Case discussed with Dr. Gomez-Caraballo  At the time of the visit.  We reviewed the resident's history and exam and pertinent patient test results.  I agree with the assessment, diagnosis, and plan of care documented in the resident's note.  

## 2023-02-23 NOTE — Addendum Note (Signed)
Addended by: Earl Lagos on: 02/23/2023 02:28 PM   Modules accepted: Level of Service

## 2023-02-24 ENCOUNTER — Other Ambulatory Visit (HOSPITAL_COMMUNITY): Payer: Self-pay

## 2023-02-27 ENCOUNTER — Other Ambulatory Visit (HOSPITAL_COMMUNITY): Payer: Self-pay

## 2023-03-02 ENCOUNTER — Other Ambulatory Visit (HOSPITAL_COMMUNITY): Payer: Self-pay

## 2023-03-02 DIAGNOSIS — D494 Neoplasm of unspecified behavior of bladder: Secondary | ICD-10-CM | POA: Diagnosis not present

## 2023-03-02 DIAGNOSIS — D414 Neoplasm of uncertain behavior of bladder: Secondary | ICD-10-CM | POA: Diagnosis not present

## 2023-03-02 DIAGNOSIS — N281 Cyst of kidney, acquired: Secondary | ICD-10-CM | POA: Diagnosis not present

## 2023-03-03 ENCOUNTER — Other Ambulatory Visit (HOSPITAL_COMMUNITY): Payer: Self-pay

## 2023-03-03 ENCOUNTER — Other Ambulatory Visit: Payer: Self-pay

## 2023-03-05 ENCOUNTER — Other Ambulatory Visit (HOSPITAL_COMMUNITY): Payer: Self-pay

## 2023-03-05 DIAGNOSIS — R351 Nocturia: Secondary | ICD-10-CM | POA: Diagnosis not present

## 2023-03-05 DIAGNOSIS — D414 Neoplasm of uncertain behavior of bladder: Secondary | ICD-10-CM | POA: Diagnosis not present

## 2023-03-05 DIAGNOSIS — N401 Enlarged prostate with lower urinary tract symptoms: Secondary | ICD-10-CM | POA: Diagnosis not present

## 2023-03-05 DIAGNOSIS — R3915 Urgency of urination: Secondary | ICD-10-CM | POA: Diagnosis not present

## 2023-03-05 MED ORDER — SILODOSIN 8 MG PO CAPS
8.0000 mg | ORAL_CAPSULE | Freq: Every day | ORAL | 11 refills | Status: DC
  Filled 2023-03-05: qty 30, 30d supply, fill #0
  Filled 2023-03-22 – 2023-04-04 (×2): qty 30, 30d supply, fill #1
  Filled 2023-05-01: qty 30, 30d supply, fill #2
  Filled 2023-06-01: qty 30, 30d supply, fill #3
  Filled 2023-06-25 – 2023-07-02 (×2): qty 30, 30d supply, fill #4
  Filled 2023-08-16: qty 30, 30d supply, fill #5
  Filled 2023-09-18: qty 30, 30d supply, fill #6
  Filled 2023-10-15: qty 30, 30d supply, fill #7
  Filled 2023-11-15: qty 30, 30d supply, fill #8
  Filled 2023-12-17: qty 30, 30d supply, fill #9
  Filled 2024-01-14: qty 30, 30d supply, fill #10
  Filled 2024-02-14: qty 30, 30d supply, fill #11

## 2023-03-07 ENCOUNTER — Other Ambulatory Visit (HOSPITAL_COMMUNITY): Payer: Self-pay

## 2023-03-13 ENCOUNTER — Other Ambulatory Visit (HOSPITAL_COMMUNITY): Payer: Self-pay

## 2023-03-14 ENCOUNTER — Ambulatory Visit (INDEPENDENT_AMBULATORY_CARE_PROVIDER_SITE_OTHER): Payer: Commercial Managed Care - PPO | Admitting: Dietician

## 2023-03-14 ENCOUNTER — Other Ambulatory Visit: Payer: Self-pay

## 2023-03-14 ENCOUNTER — Encounter: Payer: Self-pay | Admitting: Student

## 2023-03-14 ENCOUNTER — Ambulatory Visit (INDEPENDENT_AMBULATORY_CARE_PROVIDER_SITE_OTHER): Payer: Commercial Managed Care - PPO | Admitting: Student

## 2023-03-14 ENCOUNTER — Other Ambulatory Visit (HOSPITAL_COMMUNITY): Payer: Self-pay

## 2023-03-14 VITALS — BP 115/67 | HR 69 | Temp 98.3°F | Resp 24 | Ht 74.0 in | Wt 179.0 lb

## 2023-03-14 DIAGNOSIS — Z794 Long term (current) use of insulin: Secondary | ICD-10-CM | POA: Diagnosis not present

## 2023-03-14 DIAGNOSIS — E1140 Type 2 diabetes mellitus with diabetic neuropathy, unspecified: Secondary | ICD-10-CM | POA: Diagnosis not present

## 2023-03-14 DIAGNOSIS — Z7984 Long term (current) use of oral hypoglycemic drugs: Secondary | ICD-10-CM | POA: Diagnosis not present

## 2023-03-14 MED ORDER — LYUMJEV KWIKPEN 100 UNIT/ML ~~LOC~~ SOPN
10.0000 [IU] | PEN_INJECTOR | Freq: Three times a day (TID) | SUBCUTANEOUS | 2 refills | Status: DC
Start: 2023-03-14 — End: 2023-08-10
  Filled 2023-03-14: qty 12, 30d supply, fill #0
  Filled 2023-05-15: qty 12, 30d supply, fill #1

## 2023-03-14 NOTE — Patient Instructions (Signed)
To help lower after meal especially breakfast your plan is to:  1- try eating (LOWER CARBS) at breakfast like hard boiled egg or berries or Oikos yogurt instead of chocolate cake 2- change to Lyumjev as soon as possible.  3- let's follow up in about a month by email to see if the plan is making a difference.   [name] [phone]

## 2023-03-14 NOTE — Progress Notes (Signed)
Estimated body mass index is 22.98 kg/m as calculated from the following:   Height as of an earlier encounter on 03/14/23: 6\' 2"  (1.88 m).   Weight as of an earlier encounter on 03/14/23: 179 lb (81.2 kg). Wt Readings from Last 10 Encounters:  03/14/23 179 lb (81.2 kg)  02/21/23 178 lb 3.2 oz (80.8 kg)  11/29/22 185 lb (83.9 kg)  11/16/22 188 lb 6.4 oz (85.5 kg)  08/16/22 180 lb (81.6 kg)  08/09/22 182 lb 14.4 oz (83 kg)  08/07/22 187 lb 14.4 oz (85.2 kg)  05/31/22 190 lb 12.8 oz (86.5 kg)  04/11/22 197 lb (89.4 kg)  01/11/22 219 lb 6.4 oz (99.5 kg)   Lab Results  Component Value Date   HGBA1C 7.1 (A) 02/21/2023   HGBA1C 7.3 (A) 11/16/2022   HGBA1C 7.8 (A) 08/07/2022   HGBA1C 6.9 (A) 04/11/2022   HGBA1C 7.3 (A) 01/11/2022     Diabetes Self-Management Education  Visit Type: Annual Follow-Up  Appt. Start Time: 915 Appt. End Time: 945  03/14/2023  Trevor Fischer, identified by name and date of birth, is a 62 y.o. male with a diagnosis of Diabetes:  .   ASSESSMENT  Weight decreased ~40# in past year, representing 18% of his weight. BMI now in target range. Patient feels he has lost a great deal of muscle mass. He is drinking protein drinks 30 gram twice daily He states that he does not desire more weight loss.  Would hold on weight loss medications. Goal is lower A1c. Problem is post-prandial spikes and largest is after breakfast. A1c between 7-8% is consistent with this. Discussed what might be causing this. He thinks it is too much carbohydrate at that meal and timing of his insulin before the meal.  He agrees to try lyumjev instead of Humalog insulin.   Diabetes Self-Management Education - 03/14/23 1000       Visit Information   Visit Type Annual Follow-Up      Health Coping   How would you rate your overall health? Good      Pre-Education Assessment   Patient understands the diabetes disease and treatment process. Comprehends key points    Patient understands  incorporating nutritional management into lifestyle. Comprehends key points    Patient undertands incorporating physical activity into lifestyle. Comprehends key points    Patient understands using medications safely. Comprehends key points    Patient understands monitoring blood glucose, interpreting and using results Comprehends key points    Patient understands prevention, detection, and treatment of acute complications. Needs Review    Patient understands prevention, detection, and treatment of chronic complications. Compreheands key points    Patient understands how to develop strategies to address psychosocial issues. Comprehends key points    Patient understands how to develop strategies to promote health/change behavior. Needs Review      Complications   Last HgB A1C per patient/outside source 7.1 %    How often do you check your blood sugar? > 4 times/day    Fasting Blood glucose range (mg/dL) 16-109    Postprandial Blood glucose range (mg/dL) >604;540-981    Number of hypoglycemic episodes per month 0    Number of hyperglycemic episodes ( >200mg /dL): Daily    Can you tell when your blood sugar is high? Yes    What do you do if your blood sugar is high? moves   assess further at follow up in 1 month   Have you had a dilated eye exam in  the past 12 months? Yes    Have you had a dental exam in the past 12 months? Yes    Are you checking your feet? Yes    How many days per week are you checking your feet? 7      Dietary Intake   Breakfast choc cake, chocolate almond milk, pprotein shake, chicken biscuitlet      Activity / Exercise   Activity / Exercise Type Light (walking / raking leaves);ADL's    How many days per week do you exercise? 60    How many minutes per day do you exercise? 5    Total minutes per week of exercise 300      Patient Education   Previous Diabetes Education Yes (please comment)    Acute complications Discussed and identified patients' prevention,  symptoms, and treatment of hyperglycemia.    Lifestyle and Health Coping Helped patient develop diabetes management plan for (enter comment)   lowering post prandial glucose spikes     Individualized Goals (developed by patient)   Nutrition Follow meal plan discussed   see patient instructions for lowoer carb substitutions     Post-Education Assessment   Patient understands incorporating nutritional management into lifestyle. Comprehends key points    Patient understands prevention, detection, and treatment of acute complications. Comprehends key points      Outcomes   Expected Outcomes Demonstrated interest in learning. Expect positive outcomes    Future DMSE 4-6 wks    Program Status Completed      Subsequent Visit   Since your last visit have you continued or begun to take your medications as prescribed? Yes    Since your last visit have you had your blood pressure checked? Yes    Is your most recent blood pressure lower, unchanged, or higher since your last visit? Unchanged    Since your last visit have you experienced any weight changes? Loss    Weight Loss (lbs) 40    Since your last visit, are you checking your blood glucose at least once a day? Yes             Individualized Plan for Diabetes Self-Management Training:   Learning Objective:  Patient will have a greater understanding of diabetes self-management. Patient education plan is to attend individual and/or group sessions per assessed needs and concerns.   Plan:   Patient Instructions  To help lower after meal especially breakfast your plan is to:  1- try eating (LOWER CARBS) at breakfast like hard boiled egg or berries or Oikos yogurt instead of chocolate cake 2- change to Lyumjev as soon as possible.  3- let's follow up in about a month by email to see if the plan is making a difference.   [name] [phone]   Expected Outcomes:  Demonstrated interest in learning. Expect positive outcomes  Education  material provided: Diabetes Resources  If problems or questions, patient to contact team via:  Phone  Future DSME appointment: 4-6 wks Norm Parcel, RD 03/14/2023 11:08 AM.

## 2023-03-14 NOTE — Progress Notes (Signed)
Subjective:  CC: DM follow up  HPI:  Trevor Fischer is a 62 y.o. male with a past medical history stated below and presents today for DM follow up. Please see problem based assessment and plan for additional details.  Past Medical History:  Diagnosis Date   Allergic rhinitis    Degenerative joint disease of knee    DVT (deep venous thrombosis) (HCC) RLE X 2   Ended anticoagulation in 2012   Flatulence 05/13/2015   GERD (gastroesophageal reflux disease)    History of DVT of lower extremity 10/08   HIV infection (HCC)    Hx MRSA infection    Hyperlipidemia    Hyperlipidemia 11/17/2021   Hypertension    Pulmonary embolism (HCC) 05/14/2017   Superficial thrombophlebitis    Type II diabetes mellitus (HCC) 9/09    Current Outpatient Medications on File Prior to Visit  Medication Sig Dispense Refill   Accu-Chek FastClix Lancets MISC USE TO CHECK BLOOD SUGAR UP TO 4 TIMES A DAY 102 each PRN   ACCU-CHEK GUIDE test strip USE TO CHECK BLOOD SUGAR UP TO 4 TIMES A DAY 400 strip PRN   acetaminophen (TYLENOL) 500 MG tablet Take 2 tablets (1,000 mg total) by mouth 2 (two) times daily. 30 tablet 3   Alcohol Swabs (EASY TOUCH ALCOHOL PREP MEDIUM) 70 % PADS Use as directed up to 10 daily 1000 each 3   apixaban (ELIQUIS) 5 MG TABS tablet Take 1 tablet (5 mg total) by mouth 2 (two) times daily. 180 tablet 2   atorvastatin (LIPITOR) 40 MG tablet Take 1 tablet (40 mg total) by mouth daily. 90 tablet 3   baclofen (LIORESAL) 20 MG tablet Take 1 tablet (20 mg total) by mouth 3 (three) times daily. 90 tablet 2   bictegravir-emtricitabine-tenofovir AF (BIKTARVY) 50-200-25 MG TABS tablet TAKE 1 TABLET BY MOUTH DAILY. 30 tablet 11   Blood Glucose Monitoring Suppl (FREESTYLE LITE) DEVI      chlorpheniramine (CHLOR-TRIMETON) 4 MG tablet Take 1 tablet (4 mg total) by mouth every 4 (four) hours as needed for allergies. 150 tablet 1   Continuous Blood Gluc Receiver (DEXCOM G7 RECEIVER) DEVI  Continuously monitor sugars. Please change every 10 days. 9 each 3   Continuous Glucose Sensor (DEXCOM G7 SENSOR) MISC Use as directed to check blood sugar every 10 days. 4 each 3   COVID-19 mRNA Vac-TriS, Pfizer, (PFIZER-BIONT COVID-19 VAC-TRIS) SUSP injection Inject into the muscle. 0.3 mL 0   COVID-19 mRNA vaccine 2023-2024 (COMIRNATY) syringe Inject into the muscle. 0.3 mL 0   empagliflozin (JARDIANCE) 25 MG TABS tablet Take 1 tablet (25 mg total) by mouth daily before breakfast. 90 tablet 3   FREESTYLE LITE test strip      gabapentin (NEURONTIN) 800 MG tablet Take 1 tablet (800 mg total) by mouth 3 (three) times daily. 270 tablet 3   Insulin Pen Needle 32G X 4 MM MISC Use as directed 6 times daily 100 each 8   iron polysaccharides (FERREX 150) 150 MG capsule Take 1 capsule (150 mg total) by mouth daily. 90 capsule 0   Lancets (FREESTYLE) lancets USE TO CHECK BLOOD SUGAR UP TO 4 TIMES A DAY 100 each 99   LORazepam (ATIVAN) 1 MG tablet Take 1 tablet 30 minutes prior to leaving house on day of office surgery.  Bring second tablet with you to office on day of office surgery. 2 tablet 0   LORazepam (ATIVAN) 1 MG tablet Take 1 tablet (1 mg  total) by mouth 30 minutes prior to leaving house on day of surgery and bring 2nd tablet to office 2 tablet 0   losartan (COZAAR) 50 MG tablet Take 1 tablet (50 mg total) by mouth daily. 90 tablet 3   Omega-3 Fatty Acids (FISH OIL) 1000 MG CAPS Take 1 capsule by mouth 2 (two) times daily.     pantoprazole (PROTONIX) 40 MG tablet Take 1 tablet (40 mg total) by mouth daily. 90 tablet 3   sildenafil (VIAGRA) 50 MG tablet Take 1 tablet (50 mg total) by mouth daily as needed for erectile dysfunction. 30 tablet 0   silodosin (RAPAFLO) 8 MG CAPS capsule Take 1 capsule (8 mg total) by mouth daily. 30 capsule 11   Simethicone 180 MG CAPS Take 1 capsule (180 mg total) by mouth 3 (three) times daily as needed. 60 capsule 3   tamsulosin (FLOMAX) 0.4 MG CAPS capsule Take 1  capsule (0.4 mg total) by mouth daily. 30 capsule 11   Tdap (BOOSTRIX) 5-2.5-18.5 LF-MCG/0.5 injection Inject 0.5 mLs into the muscle. 0.5 mL 0   tirzepatide (MOUNJARO) 7.5 MG/0.5ML Pen Inject 7.5 mg into the skin once a week. 6 mL 3   triamterene-hydrochlorothiazide (MAXZIDE) 75-50 MG tablet Take 1/2 tablet by mouth daily. 45 tablet 3   verapamil (CALAN-SR) 240 MG CR tablet Take 1 tablet (240 mg total) by mouth daily. 90 tablet 3   No current facility-administered medications on file prior to visit.    Family History  Problem Relation Age of Onset   Diabetes Father    Kidney disease Father    Heart failure Father    Hyperlipidemia Father    Hypertension Father    Osteoarthritis Mother     Social History   Socioeconomic History   Marital status: Married    Spouse name: Not on file   Number of children: Not on file   Years of education: Not on file   Highest education level: Not on file  Occupational History   Not on file  Tobacco Use   Smoking status: Former    Packs/day: 1.00    Years: 28.00    Additional pack years: 0.00    Total pack years: 28.00    Types: Cigarettes    Quit date: 07/02/2005    Years since quitting: 17.7   Smokeless tobacco: Never  Vaping Use   Vaping Use: Never used  Substance and Sexual Activity   Alcohol use: Yes    Alcohol/week: 0.0 standard drinks of alcohol    Comment: 05/14/2017 "1-2 drinks/year"   Drug use: No   Sexual activity: Not Currently    Partners: Male    Comment: declined condoms  Other Topics Concern   Not on file  Social History Narrative   Lives in [place], with partner of 26 years    Works as Psychologist, sport and exercise at [employer]   Has [employer]   Social Determinants of Health   Financial Resource Strain: Not on file  Food Insecurity: No Food Insecurity (08/07/2022)   Hunger Vital Sign    Worried About Running Out of Food in the Last Year: Never true    Ran Out of Food in the Last Year: Never true  Transportation  Needs: Not on file  Physical Activity: Not on file  Stress: Not on file  Social Connections: Not on file  Intimate Partner Violence: Not on file    Review of Systems: ROS negative except for what is noted on the assessment  and plan.  Objective:   Vitals:   03/14/23 0847  BP: 115/67  Pulse: 69  Resp: (!) 24  Temp: 98.3 F (36.8 C)  TempSrc: Oral  SpO2: 100%  Weight: 179 lb (81.2 kg)  Height: 6\' 2"  (1.88 m)    Physical Exam: Constitutional: well-appearing man sitting in exam chair, in no acute distress HENT: normocephalic atraumatic, mucous membranes moist Eyes: conjunctiva non-erythematous Neck: supple Cardiovascular: regular rate and rhythm, no m/r/g Pulmonary/Chest: normal work of breathing on room air, lungs clear to auscultation bilaterally Abdominal: soft, non-tender, non-distended MSK: normal bulk and tone Neurological: alert & oriented x 3, 5/5 strength in bilateral upper and lower extremities, normal gait Skin: warm and dry Psych: Pleasant mood and affect       03/14/2023    8:45 AM  Depression screen PHQ 2/9  Decreased Interest 0  Down, Depressed, Hopeless 0  PHQ - 2 Score 0  Altered sleeping 0  Tired, decreased energy 0  Change in appetite 0  Feeling bad or failure about yourself  0  Trouble concentrating 0  Moving slowly or fidgety/restless 0  Suicidal thoughts 0  PHQ-9 Score 0  Difficult doing work/chores Not difficult at all       02/21/2023    2:40 PM 03/03/2021    4:07 PM  GAD 7 : Generalized Anxiety Score  Nervous, Anxious, on Edge 0 0  Control/stop worrying 0 0  Worry too much - different things 0 0  Trouble relaxing 0 0  Restless 0 0  Easily annoyed or irritable 0 0  Afraid - awful might happen 0 0  Total GAD 7 Score 0 0  Anxiety Difficulty Not difficult at all Not difficult at all     Assessment & Plan:   Diabetes mellitus with neuropathy [facility] Patient returns for DM follow up. He was not able to retrieve Mounjaro 7.5 mg from  pharmacy as he had a 3 month supply from the lower dose he had before. He has been able to take Jardiance 25 mg daily, and has adjusted his SSI Humalog to the correct dosage. Patient will not be able to pick up Mounjaro 7.5 mg until the end of July.  Discussed meal time insulin use and patient reports he does not usually have time to wait 30 min before eating.   Today, analysis of his cGM revealed patient is 52% in range with 36% in the high range and 12% in very high range. No lows noted. His average glucose is 180 with highest elevations around meal time.   Discussed patient's case with Norm Parcel and because of his patterns, patient would benefit from ultra fast acting insulin. Will trial Lispro-aabc today for mealtime coverage and continue on Jardiance. Patient will then switch to Mounjaro 7.5 mg weekly at the end of July. - Foot exam today - Changed to ultra fast Lispro-aabc, which confirmed to be 35$ monthly and use with same SSI as previous Lispro - Continue Jardiance 25 mg daily - Continue Mounjaro at current dose and switch to the already prescribed dose of 7.5 mg weekly - Return in 2 months for A1c check and follow up     No follow-ups on file.  Patient discussed with Dr. Gardiner Ramus, MD [facility] Internal Medicine Program - PGY-1 03/14/2023, 1:19 PM

## 2023-03-14 NOTE — Patient Instructions (Addendum)
Thank you, [name] for allowing [name] to provide your care today. Today we discussed your diabetes  - Please switch to higher dose of Mounjaro as soon as your current suply runs out - Keep Jardiance 25 mg - WE are changing your Humalog to a faster acting meal time insulin. I have sent Lispro-Aabc to the pharmacy, you should use it like you do Humalog but you don't to wait to eat. If too expensive, we will try another name brand name.   I will call if any are abnormal. All of your labs can be accessed through "My Chart".   My Chart Access: [email or web address]  Please follow-up in: ~2 months - please call at the beginning of July to schedule an appointment with me when I am next in Clinic ~at the end of August    We look forward to seeing you next time. Please call our clinic at [phone] if you have any questions or concerns. The best time to call is Monday-Friday from 9am-4pm, but there is someone available 24/7. If after hours or the weekend, call the main hospital number and ask for the Internal Medicine Resident On-Call. If you need medication refills, please notify your pharmacy one week in advance and they will send [name] a request.   Thank you for letting us take part in your care. Wishing you the best!  Morene Crocker, MD 03/14/2023, 9:22 AM [facility] Internal Medicine Resident, PGY-1

## 2023-03-14 NOTE — Assessment & Plan Note (Signed)
Patient returns for DM follow up. He was not able to retrieve Mounjaro 7.5 mg from pharmacy as he had a 3 month supply from the lower dose he had before. He has been able to take Jardiance 25 mg daily, and has adjusted his SSI Humalog to the correct dosage. Patient will not be able to pick up Mounjaro 7.5 mg until the end of July.  Discussed meal time insulin use and patient reports he does not usually have time to wait 30 min before eating.   Today, analysis of his cGM revealed patient is 52% in range with 36% in the high range and 12% in very high range. No lows noted. His average glucose is 180 with highest elevations around meal time.   Discussed patient's case with Norm Parcel and because of his patterns, patient would benefit from ultra fast acting insulin. Will trial Lispro-aabc today for mealtime coverage and continue on Jardiance. Patient will then switch to Mounjaro 7.5 mg weekly at the end of July. - Foot exam today - Changed to ultra fast Lispro-aabc, which confirmed to be 35$ monthly and use with same SSI as previous Lispro - Continue Jardiance 25 mg daily - Continue Mounjaro at current dose and switch to the already prescribed dose of 7.5 mg weekly - Return in 2 months for A1c check and follow up

## 2023-03-16 NOTE — Progress Notes (Signed)
Internal Medicine Clinic Attending  Case discussed with Dr. Gomez-Caraballo  At the time of the visit.  We reviewed the resident's history and exam and pertinent patient test results.  I agree with the assessment, diagnosis, and plan of care documented in the resident's note.  

## 2023-03-19 ENCOUNTER — Other Ambulatory Visit: Payer: Self-pay

## 2023-03-20 ENCOUNTER — Other Ambulatory Visit (HOSPITAL_COMMUNITY): Payer: Self-pay

## 2023-03-21 ENCOUNTER — Other Ambulatory Visit (HOSPITAL_COMMUNITY): Payer: Self-pay

## 2023-03-22 ENCOUNTER — Other Ambulatory Visit (HOSPITAL_COMMUNITY): Payer: Self-pay

## 2023-04-04 ENCOUNTER — Other Ambulatory Visit (HOSPITAL_COMMUNITY): Payer: Self-pay

## 2023-04-06 ENCOUNTER — Other Ambulatory Visit (HOSPITAL_COMMUNITY): Payer: Self-pay

## 2023-04-12 ENCOUNTER — Other Ambulatory Visit (HOSPITAL_COMMUNITY): Payer: Self-pay

## 2023-04-13 ENCOUNTER — Other Ambulatory Visit (HOSPITAL_COMMUNITY): Payer: Self-pay

## 2023-04-16 NOTE — Telephone Encounter (Signed)
Hi all, I am following up to see if it's okay for Mr, Trevor Fischer to increase his prandial insulin 1-2 units before his larger meals. I am happy to share his remote downloads. Thank you!  [name]

## 2023-04-17 NOTE — Telephone Encounter (Signed)
Yes. Thanks. I'll get with you tomorrow.

## 2023-04-18 NOTE — Telephone Encounter (Signed)
I am trying to copy you but not sure how to do that. Hope this works!Thank you!

## 2023-04-20 ENCOUNTER — Other Ambulatory Visit (HOSPITAL_BASED_OUTPATIENT_CLINIC_OR_DEPARTMENT_OTHER): Payer: Self-pay

## 2023-04-27 ENCOUNTER — Other Ambulatory Visit (HOSPITAL_COMMUNITY): Payer: Self-pay

## 2023-05-01 ENCOUNTER — Other Ambulatory Visit (HOSPITAL_COMMUNITY): Payer: Self-pay

## 2023-05-07 ENCOUNTER — Other Ambulatory Visit: Payer: Self-pay

## 2023-05-07 ENCOUNTER — Other Ambulatory Visit (HOSPITAL_COMMUNITY): Payer: Self-pay

## 2023-05-09 ENCOUNTER — Other Ambulatory Visit (HOSPITAL_COMMUNITY): Payer: Self-pay

## 2023-05-11 ENCOUNTER — Other Ambulatory Visit (HOSPITAL_COMMUNITY): Payer: Self-pay

## 2023-05-14 ENCOUNTER — Other Ambulatory Visit (HOSPITAL_COMMUNITY): Payer: Self-pay

## 2023-05-14 ENCOUNTER — Other Ambulatory Visit: Payer: Self-pay

## 2023-05-14 ENCOUNTER — Encounter: Payer: Self-pay | Admitting: Student

## 2023-05-14 ENCOUNTER — Ambulatory Visit: Payer: Commercial Managed Care - PPO | Admitting: Student

## 2023-05-14 VITALS — BP 126/73 | HR 62 | Temp 97.9°F | Resp 15 | Ht 74.0 in | Wt 176.8 lb

## 2023-05-14 DIAGNOSIS — E611 Iron deficiency: Secondary | ICD-10-CM

## 2023-05-14 DIAGNOSIS — Z83719 Family history of colon polyps, unspecified: Secondary | ICD-10-CM | POA: Insufficient documentation

## 2023-05-14 DIAGNOSIS — Z7984 Long term (current) use of oral hypoglycemic drugs: Secondary | ICD-10-CM | POA: Diagnosis not present

## 2023-05-14 DIAGNOSIS — E1140 Type 2 diabetes mellitus with diabetic neuropathy, unspecified: Secondary | ICD-10-CM | POA: Diagnosis not present

## 2023-05-14 DIAGNOSIS — Z794 Long term (current) use of insulin: Secondary | ICD-10-CM | POA: Diagnosis not present

## 2023-05-14 MED ORDER — POLYSACCHARIDE IRON COMPLEX 150 MG PO CAPS
150.0000 mg | ORAL_CAPSULE | Freq: Every day | ORAL | 0 refills | Status: DC
Start: 2023-05-14 — End: 2023-09-03
  Filled 2023-05-14: qty 90, 90d supply, fill #0

## 2023-05-14 NOTE — Patient Instructions (Addendum)
Thank you, [name] for allowing [name] to provide your care today. Today we discussed your diabetes  Right now you are adding 2 extra units to all your mealtime insulin sliding scale Moving forward, I want you to add an extra 2 units to the meal you have at night time. -- remember to do the insulin 15 min before eating Monitor your weight - increase lean proteins  Otherwise, continue your other medications as you had   I have ordered the following labs for you:   Lab Orders         Microalbumin / Creatinine Urine Ratio      I will call if any are abnormal. All of your labs can be accessed through "My Chart".   My Chart Access: [email or web address] Please follow-up in: 1 month for diabetes A1c and blood work for kidney function AND your iron deficiency    We look forward to seeing you next time. Please call our clinic at [phone] if you have any questions or concerns. The best time to call is Monday-Friday from 9am-4pm, but there is someone available 24/7. If after hours or the weekend, call the main hospital number and ask for the Internal Medicine Resident On-Call. If you need medication refills, please notify your pharmacy one week in advance and they will send [name] a request.   Thank you for letting us take part in your care. Wishing you the best!  Morene Crocker, MD 05/14/2023, 10:45 AM [facility] Internal Medicine Resident, PGY-2

## 2023-05-14 NOTE — Progress Notes (Signed)
Subjective:  CC: Diabetes follow up  HPI:  Trevor Fischer is a 62 y.o. male with a past medical history stated below and presents today for diabetes follow up. Please see problem based assessment and plan for additional details.  Past Medical History:  Diagnosis Date   Allergic rhinitis    Degenerative joint disease of knee    DVT (deep venous thrombosis) (HCC) RLE X 2   Ended anticoagulation in 2012   Flatulence 05/13/2015   GERD (gastroesophageal reflux disease)    History of DVT of lower extremity 10/08   HIV infection (HCC)    Hx MRSA infection    Hyperlipidemia    Hyperlipidemia 11/17/2021   Hypertension    Pulmonary embolism (HCC) 05/14/2017   Superficial thrombophlebitis    Type II diabetes mellitus (HCC) 9/09    Current Outpatient Medications on File Prior to Visit  Medication Sig Dispense Refill   Accu-Chek FastClix Lancets MISC USE TO CHECK BLOOD SUGAR UP TO 4 TIMES A DAY 102 each PRN   ACCU-CHEK GUIDE test strip USE TO CHECK BLOOD SUGAR UP TO 4 TIMES A DAY 400 strip PRN   acetaminophen (TYLENOL) 500 MG tablet Take 2 tablets (1,000 mg total) by mouth 2 (two) times daily. 30 tablet 3   Alcohol Swabs (EASY TOUCH ALCOHOL PREP MEDIUM) 70 % PADS Use as directed up to 10 daily 1000 each 3   apixaban (ELIQUIS) 5 MG TABS tablet Take 1 tablet (5 mg total) by mouth 2 (two) times daily. 180 tablet 2   atorvastatin (LIPITOR) 40 MG tablet Take 1 tablet (40 mg total) by mouth daily. 90 tablet 3   baclofen (LIORESAL) 20 MG tablet Take 1 tablet (20 mg total) by mouth 3 (three) times daily. 90 tablet 2   bictegravir-emtricitabine-tenofovir AF (BIKTARVY) 50-200-25 MG TABS tablet TAKE 1 TABLET BY MOUTH DAILY. 30 tablet 11   Blood Glucose Monitoring Suppl (FREESTYLE LITE) DEVI      chlorpheniramine (CHLOR-TRIMETON) 4 MG tablet Take 1 tablet (4 mg total) by mouth every 4 (four) hours as needed for allergies. 150 tablet 1   Continuous Blood Gluc Receiver (DEXCOM G7 RECEIVER)  DEVI Continuously monitor sugars. Please change every 10 days. 9 each 3   Continuous Glucose Sensor (DEXCOM G7 SENSOR) MISC Use as directed to check blood sugar every 10 days. 4 each 3   COVID-19 mRNA Vac-TriS, Pfizer, (PFIZER-BIONT COVID-19 VAC-TRIS) SUSP injection Inject into the muscle. 0.3 mL 0   COVID-19 mRNA vaccine 2023-2024 (COMIRNATY) syringe Inject into the muscle. 0.3 mL 0   empagliflozin (JARDIANCE) 25 MG TABS tablet Take 1 tablet (25 mg total) by mouth daily before breakfast. 90 tablet 3   FREESTYLE LITE test strip      gabapentin (NEURONTIN) 800 MG tablet Take 1 tablet (800 mg total) by mouth 3 (three) times daily. 270 tablet 3   Insulin Lispro-aabc (LYUMJEV KWIKPEN) 100 UNIT/ML KwikPen Inject 10 Units into the skin 4 (four) times daily -  with meals and at bedtime. 12 mL 2   Insulin Pen Needle 32G X 4 MM MISC Use as directed 6 times daily 100 each 8   Lancets (FREESTYLE) lancets USE TO CHECK BLOOD SUGAR UP TO 4 TIMES A DAY 100 each 99   LORazepam (ATIVAN) 1 MG tablet Take 1 tablet 30 minutes prior to leaving house on day of office surgery.  Bring second tablet with you to office on day of office surgery. 2 tablet 0   LORazepam (ATIVAN)  1 MG tablet Take 1 tablet (1 mg total) by mouth 30 minutes prior to leaving house on day of surgery and bring 2nd tablet to office 2 tablet 0   losartan (COZAAR) 50 MG tablet Take 1 tablet (50 mg total) by mouth daily. 90 tablet 3   Omega-3 Fatty Acids (FISH OIL) 1000 MG CAPS Take 1 capsule by mouth 2 (two) times daily.     pantoprazole (PROTONIX) 40 MG tablet Take 1 tablet (40 mg total) by mouth daily. 90 tablet 3   sildenafil (VIAGRA) 50 MG tablet Take 1 tablet (50 mg total) by mouth daily as needed for erectile dysfunction. 30 tablet 0   silodosin (RAPAFLO) 8 MG CAPS capsule Take 1 capsule (8 mg total) by mouth daily. 30 capsule 11   Simethicone 180 MG CAPS Take 1 capsule (180 mg total) by mouth 3 (three) times daily as needed. 60 capsule 3    tamsulosin (FLOMAX) 0.4 MG CAPS capsule Take 1 capsule (0.4 mg total) by mouth daily. 30 capsule 11   Tdap (BOOSTRIX) 5-2.5-18.5 LF-MCG/0.5 injection Inject 0.5 mLs into the muscle. 0.5 mL 0   tirzepatide (MOUNJARO) 7.5 MG/0.5ML Pen Inject 7.5 mg into the skin once a week. 6 mL 3   triamterene-hydrochlorothiazide (MAXZIDE) 75-50 MG tablet Take 1/2 tablet by mouth daily. 45 tablet 3   verapamil (CALAN-SR) 240 MG CR tablet Take 1 tablet (240 mg total) by mouth daily. 90 tablet 3   No current facility-administered medications on file prior to visit.    Family History  Problem Relation Age of Onset   Diabetes Father    Kidney disease Father    Heart failure Father    Hyperlipidemia Father    Hypertension Father    Osteoarthritis Mother     Social History   Socioeconomic History   Marital status: Married    Spouse name: Not on file   Number of children: Not on file   Years of education: Not on file   Highest education level: Not on file  Occupational History   Not on file  Tobacco Use   Smoking status: Former    Current packs/day: 0.00    Average packs/day: 1 pack/day for 28.0 years (28.0 ttl pk-yrs)    Types: Cigarettes    Start date: 07/02/1977    Quit date: 07/02/2005    Years since quitting: 17.8   Smokeless tobacco: Never  Vaping Use   Vaping status: Never Used  Substance and Sexual Activity   Alcohol use: Yes    Alcohol/week: 0.0 standard drinks of alcohol    Comment: 05/14/2017 "1-2 drinks/year"   Drug use: No   Sexual activity: Not Currently    Partners: Male    Comment: declined condoms  Other Topics Concern   Not on file  Social History Narrative   Lives in [place], with partner of 26 years    Works as Psychologist, sport and exercise at [employer]   Has [employer]   Social Determinants of Health   Financial Resource Strain: Not on file  Food Insecurity: No Food Insecurity (08/07/2022)   Hunger Vital Sign    Worried About Running Out of Food in the Last Year:  Never true    Ran Out of Food in the Last Year: Never true  Transportation Needs: No Transportation Needs (05/14/2023)   PRAPARE - Administrator, Civil Service (Medical): No    Lack of Transportation (Non-Medical): No  Physical Activity: Not on file  Stress: Not  on file  Social Connections: Not on file  Intimate Partner Violence: Not At Risk (05/14/2023)   Humiliation, Afraid, Rape, and Kick questionnaire    Fear of Current or Ex-Partner: No    Emotionally Abused: No    Physically Abused: No    Sexually Abused: No    Review of Systems: ROS negative except for what is noted on the assessment and plan.  Objective:   Vitals:   05/14/23 1012  BP: 126/73  Pulse: 62  Resp: 15  Temp: 97.9 F (36.6 C)  TempSrc: Oral  SpO2: 100%  Weight: 176 lb 12.8 oz (80.2 kg)  Height: 6\' 2"  (1.88 m)    Physical Exam: Constitutional: well-appearing man sitting in chair, in no acute distress HENT: normocephalic atraumatic, mucous membranes moist Eyes: conjunctiva non-erythematous Neck: supple, no LAD Cardiovascular: regular rate and rhythm, no m/r/g Pulmonary/Chest: normal work of breathing on room air, lungs clear to auscultation bilaterally Abdominal: soft, non-tender, non-distended MSK: normal bulk and tone Neurological: alert & oriented x 3 Skin: warm and dry Psych: Pleasant mood and affect       05/14/2023   10:20 AM  Depression screen PHQ 2/9  Decreased Interest 0  Down, Depressed, Hopeless 0  PHQ - 2 Score 0     Assessment & Plan:   Diabetes mellitus with neuropathy (HCC) Last A1c:  Lab Results  Component Value Date   HGBA1C 7.1 (A) 02/21/2023   Since last visit, patient increased SSI with ultra fast-acting insulin Lispro-Aabc 2 additional units with all his meals. He reports adherence but also difficult adhering to a diet schedule as he works nights. As such, he continues to snack throughout the night. Patient's Dexcom G7 was accessed during this visit. Only  37% in range, with the rest of his reading in the High or very High range. No lows or very lows. The highes BG peaks occure around 7-9 PM, which is when he eats "breakfast".  Discussed lifestyle modifications as well as increasing the dose of Lispro-Aabc 2 additional units for his meal at 7-9PM while at work. His dose of Mounjaro just recently increased to 7.5 mg weekly last week. He will need to monitor his weight as he is now in the normal range.  - Continue ultra fast Lispro-aabc SSI; he will need to call and report the current sliding scale; for now told patient to increase the 7-9PM dose by 2 units on that scale - Continue Jardiance 25 mg daily - Monitor weight trend - Continue Mounjaro at current dose and switch to the already prescribed dose of 7.5 mg weekly -Urine ACR today   Return in about 4 weeks (around 06/11/2023) for 1 month for diabetes A1c and blood work for kidney function AND your iron deficiency.  Patient discussed with Dr. Alfonse Alpers, MD [facility] Internal Medicine Program - PGY-2 05/14/2023, 12:44 PM

## 2023-05-14 NOTE — Assessment & Plan Note (Addendum)
Last A1c:  Lab Results  Component Value Date   HGBA1C 7.1 (A) 02/21/2023   Since last visit, patient increased SSI with ultra fast-acting insulin Lispro-Aabc 2 additional units with all his meals. He reports adherence but also difficult adhering to a diet schedule as he works nights. As such, he continues to snack throughout the night. Patient's Dexcom G7 was accessed during this visit. Only 37% in range, with the rest of his reading in the High or very High range. No lows or very lows. The highes BG peaks occure around 7-9 PM, which is when he eats "breakfast".  Discussed lifestyle modifications as well as increasing the dose of Lispro-Aabc 2 additional units for his meal at 7-9PM while at work. His dose of Mounjaro just recently increased to 7.5 mg weekly last week. He will need to monitor his weight as he is now in the normal range.  - Continue ultra fast Lispro-aabc SSI; he will need to call and report the current sliding scale; for now told patient to increase the 7-9PM dose by 2 units on that scale - Continue Jardiance 25 mg daily - Monitor weight trend - Continue Mounjaro at current dose and switch to the already prescribed dose of 7.5 mg weekly -Urine ACR today

## 2023-05-17 NOTE — Progress Notes (Signed)
Within normal levels. Continue follow up in 1 year.

## 2023-05-22 ENCOUNTER — Other Ambulatory Visit (HOSPITAL_COMMUNITY): Payer: Self-pay

## 2023-05-22 NOTE — Progress Notes (Signed)
 Internal Medicine Clinic Attending  Case discussed with the resident at the time of the visit.  We reviewed the resident's history and exam and pertinent patient test results.  I agree with the assessment, diagnosis, and plan of care documented in the resident's note.  Debe Coder, MD

## 2023-05-28 ENCOUNTER — Other Ambulatory Visit (HOSPITAL_COMMUNITY): Payer: Self-pay

## 2023-06-07 ENCOUNTER — Other Ambulatory Visit (HOSPITAL_COMMUNITY): Payer: Self-pay

## 2023-06-11 ENCOUNTER — Other Ambulatory Visit (HOSPITAL_COMMUNITY): Payer: Self-pay

## 2023-06-18 ENCOUNTER — Ambulatory Visit (INDEPENDENT_AMBULATORY_CARE_PROVIDER_SITE_OTHER): Payer: Commercial Managed Care - PPO | Admitting: Internal Medicine

## 2023-06-18 ENCOUNTER — Other Ambulatory Visit (HOSPITAL_COMMUNITY): Payer: Self-pay

## 2023-06-18 ENCOUNTER — Encounter: Payer: Self-pay | Admitting: Student

## 2023-06-18 VITALS — BP 135/72 | HR 54 | Temp 97.9°F | Wt 182.0 lb

## 2023-06-18 DIAGNOSIS — Z794 Long term (current) use of insulin: Secondary | ICD-10-CM

## 2023-06-18 DIAGNOSIS — Z7984 Long term (current) use of oral hypoglycemic drugs: Secondary | ICD-10-CM | POA: Diagnosis not present

## 2023-06-18 DIAGNOSIS — I10 Essential (primary) hypertension: Secondary | ICD-10-CM

## 2023-06-18 DIAGNOSIS — Z0000 Encounter for general adult medical examination without abnormal findings: Secondary | ICD-10-CM

## 2023-06-18 DIAGNOSIS — E1140 Type 2 diabetes mellitus with diabetic neuropathy, unspecified: Secondary | ICD-10-CM

## 2023-06-18 DIAGNOSIS — D509 Iron deficiency anemia, unspecified: Secondary | ICD-10-CM

## 2023-06-18 LAB — POCT GLYCOSYLATED HEMOGLOBIN (HGB A1C): Hemoglobin A1C: 7.6 % — AB (ref 4.0–5.6)

## 2023-06-18 LAB — GLUCOSE, CAPILLARY: Glucose-Capillary: 130 mg/dL — ABNORMAL HIGH (ref 70–99)

## 2023-06-18 MED ORDER — INSULIN DEGLUDEC 100 UNIT/ML ~~LOC~~ SOPN
10.0000 [IU] | PEN_INJECTOR | Freq: Every day | SUBCUTANEOUS | 2 refills | Status: DC
Start: 2023-06-18 — End: 2023-10-01
  Filled 2023-06-18: qty 9, 90d supply, fill #0

## 2023-06-18 MED ORDER — PEN NEEDLES 31G X 5 MM MISC
1.0000 | Freq: Every day | 0 refills | Status: DC
Start: 2023-06-18 — End: 2023-09-18
  Filled 2023-06-18: qty 100, 90d supply, fill #0

## 2023-06-18 NOTE — Progress Notes (Unsigned)
CC: HTN/DMII follow up  HPI:  Trevor Fischer is a 62 y.o. with medical history of HTN, HLD, DMII presenting to [facility] for follow up on DMII and to obtain repeat blood work.   Please see problem-based list for further details, assessments, and plans.  Past Medical History:  Diagnosis Date   Allergic rhinitis    Degenerative joint disease of knee    DVT (deep venous thrombosis) (HCC) RLE X 2   Ended anticoagulation in 2012   Flatulence 05/13/2015   GERD (gastroesophageal reflux disease)    History of DVT of lower extremity 10/08   HIV infection (HCC)    Hx MRSA infection    Hyperlipidemia    Hyperlipidemia 11/17/2021   Hypertension    Pulmonary embolism (HCC) 05/14/2017   Superficial thrombophlebitis    Type II diabetes mellitus (HCC) 9/09    Current Outpatient Medications (Endocrine & Metabolic):    insulin degludec (TRESIBA) 100 UNIT/ML FlexTouch Pen, Inject 10 Units into the skin at bedtime.   empagliflozin (JARDIANCE) 25 MG TABS tablet, Take 1 tablet (25 mg total) by mouth daily before breakfast.   Insulin Lispro-aabc (LYUMJEV KWIKPEN) 100 UNIT/ML KwikPen, Inject 10 Units into the skin 4 (four) times daily -  with meals and at bedtime.   tirzepatide (MOUNJARO) 7.5 MG/0.5ML Pen, Inject 7.5 mg into the skin once a week.  Current Outpatient Medications (Cardiovascular):    atorvastatin (LIPITOR) 40 MG tablet, Take 1 tablet (40 mg total) by mouth daily.   losartan (COZAAR) 50 MG tablet, Take 1 tablet (50 mg total) by mouth daily.   sildenafil (VIAGRA) 50 MG tablet, Take 1 tablet (50 mg total) by mouth daily as needed for erectile dysfunction.   triamterene-hydrochlorothiazide (MAXZIDE) 75-50 MG tablet, Take 1/2 tablet by mouth daily.   verapamil (CALAN-SR) 240 MG CR tablet, Take 1 tablet (240 mg total) by mouth daily.   Current Outpatient Medications (Analgesics):    acetaminophen (TYLENOL) 500 MG tablet, Take 2 tablets (1,000 mg total) by mouth 2 (two) times  daily.  Current Outpatient Medications (Hematological):    apixaban (ELIQUIS) 5 MG TABS tablet, Take 1 tablet (5 mg total) by mouth 2 (two) times daily.   iron polysaccharides (FERREX 150) 150 MG capsule, Take 1 capsule (150 mg total) by mouth daily.  Current Outpatient Medications (Other):    Insulin Pen Needle (PEN NEEDLES) 31G X 5 MM MISC, Use daily as directed.   Accu-Chek FastClix Lancets MISC, USE TO CHECK BLOOD SUGAR UP TO 4 TIMES A DAY   ACCU-CHEK GUIDE test strip, USE TO CHECK BLOOD SUGAR UP TO 4 TIMES A DAY   Alcohol Swabs (EASY TOUCH ALCOHOL PREP MEDIUM) 70 % PADS, Use as directed up to 10 daily   baclofen (LIORESAL) 20 MG tablet, Take 1 tablet (20 mg total) by mouth 3 (three) times daily.   bictegravir-emtricitabine-tenofovir AF (BIKTARVY) 50-200-25 MG TABS tablet, TAKE 1 TABLET BY MOUTH DAILY.   Blood Glucose Monitoring Suppl (FREESTYLE LITE) DEVI,    Continuous Blood Gluc Receiver (DEXCOM G7 RECEIVER) DEVI, Continuously monitor sugars. Please change every 10 days.   Continuous Glucose Sensor (DEXCOM G7 SENSOR) MISC, Use as directed to check blood sugar every 10 days.   COVID-19 mRNA Vac-TriS, Pfizer, (PFIZER-BIONT COVID-19 VAC-TRIS) SUSP injection, Inject into the muscle.   COVID-19 mRNA vaccine 2023-2024 (COMIRNATY) syringe, Inject into the muscle.   FREESTYLE LITE test strip,    gabapentin (NEURONTIN) 800 MG tablet, Take 1 tablet (800 mg total) by mouth  3 (three) times daily.   Insulin Pen Needle 32G X 4 MM MISC, Use as directed 6 times daily   Lancets (FREESTYLE) lancets, USE TO CHECK BLOOD SUGAR UP TO 4 TIMES A DAY   LORazepam (ATIVAN) 1 MG tablet, Take 1 tablet 30 minutes prior to leaving house on day of office surgery.  Bring second tablet with you to office on day of office surgery.   LORazepam (ATIVAN) 1 MG tablet, Take 1 tablet (1 mg total) by mouth 30 minutes prior to leaving house on day of surgery and bring 2nd tablet to office   Omega-3 Fatty Acids (FISH OIL) 1000  MG CAPS, Take 1 capsule by mouth 2 (two) times daily.   pantoprazole (PROTONIX) 40 MG tablet, Take 1 tablet (40 mg total) by mouth daily.   silodosin (RAPAFLO) 8 MG CAPS capsule, Take 1 capsule (8 mg total) by mouth daily.   Simethicone 180 MG CAPS, Take 1 capsule (180 mg total) by mouth 3 (three) times daily as needed.   tamsulosin (FLOMAX) 0.4 MG CAPS capsule, Take 1 capsule (0.4 mg total) by mouth daily.   Tdap (BOOSTRIX) 5-2.5-18.5 LF-MCG/0.5 injection, Inject 0.5 mLs into the muscle.  Review of Systems:  Review of system negative unless stated in the problem list or HPI.    Physical Exam:  Vitals:   06/18/23 0832  BP: 135/72  Pulse: (!) 54  Temp: 97.9 F (36.6 C)  TempSrc: Oral  SpO2: 100%  Weight: 182 lb (82.6 kg)   Physical Exam General: NAD HENT: NCAT Lungs: CTAB, no wheeze, rhonchi or rales.  Cardiovascular: Normal heart sounds, no r/m/g, 2+ pulses in all extremities. No LE edema Abdomen: No TTP, normal bowel sounds MSK: No asymmetry or muscle atrophy.  Skin: no lesions noted on exposed skin Neuro: Alert and oriented x4. CN grossly intact Psych: Normal mood and normal affect   Assessment & Plan:   Hypertension Slightly elevated but overall reports good control. Will continue current regimen. Will get repeat BMP this visit as it has been close to one year since last BMP.   Diabetes mellitus with neuropathy (HCC) Pt's A1c checked today and is elevated at 7.6. His regimen is Jardiance 25 mg every day, Tirzepitide 7.5 mg weekly, lispro 14 units TID. He is supposed to be on 10 units but is doing 14 units with meals. His main problem is constant snaking. Given this reason I believe he will benefit from basal insulin. He does not want to increase mounjaro as he does not want to lose any weight. Will decrease his short acting insulin to 10 units only and start 10 units of long acting insulin. Discussed life style modification including limiting carbs and monitoring CGM after  carb intake for feedback as that in itself can improve glycemic control.   Iron deficiency anemia Pt with previously normal hgb but iron deficiency. Iron was 55 Tsat 14 % in 11/2022. He is taking iron 150 mg every day. Repeat iron panel this visit as planned by PCP.   Healthcare maintenance A1c and Flu shot this visit.    See Encounters Tab for problem based charting.  Patient Discussed with Dr. Joanie Coddington, MD Eligha Bridegroom. [facility] Internal Medicine Residency, PGY-3

## 2023-06-18 NOTE — Patient Instructions (Addendum)
[name], it was a pleasure seeing you today! You endorsed feeling well today. Below are some of the things we talked about this visit. We look forward to seeing you in the follow up appointment!  Today we discussed: For your diabetes, we will decrease your insulin lispro to 10 units three times with meals from 14 units and add 10 units of long acting insulin daily. We will continue the [facility] and [place] as previously.  Continue your blood pressure medicines.  We will check your kidney function and iron levels today.  Your A1c is 7.6. Our goal for you is to have it less than 7. Continue the good work.   I have ordered the following labs today:   Lab Orders         Glucose, capillary         Iron, TIBC and Ferritin Panel         BMP8+Anion Gap         POC Hbg A1C       Referrals ordered today:   Referral Orders  No referral(s) requested today     I have ordered the following medication/changed the following medications:   Stop the following medications: Medications Discontinued During This Encounter  Medication Reason   chlorpheniramine (CHLOR-TRIMETON) 4 MG tablet Patient has not taken in last 30 days     Start the following medications: No orders of the defined types were placed in this encounter.    Follow-up: 3 month follow up   Please make sure to arrive 15 minutes prior to your next appointment. If you arrive late, you may be asked to reschedule.   We look forward to seeing you next time. Please call our clinic at [phone] if you have any questions or concerns. The best time to call is Monday-Friday from 9am-4pm, but there is someone available 24/7. If after hours or the weekend, call the main hospital number and ask for the Internal Medicine Resident On-Call. If you need medication refills, please notify your pharmacy one week in advance and they will send [name] a request.  Thank you for letting us take part in your care. Wishing you the  best!  Thank you, Gwenevere Abbot, MD

## 2023-06-19 DIAGNOSIS — D509 Iron deficiency anemia, unspecified: Secondary | ICD-10-CM | POA: Insufficient documentation

## 2023-06-19 NOTE — Assessment & Plan Note (Signed)
Pt with previously normal hgb but iron deficiency. Iron was 55 Tsat 14 % in 11/2022. He is taking iron 150 mg every day. Repeat iron panel this visit as planned by PCP.

## 2023-06-19 NOTE — Assessment & Plan Note (Signed)
Pt's A1c checked today and is elevated at 7.6. His regimen is Jardiance 25 mg every day, Tirzepitide 7.5 mg weekly, lispro 14 units TID. He is supposed to be on 10 units but is doing 14 units with meals. His main problem is constant snaking. Given this reason I believe he will benefit from basal insulin. He does not want to increase mounjaro as he does not want to lose any weight. Will decrease his short acting insulin to 10 units only and start 10 units of long acting insulin. Discussed life style modification including limiting carbs and monitoring CGM after carb intake for feedback as that in itself can improve glycemic control.

## 2023-06-19 NOTE — Assessment & Plan Note (Signed)
A1c and Flu shot this visit.

## 2023-06-19 NOTE — Assessment & Plan Note (Signed)
Slightly elevated but overall reports good control. Will continue current regimen. Will get repeat BMP this visit as it has been close to one year since last BMP.

## 2023-06-25 ENCOUNTER — Other Ambulatory Visit: Payer: Self-pay | Admitting: *Deleted

## 2023-06-25 ENCOUNTER — Other Ambulatory Visit: Payer: Self-pay | Admitting: Student

## 2023-06-25 ENCOUNTER — Other Ambulatory Visit (HOSPITAL_COMMUNITY): Payer: Self-pay

## 2023-06-25 DIAGNOSIS — E1140 Type 2 diabetes mellitus with diabetic neuropathy, unspecified: Secondary | ICD-10-CM

## 2023-06-25 DIAGNOSIS — G8929 Other chronic pain: Secondary | ICD-10-CM

## 2023-06-26 ENCOUNTER — Other Ambulatory Visit (HOSPITAL_COMMUNITY): Payer: Self-pay

## 2023-06-26 ENCOUNTER — Other Ambulatory Visit: Payer: Self-pay

## 2023-06-26 MED ORDER — DEXCOM G7 SENSOR MISC
1.0000 "application " | 3 refills | Status: DC
  Filled 2023-06-26: qty 3, 30d supply, fill #0
  Filled 2023-07-26: qty 3, 30d supply, fill #1
  Filled 2023-08-23: qty 3, 30d supply, fill #2
  Filled 2023-12-18 – 2024-03-06 (×2): qty 3, 30d supply, fill #3
  Filled 2024-04-04 – 2024-06-27 (×3): qty 3, 30d supply, fill #4

## 2023-06-26 MED ORDER — BACLOFEN 20 MG PO TABS
20.0000 mg | ORAL_TABLET | Freq: Three times a day (TID) | ORAL | 2 refills | Status: DC
  Filled 2023-06-26: qty 90, 30d supply, fill #0
  Filled 2023-07-22: qty 90, 30d supply, fill #1
  Filled 2023-08-03 – 2023-08-23 (×2): qty 90, 30d supply, fill #2

## 2023-06-27 ENCOUNTER — Other Ambulatory Visit (HOSPITAL_COMMUNITY): Payer: Self-pay

## 2023-06-27 NOTE — Progress Notes (Signed)
Internal Medicine Clinic Attending  Case discussed with the resident at the time of the visit.  We reviewed the resident's history and exam and pertinent patient test results.  I agree with the assessment, diagnosis, and plan of care documented in the resident's note.  Please note main problem is snacking, not snaking.  Otherwise, agree with note.  (Dictation error)  Debe Coder, MD

## 2023-07-04 ENCOUNTER — Other Ambulatory Visit: Payer: Self-pay

## 2023-07-04 NOTE — Progress Notes (Signed)
Specialty Pharmacy Refill Coordination Note  Trevor Fischer is a 62 y.o. male contacted today regarding refills of specialty medication(s) Bictegravir-Emtricitab-Tenofov   Patient requested Delivery   Delivery date: 07/13/23   Verified address: [address], [place], [address]   Medication will be filled on 07/12/2023.

## 2023-07-05 ENCOUNTER — Other Ambulatory Visit (HOSPITAL_COMMUNITY): Payer: Self-pay

## 2023-07-12 ENCOUNTER — Other Ambulatory Visit: Payer: Self-pay

## 2023-07-22 ENCOUNTER — Other Ambulatory Visit (HOSPITAL_COMMUNITY): Payer: Self-pay

## 2023-07-23 ENCOUNTER — Other Ambulatory Visit (HOSPITAL_COMMUNITY): Payer: Self-pay

## 2023-07-23 ENCOUNTER — Other Ambulatory Visit: Payer: Self-pay

## 2023-07-27 ENCOUNTER — Other Ambulatory Visit (HOSPITAL_COMMUNITY): Payer: Self-pay

## 2023-08-01 ENCOUNTER — Other Ambulatory Visit (HOSPITAL_COMMUNITY): Payer: Self-pay

## 2023-08-01 ENCOUNTER — Other Ambulatory Visit: Payer: Self-pay

## 2023-08-02 ENCOUNTER — Other Ambulatory Visit (HOSPITAL_COMMUNITY): Payer: Self-pay

## 2023-08-02 ENCOUNTER — Other Ambulatory Visit (HOSPITAL_COMMUNITY): Payer: Self-pay | Admitting: Pharmacy Technician

## 2023-08-02 NOTE — Progress Notes (Signed)
Specialty Pharmacy Refill Coordination Note  Trevor Fischer is a 62 y.o. male contacted today regarding refills of specialty medication(s) Bictegravir-Emtricitab-Tenofov   Patient requested Delivery   Delivery date: 08/17/23   Verified address: [address] [place]   Medication will be filled on 08/16/23.

## 2023-08-03 ENCOUNTER — Other Ambulatory Visit (HOSPITAL_COMMUNITY): Payer: Self-pay

## 2023-08-10 ENCOUNTER — Other Ambulatory Visit (HOSPITAL_COMMUNITY): Payer: Self-pay

## 2023-08-10 ENCOUNTER — Other Ambulatory Visit: Payer: Self-pay | Admitting: Student

## 2023-08-10 DIAGNOSIS — E1140 Type 2 diabetes mellitus with diabetic neuropathy, unspecified: Secondary | ICD-10-CM

## 2023-08-10 MED ORDER — LYUMJEV KWIKPEN 100 UNIT/ML ~~LOC~~ SOPN
10.0000 [IU] | PEN_INJECTOR | Freq: Three times a day (TID) | SUBCUTANEOUS | 2 refills | Status: DC
  Filled 2023-08-10: qty 12, 30d supply, fill #0
  Filled 2023-09-18: qty 12, 30d supply, fill #1
  Filled 2023-10-15 – 2023-11-12 (×2): qty 12, 30d supply, fill #2

## 2023-08-11 ENCOUNTER — Other Ambulatory Visit (HOSPITAL_COMMUNITY): Payer: Self-pay

## 2023-08-13 ENCOUNTER — Other Ambulatory Visit (HOSPITAL_COMMUNITY): Payer: Self-pay

## 2023-08-16 ENCOUNTER — Other Ambulatory Visit (HOSPITAL_COMMUNITY): Payer: Self-pay

## 2023-08-16 ENCOUNTER — Other Ambulatory Visit: Payer: Self-pay

## 2023-08-17 ENCOUNTER — Other Ambulatory Visit (HOSPITAL_COMMUNITY): Payer: Self-pay

## 2023-08-23 ENCOUNTER — Other Ambulatory Visit (HOSPITAL_COMMUNITY): Payer: Self-pay

## 2023-08-27 DIAGNOSIS — R972 Elevated prostate specific antigen [PSA]: Secondary | ICD-10-CM | POA: Diagnosis not present

## 2023-09-03 ENCOUNTER — Other Ambulatory Visit (HOSPITAL_COMMUNITY): Payer: Self-pay

## 2023-09-03 ENCOUNTER — Encounter: Payer: Self-pay | Admitting: Student

## 2023-09-03 ENCOUNTER — Ambulatory Visit (INDEPENDENT_AMBULATORY_CARE_PROVIDER_SITE_OTHER): Payer: Commercial Managed Care - PPO | Admitting: Student

## 2023-09-03 ENCOUNTER — Other Ambulatory Visit: Payer: Self-pay

## 2023-09-03 ENCOUNTER — Other Ambulatory Visit (HOSPITAL_COMMUNITY): Payer: Self-pay | Admitting: Pharmacy Technician

## 2023-09-03 VITALS — BP 134/74 | HR 48 | Temp 97.7°F | Ht 74.0 in | Wt 187.5 lb

## 2023-09-03 DIAGNOSIS — Z794 Long term (current) use of insulin: Secondary | ICD-10-CM

## 2023-09-03 DIAGNOSIS — I10 Essential (primary) hypertension: Secondary | ICD-10-CM

## 2023-09-03 DIAGNOSIS — Z86711 Personal history of pulmonary embolism: Secondary | ICD-10-CM | POA: Insufficient documentation

## 2023-09-03 DIAGNOSIS — N401 Enlarged prostate with lower urinary tract symptoms: Secondary | ICD-10-CM | POA: Diagnosis not present

## 2023-09-03 DIAGNOSIS — Z7985 Long-term (current) use of injectable non-insulin antidiabetic drugs: Secondary | ICD-10-CM | POA: Diagnosis not present

## 2023-09-03 DIAGNOSIS — R351 Nocturia: Secondary | ICD-10-CM | POA: Diagnosis not present

## 2023-09-03 DIAGNOSIS — D6859 Other primary thrombophilia: Secondary | ICD-10-CM | POA: Diagnosis not present

## 2023-09-03 DIAGNOSIS — E611 Iron deficiency: Secondary | ICD-10-CM

## 2023-09-03 DIAGNOSIS — R3915 Urgency of urination: Secondary | ICD-10-CM | POA: Diagnosis not present

## 2023-09-03 DIAGNOSIS — N5201 Erectile dysfunction due to arterial insufficiency: Secondary | ICD-10-CM | POA: Diagnosis not present

## 2023-09-03 DIAGNOSIS — Z7984 Long term (current) use of oral hypoglycemic drugs: Secondary | ICD-10-CM

## 2023-09-03 DIAGNOSIS — R972 Elevated prostate specific antigen [PSA]: Secondary | ICD-10-CM | POA: Diagnosis not present

## 2023-09-03 DIAGNOSIS — E1140 Type 2 diabetes mellitus with diabetic neuropathy, unspecified: Secondary | ICD-10-CM | POA: Diagnosis not present

## 2023-09-03 LAB — POCT GLYCOSYLATED HEMOGLOBIN (HGB A1C): Hemoglobin A1C: 6.7 % — AB (ref 4.0–5.6)

## 2023-09-03 LAB — GLUCOSE, CAPILLARY: Glucose-Capillary: 92 mg/dL (ref 70–99)

## 2023-09-03 MED ORDER — POLYSACCHARIDE IRON COMPLEX 150 MG PO CAPS
150.0000 mg | ORAL_CAPSULE | Freq: Every day | ORAL | 0 refills | Status: DC
Start: 2023-09-03 — End: 2024-02-06
  Filled 2023-09-03: qty 90, 90d supply, fill #0

## 2023-09-03 MED ORDER — TADALAFIL 5 MG PO TABS
5.0000 mg | ORAL_TABLET | Freq: Every day | ORAL | 11 refills | Status: DC
  Filled 2023-09-03: qty 30, 30d supply, fill #0
  Filled 2023-09-07 – 2023-09-22 (×3): qty 30, 30d supply, fill #1
  Filled 2023-10-24: qty 30, 30d supply, fill #2
  Filled 2023-11-27: qty 30, 30d supply, fill #3
  Filled 2023-12-23: qty 30, 30d supply, fill #4
  Filled 2024-01-30: qty 30, 30d supply, fill #5
  Filled 2024-03-06: qty 30, 30d supply, fill #6
  Filled 2024-04-04: qty 30, 30d supply, fill #7
  Filled 2024-05-06: qty 30, 30d supply, fill #8
  Filled 2024-06-03: qty 30, 30d supply, fill #9
  Filled 2024-06-14 – 2024-06-27 (×2): qty 30, 30d supply, fill #10
  Filled 2024-07-31: qty 30, 30d supply, fill #11

## 2023-09-03 MED ORDER — MOUNJARO 10 MG/0.5ML ~~LOC~~ SOAJ
10.0000 mg | SUBCUTANEOUS | 3 refills | Status: DC
  Filled 2023-09-03 – 2023-09-18 (×2): qty 2, 28d supply, fill #0
  Filled 2023-10-15: qty 2, 28d supply, fill #1
  Filled 2023-11-23: qty 2, 28d supply, fill #2
  Filled 2023-12-17: qty 2, 28d supply, fill #3

## 2023-09-03 NOTE — Progress Notes (Signed)
Specialty Pharmacy Ongoing Clinical Assessment Note  Trevor Fischer is a 62 y.o. male who is being followed by the specialty pharmacy service for RxSp HIV   Patient's specialty medication(s) reviewed today: Bictegravir-Emtricitab-Tenofov   Missed doses in the last 4 weeks: 0   Patient/Caregiver did not have any additional questions or concerns.   Therapeutic benefit summary: Patient is achieving benefit   Adverse events/side effects summary: No adverse events/side effects   Patient's therapy is appropriate to: Continue    Goals Addressed             This Visit's Progress    Achieve Undetectable HIV Viral Load < 20       Patient is on track. Patient will maintain adherence         Follow up:  6 months  Bobette Mo Specialty Pharmacist

## 2023-09-03 NOTE — Progress Notes (Signed)
Subjective:  CC: diabetes follow up  HPI:  [name] is a 62 y.o. male with a past medical history stated below and presents today for diabetes follow up. Please see problem based assessment and plan for additional details.  Past Medical History:  Diagnosis Date   Allergic rhinitis    Degenerative joint disease of knee    DVT (deep venous thrombosis) (HCC) RLE X 2   Ended anticoagulation in 2012   Flatulence 05/13/2015   GERD (gastroesophageal reflux disease)    History of DVT of lower extremity 10/08   HIV infection (HCC)    Hx MRSA infection    Hyperlipidemia    Hyperlipidemia 11/17/2021   Hypertension    Pulmonary embolism (HCC) 05/14/2017   Superficial thrombophlebitis    Type II diabetes mellitus (HCC) 9/09    Current Outpatient Medications on File Prior to Visit  Medication Sig Dispense Refill   Accu-Chek FastClix Lancets MISC USE TO CHECK BLOOD SUGAR UP TO 4 TIMES A DAY 102 each PRN   ACCU-CHEK GUIDE test strip USE TO CHECK BLOOD SUGAR UP TO 4 TIMES A DAY 400 strip PRN   acetaminophen (TYLENOL) 500 MG tablet Take 2 tablets (1,000 mg total) by mouth 2 (two) times daily. 30 tablet 3   Alcohol Swabs (EASY TOUCH ALCOHOL PREP MEDIUM) 70 % PADS Use as directed up to 10 daily 1000 each 3   apixaban (ELIQUIS) 5 MG TABS tablet Take 1 tablet (5 mg total) by mouth 2 (two) times daily. 180 tablet 2   atorvastatin (LIPITOR) 40 MG tablet Take 1 tablet (40 mg total) by mouth daily. 90 tablet 3   baclofen (LIORESAL) 20 MG tablet Take 1 tablet (20 mg total) by mouth 3 (three) times daily. 90 tablet 2   bictegravir-emtricitabine-tenofovir AF (BIKTARVY) 50-200-25 MG TABS tablet TAKE 1 TABLET BY MOUTH DAILY. 30 tablet 11   Blood Glucose Monitoring Suppl (FREESTYLE LITE) DEVI      Continuous Blood Gluc Receiver (DEXCOM G7 RECEIVER) DEVI Continuously monitor sugars. Please change every 10 days. 9 each 3   Continuous Glucose Sensor (DEXCOM G7 SENSOR) MISC Use as directed to  check blood sugar every 10 days. 4 each 3   COVID-19 mRNA Vac-TriS, Pfizer, (PFIZER-BIONT COVID-19 VAC-TRIS) SUSP injection Inject into the muscle. 0.3 mL 0   COVID-19 mRNA vaccine 2023-2024 (COMIRNATY) syringe Inject into the muscle. 0.3 mL 0   empagliflozin (JARDIANCE) 25 MG TABS tablet Take 1 tablet (25 mg total) by mouth daily before breakfast. 90 tablet 3   FREESTYLE LITE test strip      gabapentin (NEURONTIN) 800 MG tablet Take 1 tablet (800 mg total) by mouth 3 (three) times daily. 270 tablet 3   insulin degludec (TRESIBA) 100 UNIT/ML FlexTouch Pen Inject 10 Units into the skin at bedtime. 3 mL 2   Insulin Lispro-aabc (LYUMJEV KWIKPEN) 100 UNIT/ML KwikPen Inject 10 Units into the skin 4 (four) times daily -  with meals and at bedtime. 12 mL 2   Insulin Pen Needle (PEN NEEDLES) 31G X 5 MM MISC Use daily as directed. 100 each 0   Insulin Pen Needle 32G X 4 MM MISC Use as directed 6 times daily 100 each 8   Lancets (FREESTYLE) lancets USE TO CHECK BLOOD SUGAR UP TO 4 TIMES A DAY 100 each 99   LORazepam (ATIVAN) 1 MG tablet Take 1 tablet 30 minutes prior to leaving house on day of office surgery.  Bring second tablet with you to  office on day of office surgery. 2 tablet 0   LORazepam (ATIVAN) 1 MG tablet Take 1 tablet (1 mg total) by mouth 30 minutes prior to leaving house on day of surgery and bring 2nd tablet to office 2 tablet 0   losartan (COZAAR) 50 MG tablet Take 1 tablet (50 mg total) by mouth daily. 90 tablet 3   Omega-3 Fatty Acids (FISH OIL) 1000 MG CAPS Take 1 capsule by mouth 2 (two) times daily.     pantoprazole (PROTONIX) 40 MG tablet Take 1 tablet (40 mg total) by mouth daily. 90 tablet 3   sildenafil (VIAGRA) 50 MG tablet Take 1 tablet (50 mg total) by mouth daily as needed for erectile dysfunction. 30 tablet 0   silodosin (RAPAFLO) 8 MG CAPS capsule Take 1 capsule (8 mg total) by mouth daily. 30 capsule 11   Simethicone 180 MG CAPS Take 1 capsule (180 mg total) by mouth 3  (three) times daily as needed. 60 capsule 3   tamsulosin (FLOMAX) 0.4 MG CAPS capsule Take 1 capsule (0.4 mg total) by mouth daily. 30 capsule 11   Tdap (BOOSTRIX) 5-2.5-18.5 LF-MCG/0.5 injection Inject 0.5 mLs into the muscle. 0.5 mL 0   triamterene-hydrochlorothiazide (MAXZIDE) 75-50 MG tablet Take 1/2 tablet by mouth daily. 45 tablet 3   verapamil (CALAN-SR) 240 MG CR tablet Take 1 tablet (240 mg total) by mouth daily. 90 tablet 3   No current facility-administered medications on file prior to visit.    Family History  Problem Relation Age of Onset   Diabetes Father    Kidney disease Father    Heart failure Father    Hyperlipidemia Father    Hypertension Father    Osteoarthritis Mother     Social History   Socioeconomic History   Marital status: Married    Spouse name: Not on file   Number of children: Not on file   Years of education: Not on file   Highest education level: Not on file  Occupational History   Not on file  Tobacco Use   Smoking status: Former    Current packs/day: 0.00    Average packs/day: 1 pack/day for 28.0 years (28.0 ttl pk-yrs)    Types: Cigarettes    Start date: 07/02/1977    Quit date: 07/02/2005    Years since quitting: 18.1   Smokeless tobacco: Never  Vaping Use   Vaping status: Never Used  Substance and Sexual Activity   Alcohol use: Yes    Alcohol/week: 0.0 standard drinks of alcohol    Comment: 05/14/2017 "1-2 drinks/year"   Drug use: No   Sexual activity: Not Currently    Partners: Male    Comment: declined condoms  Other Topics Concern   Not on file  Social History Narrative   Lives in [place], with partner of 26 years    Works as Psychologist, sport and exercise at [employer]   Has [employer]   Social Determinants of Health   Financial Resource Strain: Not on file  Food Insecurity: No Food Insecurity (08/07/2022)   Hunger Vital Sign    Worried About Running Out of Food in the Last Year: Never true    Ran Out of Food in the Last  Year: Never true  Transportation Needs: No Transportation Needs (05/14/2023)   PRAPARE - Administrator, Civil Service (Medical): No    Lack of Transportation (Non-Medical): No  Physical Activity: Not on file  Stress: Not on file  Social Connections: Not  on file  Intimate Partner Violence: Not At Risk (05/14/2023)   Humiliation, Afraid, Rape, and Kick questionnaire    Fear of Current or Ex-Partner: No    Emotionally Abused: No    Physically Abused: No    Sexually Abused: No    Review of Systems: ROS negative except for what is noted on the assessment and plan.  Objective:   Vitals:   09/03/23 0914 09/03/23 0917  BP: (!) 143/71 134/74  Pulse: (!) 53 (!) 48  Temp: 97.7 F (36.5 C)   TempSrc: Oral   SpO2: 100%   Weight: 187 lb 8 oz (85 kg)   Height: 6\' 2"  (1.88 m)     Physical Exam: Constitutional: well-appearing man sitting in no acute distress HENT: normocephalic atraumatic, mucous membranes moist Eyes: conjunctiva non-erythematous Neck: supple, No JVP Cardiovascular: regular rate and rhythm, no m/r/g Pulmonary/Chest: normal work of breathing on room air, lungs clear to auscultation bilaterally Abdominal: soft, non-tender, non-distended MSK: normal bulk and tone Neurological: alert & oriented x 3, Skin - warm and dry Psych: Pleasant mood and affect    Assessment & Plan:   Hypertension Slightly elevated. Patient did not take medications today he just got off of work. Asymptomatic. He also reports increased weight gain since starting long acting insulin, which is likely contributing. If continues to be minimally elevated, will consider a starting dose of third antihypertensive medication.   Diabetes mellitus with neuropathy (HCC) A1c today improved to 6.7 from 7.6 3 months ago on current 10 units tresiba, TID 10 units of super fast acting Lispro, and Mounjaro 7.5 mg weekly. He is also on Jardiance 25 mg daily.   He has noted increased in weight and desire to  eat since starting long acting insulin. However, his fasting and mealtime glucose curves have improved. Reviewed his Dexcom data and uit supports patient' information.  Lab Results  Component Value Date   HGBA1C 6.7 (A) 09/03/2023   Diet - increased in sweet treats over the past month Exercise -  discussed need for increasing cardio work outs Last Cr and GFR: UTD Last Urine microalbumin: UTD ARB/ACEi?: on ARB SGLT2i?: Yes On statin  Plan: Decrease insulin to 8 u/daily Increase Mounjaro to 10 mg weekly Continue the rest of the regimen as is  Hypercoagulable state (HCC) Continues on Eliquis 5 mg BID. No recent episode or symptoms of DVT or new PE    Return in about 3 months (around 12/02/2023) for DM management.  Patient discussed with Dr. Gardiner Ramus, MD [facility]  09/03/2023, 10:15 AM

## 2023-09-03 NOTE — Assessment & Plan Note (Signed)
Slightly elevated. Patient did not take medications today he just got off of work. Asymptomatic. He also reports increased weight gain since starting long acting insulin, which is likely contributing. If continues to be minimally elevated, will consider a starting dose of third antihypertensive medication.

## 2023-09-03 NOTE — Patient Instructions (Signed)
Thank you, Trevor Fischer for allowing [name] to provide your care today. Today we discussed   Diabetes: ONCE YOU GET YOUR NEW MOUNJARO DOSE [name] - long acting insulin decrease to 8 units daily.   Transition to using the new Mounjaro dose at 10 mg weekly Continue the Lispro 10 units with meals and large snacks  Iron deficiency anemia Continue taking Ferrex every other day If you get constipated, please add Miralax  I have ordered the following labs for you:   Lab Orders         Glucose, capillary         POC Hbg A1C      I will call if any are abnormal. All of your labs can be accessed through "My Chart".   My Chart Access: [email or web address]  Please follow-up in: 3 months    We look forward to seeing you next time. Please call our clinic at [phone] if you have any questions or concerns. The best time to call is Monday-Friday from 9am-4pm, but there is someone available 24/7. If after hours or the weekend, call the main hospital number and ask for the Internal Medicine Resident On-Call. If you need medication refills, please notify your pharmacy one week in advance and they will send [name] a request.   Thank you for letting us take part in your care. Wishing you the best!  Morene Crocker, MD 09/03/2023, 9:33 AM [facility]

## 2023-09-03 NOTE — Progress Notes (Signed)
Specialty Pharmacy Refill Coordination Note  Trevor Fischer is a 62 y.o. male contacted today regarding refills of specialty medication(s) Bictegravir-Emtricitab-Tenofov   Patient requested Delivery   Delivery date: 09/13/23   Verified address: [address] [place]   Medication will be filled on 09/12/23.

## 2023-09-03 NOTE — Assessment & Plan Note (Signed)
A1c today improved to 6.7 from 7.6 3 months ago on current 10 units tresiba, TID 10 units of super fast acting Lispro, and Mounjaro 7.5 mg weekly. He is also on Jardiance 25 mg daily.   He has noted increased in weight and desire to eat since starting long acting insulin. However, his fasting and mealtime glucose curves have improved. Reviewed his Dexcom data and uit supports patient' information.  Lab Results  Component Value Date   HGBA1C 6.7 (A) 09/03/2023   Diet - increased in sweet treats over the past month Exercise -  discussed need for increasing cardio work outs Last Cr and GFR: UTD Last Urine microalbumin: UTD ARB/ACEi?: on ARB SGLT2i?: Yes On statin  Plan: Decrease insulin to 8 u/daily Increase Mounjaro to 10 mg weekly Continue the rest of the regimen as is

## 2023-09-03 NOTE — Assessment & Plan Note (Signed)
Continues on Eliquis 5 mg BID. No recent episode or symptoms of DVT or new PE

## 2023-09-04 NOTE — Progress Notes (Signed)
Internal Medicine Clinic Attending  Case discussed with Dr. Gomez-Caraballo  At the time of the visit.  We reviewed the resident's history and exam and pertinent patient test results.  I agree with the assessment, diagnosis, and plan of care documented in the resident's note.  

## 2023-09-05 ENCOUNTER — Other Ambulatory Visit: Payer: Self-pay | Admitting: Urology

## 2023-09-05 DIAGNOSIS — R972 Elevated prostate specific antigen [PSA]: Secondary | ICD-10-CM

## 2023-09-07 ENCOUNTER — Other Ambulatory Visit (HOSPITAL_COMMUNITY): Payer: Self-pay

## 2023-09-07 ENCOUNTER — Other Ambulatory Visit (HOSPITAL_BASED_OUTPATIENT_CLINIC_OR_DEPARTMENT_OTHER): Payer: Self-pay

## 2023-09-07 MED ORDER — COVID-19 MRNA VAC-TRIS(PFIZER) 30 MCG/0.3ML IM SUSY
0.3000 mL | PREFILLED_SYRINGE | Freq: Once | INTRAMUSCULAR | 0 refills | Status: AC
  Filled 2023-09-07: qty 0.3, 1d supply, fill #0

## 2023-09-12 ENCOUNTER — Other Ambulatory Visit: Payer: Self-pay

## 2023-09-18 ENCOUNTER — Other Ambulatory Visit: Payer: Self-pay | Admitting: Internal Medicine

## 2023-09-18 ENCOUNTER — Other Ambulatory Visit (HOSPITAL_COMMUNITY): Payer: Self-pay

## 2023-09-18 ENCOUNTER — Other Ambulatory Visit: Payer: Self-pay

## 2023-09-18 DIAGNOSIS — E1140 Type 2 diabetes mellitus with diabetic neuropathy, unspecified: Secondary | ICD-10-CM

## 2023-09-18 MED ORDER — DEXCOM G7 SENSOR MISC
3 refills | Status: AC
  Filled 2023-09-18: qty 9, 90d supply, fill #0
  Filled 2023-12-17: qty 9, 90d supply, fill #1
  Filled 2024-04-04: qty 9, 90d supply, fill #2
  Filled 2024-07-27 – 2024-08-04 (×2): qty 9, 90d supply, fill #3

## 2023-09-19 ENCOUNTER — Other Ambulatory Visit: Payer: Self-pay

## 2023-09-19 ENCOUNTER — Other Ambulatory Visit (HOSPITAL_COMMUNITY): Payer: Self-pay

## 2023-09-19 MED ORDER — PEN NEEDLES 31G X 5 MM MISC
1.0000 | Freq: Every day | 1 refills | Status: AC
  Filled 2023-09-19: qty 100, 90d supply, fill #0
  Filled 2023-11-26: qty 100, 90d supply, fill #1
  Filled 2023-11-30: qty 100, 20d supply, fill #1

## 2023-09-21 ENCOUNTER — Other Ambulatory Visit (HOSPITAL_COMMUNITY): Payer: Self-pay

## 2023-09-24 ENCOUNTER — Other Ambulatory Visit (HOSPITAL_COMMUNITY): Payer: Self-pay

## 2023-10-01 ENCOUNTER — Other Ambulatory Visit (HOSPITAL_COMMUNITY): Payer: Self-pay

## 2023-10-01 ENCOUNTER — Other Ambulatory Visit: Payer: Self-pay | Admitting: Student

## 2023-10-01 DIAGNOSIS — E1140 Type 2 diabetes mellitus with diabetic neuropathy, unspecified: Secondary | ICD-10-CM

## 2023-10-01 MED ORDER — TRESIBA FLEXTOUCH 100 UNIT/ML ~~LOC~~ SOPN
8.0000 [IU] | PEN_INJECTOR | Freq: Every day | SUBCUTANEOUS | 2 refills | Status: DC
  Filled 2023-10-01: qty 3, 30d supply, fill #0
  Filled 2023-10-16 – 2023-10-18 (×2): qty 3, 30d supply, fill #1
  Filled 2023-11-26: qty 3, 30d supply, fill #2

## 2023-10-05 ENCOUNTER — Other Ambulatory Visit (HOSPITAL_COMMUNITY): Payer: Self-pay

## 2023-10-05 ENCOUNTER — Other Ambulatory Visit: Payer: Self-pay

## 2023-10-05 NOTE — Progress Notes (Signed)
 Specialty Pharmacy Refill Coordination Note  Trevor Fischer is a 63 y.o. male contacted today regarding refills of specialty medication(s) Bictegravir-Emtricitab-Tenofov (BIKTARVY )   Patient requested Delivery   Delivery date: 10/11/23   Verified address: [address]   [name] [id number]   Medication will be filled on 10/10/23.

## 2023-10-16 ENCOUNTER — Other Ambulatory Visit (HOSPITAL_COMMUNITY): Payer: Self-pay

## 2023-10-18 ENCOUNTER — Other Ambulatory Visit: Payer: Self-pay

## 2023-10-18 ENCOUNTER — Other Ambulatory Visit (HOSPITAL_COMMUNITY): Payer: Self-pay

## 2023-10-18 ENCOUNTER — Other Ambulatory Visit: Payer: Self-pay | Admitting: Student

## 2023-10-18 DIAGNOSIS — G8929 Other chronic pain: Secondary | ICD-10-CM

## 2023-10-19 ENCOUNTER — Other Ambulatory Visit (HOSPITAL_COMMUNITY): Payer: Self-pay

## 2023-10-19 MED ORDER — BACLOFEN 20 MG PO TABS
20.0000 mg | ORAL_TABLET | Freq: Three times a day (TID) | ORAL | 2 refills | Status: DC
  Filled 2023-10-19: qty 90, 30d supply, fill #0
  Filled 2023-11-13: qty 90, 30d supply, fill #1
  Filled 2023-12-17: qty 90, 30d supply, fill #2

## 2023-10-22 NOTE — Telephone Encounter (Signed)
 error

## 2023-10-24 ENCOUNTER — Other Ambulatory Visit (HOSPITAL_COMMUNITY): Payer: Self-pay

## 2023-10-29 ENCOUNTER — Other Ambulatory Visit (HOSPITAL_COMMUNITY): Payer: Self-pay

## 2023-10-29 ENCOUNTER — Other Ambulatory Visit: Payer: Commercial Managed Care - PPO

## 2023-10-31 ENCOUNTER — Ambulatory Visit
Admission: RE | Admit: 2023-10-31 | Discharge: 2023-10-31 | Disposition: A | Discharge status: Home / Self Care | Payer: Commercial Managed Care - PPO | Source: Ambulatory Visit | Attending: Urology | Admitting: Urology

## 2023-10-31 DIAGNOSIS — R972 Elevated prostate specific antigen [PSA]: Secondary | ICD-10-CM | POA: Diagnosis not present

## 2023-10-31 DIAGNOSIS — E119 Type 2 diabetes mellitus without complications: Secondary | ICD-10-CM | POA: Diagnosis not present

## 2023-10-31 MED ORDER — GADOPICLENOL 0.5 MMOL/ML IV SOLN
9.0000 mL | Freq: Once | INTRAVENOUS | Status: AC | PRN
  Administered 2023-10-31: 9 mL via INTRAVENOUS

## 2023-11-05 ENCOUNTER — Other Ambulatory Visit: Payer: Self-pay | Admitting: Pharmacy Technician

## 2023-11-05 ENCOUNTER — Other Ambulatory Visit (HOSPITAL_COMMUNITY): Payer: Self-pay

## 2023-11-05 NOTE — Progress Notes (Signed)
Specialty Pharmacy Refill Coordination Note  Trevor Fischer is a 63 y.o. male contacted today regarding refills of specialty medication(s) Bictegravir-Emtricitab-Tenofov [name])   Patient requested Delivery   Delivery date: 11/09/23   Verified address: Patient address [address]  [place]   Medication will be filled on 11/08/23.

## 2023-11-09 ENCOUNTER — Ambulatory Visit: Payer: Commercial Managed Care - PPO | Admitting: Internal Medicine

## 2023-11-09 VITALS — BP 130/56 | HR 62 | Temp 98.2°F | Ht 74.0 in | Wt 183.2 lb

## 2023-11-09 DIAGNOSIS — G44229 Chronic tension-type headache, not intractable: Secondary | ICD-10-CM | POA: Diagnosis not present

## 2023-11-09 DIAGNOSIS — R519 Headache, unspecified: Secondary | ICD-10-CM | POA: Insufficient documentation

## 2023-11-09 DIAGNOSIS — I10 Essential (primary) hypertension: Secondary | ICD-10-CM | POA: Diagnosis not present

## 2023-11-09 HISTORY — DX: Headache, unspecified: R51.9

## 2023-11-09 NOTE — Progress Notes (Signed)
 Subjective:  CC: headache  HPI:  Trevor Fischer is a 63 y.o. male with a past medical history of diabetes, venous insufficiency, hypercoagulable state, HTN, and HLD who presents today for headache.   He started having headaches in 10/24. They occur daily with pain in frontal and temporal areas. He denies vision changes. He is taking ibuprofen  400 mg with multiple doses daily. When he does not take medication, his headaches are much worse. He denies jaw pain.  He was last seen in clinic 12/24 for diabetes follow-up.   Please see problem based assessment and plan for additional details.  Past Medical History:  Diagnosis Date   Allergic rhinitis    Degenerative joint disease of knee    DVT (deep venous thrombosis) (HCC) RLE X 2   Ended anticoagulation in 2012   Flatulence 05/13/2015   GERD (gastroesophageal reflux disease)    History of DVT of lower extremity 10/08   HIV infection (HCC)    Hx MRSA infection    Hyperlipidemia    Hyperlipidemia 11/17/2021   Hypertension    Pulmonary embolism (HCC) 05/14/2017   Superficial thrombophlebitis    Type II diabetes mellitus (HCC) 9/09    MEDICATIONS:  Dexcom Tamsulosin  0.4 mg Triamterene -hydrochlorothiazide  75-50 mg Biktarvy  50-200-25 mg Tirzepatide  10 mg weekly Insulin  10 units TID Iron  po Simethicone  Verapamil  240 mg Jardiance  25 mg Atorvastatin  40 mg Pantoprazole  40 mg Apixaban  5 mg bid Tadalafil  5 mg Losartan  50 mg Silodosin  8mg  Tresiba  8 units at bedtime Gabapentin  800 mg TID  Family History  Problem Relation Age of Onset   Diabetes Father    Kidney disease Father    Heart failure Father    Hyperlipidemia Father    Hypertension Father    Osteoarthritis Mother     Past Surgical History:  Procedure Laterality Date   ENDOVENOUS ABLATION SAPHENOUS VEIN W/ LASER  07-17-2011 LEFT GRERATER SAPHENOUS VEIN AND STAB PHLEBECTOMIES   10-20   LEFT LEG   ENDOVENOUS ABLATION SAPHENOUS VEIN W/ LASER Left  08/09/2022   endovenous laser ablation left small saphenous vein and stab phlebectomy 10-20 incisions left leg by Medford Blade MD   VEIN LIGATION AND STRIPPING Bilateral      Social History   Socioeconomic History   Marital status: Married    Spouse name: Not on file   Number of children: Not on file   Years of education: Not on file   Highest education level: Not on file  Occupational History   Not on file  Tobacco Use   Smoking status: Former    Current packs/day: 0.00    Average packs/day: 1 pack/day for 28.0 years (28.0 ttl pk-yrs)    Types: Cigarettes    Start date: 07/02/1977    Quit date: 07/02/2005    Years since quitting: 18.3   Smokeless tobacco: Never  Vaping Use   Vaping status: Never Used  Substance and Sexual Activity   Alcohol  use: Yes    Alcohol /week: 0.0 standard drinks of alcohol     Comment: 05/14/2017 1-2 drinks/year   Drug use: No   Sexual activity: Not Currently    Partners: Male    Comment: declined condoms  Other Topics Concern   Not on file  Social History Narrative   Lives in [place], with partner of 26 years    Works as Psychologist, sport and exercise at [employer]   Has [employer]   Social Drivers of Corporate Investment Banker Strain: Not on file  Food Insecurity: No Food Insecurity (08/07/2022)   Hunger Vital Sign    Worried About Running Out of Food in the Last Year: Never true    Ran Out of Food in the Last Year: Never true  Transportation Needs: No Transportation Needs (05/14/2023)   PRAPARE - Administrator, Civil Service (Medical): No    Lack of Transportation (Non-Medical): No  Physical Activity: Not on file  Stress: Not on file  Social Connections: Not on file  Intimate Partner Violence: Not At Risk (05/14/2023)   Humiliation, Afraid, Rape, and Kick questionnaire    Fear of Current or Ex-Partner: No    Emotionally Abused: No    Physically Abused: No    Sexually Abused: No    Review of Systems: ROS negative except  for what is noted on the assessment and plan.  Objective:   Vitals:   11/09/23 0901  BP: (!) 130/56  Pulse: 62  Temp: 98.2 F (36.8 C)  TempSrc: Oral  SpO2: 100%  Weight: 183 lb 3.2 oz (83.1 kg)  Height: 6' 2 (1.88 m)    Physical Exam: Constitutional: well-appearing, in no acute distress Cardiovascular: regular rate and rhythm, no m/r/g Pulmonary/Chest: normal work of breathing on room air, lungs clear to auscultation bilaterally Abdominal: soft, non-tender, non-distended MSK: normal bulk and tone Neurological: alert & oriented x 3, normal gait Skin: warm and dry  Assessment & Plan:  Headache A: GCA versus rebound headache versus tension type HA With new pattern of headache will get inflammatory markers. He recently went to ophthalmology for eye exam. P: ESR, CRP Checking BMP in setting of multiple dose of NSAIDs daily Encouraged cessation of NSAIDs.  Hypertension Blood pressure well controlled at 130/56. Current medications include Triamterene -hydrochlorothiazide  75-50 mg, Verapamil  240 mg, Losartan  50 mg.  P: Last BMP 9/24 within normal limits.  -repeat BMP   Patient discussed with Dr. Jerrell Pagan Lus Kriegel, D.O. [facility]  PGY-3 Pager: [phone]  Phone: [phone] Date 11/09/2023  Time 2:53 PM

## 2023-11-09 NOTE — Assessment & Plan Note (Addendum)
 A: GCA versus rebound headache versus tension type HA With new pattern of headache will get inflammatory markers. He recently went to ophthalmology for eye exam. P: ESR, CRP Checking BMP in setting of multiple dose of NSAIDs daily Encouraged cessation of NSAIDs.

## 2023-11-09 NOTE — Assessment & Plan Note (Signed)
 Blood pressure well controlled at 130/56. Current medications include Triamterene -hydrochlorothiazide  75-50 mg, Verapamil  240 mg, Losartan  50 mg.  P: Last BMP 9/24 within normal limits.  -repeat BMP

## 2023-11-09 NOTE — Patient Instructions (Addendum)
 Thank you, Mr.Damany L Finfrock for allowing us  to provide your care today.   Headache: I am checking some labs today to make sure your inflammatory markers are not elevated. I will call with results.  I am worried you are having a rebound headache from your medication. Please stop taking ibuprofen . Follow-up in 2-4 weeks if being off of ibuprofen  does not improve pain. I am checking labs today as ibuprofen  can be hard on your kidneys.  I have ordered the following labs for you:  Lab Orders         BMP8+Anion Gap         Sed Rate (ESR)         CRP (C-Reactive Protein)      I have ordered the following medication/changed the following medications:   Stop the following medications: Medications Discontinued During This Encounter  Medication Reason   LORazepam  (ATIVAN ) 1 MG tablet    LORazepam  (ATIVAN ) 1 MG tablet Completed Course     Start the following medications: No orders of the defined types were placed in this encounter.    Follow up:  4 weeks  We look forward to seeing you next time. Please call our clinic at [phone] if you have any questions or concerns. The best time to call is Monday-Friday from 9am-4pm, but there is someone available 24/7. If after hours or the weekend, call the main hospital number and ask for the Internal Medicine Resident On-Call. If you need medication refills, please notify your pharmacy one week in advance and they will send us  a request.   Thank you for trusting me with your care. Wishing you the best!   Izetta Medley, DO [facility]

## 2023-11-12 ENCOUNTER — Other Ambulatory Visit (HOSPITAL_COMMUNITY): Payer: Self-pay

## 2023-11-12 ENCOUNTER — Other Ambulatory Visit: Payer: Self-pay

## 2023-11-12 LAB — BMP8+ANION GAP
Anion Gap: 13 mmol/L (ref 10.0–18.0)
BUN/Creatinine Ratio: 17 (ref 10–24)
BUN: 19 mg/dL (ref 8–27)
CO2: 26 mmol/L (ref 20–29)
Calcium: 9.6 mg/dL (ref 8.6–10.2)
Chloride: 102 mmol/L (ref 96–106)
Creatinine, Ser: 1.11 mg/dL (ref 0.76–1.27)
Glucose: 77 mg/dL (ref 70–99)
Potassium: 4.4 mmol/L (ref 3.5–5.2)
Sodium: 141 mmol/L (ref 134–144)
eGFR: 74 mL/min/{1.73_m2} (ref >59)

## 2023-11-12 LAB — C-REACTIVE PROTEIN

## 2023-11-12 LAB — SEDIMENTATION RATE: Sed Rate: 2 mm/h (ref 0–30)

## 2023-11-12 NOTE — Progress Notes (Signed)
 Internal Medicine Clinic Attending  Case discussed with the resident physician at the time of the visit.  We reviewed the patient's history, exam, and pertinent patient test results.  I agree with the assessment, diagnosis, and plan of care documented in the resident's note.

## 2023-11-13 ENCOUNTER — Other Ambulatory Visit (HOSPITAL_COMMUNITY): Payer: Self-pay

## 2023-11-13 ENCOUNTER — Other Ambulatory Visit: Payer: Self-pay

## 2023-11-13 DIAGNOSIS — B20 Human immunodeficiency virus [HIV] disease: Secondary | ICD-10-CM

## 2023-11-13 DIAGNOSIS — Z113 Encounter for screening for infections with a predominantly sexual mode of transmission: Secondary | ICD-10-CM

## 2023-11-14 ENCOUNTER — Other Ambulatory Visit: Payer: Self-pay

## 2023-11-14 ENCOUNTER — Other Ambulatory Visit: Payer: Commercial Managed Care - PPO

## 2023-11-14 DIAGNOSIS — Z113 Encounter for screening for infections with a predominantly sexual mode of transmission: Secondary | ICD-10-CM | POA: Diagnosis not present

## 2023-11-14 DIAGNOSIS — B20 Human immunodeficiency virus [HIV] disease: Secondary | ICD-10-CM | POA: Diagnosis not present

## 2023-11-15 LAB — T-HELPER CELL (CD4) - (RCID CLINIC ONLY)
CD4 % Helper T Cell: 42 % (ref 33–65)
CD4 T Cell Abs: 591 /uL (ref 400–1790)

## 2023-11-16 ENCOUNTER — Other Ambulatory Visit (HOSPITAL_COMMUNITY): Payer: Self-pay

## 2023-11-17 LAB — CBC WITH DIFFERENTIAL/PLATELET
Absolute Lymphocytes: 1575 {cells}/uL (ref 850–3900)
Absolute Monocytes: 315 {cells}/uL (ref 200–950)
Basophils Absolute: 41 {cells}/uL (ref 0–200)
Basophils Relative: 0.9 %
Eosinophils Absolute: 59 {cells}/uL (ref 15–500)
Eosinophils Relative: 1.3 %
HCT: 49.9 % (ref 38.5–50.0)
Hemoglobin: 16.1 g/dL (ref 13.2–17.1)
MCH: 27.3 pg (ref 27.0–33.0)
MCHC: 32.3 g/dL (ref 32.0–36.0)
MCV: 84.7 fL (ref 80.0–100.0)
MPV: 9.4 fL (ref 7.5–12.5)
Monocytes Relative: 7 %
Neutro Abs: 2511 {cells}/uL (ref 1500–7800)
Neutrophils Relative %: 55.8 %
Platelets: 206 10*3/uL (ref 140–400)
RBC: 5.89 10*6/uL — ABNORMAL HIGH (ref 4.20–5.80)
RDW: 13.1 % (ref 11.0–15.0)
Total Lymphocyte: 35 %
WBC: 4.5 10*3/uL (ref 3.8–10.8)

## 2023-11-17 LAB — COMPLETE METABOLIC PANEL WITH GFR
AG Ratio: 2.1 (calc) (ref 1.0–2.5)
ALT: 74 U/L — ABNORMAL HIGH (ref 9–46)
AST: 41 U/L — ABNORMAL HIGH (ref 10–35)
Albumin: 5.2 g/dL — ABNORMAL HIGH (ref 3.6–5.1)
Alkaline phosphatase (APISO): 105 U/L (ref 35–144)
BUN: 20 mg/dL (ref 7–25)
CO2: 33 mmol/L — ABNORMAL HIGH (ref 20–32)
Calcium: 9.9 mg/dL (ref 8.6–10.3)
Chloride: 98 mmol/L (ref 98–110)
Creat: 0.99 mg/dL (ref 0.70–1.35)
Globulin: 2.5 g/dL (ref 1.9–3.7)
Glucose, Bld: 120 mg/dL — ABNORMAL HIGH (ref 65–99)
Potassium: 3.7 mmol/L (ref 3.5–5.3)
Sodium: 140 mmol/L (ref 135–146)
Total Bilirubin: 0.6 mg/dL (ref 0.2–1.2)
Total Protein: 7.7 g/dL (ref 6.1–8.1)
eGFR: 85 mL/min/{1.73_m2} (ref >=60)

## 2023-11-17 LAB — HIV-1 RNA QUANT-NO REFLEX-BLD
HIV 1 RNA Quant: NOT DETECTED {copies}/mL
HIV-1 RNA Quant, Log: NOT DETECTED {Log}

## 2023-11-17 LAB — RPR: RPR Ser Ql: NONREACTIVE

## 2023-11-19 ENCOUNTER — Telehealth: Payer: Self-pay

## 2023-11-19 NOTE — Telephone Encounter (Signed)
-----   Message from Paulette Blanch Dam sent at 11/17/2023  5:24 PM EST ----- Thayer Ohm his transaminases are up to be have alcohol at all the night before the labs were drawn? ----- Message ----- From: Interface, [facility] Lab Results In Sent: 11/14/2023   3:07 PM EST To: Randall Hiss, MD

## 2023-11-21 NOTE — Telephone Encounter (Signed)
Spoke with Trevor Fischer, he states he did not have any alcohol the night before labs. Asked about acetaminophen use. He states he takes 200 mg 3-4 times a day for arthritis pain.   Sandie Ano, RN

## 2023-11-23 ENCOUNTER — Other Ambulatory Visit (HOSPITAL_COMMUNITY): Payer: Self-pay

## 2023-11-26 ENCOUNTER — Other Ambulatory Visit (HOSPITAL_COMMUNITY): Payer: Self-pay

## 2023-11-26 ENCOUNTER — Other Ambulatory Visit: Payer: Self-pay

## 2023-11-27 NOTE — Progress Notes (Unsigned)
 Subjective:  Chief complaint: follow-up for HIV disease on medications   Patient ID: Trevor Fischer, male    DOB: 25-Apr-1961, 63 y.o.   MRN: [id number]  HPI  Discussed the use of AI scribe software for clinical note transcription with the patient, who gave verbal consent to proceed.  History of Present Illness   The patient, with a history of HIV, blood clots, high cholesterol, diabetes, muscle spasms, enlarged prostate, heartburn, and high blood pressure, presents for a follow-up visit. The patient's recent liver function tests showed elevated levels, which was a new finding. The patient denies any significant alcohol consumption or illicit drug use. The patient has been taking Tylenol for pain management, but the dosage has been reduced recently. The patient also takes insulin for diabetes and is on a regimen of Biktarvy for HIV management. . The patient also takes iron supplements and has been advised to take them after meals to avoid interactions with Biktarvy.       Past Medical History:  Diagnosis Date   Allergic rhinitis    Degenerative joint disease of knee    DVT (deep venous thrombosis) (HCC) RLE X 2   Ended anticoagulation in 2012   Flatulence 05/13/2015   GERD (gastroesophageal reflux disease)    History of DVT of lower extremity 10/08   HIV infection (HCC)    Hx MRSA infection    Hyperlipidemia    Hyperlipidemia 11/17/2021   Hypertension    Pulmonary embolism (HCC) 05/14/2017   Superficial thrombophlebitis    Type II diabetes mellitus (HCC) 9/09    Past Surgical History:  Procedure Laterality Date   ENDOVENOUS ABLATION SAPHENOUS VEIN W/ LASER  07-17-2011 LEFT GRERATER SAPHENOUS VEIN AND STAB PHLEBECTOMIES   10-20   LEFT LEG   ENDOVENOUS ABLATION SAPHENOUS VEIN W/ LASER Left 08/09/2022   endovenous laser ablation left small saphenous vein and stab phlebectomy 10-20 incisions left leg by Cari Caraway MD   VEIN LIGATION AND STRIPPING Bilateral     Family  History  Problem Relation Age of Onset   Diabetes Father    Kidney disease Father    Heart failure Father    Hyperlipidemia Father    Hypertension Father    Osteoarthritis Mother       Social History   Socioeconomic History   Marital status: Married    Spouse name: Not on file   Number of children: Not on file   Years of education: Not on file   Highest education level: Not on file  Occupational History   Not on file  Tobacco Use   Smoking status: Former    Current packs/day: 0.00    Average packs/day: 1 pack/day for 28.0 years (28.0 ttl pk-yrs)    Types: Cigarettes    Start date: 07/02/1977    Quit date: 07/02/2005    Years since quitting: 18.4   Smokeless tobacco: Never  Vaping Use   Vaping status: Never Used  Substance and Sexual Activity   Alcohol use: Yes    Alcohol/week: 0.0 standard drinks of alcohol    Comment: 05/14/2017 "1-2 drinks/year"   Drug use: No   Sexual activity: Not Currently    Partners: Male    Comment: declined condoms  Other Topics Concern   Not on file  Social History Narrative   Lives in [place], with partner of 26 years    Works as Psychologist, sport and exercise at [employer]   Has [employer]   Social Drivers of Health  Financial Resource Strain: Not on file  Food Insecurity: No Food Insecurity (08/07/2022)   Hunger Vital Sign    Worried About Running Out of Food in the Last Year: Never true    Ran Out of Food in the Last Year: Never true  Transportation Needs: No Transportation Needs (05/14/2023)   PRAPARE - Administrator, Civil Service (Medical): No    Lack of Transportation (Non-Medical): No  Physical Activity: Not on file  Stress: Not on file  Social Connections: Not on file    Allergies  Allergen Reactions   Famotidine Other (See Comments)    Co-administration will lower complera levels   Amoxicillin-Pot Clavulanate Other (See Comments)   Amoxicillin-Pot Clavulanate Rash   Metformin And Related Diarrhea      Current Outpatient Medications:    Accu-Chek FastClix Lancets MISC, USE TO CHECK BLOOD SUGAR UP TO 4 TIMES A DAY, Disp: 102 each, Rfl: PRN   ACCU-CHEK GUIDE test strip, USE TO CHECK BLOOD SUGAR UP TO 4 TIMES A DAY, Disp: 400 strip, Rfl: PRN   acetaminophen (TYLENOL) 500 MG tablet, Take 2 tablets (1,000 mg total) by mouth 2 (two) times daily., Disp: 30 tablet, Rfl: 3   Alcohol Swabs (EASY TOUCH ALCOHOL PREP MEDIUM) 70 % PADS, Use as directed up to 10 daily, Disp: 1000 each, Rfl: 3   apixaban (ELIQUIS) 5 MG TABS tablet, Take 1 tablet (5 mg total) by mouth 2 (two) times daily., Disp: 180 tablet, Rfl: 2   atorvastatin (LIPITOR) 40 MG tablet, Take 1 tablet (40 mg total) by mouth daily., Disp: 90 tablet, Rfl: 3   baclofen (LIORESAL) 20 MG tablet, Take 1 tablet (20 mg total) by mouth 3 (three) times daily., Disp: 90 tablet, Rfl: 2   Blood Glucose Monitoring Suppl (FREESTYLE LITE) DEVI, , Disp: , Rfl:    Continuous Glucose Sensor (DEXCOM G7 SENSOR) MISC, Use as directed to check blood sugar every 10 days., Disp: 4 each, Rfl: 3   Continuous Glucose Sensor (DEXCOM G7 SENSOR) MISC, change every 10 days, Disp: 9 each, Rfl: 3   COVID-19 mRNA Vac-TriS, Pfizer, (PFIZER-BIONT COVID-19 VAC-TRIS) SUSP injection, Inject into the muscle., Disp: 0.3 mL, Rfl: 0   empagliflozin (JARDIANCE) 25 MG TABS tablet, Take 1 tablet (25 mg total) by mouth daily before breakfast., Disp: 90 tablet, Rfl: 3   FREESTYLE LITE test strip, , Disp: , Rfl:    gabapentin (NEURONTIN) 800 MG tablet, Take 1 tablet (800 mg total) by mouth 3 (three) times daily., Disp: 270 tablet, Rfl: 3   insulin degludec (TRESIBA FLEXTOUCH) 100 UNIT/ML FlexTouch Pen, Inject 8 Units into the skin at bedtime., Disp: 3 mL, Rfl: 2   Insulin Lispro-aabc (LYUMJEV KWIKPEN) 100 UNIT/ML KwikPen, Inject 10 Units into the skin 4 (four) times daily -  with meals and at bedtime., Disp: 12 mL, Rfl: 2   Insulin Pen Needle (PEN NEEDLES) 31G X 5 MM MISC, Use daily as  directed., Disp: 100 each, Rfl: 1   Insulin Pen Needle 32G X 4 MM MISC, Use as directed 6 times daily, Disp: 100 each, Rfl: 8   iron polysaccharides (FERREX 150) 150 MG capsule, Take 1 capsule (150 mg total) by mouth daily., Disp: 90 capsule, Rfl: 0   Lancets (FREESTYLE) lancets, USE TO CHECK BLOOD SUGAR UP TO 4 TIMES A DAY, Disp: 100 each, Rfl: 99   losartan (COZAAR) 50 MG tablet, Take 1 tablet (50 mg total) by mouth daily., Disp: 90 tablet, Rfl: 3  Omega-3 Fatty Acids (FISH OIL) 1000 MG CAPS, Take 1 capsule by mouth 2 (two) times daily., Disp: , Rfl:    pantoprazole (PROTONIX) 40 MG tablet, Take 1 tablet (40 mg total) by mouth daily., Disp: 90 tablet, Rfl: 3   sildenafil (VIAGRA) 50 MG tablet, Take 1 tablet (50 mg total) by mouth daily as needed for erectile dysfunction., Disp: 30 tablet, Rfl: 0   silodosin (RAPAFLO) 8 MG CAPS capsule, Take 1 capsule (8 mg total) by mouth daily., Disp: 30 capsule, Rfl: 11   Simethicone 180 MG CAPS, Take 1 capsule (180 mg total) by mouth 3 (three) times daily as needed., Disp: 60 capsule, Rfl: 3   tadalafil (CIALIS) 5 MG tablet, Take 1 tablet (5 mg total) by mouth daily., Disp: 30 tablet, Rfl: 11   tirzepatide (MOUNJARO) 10 MG/0.5ML Pen, Inject 10 mg into the skin once a week., Disp: 2 mL, Rfl: 3   triamterene-hydrochlorothiazide (MAXZIDE) 75-50 MG tablet, Take 1/2 tablet by mouth daily., Disp: 45 tablet, Rfl: 3   verapamil (CALAN-SR) 240 MG CR tablet, Take 1 tablet (240 mg total) by mouth daily., Disp: 90 tablet, Rfl: 3   bictegravir-emtricitabine-tenofovir AF (BIKTARVY) 50-200-25 MG TABS tablet, TAKE 1 TABLET BY MOUTH DAILY., Disp: 30 tablet, Rfl: 11   COVID-19 mRNA vaccine 2023-2024 (COMIRNATY) syringe, Inject into the muscle. (Patient not taking: Reported on 11/28/2023), Disp: 0.3 mL, Rfl: 0   tamsulosin (FLOMAX) 0.4 MG CAPS capsule, Take 1 capsule (0.4 mg total) by mouth daily., Disp: 30 capsule, Rfl: 11   Tdap (BOOSTRIX) 5-2.5-18.5 LF-MCG/0.5 injection,  Inject 0.5 mLs into the muscle. (Patient not taking: Reported on 11/28/2023), Disp: 0.5 mL, Rfl: 0   Review of Systems     Objective:   Physical Exam        Assessment & Plan:   Assessment and Plan    Elevated Liver Function Tests: AST and ALT were elevated at 41 and 74 respectively. Patient has been taking Tylenol regularly, but the dose is unlikely to cause liver injury. Patient has lost significant weight, which may have previously contributed to fatty liver disease, but this is less likely given current weight loss. -Repeat liver function tests. -If still elevated, order an ultrasound of the liver to further evaluate and Hepatology referral --will check for Hepatitis C, A though former is especially unlikely  HIV: Viral load is undetectable and CD4 1200  on current regimen of Biktarvy. -Continue Biktarvy as prescribed.  Diabetes: Patient is on Jardiance and insulin,  Monjauro -Continue current regimen.  Hyperlipidemia: continue atorvastatin,    Muscle spasms:  continue baclofen,    GERD continue pantoprazole,  [id number] losartan, triamterene hydrochlorothiazide, verapamil,  g.  Vaccinations: Patient has received flu and COVID vaccines. -Recommend RSV vaccine.      Hypercoagable Statel he is continuing on Eliquis

## 2023-11-28 ENCOUNTER — Encounter: Payer: Self-pay | Admitting: Infectious Disease

## 2023-11-28 ENCOUNTER — Other Ambulatory Visit: Payer: Self-pay

## 2023-11-28 ENCOUNTER — Ambulatory Visit: Payer: Commercial Managed Care - PPO | Admitting: Infectious Disease

## 2023-11-28 VITALS — BP 124/73 | HR 74 | Resp 16 | Ht 74.0 in | Wt 178.4 lb

## 2023-11-28 DIAGNOSIS — Z794 Long term (current) use of insulin: Secondary | ICD-10-CM

## 2023-11-28 DIAGNOSIS — D6859 Other primary thrombophilia: Secondary | ICD-10-CM | POA: Diagnosis not present

## 2023-11-28 DIAGNOSIS — E1140 Type 2 diabetes mellitus with diabetic neuropathy, unspecified: Secondary | ICD-10-CM

## 2023-11-28 DIAGNOSIS — R7401 Elevation of levels of liver transaminase levels: Secondary | ICD-10-CM

## 2023-11-28 DIAGNOSIS — M62838 Other muscle spasm: Secondary | ICD-10-CM

## 2023-11-28 DIAGNOSIS — K219 Gastro-esophageal reflux disease without esophagitis: Secondary | ICD-10-CM

## 2023-11-28 DIAGNOSIS — I10 Essential (primary) hypertension: Secondary | ICD-10-CM | POA: Diagnosis not present

## 2023-11-28 DIAGNOSIS — B20 Human immunodeficiency virus [HIV] disease: Secondary | ICD-10-CM | POA: Diagnosis not present

## 2023-11-28 MED ORDER — BICTEGRAVIR-EMTRICITAB-TENOFOV 50-200-25 MG PO TABS
1.0000 | ORAL_TABLET | Freq: Every day | ORAL | 11 refills | Status: DC
  Filled 2023-11-28 – 2023-11-30 (×2): qty 30, 30d supply, fill #0
  Filled 2024-01-03: qty 30, 30d supply, fill #1
  Filled 2024-01-24: qty 30, 30d supply, fill #2
  Filled 2024-02-27: qty 30, 30d supply, fill #3
  Filled 2024-03-24: qty 30, 30d supply, fill #4
  Filled 2024-04-28: qty 30, 30d supply, fill #5
  Filled 2024-05-29: qty 30, 30d supply, fill #6
  Filled 2024-06-27: qty 30, 30d supply, fill #7
  Filled 2024-07-25 – 2024-07-28 (×3): qty 30, 30d supply, fill #8
  Filled 2024-09-09: qty 30, 30d supply, fill #9

## 2023-11-29 ENCOUNTER — Other Ambulatory Visit (HOSPITAL_COMMUNITY): Payer: Self-pay

## 2023-11-29 LAB — HEPATIC FUNCTION PANEL
AG Ratio: 1.8 (calc) (ref 1.0–2.5)
ALT: 86 U/L — ABNORMAL HIGH (ref 9–46)
AST: 40 U/L — ABNORMAL HIGH (ref 10–35)
Albumin: 4.8 g/dL (ref 3.6–5.1)
Alkaline phosphatase (APISO): 108 U/L (ref 35–144)
Bilirubin, Direct: 0.2 mg/dL (ref 0.0–0.2)
Globulin: 2.7 g/dL (ref 1.9–3.7)
Indirect Bilirubin: 0.6 mg/dL (ref 0.2–1.2)
Total Bilirubin: 0.8 mg/dL (ref 0.2–1.2)
Total Protein: 7.5 g/dL (ref 6.1–8.1)

## 2023-11-29 LAB — HEPATITIS A ANTIBODY, TOTAL: Hepatitis A AB,Total: REACTIVE — AB

## 2023-11-29 LAB — HEPATITIS C AB W/RFL RNA, PCR + GENO: Hepatitis C Ab: NONREACTIVE

## 2023-11-30 ENCOUNTER — Other Ambulatory Visit (HOSPITAL_COMMUNITY): Payer: Self-pay

## 2023-11-30 ENCOUNTER — Other Ambulatory Visit: Payer: Self-pay | Admitting: Infectious Disease

## 2023-11-30 ENCOUNTER — Other Ambulatory Visit: Payer: Self-pay

## 2023-11-30 ENCOUNTER — Telehealth: Payer: Self-pay

## 2023-11-30 DIAGNOSIS — R7401 Elevation of levels of liver transaminase levels: Secondary | ICD-10-CM

## 2023-11-30 NOTE — Telephone Encounter (Signed)
-----   Message from Purty Rock sent at 11/30/2023 10:04 AM EST ----- Trevor Fischer still has fairly minor elevations in his AST, ALT. I am ordering US liver. We can see what that shows but if it shows nothing can refer to GI if he would like an opinion. I think that we probbably do not need to worry too much because the elevations are fairly minor but I can offer him GI referral after [name] is done _-or before ----- Message ----- From: Interface, [facility] Lab Results In Sent: 11/29/2023  12:14 AM EST To: Randall Hiss, MD

## 2023-11-30 NOTE — Progress Notes (Signed)
 Specialty Pharmacy Refill Coordination Note  Trevor Fischer is a 63 y.o. male contacted today regarding refills of specialty medication(s) Bictegravir-Emtricitab-Tenofov [name])   Patient requested Delivery   Delivery date: 12/07/23   Verified address: [address]  [place] [address]   Medication will be filled on 12/06/23.

## 2023-11-30 NOTE — Telephone Encounter (Signed)
 Patient aware. Patient agreeable to [name] and will wait for referral coordinator to call him with appointment information. Patient prefers to go to [facility] if its covered by insurance Armed forces operational officer).   Cash Duce P Kirstina Leinweber, CMA   Linnie Mcglocklin [employer], CMA

## 2023-12-06 ENCOUNTER — Other Ambulatory Visit: Payer: Self-pay

## 2023-12-13 ENCOUNTER — Ambulatory Visit
Admission: RE | Admit: 2023-12-13 | Discharge: 2023-12-13 | Disposition: A | Discharge status: Home / Self Care | Source: Ambulatory Visit | Attending: Infectious Disease | Admitting: Infectious Disease

## 2023-12-13 DIAGNOSIS — K759 Inflammatory liver disease, unspecified: Secondary | ICD-10-CM | POA: Diagnosis not present

## 2023-12-13 DIAGNOSIS — R7401 Elevation of levels of liver transaminase levels: Secondary | ICD-10-CM

## 2023-12-17 ENCOUNTER — Other Ambulatory Visit: Payer: Self-pay | Admitting: Student

## 2023-12-17 ENCOUNTER — Other Ambulatory Visit (HOSPITAL_COMMUNITY): Payer: Self-pay

## 2023-12-17 DIAGNOSIS — D6859 Other primary thrombophilia: Secondary | ICD-10-CM

## 2023-12-17 MED ORDER — APIXABAN 5 MG PO TABS
5.0000 mg | ORAL_TABLET | Freq: Two times a day (BID) | ORAL | 2 refills | Status: DC
  Filled 2023-12-17 – 2023-12-28 (×2): qty 180, 90d supply, fill #0
  Filled 2024-03-27: qty 180, 90d supply, fill #1
  Filled 2024-06-25: qty 180, 90d supply, fill #2

## 2023-12-18 ENCOUNTER — Other Ambulatory Visit (HOSPITAL_COMMUNITY): Payer: Self-pay

## 2023-12-20 ENCOUNTER — Other Ambulatory Visit (HOSPITAL_COMMUNITY): Payer: Self-pay

## 2023-12-28 ENCOUNTER — Other Ambulatory Visit (HOSPITAL_COMMUNITY): Payer: Self-pay

## 2023-12-28 ENCOUNTER — Other Ambulatory Visit: Payer: Self-pay

## 2024-01-03 ENCOUNTER — Other Ambulatory Visit: Payer: Self-pay

## 2024-01-03 NOTE — Progress Notes (Signed)
 Specialty Pharmacy Refill Coordination Note  Trevor Fischer is a 63 y.o. male contacted today regarding refills of specialty medication(s) Bictegravir-Emtricitab-Tenofov [name])   Patient requested Delivery   Delivery date: 01/08/24   Verified address: [address]  [place] [address]   Medication will be filled on 01/07/24.

## 2024-01-07 ENCOUNTER — Other Ambulatory Visit: Payer: Self-pay

## 2024-01-10 ENCOUNTER — Other Ambulatory Visit (HOSPITAL_COMMUNITY): Payer: Self-pay

## 2024-01-10 ENCOUNTER — Other Ambulatory Visit: Payer: Self-pay

## 2024-01-11 ENCOUNTER — Other Ambulatory Visit (HOSPITAL_COMMUNITY): Payer: Self-pay

## 2024-01-14 ENCOUNTER — Other Ambulatory Visit: Payer: Self-pay | Admitting: Student

## 2024-01-14 DIAGNOSIS — E1140 Type 2 diabetes mellitus with diabetic neuropathy, unspecified: Secondary | ICD-10-CM

## 2024-01-15 ENCOUNTER — Other Ambulatory Visit: Payer: Self-pay

## 2024-01-15 ENCOUNTER — Other Ambulatory Visit (HOSPITAL_COMMUNITY): Payer: Self-pay

## 2024-01-15 MED ORDER — MOUNJARO 10 MG/0.5ML ~~LOC~~ SOAJ
10.0000 mg | SUBCUTANEOUS | 3 refills | Status: DC
  Filled 2024-01-15: qty 2, 28d supply, fill #0
  Filled 2024-02-19: qty 2, 28d supply, fill #1
  Filled 2024-03-16: qty 2, 28d supply, fill #2

## 2024-01-15 NOTE — Telephone Encounter (Signed)
 Medication sent to pharmacy

## 2024-01-21 ENCOUNTER — Other Ambulatory Visit (HOSPITAL_COMMUNITY): Payer: Self-pay

## 2024-01-21 ENCOUNTER — Other Ambulatory Visit: Payer: Self-pay | Admitting: Student

## 2024-01-21 DIAGNOSIS — E1140 Type 2 diabetes mellitus with diabetic neuropathy, unspecified: Secondary | ICD-10-CM

## 2024-01-23 ENCOUNTER — Other Ambulatory Visit (HOSPITAL_COMMUNITY): Payer: Self-pay

## 2024-01-23 ENCOUNTER — Other Ambulatory Visit: Payer: Self-pay

## 2024-01-23 MED ORDER — TRESIBA FLEXTOUCH 100 UNIT/ML ~~LOC~~ SOPN
8.0000 [IU] | PEN_INJECTOR | Freq: Every day | SUBCUTANEOUS | 2 refills | Status: DC
  Filled 2024-01-23: qty 3, 37d supply, fill #0
  Filled 2024-02-25: qty 3, 37d supply, fill #1
  Filled 2024-04-04: qty 3, 37d supply, fill #2

## 2024-01-24 ENCOUNTER — Other Ambulatory Visit: Payer: Self-pay

## 2024-01-24 ENCOUNTER — Other Ambulatory Visit: Payer: Self-pay | Admitting: Pharmacy Technician

## 2024-01-24 NOTE — Progress Notes (Signed)
 Specialty Pharmacy Refill Coordination Note  Trevor Fischer is a 63 y.o. male contacted today regarding refills of specialty medication(s) Bictegravir-Emtricitab-Tenofov (BIKTARVY )  Spoke with Husband Danny  Patient requested Delivery   Delivery date: 02/07/24   Verified address: [address]  [place]   Medication will be filled on 02/06/24.

## 2024-02-01 ENCOUNTER — Other Ambulatory Visit (HOSPITAL_COMMUNITY): Payer: Self-pay

## 2024-02-01 ENCOUNTER — Other Ambulatory Visit: Payer: Self-pay | Admitting: Student

## 2024-02-01 DIAGNOSIS — E1140 Type 2 diabetes mellitus with diabetic neuropathy, unspecified: Secondary | ICD-10-CM

## 2024-02-01 MED ORDER — EMPAGLIFLOZIN 25 MG PO TABS
25.0000 mg | ORAL_TABLET | Freq: Every day | ORAL | 3 refills | Status: AC
  Filled 2024-02-01: qty 90, 90d supply, fill #0
  Filled 2024-05-08: qty 90, 90d supply, fill #1
  Filled 2024-08-13: qty 90, 90d supply, fill #2

## 2024-02-01 NOTE — Telephone Encounter (Signed)
 Medication sent to pharmacy

## 2024-02-04 ENCOUNTER — Other Ambulatory Visit: Payer: Self-pay

## 2024-02-06 ENCOUNTER — Ambulatory Visit (HOSPITAL_COMMUNITY)
Admission: RE | Admit: 2024-02-06 | Discharge: 2024-02-06 | Disposition: A | Discharge status: Home / Self Care | Source: Ambulatory Visit | Attending: Family Medicine | Admitting: Family Medicine

## 2024-02-06 ENCOUNTER — Other Ambulatory Visit: Payer: Self-pay

## 2024-02-06 ENCOUNTER — Encounter: Payer: Self-pay | Admitting: Internal Medicine

## 2024-02-06 ENCOUNTER — Ambulatory Visit: Payer: Self-pay | Admitting: Internal Medicine

## 2024-02-06 VITALS — BP 120/60 | HR 58 | Temp 98.7°F | Ht 74.0 in | Wt 173.3 lb

## 2024-02-06 DIAGNOSIS — Z7984 Long term (current) use of oral hypoglycemic drugs: Secondary | ICD-10-CM | POA: Diagnosis not present

## 2024-02-06 DIAGNOSIS — Z794 Long term (current) use of insulin: Secondary | ICD-10-CM

## 2024-02-06 DIAGNOSIS — I10 Essential (primary) hypertension: Secondary | ICD-10-CM

## 2024-02-06 DIAGNOSIS — R001 Bradycardia, unspecified: Secondary | ICD-10-CM | POA: Insufficient documentation

## 2024-02-06 DIAGNOSIS — D509 Iron deficiency anemia, unspecified: Secondary | ICD-10-CM | POA: Diagnosis not present

## 2024-02-06 DIAGNOSIS — Z7985 Long-term (current) use of injectable non-insulin antidiabetic drugs: Secondary | ICD-10-CM | POA: Diagnosis not present

## 2024-02-06 DIAGNOSIS — E785 Hyperlipidemia, unspecified: Secondary | ICD-10-CM

## 2024-02-06 DIAGNOSIS — E1140 Type 2 diabetes mellitus with diabetic neuropathy, unspecified: Secondary | ICD-10-CM

## 2024-02-06 DIAGNOSIS — B20 Human immunodeficiency virus [HIV] disease: Secondary | ICD-10-CM

## 2024-02-06 LAB — GLUCOSE, CAPILLARY: Glucose-Capillary: 120 mg/dL — ABNORMAL HIGH (ref 70–99)

## 2024-02-06 LAB — POCT GLYCOSYLATED HEMOGLOBIN (HGB A1C): Hemoglobin A1C: 6.8 % — AB (ref 4.0–5.6)

## 2024-02-06 NOTE — Progress Notes (Signed)
 CC: follow up  HPI:  [name] is a 63 y.o. with medical history of HTN, HLD, DMII presenting to [facility] for follow up on chronic conditions. Last seen in 09/2023 by PCP and instructed to follow up in 3 months.   Please see problem-based list for further details, assessments, and plans.  Past Medical History:  Diagnosis Date   Allergic rhinitis    Degenerative joint disease of knee    DVT (deep venous thrombosis) (HCC) RLE X 2   Ended anticoagulation in 2012   Flatulence 05/13/2015   Flatulence 05/13/2015   GERD (gastroesophageal reflux disease)    Headache 11/09/2023   History of DVT of lower extremity 07/2007   HIV infection (HCC)    Hx MRSA infection    Hyperlipidemia    Hyperlipidemia 11/17/2021   Hypertension    Lumbar radiculopathy, right 12/10/2017   Memory changes 11/17/2022   Pulmonary embolism (HCC) 05/14/2017   PVC (premature ventricular contraction) 06/18/2020   Superficial thrombophlebitis    Transaminitis 10/14/2014   Type II diabetes mellitus (HCC) 06/2008    Current Outpatient Medications (Endocrine & Metabolic):    empagliflozin  (JARDIANCE ) 25 MG TABS tablet, Take 1 tablet (25 mg total) by mouth daily before breakfast.   insulin  degludec (TRESIBA  FLEXTOUCH) 100 UNIT/ML FlexTouch Pen, Inject 8 Units into the skin at bedtime.   Insulin  Lispro-aabc (LYUMJEV  KWIKPEN) 100 UNIT/ML KwikPen, Inject 10 Units into the skin 4 (four) times daily -  with meals and at bedtime.   tirzepatide  (MOUNJARO ) 10 MG/0.5ML Pen, Inject 10 mg into the skin once a week.  Current Outpatient Medications (Cardiovascular):    atorvastatin  (LIPITOR) 40 MG tablet, Take 1 tablet (40 mg total) by mouth daily.   losartan  (COZAAR ) 50 MG tablet, Take 1 tablet (50 mg total) by mouth daily.   sildenafil  (VIAGRA ) 50 MG tablet, Take 1 tablet (50 mg total) by mouth daily as needed for erectile dysfunction.   tadalafil  (CIALIS ) 5 MG tablet, Take 1 tablet (5 mg total) by mouth daily.    triamterene -hydrochlorothiazide  (MAXZIDE ) 75-50 MG tablet, Take 1/2 tablet by mouth daily.   Current Outpatient Medications (Analgesics):    acetaminophen  (TYLENOL ) 500 MG tablet, Take 2 tablets (1,000 mg total) by mouth 2 (two) times daily.  Current Outpatient Medications (Hematological):    apixaban  (ELIQUIS ) 5 MG TABS tablet, Take 1 tablet (5 mg total) by mouth 2 (two) times daily.  Current Outpatient Medications (Other):    Accu-Chek FastClix Lancets MISC, USE TO CHECK BLOOD SUGAR UP TO 4 TIMES A DAY   ACCU-CHEK GUIDE test strip, USE TO CHECK BLOOD SUGAR UP TO 4 TIMES A DAY   Alcohol  Swabs  (EASY TOUCH ALCOHOL  PREP MEDIUM) 70 % PADS, Use as directed up to 10 daily   baclofen  (LIORESAL ) 20 MG tablet, Take 1 tablet (20 mg total) by mouth 3 (three) times daily.   bictegravir-emtricitabine -tenofovir  AF (BIKTARVY ) 50-200-25 MG TABS tablet, TAKE 1 TABLET BY MOUTH DAILY.   Blood Glucose Monitoring Suppl (FREESTYLE LITE) DEVI,    Continuous Glucose Sensor (DEXCOM G7 SENSOR) MISC, Use as directed to check blood sugar every 10 days.   Continuous Glucose Sensor (DEXCOM G7 SENSOR) MISC, change every 10 days   COVID-19 mRNA Vac-TriS, Pfizer, (PFIZER-BIONT COVID-19 VAC-TRIS) SUSP injection, Inject into the muscle.   COVID-19 mRNA vaccine 2023-2024 (COMIRNATY ) syringe, Inject into the muscle. (Patient not taking: Reported on 11/28/2023)   FREESTYLE LITE test strip,    gabapentin  (NEURONTIN ) 800 MG tablet, Take 1 tablet (  800 mg total) by mouth 3 (three) times daily.   Insulin  Pen Needle (PEN NEEDLES) 31G X 5 MM MISC, Use daily as directed.   Insulin  Pen Needle 32G X 4 MM MISC, Use as directed 6 times daily   Lancets (FREESTYLE) lancets, USE TO CHECK BLOOD SUGAR UP TO 4 TIMES A DAY   Omega-3 Fatty Acids (FISH OIL) 1000 MG CAPS, Take 1 capsule by mouth 2 (two) times daily.   pantoprazole  (PROTONIX ) 40 MG tablet, Take 1 tablet (40 mg total) by mouth daily.   silodosin  (RAPAFLO ) 8 MG CAPS capsule, Take 1  capsule (8 mg total) by mouth daily.   Simethicone  180 MG CAPS, Take 1 capsule (180 mg total) by mouth 3 (three) times daily as needed.   tamsulosin  (FLOMAX ) 0.4 MG CAPS capsule, Take 1 capsule (0.4 mg total) by mouth daily.   Tdap (BOOSTRIX ) 5-2.5-18.5 LF-MCG/0.5 injection, Inject 0.5 mLs into the muscle. (Patient not taking: Reported on 11/28/2023)  Review of Systems:  Review of system negative unless stated in the problem list or HPI.    Physical Exam:  Vitals:   02/06/24 0847  BP: 120/60  Pulse: (!) 58  Temp: 98.7 F (37.1 C)  TempSrc: Oral  Weight: 173 lb 4.8 oz (78.6 kg)  Height: 6\' 2"  (1.88 m)   Physical Exam General: NAD HENT: NCAT Lungs: CTAB, no wheeze, rhonchi or rales.  Cardiovascular: Normal heart sounds, no r/m/g, 2+ pulses in all extremities. No LE edema Abdomen: No TTP, normal bowel sounds MSK: No asymmetry or muscle atrophy.  Skin: no lesions noted on exposed skin Neuro: Alert and oriented x4. CN grossly intact Psych: Normal mood and normal affect   Assessment & Plan:   Hypertension Pt with HTN who is on Losartan  50 mg every day, Triamterene -hydrochlorothiazide  75-50 taking half tablet daily and verapamil  240 mg daily. He is unsure the exact indication for verapamil  but states he has been on it for around 20 years. His blood pressure is well controlled and he is having some lower extremity edema, we will stop verapamil  to see how his blood pressure does. Pt was also bradycardic which is another reason to stop CCB without any indication. EKG was obtained that showed bradycardia without heart block but no acute abnormalities. Some ST changes were present but they are unchanged from prior.   Diabetes mellitus with neuropathy (HCC) Pt with DMII. Repeat A1c this 6.8. Previous A1c was 6.7. Regimen is insulin  8 units long acting and 5 units QID. Mounjaro  10 mg weekly and Jardiance  25 mg daily. Pt reports breaking up his long acting insulin  to few doses. Advised him to  use long acting at one time as it will not result in hypoglycemia at his dose. At subsequent visits, we can decrease his mealtime as allowed and increase his basal if needed. Would keep his GLP1 at current dose given his BMI.   Human immunodeficiency virus (HIV) disease (HCC) On Biktarvy . Last labwork 3 months ago with undectable disease and CD4 count >500. Continue current therapy.   Hyperlipidemia On lipitor 40 mg every day. Last LDL 75. Repeat this visit.   Iron  deficiency anemia Pt with normal hgb and normal iron  studies. Will stop pt's iron  supplementation. Will monitor hgb at subsequent visits.    See Encounters Tab for problem based charting.  Patient Discussed with Dr. Starling Eck, MD Tommas Fragmin. [facility] Internal Medicine Residency, PGY-3

## 2024-02-06 NOTE — Assessment & Plan Note (Signed)
 Pt with HTN who is on Losartan  50 mg every day, Triamterene -hydrochlorothiazide  75-50 taking half tablet daily and verapamil  240 mg daily. He is unsure the exact indication for verapamil  but states he has been on it for around 20 years. His blood pressure is well controlled and he is having some lower extremity edema, we will stop verapamil  to see how his blood pressure does. Pt was also bradycardic which is another reason to stop CCB without any indication. EKG was obtained that showed bradycardia without heart block but no acute abnormalities. Some ST changes were present but they are unchanged from prior.

## 2024-02-06 NOTE — Assessment & Plan Note (Addendum)
 Pt with DMII. Repeat A1c this 6.8. Previous A1c was 6.7. Regimen is insulin  8 units long acting and 5 units QID. Mounjaro  10 mg weekly and Jardiance  25 mg daily. Pt reports breaking up his long acting insulin  to few doses. Advised him to use long acting at one time as it will not result in hypoglycemia at his dose. At subsequent visits, we can decrease his mealtime as allowed and increase his basal if needed. Would keep his GLP1 at current dose given his BMI.

## 2024-02-06 NOTE — Assessment & Plan Note (Signed)
 On lipitor 40 mg every day. Last LDL 75. Repeat this visit.

## 2024-02-06 NOTE — Assessment & Plan Note (Signed)
 Pt with normal hgb and normal iron  studies. Will stop pt's iron  supplementation. Will monitor hgb at subsequent visits.

## 2024-02-06 NOTE — Assessment & Plan Note (Signed)
 On Biktarvy . Last labwork 3 months ago with undectable disease and CD4 count >500. Continue current therapy.

## 2024-02-06 NOTE — Patient Instructions (Signed)
 [name], it was a pleasure seeing you today! You endorsed feeling well today. Below are some of the things we talked about this visit. We look forward to seeing you in the follow up appointment!  Today we discussed: We are going to stop your verapamil . Continue your other blood pressure medications.   Take your long acting insulin  Treshiba 8 units one time daily. Use your short acting with meals as you are.   We are also going to stop your iron  tablets.   I have ordered the following labs today:   Lab Orders         Glucose, capillary         Lipid Profile         POC Hbg A1C       Referrals ordered today:   Referral Orders  No referral(s) requested today     I have ordered the following medication/changed the following medications:   Stop the following medications: Medications Discontinued During This Encounter  Medication Reason   verapamil  (CALAN -SR) 240 MG CR tablet Discontinued by provider   iron  polysaccharides (FERREX 150) 150 MG capsule Discontinued by provider     Start the following medications: No orders of the defined types were placed in this encounter.    Follow-up: 3 month follow up   Please make sure to arrive 15 minutes prior to your next appointment. If you arrive late, you may be asked to reschedule.   We look forward to seeing you next time. Please call our clinic at [phone] if you have any questions or concerns. The best time to call is Monday-Friday from 9am-4pm, but there is someone available 24/7. If after hours or the weekend, call the main hospital number and ask for the Internal Medicine Resident On-Call. If you need medication refills, please notify your pharmacy one week in advance and they will send us  a request.  Thank you for letting us  take part in your care. Wishing you the best!  Thank you, Jackolyn Masker, MD

## 2024-02-07 LAB — LIPID PANEL
Chol/HDL Ratio: 3 ratio (ref 0.0–5.0)
Cholesterol, Total: 128 mg/dL (ref 100–199)
HDL: 43 mg/dL (ref >39)
LDL Chol Calc (NIH): 69 mg/dL (ref 0–99)
Triglycerides: 79 mg/dL (ref 0–149)
VLDL Cholesterol Cal: 16 mg/dL (ref 5–40)

## 2024-02-11 DIAGNOSIS — R972 Elevated prostate specific antigen [PSA]: Secondary | ICD-10-CM | POA: Diagnosis not present

## 2024-02-11 NOTE — Progress Notes (Signed)
 Internal Medicine Clinic Attending  Case discussed with the resident at the time of the visit.  We reviewed the resident's history and exam and pertinent patient test results.  I agree with the assessment, diagnosis, and plan of care documented in the resident's note.

## 2024-02-12 ENCOUNTER — Other Ambulatory Visit: Payer: Self-pay | Admitting: Student

## 2024-02-12 ENCOUNTER — Other Ambulatory Visit (HOSPITAL_COMMUNITY): Payer: Self-pay

## 2024-02-12 DIAGNOSIS — G8929 Other chronic pain: Secondary | ICD-10-CM

## 2024-02-12 MED ORDER — BACLOFEN 20 MG PO TABS
20.0000 mg | ORAL_TABLET | Freq: Three times a day (TID) | ORAL | 2 refills | Status: DC
  Filled 2024-02-12: qty 90, 30d supply, fill #0
  Filled 2024-03-19: qty 90, 30d supply, fill #1
  Filled 2024-04-14: qty 90, 30d supply, fill #2

## 2024-02-14 ENCOUNTER — Other Ambulatory Visit: Payer: Self-pay | Admitting: Student

## 2024-02-14 ENCOUNTER — Other Ambulatory Visit: Payer: Self-pay

## 2024-02-14 DIAGNOSIS — K219 Gastro-esophageal reflux disease without esophagitis: Secondary | ICD-10-CM

## 2024-02-14 NOTE — Progress Notes (Signed)
The ASCVD Risk score (Arnett DK, et al., 2019) failed to calculate for the following reasons:   The valid total cholesterol range is 130 to 320 mg/dL  

## 2024-02-15 ENCOUNTER — Other Ambulatory Visit (HOSPITAL_COMMUNITY): Payer: Self-pay

## 2024-02-18 ENCOUNTER — Other Ambulatory Visit (HOSPITAL_COMMUNITY): Payer: Self-pay

## 2024-02-18 DIAGNOSIS — R972 Elevated prostate specific antigen [PSA]: Secondary | ICD-10-CM | POA: Diagnosis not present

## 2024-02-18 DIAGNOSIS — N401 Enlarged prostate with lower urinary tract symptoms: Secondary | ICD-10-CM | POA: Diagnosis not present

## 2024-02-18 DIAGNOSIS — N5201 Erectile dysfunction due to arterial insufficiency: Secondary | ICD-10-CM | POA: Diagnosis not present

## 2024-02-18 DIAGNOSIS — R351 Nocturia: Secondary | ICD-10-CM | POA: Diagnosis not present

## 2024-02-18 MED ORDER — SILODOSIN 8 MG PO CAPS
8.0000 mg | ORAL_CAPSULE | Freq: Every day | ORAL | 3 refills | Status: AC
  Filled 2024-03-19: qty 90, 90d supply, fill #0
  Filled 2024-06-14: qty 90, 90d supply, fill #1
  Filled 2024-09-10: qty 90, 90d supply, fill #2

## 2024-02-18 MED ORDER — TADALAFIL 5 MG PO TABS: 5.0000 mg | ORAL_TABLET | Freq: Every day | ORAL | 3 refills | Status: DC

## 2024-02-18 MED ORDER — PANTOPRAZOLE SODIUM 40 MG PO TBEC
40.0000 mg | DELAYED_RELEASE_TABLET | Freq: Every day | ORAL | 3 refills | Status: DC
  Filled 2024-02-18: qty 90, 90d supply, fill #0
  Filled 2024-05-17: qty 90, 90d supply, fill #1

## 2024-02-18 NOTE — Telephone Encounter (Signed)
 Medication sent to pharmacy

## 2024-02-19 ENCOUNTER — Other Ambulatory Visit (HOSPITAL_COMMUNITY): Payer: Self-pay

## 2024-02-19 ENCOUNTER — Other Ambulatory Visit: Payer: Self-pay | Admitting: Student

## 2024-02-19 DIAGNOSIS — K219 Gastro-esophageal reflux disease without esophagitis: Secondary | ICD-10-CM

## 2024-02-19 NOTE — Telephone Encounter (Unsigned)
 Copied from CRM [id number]. Topic: Clinical - Medication Refill >> Feb 19, 2024 10:21 AM Carrielelia G wrote: Medication:  pantoprazole  (PROTONIX ) 40 MG tablet   Has the patient contacted their pharmacy? Yes (Agent: If no, request that the patient contact the pharmacy for the refill. If patient does not wish to contact the pharmacy document the reason why and proceed with request.) (Agent: If yes, when and what did the pharmacy advise?)  This is the patient's preferred pharmacy:  [facility] [address] [place] [address] Phone: [phone] Fax: [phone]   Is this the correct pharmacy for this prescription? Yes If no, delete pharmacy and type the correct one.   Has the prescription been filled recently? Yes  Is the patient out of the medication? No ( 2 days left)   Has the patient been seen for an appointment in the last year OR does the patient have an upcoming appointment? Yes  Can we respond through MyChart? Yes  Agent: Please be advised that Rx refills may take up to 3 business days. We ask that you follow-up with your pharmacy.

## 2024-02-26 ENCOUNTER — Other Ambulatory Visit (HOSPITAL_COMMUNITY): Payer: Self-pay

## 2024-02-27 ENCOUNTER — Other Ambulatory Visit: Payer: Self-pay

## 2024-02-27 NOTE — Progress Notes (Signed)
 Specialty Pharmacy Refill Coordination Note  Trevor Fischer is a 63 y.o. male contacted today regarding refills of specialty medication(s) Bictegravir-Emtricitab-Tenofov (BIKTARVY )   Patient requested Delivery   Delivery date: 03/06/24   Verified address: [address]  [place]   Medication will be filled on 03/05/24.

## 2024-02-27 NOTE — Progress Notes (Signed)
 Specialty Pharmacy Ongoing Clinical Assessment Note  Trevor Fischer is a 63 y.o. male who is being followed by the specialty pharmacy service for RxSp HIV   Patient's specialty medication(s) reviewed today: Bictegravir-Emtricitab-Tenofov (BIKTARVY )   Missed doses in the last 4 weeks: 0   Patient/Caregiver did not have any additional questions or concerns.   Therapeutic benefit summary: Patient is achieving benefit   Adverse events/side effects summary: No adverse events/side effects   Patient's therapy is appropriate to: Continue    Goals Addressed             This Visit's Progress    Achieve Undetectable HIV Viral Load < 20   On track    Patient is on track. Patient will maintain adherence. Patient's viral load remains undetectable long term         Follow up: 6 months  [facility]

## 2024-03-06 ENCOUNTER — Other Ambulatory Visit: Payer: Self-pay | Admitting: Student

## 2024-03-06 DIAGNOSIS — E1140 Type 2 diabetes mellitus with diabetic neuropathy, unspecified: Secondary | ICD-10-CM

## 2024-03-07 ENCOUNTER — Other Ambulatory Visit: Payer: Self-pay

## 2024-03-07 ENCOUNTER — Other Ambulatory Visit (HOSPITAL_COMMUNITY): Payer: Self-pay

## 2024-03-07 MED ORDER — GABAPENTIN 800 MG PO TABS
800.0000 mg | ORAL_TABLET | Freq: Three times a day (TID) | ORAL | 3 refills | Status: AC
  Filled 2024-03-07 – 2024-03-17 (×2): qty 270, 90d supply, fill #0
  Filled 2024-06-14: qty 270, 90d supply, fill #1
  Filled 2024-09-10: qty 270, 90d supply, fill #2

## 2024-03-11 ENCOUNTER — Other Ambulatory Visit (HOSPITAL_BASED_OUTPATIENT_CLINIC_OR_DEPARTMENT_OTHER): Payer: Self-pay

## 2024-03-14 ENCOUNTER — Telehealth: Payer: Self-pay | Admitting: Student

## 2024-03-14 NOTE — Telephone Encounter (Signed)
 Please refer to message below.  Called patient, but no answer.  Appointment has been scheduled for 05/26/24 at 9:15 am with Dr. Cathey Clunes.  Detailed message left informing patient of future appointment date and time, to call back if appointment needs to be rescheduled.    Copied from CRM [id number]. Topic: General - Other >> Mar 14, 2024  9:20 AM Retta Caster wrote: Reason for CRM: Patient needs app for follow up from Dr, Meredeth Stallion only from 02/06/24. Needs call back due to no dates available [phone]

## 2024-03-17 ENCOUNTER — Other Ambulatory Visit: Payer: Self-pay

## 2024-03-17 ENCOUNTER — Other Ambulatory Visit (HOSPITAL_COMMUNITY): Payer: Self-pay

## 2024-03-19 ENCOUNTER — Other Ambulatory Visit (HOSPITAL_COMMUNITY): Payer: Self-pay

## 2024-03-24 ENCOUNTER — Other Ambulatory Visit: Payer: Self-pay

## 2024-03-24 NOTE — Progress Notes (Signed)
 Specialty Pharmacy Refill Coordination Note  Trevor Fischer is a 63 y.o. male contacted today regarding refills of specialty medication(s) Bictegravir-Emtricitab-Tenofov (BIKTARVY )   Patient requested Delivery   Delivery date: 04/02/24   Verified address: [address]  [place]   Medication will be filled on 04/01/24.

## 2024-03-27 ENCOUNTER — Other Ambulatory Visit: Payer: Self-pay | Admitting: Student

## 2024-03-27 ENCOUNTER — Other Ambulatory Visit: Payer: Self-pay

## 2024-03-27 ENCOUNTER — Other Ambulatory Visit (HOSPITAL_COMMUNITY): Payer: Self-pay

## 2024-03-27 DIAGNOSIS — I10 Essential (primary) hypertension: Secondary | ICD-10-CM

## 2024-03-27 MED ORDER — LOSARTAN POTASSIUM 50 MG PO TABS
50.0000 mg | ORAL_TABLET | Freq: Every day | ORAL | 1 refills | Status: DC
  Filled 2024-03-27: qty 90, 90d supply, fill #0
  Filled 2024-06-30: qty 90, 90d supply, fill #1

## 2024-03-28 ENCOUNTER — Other Ambulatory Visit (HOSPITAL_COMMUNITY): Payer: Self-pay

## 2024-04-04 ENCOUNTER — Other Ambulatory Visit: Payer: Self-pay | Admitting: Student

## 2024-04-04 DIAGNOSIS — E1140 Type 2 diabetes mellitus with diabetic neuropathy, unspecified: Secondary | ICD-10-CM

## 2024-04-05 ENCOUNTER — Other Ambulatory Visit (HOSPITAL_COMMUNITY): Payer: Self-pay

## 2024-04-07 ENCOUNTER — Other Ambulatory Visit (HOSPITAL_COMMUNITY): Payer: Self-pay

## 2024-04-07 ENCOUNTER — Other Ambulatory Visit: Payer: Self-pay

## 2024-04-07 MED ORDER — INSUPEN PEN NEEDLES 32G X 4 MM MISC
1.0000 | Freq: Every day | 8 refills | Status: AC
  Filled 2024-04-07: qty 500, 83d supply, fill #0
  Filled 2024-09-10: qty 400, 67d supply, fill #1

## 2024-04-11 ENCOUNTER — Telehealth: Payer: Self-pay | Admitting: Student

## 2024-04-11 ENCOUNTER — Other Ambulatory Visit: Payer: Self-pay

## 2024-04-11 NOTE — Telephone Encounter (Signed)
 Patient called this morning asking for me by now.  I was unavailable at the time of the call.  Returned patient's phone call, but no answer.  Left detailed message to call me back at [phone].  I will be here in the office today until 12 pm.

## 2024-04-13 ENCOUNTER — Encounter: Payer: Self-pay | Admitting: Student

## 2024-04-14 ENCOUNTER — Other Ambulatory Visit: Payer: Self-pay

## 2024-04-14 ENCOUNTER — Other Ambulatory Visit: Payer: Self-pay | Admitting: Student

## 2024-04-14 DIAGNOSIS — I10 Essential (primary) hypertension: Secondary | ICD-10-CM

## 2024-04-15 ENCOUNTER — Other Ambulatory Visit (HOSPITAL_COMMUNITY): Payer: Self-pay

## 2024-04-15 MED ORDER — TRIAMTERENE-HCTZ 75-50 MG PO TABS
0.5000 | ORAL_TABLET | Freq: Every day | ORAL | 1 refills | Status: DC
  Filled 2024-04-15: qty 45, 90d supply, fill #0
  Filled 2024-07-12: qty 45, 90d supply, fill #1

## 2024-04-16 ENCOUNTER — Other Ambulatory Visit: Payer: Self-pay

## 2024-04-16 ENCOUNTER — Other Ambulatory Visit (HOSPITAL_COMMUNITY): Payer: Self-pay

## 2024-04-16 DIAGNOSIS — E1140 Type 2 diabetes mellitus with diabetic neuropathy, unspecified: Secondary | ICD-10-CM

## 2024-04-16 MED ORDER — MOUNJARO 10 MG/0.5ML ~~LOC~~ SOAJ
7.5000 mg | SUBCUTANEOUS | 3 refills | Status: DC
  Filled 2024-04-16: qty 2, 35d supply, fill #0

## 2024-04-16 MED ORDER — MOUNJARO 7.5 MG/0.5ML ~~LOC~~ SOAJ
7.5000 mg | SUBCUTANEOUS | 3 refills | Status: DC
  Filled 2024-04-16: qty 2, 28d supply, fill #0
  Filled 2024-05-12: qty 2, 28d supply, fill #1

## 2024-04-18 ENCOUNTER — Encounter: Payer: Self-pay | Admitting: Advanced Practice Midwife

## 2024-04-27 ENCOUNTER — Other Ambulatory Visit: Payer: Self-pay | Admitting: Student

## 2024-04-27 DIAGNOSIS — E1140 Type 2 diabetes mellitus with diabetic neuropathy, unspecified: Secondary | ICD-10-CM

## 2024-04-28 ENCOUNTER — Other Ambulatory Visit (HOSPITAL_COMMUNITY): Payer: Self-pay

## 2024-04-28 ENCOUNTER — Other Ambulatory Visit: Payer: Self-pay | Admitting: Pharmacy Technician

## 2024-04-28 ENCOUNTER — Other Ambulatory Visit: Payer: Self-pay

## 2024-04-28 MED ORDER — ATORVASTATIN CALCIUM 40 MG PO TABS
40.0000 mg | ORAL_TABLET | Freq: Every day | ORAL | 3 refills | Status: AC
  Filled 2024-04-28: qty 90, 90d supply, fill #0
  Filled 2024-07-28: qty 90, 90d supply, fill #1

## 2024-04-28 NOTE — Telephone Encounter (Signed)
 Medication sent to pharmacy

## 2024-04-28 NOTE — Progress Notes (Signed)
 Specialty Pharmacy Refill Coordination Note  Trevor Fischer is a 63 y.o. male contacted today regarding refills of specialty medication(s) Bictegravir-Emtricitab-Tenofov (BIKTARVY )   Patient requested Delivery   Delivery date: 05/06/24   Verified address: [address] [place]   Medication will be filled on 05/05/24.

## 2024-04-29 ENCOUNTER — Ambulatory Visit: Payer: Self-pay | Admitting: Student

## 2024-04-29 VITALS — BP 115/64 | HR 64 | Temp 98.1°F | Ht 74.0 in | Wt 168.0 lb

## 2024-04-29 DIAGNOSIS — Z794 Long term (current) use of insulin: Secondary | ICD-10-CM | POA: Diagnosis not present

## 2024-04-29 DIAGNOSIS — B20 Human immunodeficiency virus [HIV] disease: Secondary | ICD-10-CM

## 2024-04-29 DIAGNOSIS — R7401 Elevation of levels of liver transaminase levels: Secondary | ICD-10-CM | POA: Diagnosis not present

## 2024-04-29 DIAGNOSIS — I10 Essential (primary) hypertension: Secondary | ICD-10-CM

## 2024-04-29 DIAGNOSIS — E1140 Type 2 diabetes mellitus with diabetic neuropathy, unspecified: Secondary | ICD-10-CM | POA: Diagnosis not present

## 2024-04-29 DIAGNOSIS — Z7985 Long-term (current) use of injectable non-insulin antidiabetic drugs: Secondary | ICD-10-CM | POA: Diagnosis not present

## 2024-04-29 NOTE — Progress Notes (Unsigned)
 CC: Follow-up  HPI:  Mr.Torre L Raether is a 63 y.o. male living with a history stated below and presents today for follow-up. Please see problem based assessment and plan for additional details.  Past Medical History:  Diagnosis Date   Allergic rhinitis    Degenerative joint disease of knee    DVT (deep venous thrombosis) (HCC) RLE X 2   Ended anticoagulation in 2012   Flatulence 05/13/2015   Flatulence 05/13/2015   GERD (gastroesophageal reflux disease)    Headache 11/09/2023   History of DVT of lower extremity 07/2007   HIV infection (HCC)    Hx MRSA infection    Hyperlipidemia    Hyperlipidemia 11/17/2021   Hypertension    Lumbar radiculopathy, right 12/10/2017   Memory changes 11/17/2022   Pulmonary embolism (HCC) 05/14/2017   PVC (premature ventricular contraction) 06/18/2020   Superficial thrombophlebitis    Transaminitis 10/14/2014   Type II diabetes mellitus (HCC) 06/2008    Current Outpatient Medications on File Prior to Visit  Medication Sig Dispense Refill   Accu-Chek FastClix Lancets MISC USE TO CHECK BLOOD SUGAR UP TO 4 TIMES A DAY 102 each PRN   ACCU-CHEK GUIDE test strip USE TO CHECK BLOOD SUGAR UP TO 4 TIMES A DAY 400 strip PRN   acetaminophen  (TYLENOL ) 500 MG tablet Take 2 tablets (1,000 mg total) by mouth 2 (two) times daily. 30 tablet 3   Alcohol  Swabs  (EASY TOUCH ALCOHOL  PREP MEDIUM) 70 % PADS Use as directed up to 10 daily 1000 each 3   apixaban  (ELIQUIS ) 5 MG TABS tablet Take 1 tablet (5 mg total) by mouth 2 (two) times daily. 180 tablet 2   atorvastatin  (LIPITOR) 40 MG tablet Take 1 tablet (40 mg total) by mouth daily. 90 tablet 3   baclofen  (LIORESAL ) 20 MG tablet Take 1 tablet (20 mg total) by mouth 3 (three) times daily. 90 tablet 2   bictegravir-emtricitabine -tenofovir  AF (BIKTARVY ) 50-200-25 MG TABS tablet TAKE 1 TABLET BY MOUTH DAILY. 30 tablet 11   Blood Glucose Monitoring Suppl (FREESTYLE LITE) DEVI      Continuous Glucose Sensor  (DEXCOM G7 SENSOR) MISC Use as directed to check blood sugar every 10 days. 4 each 3   Continuous Glucose Sensor (DEXCOM G7 SENSOR) MISC change every 10 days 9 each 3   COVID-19 mRNA Vac-TriS, Pfizer, (PFIZER-BIONT COVID-19 VAC-TRIS) SUSP injection Inject into the muscle. 0.3 mL 0   COVID-19 mRNA vaccine 2023-2024 (COMIRNATY ) syringe Inject into the muscle. (Patient not taking: Reported on 11/28/2023) 0.3 mL 0   empagliflozin  (JARDIANCE ) 25 MG TABS tablet Take 1 tablet (25 mg total) by mouth daily before breakfast. 90 tablet 3   FREESTYLE LITE test strip      gabapentin  (NEURONTIN ) 800 MG tablet Take 1 tablet (800 mg total) by mouth 3 (three) times daily. 270 tablet 3   insulin  degludec (TRESIBA  FLEXTOUCH) 100 UNIT/ML FlexTouch Pen Inject 8 Units into the skin at bedtime. 3 mL 2   Insulin  Lispro-aabc (LYUMJEV  KWIKPEN) 100 UNIT/ML KwikPen Inject 10 Units into the skin 4 (four) times daily -  with meals and at bedtime. 12 mL 2   Insulin  Pen Needle (INSUPEN PEN NEEDLES) 32G X 4 MM MISC Use as directed 6 times daily 100 each 8   Insulin  Pen Needle (PEN NEEDLES) 31G X 5 MM MISC Use daily as directed. 100 each 1   Lancets (FREESTYLE) lancets USE TO CHECK BLOOD SUGAR UP TO 4 TIMES A DAY 100 each 99  losartan  (COZAAR ) 50 MG tablet Take 1 tablet (50 mg total) by mouth daily. 90 tablet 1   Omega-3 Fatty Acids (FISH OIL) 1000 MG CAPS Take 1 capsule by mouth 2 (two) times daily.     pantoprazole  (PROTONIX ) 40 MG tablet Take 1 tablet (40 mg total) by mouth daily. 90 tablet 3   sildenafil  (VIAGRA ) 50 MG tablet Take 1 tablet (50 mg total) by mouth daily as needed for erectile dysfunction. 30 tablet 0   silodosin  (RAPAFLO ) 8 MG CAPS capsule Take 1 capsule (8 mg total) by mouth daily. 90 capsule 3   Simethicone  180 MG CAPS Take 1 capsule (180 mg total) by mouth 3 (three) times daily as needed. 60 capsule 3   tadalafil  (CIALIS ) 5 MG tablet Take 1 tablet (5 mg total) by mouth daily. 30 tablet 11   tadalafil  (CIALIS )  5 MG tablet Take 1 tablet (5 mg total) by mouth daily. 90 tablet 3   tamsulosin  (FLOMAX ) 0.4 MG CAPS capsule Take 1 capsule (0.4 mg total) by mouth daily. 30 capsule 11   Tdap (BOOSTRIX ) 5-2.5-18.5 LF-MCG/0.5 injection Inject 0.5 mLs into the muscle. (Patient not taking: Reported on 11/28/2023) 0.5 mL 0   tirzepatide  (MOUNJARO ) 7.5 MG/0.5ML Pen Inject 7.5 mg into the skin once a week. 2 mL 3   triamterene -hydrochlorothiazide  (MAXZIDE ) 75-50 MG tablet Take 1/2 tablet by mouth daily. 45 tablet 1   No current facility-administered medications on file prior to visit.    Family History  Problem Relation Age of Onset   Diabetes Father    Kidney disease Father    Heart failure Father    Hyperlipidemia Father    Hypertension Father    Osteoarthritis Mother     Social History   Socioeconomic History   Marital status: Married    Spouse name: Not on file   Number of children: Not on file   Years of education: Not on file   Highest education level: Not on file  Occupational History   Not on file  Tobacco Use   Smoking status: Former    Current packs/day: 0.00    Average packs/day: 1 pack/day for 28.0 years (28.0 ttl pk-yrs)    Types: Cigarettes    Start date: 07/02/1977    Quit date: 07/02/2005    Years since quitting: 18.8   Smokeless tobacco: Never  Vaping Use   Vaping status: Never Used  Substance and Sexual Activity   Alcohol  use: Yes    Alcohol /week: 0.0 standard drinks of alcohol     Comment: 05/14/2017 1-2 drinks/year   Drug use: No   Sexual activity: Not Currently    Partners: Male    Comment: declined condoms  Other Topics Concern   Not on file  Social History Narrative   Lives in [place], with partner of 26 years    Works as Psychologist, sport and exercise at [employer]   Has [employer]   Social Drivers of Health   Financial Resource Strain: Low Risk  (04/29/2024)   Overall Financial Resource Strain (CARDIA)    Difficulty of Paying Living Expenses: Not hard at all   Food Insecurity: No Food Insecurity (04/29/2024)   Hunger Vital Sign    Worried About Running Out of Food in the Last Year: Never true    Ran Out of Food in the Last Year: Never true  Transportation Needs: No Transportation Needs (04/29/2024)   PRAPARE - Transportation    Lack of Transportation (Medical): No    Lack of  Transportation (Non-Medical): No  Physical Activity: Sufficiently Active (04/29/2024)   Exercise Vital Sign    Days of Exercise per Week: 5 days    Minutes of Exercise per Session: 60 min  Stress: No Stress Concern Present (04/29/2024)   [employer] of Occupational Health - Occupational Stress Questionnaire    Feeling of Stress: Only a little  Social Connections: Moderately Isolated (04/29/2024)   Social Connection and Isolation Panel    Frequency of Communication with Friends and Family: More than three times a week    Frequency of Social Gatherings with Friends and Family: Once a week    Attends Religious Services: Never    Database administrator or Organizations: No    Attends Banker Meetings: Never    Marital Status: Married  Catering manager Violence: Not At Risk (04/29/2024)   Humiliation, Afraid, Rape, and Kick questionnaire    Fear of Current or Ex-Partner: No    Emotionally Abused: No    Physically Abused: No    Sexually Abused: No    Review of Systems: ROS negative except for what is noted on the assessment and plan.  Vitals:   04/29/24 0914  BP: 115/64  Pulse: 64  Temp: 98.1 F (36.7 C)  TempSrc: Oral  SpO2: 100%  Weight: 168 lb (76.2 kg)  Height: 6' 2 (1.88 m)    Physical Exam: Constitutional: well-appearing, sitting in chair, in no acute distress Cardiovascular: regular rate and rhythm, no m/r/g Pulmonary/Chest: normal work of breathing on room air, lungs clear to auscultation bilaterally MSK: normal bulk and tone Psych: normal mood and behavior  Assessment & Plan:     Patient discussed with Dr. CHARLENA Eastern   Hypertension Normotensive.  Continue Zestoretic 75-50 mg daily and losartan  50 mg daily.  Diabetes mellitus with neuropathy (HCC) A1c today is 6.9.  Managed with Mounjaro  7.5 mg weekly.  He had previously been on 10 mg weekly but reduced dose as his BMI is now 21.6.  Also taking Tresiba  8 units daily, and lispro 4 units 4 times daily.  Continues to focus on diet and exercise.  If A1c continues to be well-controlled at follow-up, can discuss reducing insulin .  Will check CMP and Microalbumin to Creatinine Ratio.   Human immunodeficiency virus (HIV) disease (HCC) Follows with infectious disease (Dr. Fleeta Rothman).  On Biktarvy .  Transaminitis As noted on 11/2023 appointment with Dr. Fleeta Rothman with AST/ALT of 40 and 86 respectively. Hep A and C testing were unremarkable. RUQ was normal. Will re-evaluate with CMP today.    Norman Lobstein, D.O. [facility] Internal Medicine, PGY-2 Phone: [phone] Date 04/30/2024 Time 8:15 AM

## 2024-04-30 ENCOUNTER — Other Ambulatory Visit: Payer: Self-pay

## 2024-04-30 ENCOUNTER — Ambulatory Visit: Payer: Self-pay | Admitting: Student

## 2024-04-30 LAB — COMPREHENSIVE METABOLIC PANEL WITH GFR
ALT: 59 IU/L — ABNORMAL HIGH (ref 0–44)
AST: 36 IU/L (ref 0–40)
Albumin: 4.8 g/dL (ref 3.9–4.9)
Alkaline Phosphatase: 130 IU/L — ABNORMAL HIGH (ref 44–121)
BUN/Creatinine Ratio: 11 (ref 10–24)
BUN: 12 mg/dL (ref 8–27)
Bilirubin Total: 0.6 mg/dL (ref 0.0–1.2)
CO2: 26 mmol/L (ref 20–29)
Calcium: 9.6 mg/dL (ref 8.6–10.2)
Chloride: 99 mmol/L (ref 96–106)
Creatinine, Ser: 1.1 mg/dL (ref 0.76–1.27)
Globulin, Total: 2.5 g/dL (ref 1.5–4.5)
Glucose: 71 mg/dL (ref 70–99)
Potassium: 4.2 mmol/L (ref 3.5–5.2)
Sodium: 142 mmol/L (ref 134–144)
Total Protein: 7.3 g/dL (ref 6.0–8.5)
eGFR: 75 mL/min/1.73 (ref >59)

## 2024-04-30 LAB — MICROALBUMIN / CREATININE URINE RATIO
Creatinine, Urine: 47.4 mg/dL
Microalb/Creat Ratio: <6 mg/g{creat} (ref 0–29)
Microalbumin, Urine: <3 ug/mL

## 2024-04-30 LAB — HEMOGLOBIN A1C
Est. average glucose Bld gHb Est-mCnc: 151 mg/dL
Hgb A1c MFr Bld: 6.9 % — ABNORMAL HIGH (ref 4.8–5.6)

## 2024-04-30 NOTE — Assessment & Plan Note (Addendum)
 A1c today is 6.9.  Managed with Mounjaro  7.5 mg weekly.  He had previously been on 10 mg weekly but reduced dose as his BMI is now 21.6.  Also taking Tresiba  8 units daily, and lispro 4 units 4 times daily.  Continues to focus on diet and exercise.  If A1c continues to be well-controlled at follow-up, can discuss reducing insulin .  Will check CMP and Microalbumin to Creatinine Ratio.

## 2024-04-30 NOTE — Assessment & Plan Note (Signed)
 Normotensive.  Continue Zestoretic 75-50 mg daily and losartan  50 mg daily.

## 2024-04-30 NOTE — Assessment & Plan Note (Signed)
 Follows with infectious disease (Dr. Fleeta Rothman).  On Biktarvy .

## 2024-04-30 NOTE — Assessment & Plan Note (Signed)
 As noted on 11/2023 appointment with Dr. Fleeta Rothman with AST/ALT of 40 and 86 respectively. Hep A and C testing were unremarkable. RUQ was normal. Will re-evaluate with CMP today.

## 2024-05-02 NOTE — Progress Notes (Signed)
 Internal Medicine Clinic Attending  Case discussed with the resident at the time of the visit.  We reviewed the resident's history and exam and pertinent patient test results.  I agree with the assessment, diagnosis, and plan of care documented in the resident's note.

## 2024-05-16 ENCOUNTER — Other Ambulatory Visit: Payer: Self-pay | Admitting: Student

## 2024-05-16 ENCOUNTER — Other Ambulatory Visit (HOSPITAL_COMMUNITY): Payer: Self-pay

## 2024-05-16 DIAGNOSIS — E1140 Type 2 diabetes mellitus with diabetic neuropathy, unspecified: Secondary | ICD-10-CM

## 2024-05-17 ENCOUNTER — Other Ambulatory Visit (HOSPITAL_COMMUNITY): Payer: Self-pay

## 2024-05-17 MED ORDER — TRESIBA FLEXTOUCH 100 UNIT/ML ~~LOC~~ SOPN
8.0000 [IU] | PEN_INJECTOR | Freq: Every day | SUBCUTANEOUS | 2 refills | Status: DC
Start: 2024-05-17 — End: 2024-07-16
  Filled 2024-05-17: qty 3, 37d supply, fill #0
  Filled 2024-06-25: qty 3, 37d supply, fill #1

## 2024-05-19 ENCOUNTER — Other Ambulatory Visit (HOSPITAL_COMMUNITY): Payer: Self-pay

## 2024-05-26 ENCOUNTER — Encounter: Admitting: Student

## 2024-05-28 ENCOUNTER — Other Ambulatory Visit (HOSPITAL_COMMUNITY): Payer: Self-pay

## 2024-05-28 ENCOUNTER — Encounter: Payer: Self-pay | Admitting: Student

## 2024-05-28 ENCOUNTER — Ambulatory Visit: Payer: Self-pay | Admitting: Student

## 2024-05-28 VITALS — BP 122/64 | HR 57 | Temp 98.2°F | Ht 74.0 in | Wt 170.6 lb

## 2024-05-28 DIAGNOSIS — Z86711 Personal history of pulmonary embolism: Secondary | ICD-10-CM | POA: Diagnosis not present

## 2024-05-28 DIAGNOSIS — Z7985 Long-term (current) use of injectable non-insulin antidiabetic drugs: Secondary | ICD-10-CM | POA: Diagnosis not present

## 2024-05-28 DIAGNOSIS — I10 Essential (primary) hypertension: Secondary | ICD-10-CM

## 2024-05-28 DIAGNOSIS — Z7901 Long term (current) use of anticoagulants: Secondary | ICD-10-CM | POA: Diagnosis not present

## 2024-05-28 DIAGNOSIS — E1140 Type 2 diabetes mellitus with diabetic neuropathy, unspecified: Secondary | ICD-10-CM | POA: Diagnosis not present

## 2024-05-28 DIAGNOSIS — Z794 Long term (current) use of insulin: Secondary | ICD-10-CM

## 2024-05-28 DIAGNOSIS — Z0000 Encounter for general adult medical examination without abnormal findings: Secondary | ICD-10-CM

## 2024-05-28 MED ORDER — MOUNJARO 5 MG/0.5ML ~~LOC~~ SOAJ
5.0000 mg | SUBCUTANEOUS | 3 refills | Status: DC
  Filled 2024-05-28: qty 2, 42d supply, fill #0
  Filled 2024-05-29 – 2024-06-04 (×2): qty 2, 28d supply, fill #0
  Filled 2024-06-30: qty 2, 28d supply, fill #1
  Filled 2024-07-27: qty 2, 28d supply, fill #2
  Filled 2024-08-25: qty 2, 28d supply, fill #3

## 2024-05-28 MED ORDER — LYUMJEV KWIKPEN 100 UNIT/ML ~~LOC~~ SOPN
4.0000 [IU] | PEN_INJECTOR | Freq: Three times a day (TID) | SUBCUTANEOUS | 2 refills | Status: DC
  Filled 2024-05-28: qty 12, 75d supply, fill #0

## 2024-05-28 NOTE — Progress Notes (Signed)
 CC: Annual Physical   HPI:  Trevor Fischer is a 63 y.o. male with a past medical history of diabetes, hypertension, GERD, HIV on Biktarvy  coming in for physical exam. Please see assessment and plan for Full HPI.   Past Medical History:  Diagnosis Date   Allergic rhinitis    Degenerative joint disease of knee    DVT (deep venous thrombosis) (HCC) RLE X 2   Ended anticoagulation in 2012   Flatulence 05/13/2015   Flatulence 05/13/2015   GERD (gastroesophageal reflux disease)    Headache 11/09/2023   History of DVT of lower extremity 07/2007   HIV infection (HCC)    Hx MRSA infection    Hyperlipidemia    Hyperlipidemia 11/17/2021   Hypertension    Lumbar radiculopathy, right 12/10/2017   Memory changes 11/17/2022   Pulmonary embolism (HCC) 05/14/2017   PVC (premature ventricular contraction) 06/18/2020   Superficial thrombophlebitis    Transaminitis 10/14/2014   Type II diabetes mellitus (HCC) 06/2008     Current Outpatient Medications:    Accu-Chek FastClix Lancets MISC, USE TO CHECK BLOOD SUGAR UP TO 4 TIMES A DAY, Disp: 102 each, Rfl: PRN   ACCU-CHEK GUIDE test strip, USE TO CHECK BLOOD SUGAR UP TO 4 TIMES A DAY, Disp: 400 strip, Rfl: PRN   acetaminophen  (TYLENOL ) 500 MG tablet, Take 2 tablets (1,000 mg total) by mouth 2 (two) times daily., Disp: 30 tablet, Rfl: 3   Alcohol  Swabs  (EASY TOUCH ALCOHOL  PREP MEDIUM) 70 % PADS, Use as directed up to 10 daily, Disp: 1000 each, Rfl: 3   apixaban  (ELIQUIS ) 5 MG TABS tablet, Take 1 tablet (5 mg total) by mouth 2 (two) times daily., Disp: 180 tablet, Rfl: 2   atorvastatin  (LIPITOR) 40 MG tablet, Take 1 tablet (40 mg total) by mouth daily., Disp: 90 tablet, Rfl: 3   baclofen  (LIORESAL ) 20 MG tablet, Take 1 tablet (20 mg total) by mouth 3 (three) times daily., Disp: 90 tablet, Rfl: 2   bictegravir-emtricitabine -tenofovir  AF (BIKTARVY ) 50-200-25 MG TABS tablet, TAKE 1 TABLET BY MOUTH DAILY., Disp: 30 tablet, Rfl: 11   Blood  Glucose Monitoring Suppl (FREESTYLE LITE) DEVI, , Disp: , Rfl:    Continuous Glucose Sensor (DEXCOM G7 SENSOR) MISC, Use as directed to check blood sugar every 10 days., Disp: 4 each, Rfl: 3   Continuous Glucose Sensor (DEXCOM G7 SENSOR) MISC, change every 10 days, Disp: 9 each, Rfl: 3   COVID-19 mRNA Vac-TriS, Pfizer, (PFIZER-BIONT COVID-19 VAC-TRIS) SUSP injection, Inject into the muscle., Disp: 0.3 mL, Rfl: 0   COVID-19 mRNA vaccine 2023-2024 (COMIRNATY ) syringe, Inject into the muscle. (Patient not taking: Reported on 11/28/2023), Disp: 0.3 mL, Rfl: 0   empagliflozin  (JARDIANCE ) 25 MG TABS tablet, Take 1 tablet (25 mg total) by mouth daily before breakfast., Disp: 90 tablet, Rfl: 3   FREESTYLE LITE test strip, , Disp: , Rfl:    gabapentin  (NEURONTIN ) 800 MG tablet, Take 1 tablet (800 mg total) by mouth 3 (three) times daily., Disp: 270 tablet, Rfl: 3   insulin  degludec (TRESIBA  FLEXTOUCH) 100 UNIT/ML FlexTouch Pen, Inject 8 Units into the skin at bedtime., Disp: 3 mL, Rfl: 2   Insulin  Lispro-aabc (LYUMJEV  KWIKPEN) 100 UNIT/ML KwikPen, Inject 4 Units into the skin 4 (four) times daily -  with meals and at bedtime., Disp: 12 mL, Rfl: 2   Insulin  Pen Needle (INSUPEN PEN NEEDLES) 32G X 4 MM MISC, Use as directed 6 times daily, Disp: 100 each, Rfl: 8  Insulin  Pen Needle (PEN NEEDLES) 31G X 5 MM MISC, Use daily as directed., Disp: 100 each, Rfl: 1   Lancets (FREESTYLE) lancets, USE TO CHECK BLOOD SUGAR UP TO 4 TIMES A DAY, Disp: 100 each, Rfl: 99   losartan  (COZAAR ) 50 MG tablet, Take 1 tablet (50 mg total) by mouth daily., Disp: 90 tablet, Rfl: 1   Omega-3 Fatty Acids (FISH OIL) 1000 MG CAPS, Take 1 capsule by mouth 2 (two) times daily., Disp: , Rfl:    pantoprazole  (PROTONIX ) 40 MG tablet, Take 1 tablet (40 mg total) by mouth daily., Disp: 90 tablet, Rfl: 3   sildenafil  (VIAGRA ) 50 MG tablet, Take 1 tablet (50 mg total) by mouth daily as needed for erectile dysfunction., Disp: 30 tablet, Rfl: 0    silodosin  (RAPAFLO ) 8 MG CAPS capsule, Take 1 capsule (8 mg total) by mouth daily., Disp: 90 capsule, Rfl: 3   Simethicone  180 MG CAPS, Take 1 capsule (180 mg total) by mouth 3 (three) times daily as needed., Disp: 60 capsule, Rfl: 3   tadalafil  (CIALIS ) 5 MG tablet, Take 1 tablet (5 mg total) by mouth daily., Disp: 30 tablet, Rfl: 11   tadalafil  (CIALIS ) 5 MG tablet, Take 1 tablet (5 mg total) by mouth daily., Disp: 90 tablet, Rfl: 3   tamsulosin  (FLOMAX ) 0.4 MG CAPS capsule, Take 1 capsule (0.4 mg total) by mouth daily., Disp: 30 capsule, Rfl: 11   Tdap (BOOSTRIX ) 5-2.5-18.5 LF-MCG/0.5 injection, Inject 0.5 mLs into the muscle. (Patient not taking: Reported on 11/28/2023), Disp: 0.5 mL, Rfl: 0   tirzepatide  (MOUNJARO ) 5 MG/0.5ML Pen, Inject 5 mg into the skin once a week., Disp: 2 mL, Rfl: 3   triamterene -hydrochlorothiazide  (MAXZIDE ) 75-50 MG tablet, Take 1/2 tablet by mouth daily., Disp: 45 tablet, Rfl: 1  Review of Systems:    Constitutional: Negative for fever, night sweats, chills, unintentional weight loss Eye: Negative for any vision changes Respiratory: Negative for shortness of breath and cough Cardiovascular: Negative for chest pain or swelling GI: Negative for abdominal pain, nausea, vomiting, diarrhea MSK: Negative for myalgias GU: Negative for any urinary incontinence, dysuria, hematuria Skin: Negative for any rash Neuro: Negative for any confusion, sensation loss, motor loss Endocrine: Negative for any polyuria or polydipsia  Physical Exam:  Vitals:   05/28/24 0832  BP: 122/64  Pulse: (!) 57  Temp: 98.2 F (36.8 C)  TempSrc: Oral  SpO2: 98%  Weight: 170 lb 9.6 oz (77.4 kg)  Height: 6' 2 (1.88 m)    General: Patient is sitting comfortably in the room  Eyes: Pupils equal and reactive to light, EOM intact  Head: Normocephalic, atraumatic  Ears: Normal TMs and clear ear canals  Cardio: Regular rate and rhythm, no murmurs, rubs or gallops. 2+ pulses to bilateral  upper and lower extremities  Pulmonary: Clear to ausculation bilaterally with no rales, rhonchi, and crackles  Abdomen: Soft, nontender with normoactive bowel sounds with no rebound or guarding    Assessment & Plan:   Annual physical exam Patient endorses he is doing well.  He does not have any concerns today.  Care gaps addressed today.  Foot exam updated today.  Patient to get influenza vaccine at his workplace.  Patient has already obtained a hepatitis B vaccine.  Hypertension Patient is currently normotensive.  Blood pressure today is 122/67.  Plan: - Continue triamterene -HCTZ 75-50 mg half tablet daily - Continue losartan  50 mg daily  Diabetes mellitus with neuropathy (HCC) Most recent A1c 6.9.  Managed well currently.  Patient would like to reduce his dose of Mounjaro  to 5 mg weekly.  Otherwise he states he is doing well.  He is currently taking Tresiba  80 units daily, lispro 4 units 3 times daily.  He states he is doing well.  He has no concerns today.  Did have a low blood sugar today into the 70s in the room today.  Gave glucose tablets.  He states he really does not get any lows.  His meter is very sensitive, and sometimes alerts him when he is getting low, but he never feels symptomatic.  He states he is doing well from a insulin  standpoint.  If he does get too well-controlled, can continue to titrate down insulin .  Plan: - Decrease tirzepatide  to 5 mg weekly - Continue lispro 4 units 3 times daily - Continue Tresiba  8 units nightly -A1c in 2 months  History of pulmonary embolism Past medical history of PE.  On Eliquis .  No acute concerns.  Plan: - Continue Eliquis  5 mg twice daily  Patient discussed with Dr. Karna Libby Blanch, DO Internal Medicine Resident PGY-3

## 2024-05-28 NOTE — Patient Instructions (Signed)
 Trevor Fischer,Thank you for allowing me to take part in your care today.  Here are your instructions.  1.  I have decreased your Mounjaro  to 5 mg weekly.  Continue doing your other insulin  as directed.  You should be doing long-acting [name]) insulin  8 units daily and short acting (lispro) 4 units with meals.  Also continue taking your Jardiance .  2.  Come back in 6 months for A1c check.   PLEASE BRING YOUR MEDICATIONS TO EVERY APPOINTMENT  Thank you, Dr. Tobie  If you have any other questions please contact the internal medicine clinic at [phone] If it is after hours, please call the [facility] at [phone] and then ask the person who picks up for the resident on call.

## 2024-05-28 NOTE — Assessment & Plan Note (Signed)
 Patient is currently normotensive.  Blood pressure today is 122/67.  Plan: - Continue triamterene -HCTZ 75-50 mg half tablet daily - Continue losartan  50 mg daily

## 2024-05-28 NOTE — Assessment & Plan Note (Signed)
 Patient endorses he is doing well.  He does not have any concerns today.  Care gaps addressed today.  Foot exam updated today.  Patient to get influenza vaccine at his workplace.  Patient has already obtained a hepatitis B vaccine.

## 2024-05-28 NOTE — Assessment & Plan Note (Signed)
 Past medical history of PE.  On Eliquis .  No acute concerns.  Plan: - Continue Eliquis  5 mg twice daily

## 2024-05-28 NOTE — Assessment & Plan Note (Addendum)
 Most recent A1c 6.9.  Managed well currently.  Patient would like to reduce his dose of Mounjaro  to 5 mg weekly.  Otherwise he states he is doing well.  He is currently taking Tresiba  80 units daily, lispro 4 units 3 times daily.  He states he is doing well.  He has no concerns today.  Did have a low blood sugar today into the 70s in the room today.  Gave glucose tablets.  He states he really does not get any lows.  His meter is very sensitive, and sometimes alerts him when he is getting low, but he never feels symptomatic.  He states he is doing well from a insulin  standpoint.  If he does get too well-controlled, can continue to titrate down insulin .  Plan: - Decrease tirzepatide  to 5 mg weekly - Continue lispro 4 units 3 times daily - Continue Tresiba  8 units nightly -A1c in 2 months

## 2024-05-29 ENCOUNTER — Other Ambulatory Visit (HOSPITAL_COMMUNITY): Payer: Self-pay

## 2024-05-30 ENCOUNTER — Other Ambulatory Visit: Payer: Self-pay

## 2024-05-30 ENCOUNTER — Other Ambulatory Visit (HOSPITAL_COMMUNITY): Payer: Self-pay

## 2024-05-30 NOTE — Addendum Note (Signed)
 Addended by: KARNA FELLOWS on: 05/30/2024 10:49 AM   Modules accepted: Level of Service

## 2024-05-30 NOTE — Progress Notes (Signed)
 Internal Medicine Clinic Attending  Case discussed with the resident at the time of the visit.  We reviewed the resident's history and exam and pertinent patient test results.  I agree with the assessment, diagnosis, and plan of care documented in the resident's note.

## 2024-05-30 NOTE — Progress Notes (Signed)
 Specialty Pharmacy Refill Coordination Note  Trevor Fischer is a 63 y.o. male contacted today regarding refills of specialty medication(s) Bictegravir-Emtricitab-Tenofov (BIKTARVY )   Patient requested Delivery   Delivery date: 06/05/24   Verified address: [address]  [place]   Medication will be filled on 06/04/24.

## 2024-06-04 ENCOUNTER — Other Ambulatory Visit (HOSPITAL_COMMUNITY): Payer: Self-pay

## 2024-06-14 ENCOUNTER — Other Ambulatory Visit: Payer: Self-pay | Admitting: Student

## 2024-06-14 DIAGNOSIS — G8929 Other chronic pain: Secondary | ICD-10-CM

## 2024-06-16 ENCOUNTER — Other Ambulatory Visit (HOSPITAL_COMMUNITY): Payer: Self-pay

## 2024-06-16 ENCOUNTER — Other Ambulatory Visit: Payer: Self-pay

## 2024-06-16 MED ORDER — BACLOFEN 20 MG PO TABS
20.0000 mg | ORAL_TABLET | Freq: Three times a day (TID) | ORAL | 2 refills | Status: DC
  Filled 2024-06-16: qty 90, 30d supply, fill #0
  Filled 2024-07-12: qty 90, 30d supply, fill #1
  Filled 2024-08-13: qty 90, 30d supply, fill #2

## 2024-06-25 ENCOUNTER — Other Ambulatory Visit: Payer: Self-pay

## 2024-06-25 ENCOUNTER — Other Ambulatory Visit (HOSPITAL_COMMUNITY): Payer: Self-pay

## 2024-06-26 ENCOUNTER — Other Ambulatory Visit: Payer: Self-pay

## 2024-06-27 ENCOUNTER — Other Ambulatory Visit (HOSPITAL_COMMUNITY): Payer: Self-pay

## 2024-06-27 ENCOUNTER — Other Ambulatory Visit: Payer: Self-pay

## 2024-06-27 ENCOUNTER — Other Ambulatory Visit: Payer: Self-pay | Admitting: Student

## 2024-06-27 DIAGNOSIS — E1140 Type 2 diabetes mellitus with diabetic neuropathy, unspecified: Secondary | ICD-10-CM

## 2024-06-27 MED ORDER — DEXCOM G7 SENSOR MISC
1.0000 "application " | 3 refills | Status: AC
  Filled 2024-06-27: qty 4, 40d supply, fill #0
  Filled 2024-09-21 – 2024-11-02 (×5): qty 4, 40d supply, fill #1
  Filled 2024-11-04: qty 3, 30d supply, fill #1

## 2024-06-30 ENCOUNTER — Other Ambulatory Visit: Payer: Self-pay

## 2024-06-30 ENCOUNTER — Other Ambulatory Visit: Payer: Self-pay | Admitting: Pharmacy Technician

## 2024-06-30 NOTE — Progress Notes (Signed)
 Specialty Pharmacy Refill Coordination Note  Trevor Fischer is a 63 y.o. male contacted today regarding refills of specialty medication(s) Bictegravir-Emtricitab-Tenofov (BIKTARVY )  Spoke with Husband [name].  Patient requested Delivery   Delivery date: 07/04/24   Verified address: [address] [place]   Medication will be filled on 07/03/24.

## 2024-07-02 ENCOUNTER — Other Ambulatory Visit: Payer: Self-pay

## 2024-07-16 ENCOUNTER — Other Ambulatory Visit (HOSPITAL_COMMUNITY): Payer: Self-pay

## 2024-07-16 ENCOUNTER — Ambulatory Visit: Payer: Self-pay

## 2024-07-16 VITALS — BP 125/70 | HR 60 | Temp 97.7°F | Ht 74.0 in | Wt 173.6 lb

## 2024-07-16 DIAGNOSIS — Z7985 Long-term (current) use of injectable non-insulin antidiabetic drugs: Secondary | ICD-10-CM

## 2024-07-16 DIAGNOSIS — Z79899 Other long term (current) drug therapy: Secondary | ICD-10-CM | POA: Diagnosis not present

## 2024-07-16 DIAGNOSIS — Z833 Family history of diabetes mellitus: Secondary | ICD-10-CM

## 2024-07-16 DIAGNOSIS — K219 Gastro-esophageal reflux disease without esophagitis: Secondary | ICD-10-CM

## 2024-07-16 DIAGNOSIS — Z794 Long term (current) use of insulin: Secondary | ICD-10-CM | POA: Diagnosis not present

## 2024-07-16 DIAGNOSIS — Z87891 Personal history of nicotine dependence: Secondary | ICD-10-CM

## 2024-07-16 DIAGNOSIS — E1140 Type 2 diabetes mellitus with diabetic neuropathy, unspecified: Secondary | ICD-10-CM | POA: Diagnosis not present

## 2024-07-16 DIAGNOSIS — D6859 Other primary thrombophilia: Secondary | ICD-10-CM

## 2024-07-16 LAB — POCT GLYCOSYLATED HEMOGLOBIN (HGB A1C): HbA1c, POC (controlled diabetic range): 7 % (ref 0.0–7.0)

## 2024-07-16 LAB — GLUCOSE, CAPILLARY: Glucose-Capillary: 154 mg/dL — ABNORMAL HIGH (ref 70–99)

## 2024-07-16 MED ORDER — LYUMJEV KWIKPEN 100 UNIT/ML ~~LOC~~ SOPN
3.0000 [IU] | PEN_INJECTOR | Freq: Three times a day (TID) | SUBCUTANEOUS | 3 refills | Status: AC
  Filled 2024-07-16: qty 8.1, 90d supply, fill #0
  Filled 2024-09-10: qty 9, 90d supply, fill #0

## 2024-07-16 MED ORDER — APIXABAN 5 MG PO TABS
5.0000 mg | ORAL_TABLET | Freq: Two times a day (BID) | ORAL | 3 refills | Status: AC
  Filled 2024-07-16 – 2024-09-21 (×2): qty 180, 90d supply, fill #0

## 2024-07-16 MED ORDER — TRESIBA FLEXTOUCH 100 UNIT/ML ~~LOC~~ SOPN
8.0000 [IU] | PEN_INJECTOR | Freq: Every day | SUBCUTANEOUS | 3 refills | Status: AC
  Filled 2024-07-16: qty 7.2, 90d supply, fill #0
  Filled 2024-07-28: qty 6, 75d supply, fill #0
  Filled 2024-10-11: qty 6, 75d supply, fill #1
  Filled 2024-10-13 (×2): qty 6, 75d supply, fill #0

## 2024-07-16 MED ORDER — PANTOPRAZOLE SODIUM 20 MG PO TBEC
20.0000 mg | DELAYED_RELEASE_TABLET | Freq: Every day | ORAL | 3 refills | Status: AC
  Filled 2024-07-16 – 2024-07-27 (×2): qty 30, 30d supply, fill #0
  Filled 2024-08-25: qty 30, 30d supply, fill #1
  Filled 2024-09-17 – 2024-09-25 (×2): qty 30, 30d supply, fill #2
  Filled 2024-11-02: qty 30, 30d supply, fill #3

## 2024-07-16 NOTE — Assessment & Plan Note (Signed)
 The patient does have a longstanding history of type 2 diabetes that is very well-controlled.  A1c today is 7.0.  His current regimen includes Jardiance  25 mg daily, Tresiba  8 units nightly, insulin  lispro 4 units 4 times daily, and Mounjaro  5 mg.  He is reporting some lows especially when sleeping noting that his Dexcom wakes him up however he also feels jittery and not well when this happens.  We did discuss his current insulin  regimen and that he is taking 4 units of lispro 4 times a day including before meals and before bedtime.  He was instructed to stop taking the 4 units before bedtime as this is likely contributing to his lows while he is sleeping.  We did decrease mealtime coverage to 3 units as well. New current regimen: Tresiba  8 units nightly, insulin  lispro 3 units 3 times daily before meals, artistry 5 mg daily, Mounjaro  5 mg weekly.  He was instructed if he continues to have lows or difficulty with diabetes to call the office. Orders:   POC Hbg A1C   Insulin  Lispro-aabc (LYUMJEV  KWIKPEN) 100 UNIT/ML KwikPen; Inject 3 Units into the skin with breakfast, with lunch, and with evening meal.   insulin  degludec (TRESIBA  FLEXTOUCH) 100 UNIT/ML FlexTouch Pen; Inject 8 Units into the skin at bedtime.

## 2024-07-16 NOTE — Assessment & Plan Note (Signed)
 The patient is interested in decreasing several medications he is on.  He reports that he rarely takes his Protonix  as he does not have frequent GERD symptoms.  Reduce his dose to 20 mg as needed today. Orders:   pantoprazole  (PROTONIX ) 20 MG tablet; Take 1 tablet (20 mg total) by mouth daily.

## 2024-07-16 NOTE — Progress Notes (Signed)
 Established Patient Office Visit  Subjective   Patient ID: Trevor Fischer, male    DOB: [birth year]  Age: 63 y.o. MRN: [id number]  Chief Complaint  Patient presents with   Routine Checkup    Medication Refill   Diabetes   Trevor Fischer is a 63 year old man with past medical history of HV, type 2 diabetes, hypertension, GERD, IDA.  Please see progress assessment plan below.   Review of Systems  Constitutional:  Negative for chills and fever.  Eyes:  Negative for blurred vision and double vision.  Respiratory:  Negative for shortness of breath and wheezing.   Cardiovascular:  Negative for chest pain, palpitations and leg swelling.  Gastrointestinal:  Negative for abdominal pain, diarrhea, nausea and vomiting.  Neurological:  Negative for dizziness, tingling, sensory change and headaches.      Objective:     BP 125/70 (BP Location: Left Arm, Patient Position: Sitting, Cuff Size: Normal)   Pulse 60   Temp 97.7 F (36.5 C) (Oral)   Ht 6' 2 (1.88 m)   Wt 173 lb 9.6 oz (78.7 kg)   SpO2 98%   BMI 22.29 kg/m    Const: Awake, alert in NAD HENT: Normocephalic, atraumatic, mucus membranes moist Card: RRR, No MRG, No pitting edema on LE's bilaterally  Resp: LCTAB, no increased work of breathing Abd: Soft, NTND Extremities: Warm, pink  No results found for any visits on 07/16/24.  Last hemoglobin A1c Lab Results  Component Value Date   HGBA1C 7.0 07/16/2024      The ASCVD Risk score (Arnett DK, et al., 2019) failed to calculate for the following reasons:   The valid total cholesterol range is 130 to 320 mg/dL    Assessment & Plan:   Assessment & Plan Type 2 diabetes mellitus with diabetic neuropathy, with long-term current use of insulin  (HCC) The patient does have a longstanding history of type 2 diabetes that is very well-controlled.  A1c today is 7.0.  His current regimen includes Jardiance  25 mg daily, Tresiba  8 units nightly, insulin  lispro 4 units 4 times  daily, and Mounjaro  5 mg.  He is reporting some lows especially when sleeping noting that his Dexcom wakes him up however he also feels jittery and not well when this happens.  We did discuss his current insulin  regimen and that he is taking 4 units of lispro 4 times a day including before meals and before bedtime.  He was instructed to stop taking the 4 units before bedtime as this is likely contributing to his lows while he is sleeping.  We did decrease mealtime coverage to 3 units as well. New current regimen: Tresiba  8 units nightly, insulin  lispro 3 units 3 times daily before meals, artistry 5 mg daily, Mounjaro  5 mg weekly.  He was instructed if he continues to have lows or difficulty with diabetes to call the office. Orders:   POC Hbg A1C   Insulin  Lispro-aabc (LYUMJEV  KWIKPEN) 100 UNIT/ML KwikPen; Inject 3 Units into the skin with breakfast, with lunch, and with evening meal.   insulin  degludec (TRESIBA  FLEXTOUCH) 100 UNIT/ML FlexTouch Pen; Inject 8 Units into the skin at bedtime.  Gastroesophageal reflux disease without esophagitis The patient is interested in decreasing several medications he is on.  He reports that he rarely takes his Protonix  as he does not have frequent GERD symptoms.  Reduce his dose to 20 mg as needed today. Orders:   pantoprazole  (PROTONIX ) 20 MG tablet; Take 1 tablet (20 mg total) by mouth  daily.     Trevor Novak, DO

## 2024-07-16 NOTE — Patient Instructions (Signed)
 Thank you, [name], for allowing us  to provide your care today. Today we discussed . . .  > Diabetes       - We have changed your insulin  regimen  -Continue 8 units long acting  -Short acting take 3 units with meals.   I have ordered the following labs for you:   Lab Orders         POC Hbg A1C       Referrals ordered today:   Referral Orders  No referral(s) requested today      Follow up: 3 months    Remember:  Should you have any questions or concerns please call the internal medicine clinic at [phone].     Schuyler Novak, DO [facility]

## 2024-07-17 NOTE — Progress Notes (Signed)
 Internal Medicine Clinic Attending  I was physically present during the key portions of the resident provided service and participated in the medical decision making of patient's management care. I reviewed pertinent patient test results.  The assessment, diagnosis, and plan were formulated together and I agree with the documentation in the resident's note.  Rosan Dayton BROCKS, DO

## 2024-07-25 ENCOUNTER — Other Ambulatory Visit (HOSPITAL_COMMUNITY): Payer: Self-pay

## 2024-07-26 ENCOUNTER — Other Ambulatory Visit (HOSPITAL_COMMUNITY): Payer: Self-pay

## 2024-07-28 ENCOUNTER — Other Ambulatory Visit (HOSPITAL_COMMUNITY): Payer: Self-pay

## 2024-07-28 ENCOUNTER — Other Ambulatory Visit: Payer: Self-pay

## 2024-07-29 ENCOUNTER — Other Ambulatory Visit (HOSPITAL_COMMUNITY): Payer: Self-pay

## 2024-07-31 ENCOUNTER — Other Ambulatory Visit: Payer: Self-pay | Admitting: Pharmacy Technician

## 2024-07-31 ENCOUNTER — Other Ambulatory Visit: Payer: Self-pay

## 2024-07-31 NOTE — Progress Notes (Signed)
 Specialty Pharmacy Refill Coordination Note  JW COVIN is a 63 y.o. male contacted today regarding refills of specialty medication(s) Bictegravir-Emtricitab-Tenofov (BIKTARVY )   Patient requested Delivery   Delivery date: 08/07/24   Verified address: [address] [place]   Medication will be filled on: 08/06/24

## 2024-08-04 ENCOUNTER — Other Ambulatory Visit (HOSPITAL_COMMUNITY): Payer: Self-pay

## 2024-08-06 ENCOUNTER — Other Ambulatory Visit (HOSPITAL_COMMUNITY): Payer: Self-pay

## 2024-08-06 ENCOUNTER — Other Ambulatory Visit: Payer: Self-pay

## 2024-08-15 ENCOUNTER — Other Ambulatory Visit: Payer: Self-pay

## 2024-08-25 ENCOUNTER — Other Ambulatory Visit (HOSPITAL_COMMUNITY): Payer: Self-pay

## 2024-08-27 ENCOUNTER — Other Ambulatory Visit: Payer: Self-pay

## 2024-08-29 ENCOUNTER — Other Ambulatory Visit (HOSPITAL_COMMUNITY): Payer: Self-pay

## 2024-09-02 ENCOUNTER — Other Ambulatory Visit: Payer: Commercial Managed Care - PPO

## 2024-09-02 ENCOUNTER — Other Ambulatory Visit (HOSPITAL_COMMUNITY)
Admission: RE | Admit: 2024-09-02 | Discharge: 2024-09-02 | Disposition: A | Discharge status: Home / Self Care | Source: Ambulatory Visit | Attending: Infectious Disease | Admitting: Infectious Disease

## 2024-09-02 ENCOUNTER — Other Ambulatory Visit: Payer: Self-pay

## 2024-09-02 DIAGNOSIS — Z794 Long term (current) use of insulin: Secondary | ICD-10-CM | POA: Diagnosis not present

## 2024-09-02 DIAGNOSIS — E11610 Type 2 diabetes mellitus with diabetic neuropathic arthropathy: Secondary | ICD-10-CM | POA: Diagnosis not present

## 2024-09-02 DIAGNOSIS — B20 Human immunodeficiency virus [HIV] disease: Secondary | ICD-10-CM | POA: Diagnosis not present

## 2024-09-02 DIAGNOSIS — E1140 Type 2 diabetes mellitus with diabetic neuropathy, unspecified: Secondary | ICD-10-CM | POA: Diagnosis not present

## 2024-09-03 LAB — URINE CYTOLOGY ANCILLARY ONLY
Chlamydia: NEGATIVE
Comment: NEGATIVE
Comment: NORMAL
Neisseria Gonorrhea: NEGATIVE

## 2024-09-03 LAB — T-HELPER CELL (CD4) - (RCID CLINIC ONLY)
CD4 % Helper T Cell: 41 % (ref 33–65)
CD4 T Cell Abs: 486 /uL (ref 400–1790)

## 2024-09-04 LAB — COMPLETE METABOLIC PANEL WITHOUT GFR
AG Ratio: 1.9 (calc) (ref 1.0–2.5)
ALT: 41 U/L (ref 9–46)
AST: 28 U/L (ref 10–35)
Albumin: 4.7 g/dL (ref 3.6–5.1)
Alkaline phosphatase (APISO): 98 U/L (ref 35–144)
BUN: 14 mg/dL (ref 7–25)
CO2: 34 mmol/L — ABNORMAL HIGH (ref 20–32)
Calcium: 9.3 mg/dL (ref 8.6–10.3)
Chloride: 100 mmol/L (ref 98–110)
Creat: 1.04 mg/dL (ref 0.70–1.35)
Globulin: 2.5 g/dL (ref 1.9–3.7)
Glucose, Bld: 135 mg/dL — ABNORMAL HIGH (ref 65–99)
Potassium: 4.2 mmol/L (ref 3.5–5.3)
Sodium: 141 mmol/L (ref 135–146)
Total Bilirubin: 0.7 mg/dL (ref 0.2–1.2)
Total Protein: 7.2 g/dL (ref 6.1–8.1)

## 2024-09-04 LAB — CBC WITH DIFFERENTIAL/PLATELET
Absolute Lymphocytes: 1328 {cells}/uL (ref 850–3900)
Absolute Monocytes: 291 {cells}/uL (ref 200–950)
Basophils Absolute: 49 {cells}/uL (ref 0–200)
Basophils Relative: 1.2 %
Eosinophils Absolute: 103 {cells}/uL (ref 15–500)
Eosinophils Relative: 2.5 %
HCT: 51.7 % — ABNORMAL HIGH (ref 39.4–51.1)
Hemoglobin: 16.6 g/dL (ref 13.2–17.1)
MCH: 27.6 pg (ref 27.0–33.0)
MCHC: 32.1 g/dL (ref 31.6–35.4)
MCV: 86 fL (ref 81.4–101.7)
MPV: 9.8 fL (ref 7.5–12.5)
Monocytes Relative: 7.1 %
Neutro Abs: 2329 {cells}/uL (ref 1500–7800)
Neutrophils Relative %: 56.8 %
Platelets: 212 Thousand/uL (ref 140–400)
RBC: 6.01 Million/uL — ABNORMAL HIGH (ref 4.20–5.80)
RDW: 12.6 % (ref 11.0–15.0)
Total Lymphocyte: 32.4 %
WBC: 4.1 Thousand/uL (ref 3.8–10.8)

## 2024-09-04 LAB — HIV-1 RNA QUANT-NO REFLEX-BLD
HIV 1 RNA Quant: NOT DETECTED {copies}/mL
HIV-1 RNA Quant, Log: NOT DETECTED {Log_copies}/mL

## 2024-09-04 LAB — SYPHILIS: RPR W/REFLEX TO RPR TITER AND TREPONEMAL ANTIBODIES, TRADITIONAL SCREENING AND DIAGNOSIS ALGORITHM: RPR Ser Ql: NONREACTIVE

## 2024-09-09 ENCOUNTER — Other Ambulatory Visit: Payer: Self-pay

## 2024-09-10 ENCOUNTER — Other Ambulatory Visit (HOSPITAL_COMMUNITY): Payer: Self-pay

## 2024-09-11 ENCOUNTER — Other Ambulatory Visit: Payer: Self-pay

## 2024-09-11 NOTE — Progress Notes (Signed)
 Specialty Pharmacy Refill Coordination Note  DARYN HICKS is a 63 y.o. male contacted today regarding refills of specialty medication(s) Bictegravir-Emtricitab-Tenofov (BIKTARVY )   Patient requested Delivery   Delivery date: 09/12/24   Verified address: [address] [place]   Medication will be filled on: 09/11/24

## 2024-09-15 NOTE — Progress Notes (Unsigned)
 Subjective:  Chief complaint: follow-up for HIV disease on medications   Patient ID: Trevor Fischer, male    DOB: 1961/08/14, 63 y.o.   MRN: [id number]  HPI  Past Medical History:  Diagnosis Date   Allergic rhinitis    Degenerative joint disease of knee    DVT (deep venous thrombosis) (HCC) RLE X 2   Ended anticoagulation in 2012   Flatulence 05/13/2015   Flatulence 05/13/2015   GERD (gastroesophageal reflux disease)    Headache 11/09/2023   History of DVT of lower extremity 07/2007   HIV infection (HCC)    Hx MRSA infection    Hyperlipidemia    Hyperlipidemia 11/17/2021   Hypertension    Lumbar radiculopathy, right 12/10/2017   Memory changes 11/17/2022   Pulmonary embolism (HCC) 05/14/2017   PVC (premature ventricular contraction) 06/18/2020   Superficial thrombophlebitis    Transaminitis 10/14/2014   Type II diabetes mellitus (HCC) 06/2008    Past Surgical History:  Procedure Laterality Date   ENDOVENOUS ABLATION SAPHENOUS VEIN W/ LASER  07-17-2011 LEFT GRERATER SAPHENOUS VEIN AND STAB PHLEBECTOMIES   10-20   LEFT LEG   ENDOVENOUS ABLATION SAPHENOUS VEIN W/ LASER Left 08/09/2022   endovenous laser ablation left small saphenous vein and stab phlebectomy 10-20 incisions left leg by Medford Blade MD   VEIN LIGATION AND STRIPPING Bilateral     Family History  Problem Relation Age of Onset   Diabetes Father    Kidney disease Father    Heart failure Father    Hyperlipidemia Father    Hypertension Father    Osteoarthritis Mother       Social History   Socioeconomic History   Marital status: Married    Spouse name: Not on file   Number of children: Not on file   Years of education: Not on file   Highest education level: Not on file  Occupational History   Not on file  Tobacco Use   Smoking status: Former    Current packs/day: 0.00    Average packs/day: 1 pack/day for 28.0 years (28.0 ttl pk-yrs)    Types: Cigarettes    Start date: 07/02/1977     Quit date: 07/02/2005    Years since quitting: 19.2   Smokeless tobacco: Never  Vaping Use   Vaping status: Never Used  Substance and Sexual Activity   Alcohol  use: Yes    Alcohol /week: 0.0 standard drinks of alcohol     Comment: 05/14/2017 1-2 drinks/year   Drug use: No   Sexual activity: Not Currently    Partners: Male    Comment: declined condoms  Other Topics Concern   Not on file  Social History Narrative   Lives in [place], with partner of 26 years    Works as Psychologist, sport and exercise at [employer]   Has [employer]   Social Drivers of Health   Tobacco Use: Medium Risk (05/28/2024)   Patient History    Smoking Tobacco Use: Former    Smokeless Tobacco Use: Never    Passive Exposure: Not on Actuary Strain: Low Risk (04/29/2024)   Overall Financial Resource Strain (CARDIA)    Difficulty of Paying Living Expenses: Not hard at all  Food Insecurity: No Food Insecurity (04/29/2024)   Epic    Worried About Radiation Protection Practitioner of Food in the Last Year: Never true    Ran Out of Food in the Last Year: Never true  Transportation Needs: No Transportation Needs (04/29/2024)   Epic  Lack of Transportation (Medical): No    Lack of Transportation (Non-Medical): No  Physical Activity: Sufficiently Active (04/29/2024)   Exercise Vital Sign    Days of Exercise per Week: 5 days    Minutes of Exercise per Session: 60 min  Stress: No Stress Concern Present (04/29/2024)   [employer] of Occupational Health - Occupational Stress Questionnaire    Feeling of Stress: Only a little  Social Connections: Moderately Isolated (04/29/2024)   Social Connection and Isolation Panel    Frequency of Communication with Friends and Family: More than three times a week    Frequency of Social Gatherings with Friends and Family: Once a week    Attends Religious Services: Never    Database Administrator or Organizations: No    Attends Banker Meetings: Never    Marital Status:  Married  Depression (PHQ2-9): Low Risk (07/16/2024)   Depression (PHQ2-9)    PHQ-2 Score: 0  Alcohol  Screen: Low Risk (04/29/2024)   Alcohol  Screen    Last Alcohol  Screening Score (AUDIT): 1  Housing: Low Risk (04/29/2024)   Epic    Unable to Pay for Housing in the Last Year: No    Number of Times Moved in the Last Year: 0    Homeless in the Last Year: No  Utilities: Not At Risk (05/14/2023)   AHC Utilities    Threatened with loss of utilities: No  Health Literacy: Adequate Health Literacy (04/29/2024)   B1300 Health Literacy    Frequency of need for help with medical instructions: Rarely    Allergies[1]  Current Medications[2]   Review of Systems     Objective:   Physical Exam        Assessment & Plan:       [1]  Allergies Allergen Reactions   Famotidine Other (See Comments)    Co-administration will lower complera  levels   Amoxicillin-Pot Clavulanate Other (See Comments)   Amoxicillin-Pot Clavulanate Rash   Metformin  And Related Diarrhea  [2]  Current Outpatient Medications:    Accu-Chek FastClix Lancets MISC, USE TO CHECK BLOOD SUGAR UP TO 4 TIMES A DAY, Disp: 102 each, Rfl: PRN   ACCU-CHEK GUIDE test strip, USE TO CHECK BLOOD SUGAR UP TO 4 TIMES A DAY, Disp: 400 strip, Rfl: PRN   acetaminophen  (TYLENOL ) 500 MG tablet, Take 2 tablets (1,000 mg total) by mouth 2 (two) times daily., Disp: 30 tablet, Rfl: 3   Alcohol  Swabs  (EASY TOUCH ALCOHOL  PREP MEDIUM) 70 % PADS, Use as directed up to 10 daily, Disp: 1000 each, Rfl: 3   apixaban  (ELIQUIS ) 5 MG TABS tablet, Take 1 tablet (5 mg total) by mouth 2 (two) times daily., Disp: 180 tablet, Rfl: 3   atorvastatin  (LIPITOR) 40 MG tablet, Take 1 tablet (40 mg total) by mouth daily., Disp: 90 tablet, Rfl: 3   baclofen  (LIORESAL ) 20 MG tablet, Take 1 tablet (20 mg total) by mouth 3 (three) times daily., Disp: 90 tablet, Rfl: 2   bictegravir-emtricitabine -tenofovir  AF (BIKTARVY ) 50-200-25 MG TABS tablet, TAKE 1 TABLET BY MOUTH  DAILY., Disp: 30 tablet, Rfl: 11   Blood Glucose Monitoring Suppl (FREESTYLE LITE) DEVI, , Disp: , Rfl:    Continuous Glucose Sensor (DEXCOM G7 SENSOR) MISC, change every 10 days, Disp: 9 each, Rfl: 3   Continuous Glucose Sensor (DEXCOM G7 SENSOR) MISC, Use as directed to check blood sugar. Change every 10 days., Disp: 4 each, Rfl: 3   empagliflozin  (JARDIANCE ) 25 MG TABS tablet, Take 1 tablet (25  mg total) by mouth daily before breakfast., Disp: 90 tablet, Rfl: 3   FREESTYLE LITE test strip, , Disp: , Rfl:    gabapentin  (NEURONTIN ) 800 MG tablet, Take 1 tablet (800 mg total) by mouth 3 (three) times daily., Disp: 270 tablet, Rfl: 3   insulin  degludec (TRESIBA  FLEXTOUCH) 100 UNIT/ML FlexTouch Pen, Inject 8 Units into the skin at bedtime., Disp: 9 mL, Rfl: 3   Insulin  Lispro-aabc (LYUMJEV  KWIKPEN) 100 UNIT/ML KwikPen, Inject 3 Units into the skin with breakfast, with lunch, and with evening meal., Disp: 9 mL, Rfl: 3   Insulin  Pen Needle (INSUPEN PEN NEEDLES) 32G X 4 MM MISC, Use as directed 6 times daily, Disp: 100 each, Rfl: 8   Insulin  Pen Needle (PEN NEEDLES) 31G X 5 MM MISC, Use daily as directed., Disp: 100 each, Rfl: 1   Lancets (FREESTYLE) lancets, USE TO CHECK BLOOD SUGAR UP TO 4 TIMES A DAY, Disp: 100 each, Rfl: 99   losartan  (COZAAR ) 50 MG tablet, Take 1 tablet (50 mg total) by mouth daily., Disp: 90 tablet, Rfl: 1   Omega-3 Fatty Acids (FISH OIL) 1000 MG CAPS, Take 1 capsule by mouth 2 (two) times daily., Disp: , Rfl:    pantoprazole  (PROTONIX ) 20 MG tablet, Take 1 tablet (20 mg total) by mouth daily., Disp: 30 tablet, Rfl: 3   silodosin  (RAPAFLO ) 8 MG CAPS capsule, Take 1 capsule (8 mg total) by mouth daily., Disp: 90 capsule, Rfl: 3   Simethicone  180 MG CAPS, Take 1 capsule (180 mg total) by mouth 3 (three) times daily as needed., Disp: 60 capsule, Rfl: 3   tadalafil  (CIALIS ) 5 MG tablet, Take 1 tablet (5 mg total) by mouth daily., Disp: 30 tablet, Rfl: 11   tamsulosin  (FLOMAX ) 0.4 MG  CAPS capsule, Take 1 capsule (0.4 mg total) by mouth daily., Disp: 30 capsule, Rfl: 11   tirzepatide  (MOUNJARO ) 5 MG/0.5ML Pen, Inject 5 mg into the skin once a week., Disp: 2 mL, Rfl: 3   triamterene -hydrochlorothiazide  (MAXZIDE ) 75-50 MG tablet, Take 1/2 tablet by mouth daily., Disp: 45 tablet, Rfl: 1

## 2024-09-16 ENCOUNTER — Other Ambulatory Visit (HOSPITAL_BASED_OUTPATIENT_CLINIC_OR_DEPARTMENT_OTHER): Payer: Self-pay

## 2024-09-16 ENCOUNTER — Other Ambulatory Visit (HOSPITAL_COMMUNITY): Payer: Self-pay

## 2024-09-16 ENCOUNTER — Other Ambulatory Visit: Payer: Self-pay

## 2024-09-16 ENCOUNTER — Encounter: Payer: Self-pay | Admitting: Infectious Disease

## 2024-09-16 ENCOUNTER — Ambulatory Visit: Payer: Self-pay | Admitting: Infectious Disease

## 2024-09-16 VITALS — BP 117/73 | HR 63 | Temp 97.5°F | Ht 74.0 in | Wt 174.0 lb

## 2024-09-16 DIAGNOSIS — I10 Essential (primary) hypertension: Secondary | ICD-10-CM

## 2024-09-16 DIAGNOSIS — E1140 Type 2 diabetes mellitus with diabetic neuropathy, unspecified: Secondary | ICD-10-CM | POA: Diagnosis not present

## 2024-09-16 DIAGNOSIS — B20 Human immunodeficiency virus [HIV] disease: Secondary | ICD-10-CM

## 2024-09-16 DIAGNOSIS — Z23 Encounter for immunization: Secondary | ICD-10-CM | POA: Diagnosis not present

## 2024-09-16 DIAGNOSIS — Z794 Long term (current) use of insulin: Secondary | ICD-10-CM | POA: Diagnosis not present

## 2024-09-16 DIAGNOSIS — Z7185 Encounter for immunization safety counseling: Secondary | ICD-10-CM

## 2024-09-16 DIAGNOSIS — E785 Hyperlipidemia, unspecified: Secondary | ICD-10-CM

## 2024-09-16 DIAGNOSIS — I82403 Acute embolism and thrombosis of unspecified deep veins of lower extremity, bilateral: Secondary | ICD-10-CM

## 2024-09-16 DIAGNOSIS — Z79899 Other long term (current) drug therapy: Secondary | ICD-10-CM | POA: Diagnosis not present

## 2024-09-16 MED ORDER — AREXVY 120 MCG/0.5ML IM SUSR
0.5000 mL | Freq: Once | INTRAMUSCULAR | 0 refills | Status: AC
  Filled 2024-09-16: qty 0.5, 1d supply, fill #0

## 2024-09-16 MED ORDER — BICTEGRAVIR-EMTRICITAB-TENOFOV 50-200-25 MG PO TABS
1.0000 | ORAL_TABLET | Freq: Every day | ORAL | 11 refills | Status: AC
  Filled 2024-09-16 – 2024-10-03 (×3): qty 30, 30d supply, fill #0
  Filled 2024-10-30 – 2024-10-31 (×4): qty 30, 30d supply, fill #1

## 2024-09-16 NOTE — Addendum Note (Signed)
 Addended by: FLEETA KATHIE FLEET N on: 09/16/2024 11:14 AM   Modules accepted: Orders

## 2024-09-17 ENCOUNTER — Other Ambulatory Visit (HOSPITAL_COMMUNITY): Payer: Self-pay

## 2024-09-19 ENCOUNTER — Other Ambulatory Visit (HOSPITAL_COMMUNITY): Payer: Self-pay

## 2024-09-20 ENCOUNTER — Other Ambulatory Visit (HOSPITAL_COMMUNITY): Payer: Self-pay

## 2024-09-22 ENCOUNTER — Other Ambulatory Visit: Payer: Self-pay | Admitting: Student

## 2024-09-22 ENCOUNTER — Other Ambulatory Visit: Payer: Self-pay

## 2024-09-22 ENCOUNTER — Other Ambulatory Visit (HOSPITAL_COMMUNITY): Payer: Self-pay

## 2024-09-22 DIAGNOSIS — I10 Essential (primary) hypertension: Secondary | ICD-10-CM

## 2024-09-22 MED ORDER — LOSARTAN POTASSIUM 50 MG PO TABS
50.0000 mg | ORAL_TABLET | Freq: Every day | ORAL | 1 refills | Status: AC
  Filled 2024-09-22: qty 90, 90d supply, fill #0

## 2024-09-22 NOTE — Telephone Encounter (Signed)
 Medication sent to pharmacy

## 2024-09-23 ENCOUNTER — Other Ambulatory Visit: Payer: Self-pay | Admitting: Student

## 2024-09-23 ENCOUNTER — Other Ambulatory Visit (HOSPITAL_COMMUNITY): Payer: Self-pay

## 2024-09-23 MED ORDER — TADALAFIL 5 MG PO TABS
5.0000 mg | ORAL_TABLET | Freq: Every day | ORAL | 11 refills | Status: AC
  Filled 2024-09-23: qty 30, 30d supply, fill #0
  Filled 2024-10-17: qty 30, 30d supply, fill #1

## 2024-09-23 NOTE — Telephone Encounter (Signed)
 Copied from CRM #[id number]. Topic: Clinical - Medication Refill >> Sep 23, 2024  9:16 AM Cherylann RAMAN wrote: Medication: tadalafil  (CIALIS ) 5 MG table, tamsulosin  (FLOMAX ) 0.4 MG CAPS capsule  Has the patient contacted their pharmacy? No (Agent: If no, request that the patient contact the pharmacy for the refill. If patient does not wish to contact the pharmacy document the reason why and proceed with request.) (Agent: If yes, when and what did the pharmacy advise?) Needs provider's approval  [facility] [address] [place] [address] Phone: [phone] Fax: [phone]  Is this the correct pharmacy for this prescription? Yes If no, delete pharmacy and type the correct one.   Has the prescription been filled recently? Yes  Is the patient out of the medication? No  Has the patient been seen for an appointment in the last year OR does the patient have an upcoming appointment? Yes  Can we respond through MyChart? Yes  Agent: Please be advised that Rx refills may take up to 3 business days. We ask that you follow-up with your pharmacy.

## 2024-09-26 ENCOUNTER — Other Ambulatory Visit (HOSPITAL_COMMUNITY): Payer: Self-pay

## 2024-09-26 ENCOUNTER — Other Ambulatory Visit: Payer: Self-pay

## 2024-10-03 ENCOUNTER — Other Ambulatory Visit: Payer: Self-pay

## 2024-10-03 ENCOUNTER — Other Ambulatory Visit (HOSPITAL_COMMUNITY): Payer: Self-pay

## 2024-10-03 NOTE — Progress Notes (Signed)
 Specialty Pharmacy Refill Coordination Note  Trevor Fischer is a 64 y.o. male contacted today regarding refills of specialty medication(s) Bictegravir-Emtricitab-Tenofov (BIKTARVY )   Patient requested Delivery   Delivery date: 10/08/24   Verified address: [address] [place]   Medication will be filled on: 10/07/24

## 2024-10-06 ENCOUNTER — Other Ambulatory Visit: Payer: Self-pay | Admitting: Student

## 2024-10-06 ENCOUNTER — Other Ambulatory Visit (HOSPITAL_COMMUNITY): Payer: Self-pay

## 2024-10-06 ENCOUNTER — Other Ambulatory Visit: Payer: Self-pay

## 2024-10-06 MED ORDER — MOUNJARO 5 MG/0.5ML ~~LOC~~ SOAJ
5.0000 mg | SUBCUTANEOUS | 3 refills | Status: AC
  Filled 2024-10-06: qty 2, 28d supply, fill #0
  Filled 2024-11-02: qty 2, 28d supply, fill #1

## 2024-10-06 NOTE — Telephone Encounter (Signed)
 Medication sent to pharmacy

## 2024-10-07 ENCOUNTER — Other Ambulatory Visit: Payer: Self-pay

## 2024-10-08 ENCOUNTER — Other Ambulatory Visit: Payer: Self-pay

## 2024-10-09 ENCOUNTER — Other Ambulatory Visit (HOSPITAL_COMMUNITY): Payer: Self-pay

## 2024-10-09 ENCOUNTER — Other Ambulatory Visit: Payer: Self-pay | Admitting: Student

## 2024-10-09 DIAGNOSIS — I10 Essential (primary) hypertension: Secondary | ICD-10-CM

## 2024-10-09 MED ORDER — TRIAMTERENE-HCTZ 75-50 MG PO TABS
0.5000 | ORAL_TABLET | Freq: Every day | ORAL | 1 refills | Status: AC
  Filled 2024-10-09 (×2): qty 45, 90d supply, fill #0

## 2024-10-09 NOTE — Telephone Encounter (Signed)
 Medication sent to pharmacy

## 2024-10-11 ENCOUNTER — Other Ambulatory Visit: Payer: Self-pay | Admitting: Student

## 2024-10-11 DIAGNOSIS — G8929 Other chronic pain: Secondary | ICD-10-CM

## 2024-10-13 ENCOUNTER — Other Ambulatory Visit: Payer: Self-pay

## 2024-10-13 ENCOUNTER — Encounter: Payer: Self-pay | Admitting: Student

## 2024-10-13 ENCOUNTER — Other Ambulatory Visit (HOSPITAL_COMMUNITY): Payer: Self-pay

## 2024-10-13 ENCOUNTER — Ambulatory Visit: Payer: Self-pay | Admitting: Student

## 2024-10-13 VITALS — BP 127/70 | HR 58 | Temp 97.9°F | Ht 74.0 in | Wt 176.0 lb

## 2024-10-13 DIAGNOSIS — Z794 Long term (current) use of insulin: Secondary | ICD-10-CM | POA: Diagnosis not present

## 2024-10-13 DIAGNOSIS — Z7985 Long-term (current) use of injectable non-insulin antidiabetic drugs: Secondary | ICD-10-CM

## 2024-10-13 DIAGNOSIS — E785 Hyperlipidemia, unspecified: Secondary | ICD-10-CM | POA: Diagnosis not present

## 2024-10-13 DIAGNOSIS — Z23 Encounter for immunization: Secondary | ICD-10-CM

## 2024-10-13 DIAGNOSIS — Z86711 Personal history of pulmonary embolism: Secondary | ICD-10-CM

## 2024-10-13 DIAGNOSIS — K219 Gastro-esophageal reflux disease without esophagitis: Secondary | ICD-10-CM | POA: Diagnosis not present

## 2024-10-13 DIAGNOSIS — Z7984 Long term (current) use of oral hypoglycemic drugs: Secondary | ICD-10-CM | POA: Diagnosis not present

## 2024-10-13 DIAGNOSIS — I10 Essential (primary) hypertension: Secondary | ICD-10-CM

## 2024-10-13 DIAGNOSIS — Z87891 Personal history of nicotine dependence: Secondary | ICD-10-CM

## 2024-10-13 DIAGNOSIS — E1140 Type 2 diabetes mellitus with diabetic neuropathy, unspecified: Secondary | ICD-10-CM | POA: Diagnosis present

## 2024-10-13 LAB — POCT GLYCOSYLATED HEMOGLOBIN (HGB A1C): HbA1c, POC (controlled diabetic range): 7 % (ref 0.0–7.0)

## 2024-10-13 LAB — GLUCOSE, CAPILLARY: Glucose-Capillary: 137 mg/dL — ABNORMAL HIGH (ref 70–99)

## 2024-10-13 MED ORDER — BACLOFEN 20 MG PO TABS
20.0000 mg | ORAL_TABLET | Freq: Three times a day (TID) | ORAL | 0 refills | Status: AC
  Filled 2024-10-13: qty 90, 30d supply, fill #0

## 2024-10-13 NOTE — Patient Instructions (Signed)
 Thank you, Trevor Fischer for allowing us  to provide your care today. Today we discussed your blood sugar,blood pressure and your medications.  Lab Results  Component Value Date   HGBA1C 7.0 10/13/2024   HGBA1C 7.0 07/16/2024   HGBA1C 6.9 (H) 04/29/2024    Your A1c is great and my goal is to get you off insulin  in the near future if its still at goal. Discuss increasing your Mounjaro  to 7.5 when we see you again and we can  think about stopping your insulin  if no issue arise .  Hold your Protonix  for 2-3 weeks and only resume when the reflux returns.  I have ordered the following labs for you:  Lab Orders         Glucose, capillary         POC Hbg A1C      Tests ordered today:    Referrals ordered today:   Referral Orders  No referral(s) requested today     I have ordered the following medication/changed the following medications:   Stop the following medications: There are no discontinued medications.   Start the following medications: No orders of the defined types were placed in this encounter.    Follow up: 3 months  for DM follow up and medication changes.   Should you have any questions or concerns please call the internal medicine clinic at [phone].   Drue Lisa Grow MD 10/13/2024, 10:29 AM   [facility]

## 2024-10-13 NOTE — Progress Notes (Unsigned)
 "  CC: 3 months diabetes follow up   BP is 127/70-  Continue Maxzide  75-50 and lorsatan . Continuing   DM: A1c is  7 . Last LOV visit he  Reported low blood sugar so his lispro was decreased from 4 TID to 3 TID. Today, review of his CMG shows no hypoglycemia but few highs usually after Lunch and dinner but overall 70% in range. I am not making any changes today .Tresiba  8 units nightly, insulin  lispro 3 units 3 times daily before meals, artistry 5 mg daily, Mounjaro  5 mg weekly and Jardiance  25 mg . Doing a foot exam   GERD: He barely takes it. I want to trail him off and then take him off it completely if his reflux  doesn't return in 2-3 weeks.   Hx of DVT- On Eliquis . Stable., Will continue to monitor   HLD: Last lipid panel may 2025 , LDL of 69. On lipitor 40 mg. Will contionue to monitor   He is gotten the Flu vaccine at work as well as the hep B. All other care gaps are up to date     HPI:  Trevor Fischer is a 64 y.o. male living with a history stated below and presents today for ***. Please see problem based assessment and plan for additional details.  Past Medical History:  Diagnosis Date   Allergic rhinitis    Degenerative joint disease of knee    DVT (deep venous thrombosis) (HCC) RLE X 2   Ended anticoagulation in 2012   Flatulence 05/13/2015   Flatulence 05/13/2015   GERD (gastroesophageal reflux disease)    Headache 11/09/2023   History of DVT of lower extremity 07/2007   HIV infection (HCC)    Hx MRSA infection    Hyperlipidemia    Hyperlipidemia 11/17/2021   Hypertension    Lumbar radiculopathy, right 12/10/2017   Memory changes 11/17/2022   Pulmonary embolism (HCC) 05/14/2017   PVC (premature ventricular contraction) 06/18/2020   Superficial thrombophlebitis    Transaminitis 10/14/2014   Type II diabetes mellitus (HCC) 06/2008    Medications Ordered Prior to Encounter[1]  Family History  Problem Relation Age of Onset   Diabetes Father     Kidney disease Father    Heart failure Father    Hyperlipidemia Father    Hypertension Father    Osteoarthritis Mother     Social History   Socioeconomic History   Marital status: Married    Spouse name: Not on file   Number of children: Not on file   Years of education: Not on file   Highest education level: Not on file  Occupational History   Not on file  Tobacco Use   Smoking status: Former    Current packs/day: 0.00    Average packs/day: 1 pack/day for 28.0 years (28.0 ttl pk-yrs)    Types: Cigarettes    Start date: 07/02/1977    Quit date: 07/02/2005    Years since quitting: 19.2   Smokeless tobacco: Never  Vaping Use   Vaping status: Never Used  Substance and Sexual Activity   Alcohol  use: Not Currently    Comment: 05/14/2017 1-2 drinks/year   Drug use: No   Sexual activity: Not Currently    Partners: Male    Comment: declined condoms  Other Topics Concern   Not on file  Social History Narrative   Lives in [place], with partner of 26 years    Works as Psychologist, sport and exercise at [employer]  Has [employer] medical insurance   Social Drivers of Health   Tobacco Use: Medium Risk (10/13/2024)   Patient History    Smoking Tobacco Use: Former    Smokeless Tobacco Use: Never    Passive Exposure: Not on file  Financial Resource Strain: Low Risk (04/29/2024)   Overall Financial Resource Strain (CARDIA)    Difficulty of Paying Living Expenses: Not hard at all  Food Insecurity: No Food Insecurity (04/29/2024)   Epic    Worried About Programme Researcher, Broadcasting/film/video in the Last Year: Never true    Ran Out of Food in the Last Year: Never true  Transportation Needs: No Transportation Needs (04/29/2024)   Epic    Lack of Transportation (Medical): No    Lack of Transportation (Non-Medical): No  Physical Activity: Sufficiently Active (04/29/2024)   Exercise Vital Sign    Days of Exercise per Week: 5 days    Minutes of Exercise per Session: 60 min  Stress: No Stress Concern Present (04/29/2024)    [employer] of Occupational Health - Occupational Stress Questionnaire    Feeling of Stress: Only a little  Social Connections: Moderately Isolated (04/29/2024)   Social Connection and Isolation Panel    Frequency of Communication with Friends and Family: More than three times a week    Frequency of Social Gatherings with Friends and Family: Once a week    Attends Religious Services: Never    Database Administrator or Organizations: No    Attends Banker Meetings: Never    Marital Status: Married  Catering Manager Violence: Not At Risk (04/29/2024)   Epic    Fear of Current or Ex-Partner: No    Emotionally Abused: No    Physically Abused: No    Sexually Abused: No  Depression (PHQ2-9): Low Risk (10/13/2024)   Depression (PHQ2-9)    PHQ-2 Score: 0  Alcohol  Screen: Low Risk (04/29/2024)   Alcohol  Screen    Last Alcohol  Screening Score (AUDIT): 1  Housing: Low Risk (04/29/2024)   Epic    Unable to Pay for Housing in the Last Year: No    Number of Times Moved in the Last Year: 0    Homeless in the Last Year: No  Utilities: Not At Risk (05/14/2023)   AHC Utilities    Threatened with loss of utilities: No  Health Literacy: Adequate Health Literacy (04/29/2024)   B1300 Health Literacy    Frequency of need for help with medical instructions: Rarely    Review of Systems: ROS negative except for what is noted on the assessment and plan.  Vitals:   10/13/24 0947  BP: 127/70  Pulse: (!) 58  Temp: 97.9 F (36.6 C)  TempSrc: Oral  SpO2: 100%  Weight: 176 lb (79.8 kg)  Height: 6' 2 (1.88 m)    Physical Exam: Constitutional: well-appearing *** sitting in ***, in no acute distress HENT: normocephalic atraumatic, mucous membranes moist Eyes: conjunctiva non-erythematous Cardiovascular: regular rate and rhythm, no m/r/g Pulmonary/Chest: normal work of breathing on room air, lungs clear to auscultation bilaterally Abdominal: soft, non-tender, non-distended MSK:  normal bulk and tone Neurological: alert & oriented x 3, no focal deficit Skin: warm and dry Psych: normal mood and behavior  Assessment & Plan:   No problem-specific Assessment & Plan notes found for this encounter.     Patient {GC/[id number]::discussed with,seen with} Dr. {[id number]::Tpoopjfd,Z. Hoffman,Mullen,Narendra,Vincent,Guilloud,Lau,Machen}    Drue Grow, M.D [facility] Phone: [phone] Date 10/13/2024 Time 10:03 AM     [  1]  Current Outpatient Medications on File Prior to Visit  Medication Sig Dispense Refill   Accu-Chek FastClix Lancets MISC USE TO CHECK BLOOD SUGAR UP TO 4 TIMES A DAY 102 each PRN   ACCU-CHEK GUIDE test strip USE TO CHECK BLOOD SUGAR UP TO 4 TIMES A DAY 400 strip PRN   acetaminophen  (TYLENOL ) 500 MG tablet Take 2 tablets (1,000 mg total) by mouth 2 (two) times daily. 30 tablet 3   Alcohol  Swabs  (EASY TOUCH ALCOHOL  PREP MEDIUM) 70 % PADS Use as directed up to 10 daily 1000 each 3   apixaban  (ELIQUIS ) 5 MG TABS tablet Take 1 tablet (5 mg total) by mouth 2 (two) times daily. 180 tablet 3   atorvastatin  (LIPITOR) 40 MG tablet Take 1 tablet (40 mg total) by mouth daily. 90 tablet 3   baclofen  (LIORESAL ) 20 MG tablet Take 1 tablet (20 mg total) by mouth 3 (three) times daily. 90 tablet 2   bictegravir-emtricitabine -tenofovir  AF (BIKTARVY ) 50-200-25 MG TABS tablet TAKE 1 TABLET BY MOUTH DAILY. 30 tablet 11   Blood Glucose Monitoring Suppl (FREESTYLE LITE) DEVI      Continuous Glucose Sensor (DEXCOM G7 SENSOR) MISC change every 10 days 9 each 3   Continuous Glucose Sensor (DEXCOM G7 SENSOR) MISC Use as directed to check blood sugar. Change every 10 days. 4 each 3   empagliflozin  (JARDIANCE ) 25 MG TABS tablet Take 1 tablet (25 mg total) by mouth daily before breakfast. 90 tablet 3   FREESTYLE LITE test strip      gabapentin  (NEURONTIN ) 800 MG tablet Take 1 tablet (800 mg total) by mouth 3 (three) times daily. 270  tablet 3   insulin  degludec (TRESIBA  FLEXTOUCH) 100 UNIT/ML FlexTouch Pen Inject 8 Units into the skin at bedtime. 9 mL 3   Insulin  Lispro-aabc (LYUMJEV  KWIKPEN) 100 UNIT/ML KwikPen Inject 3 Units into the skin with breakfast, with lunch, and with evening meal. 9 mL 3   Insulin  Pen Needle (INSUPEN PEN NEEDLES) 32G X 4 MM MISC Use as directed 6 times daily 100 each 8   Insulin  Pen Needle (PEN NEEDLES) 31G X 5 MM MISC Use daily as directed. 100 each 1   Lancets (FREESTYLE) lancets USE TO CHECK BLOOD SUGAR UP TO 4 TIMES A DAY 100 each 99   losartan  (COZAAR ) 50 MG tablet Take 1 tablet (50 mg total) by mouth daily. 90 tablet 1   Omega-3 Fatty Acids (FISH OIL) 1000 MG CAPS Take 1 capsule by mouth 2 (two) times daily.     pantoprazole  (PROTONIX ) 20 MG tablet Take 1 tablet (20 mg total) by mouth daily. 30 tablet 3   silodosin  (RAPAFLO ) 8 MG CAPS capsule Take 1 capsule (8 mg total) by mouth daily. 90 capsule 3   Simethicone  180 MG CAPS Take 1 capsule (180 mg total) by mouth 3 (three) times daily as needed. 60 capsule 3   tadalafil  (CIALIS ) 5 MG tablet Take 1 tablet (5 mg total) by mouth daily. 30 tablet 11   tamsulosin  (FLOMAX ) 0.4 MG CAPS capsule Take 1 capsule (0.4 mg total) by mouth daily. 30 capsule 11   tirzepatide  (MOUNJARO ) 5 MG/0.5ML Pen Inject 5 mg into the skin once a week. 2 mL 3   triamterene -hydrochlorothiazide  (MAXZIDE ) 75-50 MG tablet Take 1/2 tablet by mouth daily. 45 tablet 1   No current facility-administered medications on file prior to visit.   "

## 2024-10-14 NOTE — Assessment & Plan Note (Signed)
 Lab Results  Component Value Date   HGBA1C 7.0 10/13/2024   HGBA1C 7.0 07/16/2024   HGBA1C 6.9 (H) 04/29/2024   A1c today is 7.0%. At the last visit, the patient reported hypoglycemia, and insulin  lispro was reduced from 4 units TID to 3 units TID. Review of CGM data today shows no hypoglycemia, with occasional hyperglycemia typically after lunch and dinner; overall, the patient is in range approximately 70% of the time. The patient was advised to eat dinner earlier--at least 3 hours before bedtime--and to reduce late-night snacking.No medication changes will be made today. Current regimen includes Tresiba  8 units nightly, insulin  lispro 3 units three times daily before meals, Mounjaro  5 mg weekly, and Jardiance  25 mg daily. Foot exam will be performed today. - Continue same regimen - Follow up in 3 months

## 2024-10-14 NOTE — Assessment & Plan Note (Signed)
 Last lipid panel from May 2025 demonstrated an LDL of 69 mg/dL. Patient remains on atorvastatin  40 mg daily; will continue monitoring.

## 2024-10-14 NOTE — Assessment & Plan Note (Signed)
 BP Readings from Last 3 Encounters:  10/13/24 127/70  09/16/24 117/73  07/16/24 125/70  At goal . Continue Maxzide  75-50 and losartan  50 mg.

## 2024-10-14 NOTE — Assessment & Plan Note (Signed)
 The patient reports minimal use of the medication. Will trial discontinuation and, if reflux symptoms do not recur over the next 2-3 weeks, will discontinue it completely.

## 2024-10-14 NOTE — Assessment & Plan Note (Signed)
 On Eliquis . Stable., Will continue to monitor

## 2024-10-15 NOTE — Progress Notes (Signed)
 Internal Medicine Clinic Attending  Case discussed with the resident at the time of the visit.  We reviewed the resident's history and exam and pertinent patient test results.  I agree with the assessment, diagnosis, and plan of care documented in the resident's note.

## 2024-10-17 ENCOUNTER — Other Ambulatory Visit (HOSPITAL_COMMUNITY): Payer: Self-pay

## 2024-10-21 ENCOUNTER — Other Ambulatory Visit (HOSPITAL_COMMUNITY): Payer: Self-pay

## 2024-10-23 ENCOUNTER — Other Ambulatory Visit: Payer: Self-pay | Admitting: Pharmacist

## 2024-10-23 MED ORDER — BICTEGRAVIR-EMTRICITAB-TENOFOV 50-200-25 MG PO TABS: 1.0000 | ORAL_TABLET | Freq: Every day | ORAL | Status: AC

## 2024-10-23 NOTE — Progress Notes (Signed)
 Medication Samples have been provided to the patient.  Drug name: Biktarvy         Strength: 50/200/25 mg       Qty: 14 tablets (2 bottles) LOT: [id number]   Exp.Date: 1/28  Samples requested by Alan Geralds, PharmD.  Dosing instructions: Take one tablet by mouth once daily  The patient has been instructed regarding the correct time, dose, and frequency of taking this medication, including desired effects and most common side effects.   Alan Geralds, PharmD, CPP, BCIDP, AAHIVP Clinical Pharmacist Practitioner Infectious Diseases Clinical Pharmacist [facility]

## 2024-10-30 ENCOUNTER — Other Ambulatory Visit: Payer: Self-pay | Admitting: Pharmacy Technician

## 2024-10-30 ENCOUNTER — Telehealth: Payer: Self-pay

## 2024-10-30 ENCOUNTER — Other Ambulatory Visit: Payer: Self-pay

## 2024-10-30 ENCOUNTER — Other Ambulatory Visit (HOSPITAL_COMMUNITY): Payer: Self-pay

## 2024-10-30 NOTE — Telephone Encounter (Signed)
 [facility] Patient Advocate Encounter   I was successful in securing patient a $5000.00 grant from [employer] ([employer]) to provide copayment coverage for Biktarvy.  This will make the out of pocket cost $0.00.     I have spoken with the patient.    The billing information is as follows and has been shared with [facility].         Patient knows to call the office with questions or concerns.  Clearance Coots, CPhT Specialty Pharmacy Patient [facility] Phone: [phone] Fax:  [phone]

## 2024-10-31 ENCOUNTER — Other Ambulatory Visit: Payer: Self-pay | Admitting: Pharmacy Technician

## 2024-10-31 ENCOUNTER — Other Ambulatory Visit: Payer: Self-pay

## 2024-10-31 NOTE — Progress Notes (Signed)
 Specialty Pharmacy Refill Coordination Note  Trevor Fischer is a 64 y.o. male contacted today regarding refills of specialty medication(s) Bictegravir-Emtricitab-Tenofov (BIKTARVY )   Patient requested Delivery   Delivery date: 11/05/24   Verified address: [address] [place]   Medication will be filled on: 11/04/24

## 2024-11-03 ENCOUNTER — Other Ambulatory Visit (HOSPITAL_COMMUNITY): Payer: Self-pay

## 2024-11-03 ENCOUNTER — Encounter (HOSPITAL_COMMUNITY): Payer: Self-pay | Admitting: Pharmacist

## 2024-11-03 ENCOUNTER — Telehealth (HOSPITAL_COMMUNITY): Payer: Self-pay | Admitting: Pharmacist

## 2024-11-03 ENCOUNTER — Other Ambulatory Visit: Payer: Self-pay

## 2024-11-03 ENCOUNTER — Telehealth (HOSPITAL_COMMUNITY): Payer: Self-pay

## 2024-11-03 NOTE — Telephone Encounter (Signed)
 PA request has been Received. New Encounter has been or will be created for follow up. For additional info see Pharmacy Prior Auth telephone encounter from 11/03/24.

## 2024-11-04 ENCOUNTER — Encounter (HOSPITAL_COMMUNITY): Payer: Self-pay

## 2024-11-04 ENCOUNTER — Other Ambulatory Visit (HOSPITAL_BASED_OUTPATIENT_CLINIC_OR_DEPARTMENT_OTHER): Payer: Self-pay

## 2024-11-04 ENCOUNTER — Other Ambulatory Visit (HOSPITAL_COMMUNITY): Payer: Self-pay

## 2024-11-04 NOTE — Telephone Encounter (Signed)
 Pharmacy Patient Advocate Encounter  Received notification from [employer] that Prior Authorization for  Dexcom G7 Sensor  has been APPROVED from 10/02/24 to 10/01/25. Ran test claim, Copay is $0. This test claim was processed through [facility]- copay amounts may vary at other pharmacies due to pharmacy/plan contracts, or as the patient moves through the different stages of their insurance plan.   PA #/Case ID/Reference #: [id number]

## 2024-11-04 NOTE — Telephone Encounter (Signed)
 Medication discontinued 05/28/24

## 2024-11-05 ENCOUNTER — Other Ambulatory Visit (HOSPITAL_COMMUNITY): Payer: Self-pay

## 2024-11-06 ENCOUNTER — Telehealth: Payer: Self-pay | Admitting: *Deleted

## 2024-11-06 ENCOUNTER — Other Ambulatory Visit (HOSPITAL_COMMUNITY): Payer: Self-pay

## 2024-11-06 DIAGNOSIS — E1140 Type 2 diabetes mellitus with diabetic neuropathy, unspecified: Secondary | ICD-10-CM

## 2024-11-06 MED ORDER — MOUNJARO 7.5 MG/0.5ML ~~LOC~~ SOAJ
7.5000 mg | SUBCUTANEOUS | 11 refills | Status: AC
  Filled 2024-11-06: qty 2, 28d supply, fill #0

## 2024-11-06 NOTE — Telephone Encounter (Signed)
 Will froward to PCP.                           Copied from CRM #[id number]. Topic: Clinical - Prescription Issue >> Nov 04, 2024  8:39 AM Trevor Fischer wrote: Reason for CRM: tirzepatide  (MOUNJARO ) 5 MG/0.5ML Pen, pt states prescription is due for increase, pharmacy states that they don't have a prescription for the 7.5 and would need that sent in, pt states next injection is due on Sunday. Please call Trevor Fischer and advise [phone]     ----------------------------------------------------------------------- From previous Reason for Contact - Medication Refill: Medication:   Has the patient contacted their pharmacy?   (Agent: If no, request that the patient contact the pharmacy for the refill. If patient does not wish to contact the pharmacy document the reason why and proceed with request.) (Agent: If yes, when and what did the pharmacy advise?)  This is the patient's preferred pharmacy:  [place] - [facility] [address] [place] [address] Phone: [phone] Fax: [phone]  [place] - [facility] [address] [place] [address] Phone: [phone] Fax: [phone]  [facility] - [facility] [address], [address] [place] [address] Phone: [phone] Fax: [phone]  Is this the correct pharmacy for this prescription?   If no, delete pharmacy and type the correct one.   Has the prescription been filled recently?    Is the patient out of the medication?    Has the patient been seen for an appointment in the last year OR does the patient have an upcoming appointment?    Can we respond through MyChart?    Agent: Please be advised that Rx refills may take up to 3 business days. We ask that you follow-up with your pharmacy. >> Nov 06, 2024 11:30 AM Miquel SAILOR wrote:  tirzepatide  (MOUNJARO ) 5 MG/0.5ML PEN-Note:pt states prescription is due for increase, pharmacy  states that they don't have a prescription for the 7.5 and would need that sent in, pt states next injection is due on Sunday. Please call Trevor Fischer and advise [phone]   PT called back to due to denied on wrong insurance updated insurance and needs this run this way Also call this number Prior Auth# [phone]

## 2024-11-06 NOTE — Telephone Encounter (Addendum)
 See previous note that was sent.   Copied from CRM #[id number]. Topic: Clinical - Prescription Issue >> Nov 04, 2024  8:39 AM Farrel B wrote: Reason for CRM: tirzepatide  (MOUNJARO ) 5 MG/0.5ML Pen, pt states prescription is due for increase, pharmacy states that they don't have a prescription for the 7.5 and would need that sent in, pt states next injection is due on Sunday. Please call Mr. Brune and advise [phone]     ----------------------------------------------------------------------- From previous Reason for Contact - Medication Refill: Medication:   Has the patient contacted their pharmacy?   (Agent: If no, request that the patient contact the pharmacy for the refill. If patient does not wish to contact the pharmacy document the reason why and proceed with request.) (Agent: If yes, when and what did the pharmacy advise?)  This is the patient's preferred pharmacy:  [place] - [facility] [address] [place] [address] Phone: [phone] Fax: [phone]  [place] - [facility] [address] [place] [address] Phone: [phone] Fax: [phone]  [facility] - [facility] [address] [place] [address] Phone: [phone] Fax: [phone]  Is this the correct pharmacy for this prescription?   If no, delete pharmacy and type the correct one.   Has the prescription been filled recently?    Is the patient out of the medication?    Has the patient been seen for an appointment in the last year OR does the patient have an upcoming appointment?    Can we respond through MyChart?    Agent: Please be advised that Rx refills may take up to 3 business days. We ask that you follow-up with your pharmacy. >> Nov 06, 2024 11:30 AM Miquel SAILOR wrote:  tirzepatide  (MOUNJARO ) 5 MG/0.5ML PEN-Note:pt states prescription is due for increase, pharmacy states that  they don't have a prescription for the 7.5 and would need that sent in, pt states next injection is due on Sunday. Please call Mr. Castilleja and advise [phone]   PT called back to due to denied on wrong insurance updated insurance and needs this run this way Also call this number Prior Auth# [phone]

## 2024-11-06 NOTE — Telephone Encounter (Signed)
 Copied from CRM #[id number]. Topic: Clinical - Prescription Issue >> Nov 04, 2024  8:39 AM Farrel B wrote: Reason for CRM: tirzepatide  (MOUNJARO ) 5 MG/0.5ML Pen, pt states prescription is due for increase, pharmacy states that they don't have a prescription for the 7.5 and would need that sent in, pt states next injection is due on Sunday. Please call Mr. Vantol and advise [phone]     ----------------------------------------------------------------------- From previous Reason for Contact - Medication Refill: Medication:   Has the patient contacted their pharmacy?   (Agent: If no, request that the patient contact the pharmacy for the refill. If patient does not wish to contact the pharmacy document the reason why and proceed with request.) (Agent: If yes, when and what did the pharmacy advise?)  This is the patient's preferred pharmacy:  [place] - [facility] [address], [address] [place] [address] Phone: [phone] Fax: [phone]  [place] - [facility] [address] [place] [address] Phone: [phone] Fax: [phone]  [facility] - [facility] [address] [place] [address] Phone: [phone] Fax: [phone]  Is this the correct pharmacy for this prescription?   If no, delete pharmacy and type the correct one.   Has the prescription been filled recently?    Is the patient out of the medication?    Has the patient been seen for an appointment in the last year OR does the patient have an upcoming appointment?    Can we respond through MyChart?    Agent: Please be advised that Rx refills may take up to 3 business days. We ask that you follow-up with your pharmacy. >> Nov 06, 2024 11:30 AM Miquel SAILOR wrote:  tirzepatide  (MOUNJARO ) 5 MG/0.5ML PEN-Note:pt states prescription is due for increase, pharmacy states that they don't have a prescription for  the 7.5 and would need that sent in, pt states next injection is due on Sunday. Please call Mr. Mccahill and advise [phone]   PT called back to due to denied on wrong insurance updated insurance and needs this run this way Also call this number Prior Auth# [phone]

## 2024-11-06 NOTE — Telephone Encounter (Signed)
 I just spoke with the patient and sent the medication in.

## 2025-01-12 ENCOUNTER — Ambulatory Visit: Payer: Self-pay

## 2025-07-13 ENCOUNTER — Ambulatory Visit: Payer: Self-pay | Admitting: Infectious Disease
